# Patient Record
Sex: Female | Born: 1940 | Race: White | Hispanic: Refuse to answer | Smoking: Former smoker
Health system: Southern US, Community
[De-identification: ages and names within clinical notes are randomized; demographics above are authoritative.]

## PROBLEM LIST (undated history)

## (undated) DIAGNOSIS — K436 Other and unspecified ventral hernia with obstruction, without gangrene: Secondary | ICD-10-CM

## (undated) DIAGNOSIS — C801 Malignant (primary) neoplasm, unspecified: Secondary | ICD-10-CM

## (undated) DIAGNOSIS — Z87891 Personal history of nicotine dependence: Secondary | ICD-10-CM

## (undated) DIAGNOSIS — E785 Hyperlipidemia, unspecified: Secondary | ICD-10-CM

## (undated) DIAGNOSIS — K9419 Other complications of enterostomy: Secondary | ICD-10-CM

## (undated) DIAGNOSIS — Z8551 Personal history of malignant neoplasm of bladder: Secondary | ICD-10-CM

## (undated) DIAGNOSIS — K259 Gastric ulcer, unspecified as acute or chronic, without hemorrhage or perforation: Secondary | ICD-10-CM

## (undated) DIAGNOSIS — I10 Essential (primary) hypertension: Secondary | ICD-10-CM

## (undated) HISTORY — DX: Essential (primary) hypertension: I10

## (undated) HISTORY — PX: REVISION UROSTOMY CUTANEOUS: SUR1282

## (undated) HISTORY — DX: Hyperlipidemia, unspecified: E78.5

## (undated) HISTORY — DX: Other and unspecified ventral hernia with obstruction, without gangrene: K43.6

## (undated) HISTORY — DX: Other complications of enterostomy: K94.19

---

## 1993-10-18 HISTORY — PX: BLADDER REMOVAL: SHX567

## 2000-09-19 ENCOUNTER — Encounter: Admission: RE | Admit: 2000-09-19 | Discharge: 2000-09-19 | Payer: Self-pay | Admitting: Urology

## 2000-09-19 ENCOUNTER — Encounter: Payer: Self-pay | Admitting: Urology

## 2003-01-08 ENCOUNTER — Encounter: Payer: Self-pay | Admitting: Urology

## 2003-01-08 ENCOUNTER — Encounter: Admission: RE | Admit: 2003-01-08 | Discharge: 2003-01-08 | Payer: Self-pay | Admitting: Urology

## 2005-08-02 ENCOUNTER — Encounter: Admission: RE | Admit: 2005-08-02 | Discharge: 2005-08-02 | Payer: Self-pay | Admitting: Internal Medicine

## 2005-08-02 IMAGING — US US SOFT TISSUE HEAD/NECK
1 series · 14 of 25 positions shown · non-contrast
Comparison: none

CLINICAL DATA: Left thyroid nodule on exam.
 ULTRASOUND SOFT TISSUE HEAD AND NECK:
 Scans over the thyroid gland were performed.  The right lobe of thyroid measures 5.4 x 1.2 x 1.3 cm with the left lobe measuring 5.4 x 2.3 x 2.2 cm.  The isthmus measures 4 mm in thickness.  The thyroid gland is inhomogeneous diffusely and there are multiple, primarily solid lesions present.  The dominant lesion is in the mid left lobe measuring 2.5 x 2.2 x 2.0 cm. This lesion is echogenic, and biopsy is recommended.  Several smaller, more complex nodules are present bilaterally, one of the largest in the lateral left lobe measuring 1.4 x 0.9 x 0.6 cm.

[Series 1: unknown · 0.09mm/px · 14 of 62 slices shown]
[im 1/62]
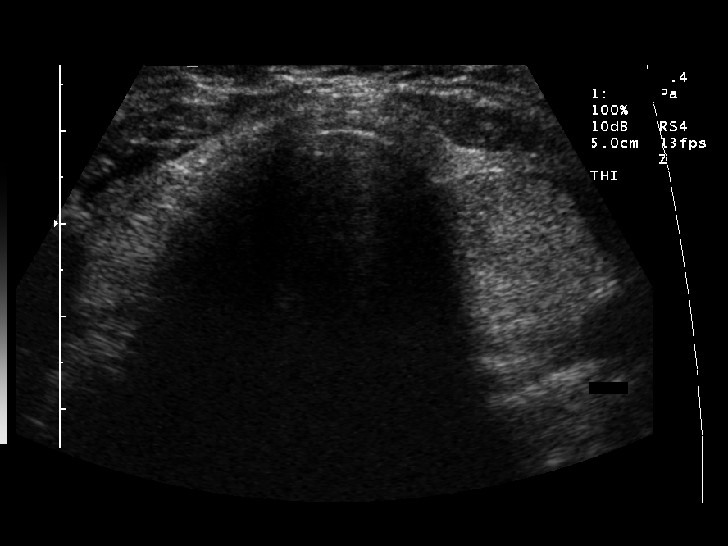
[im 6/62]
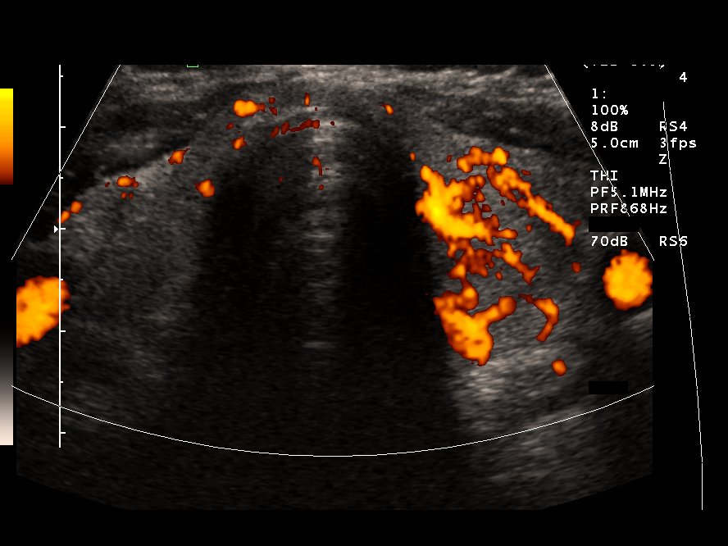
[im 11/62]
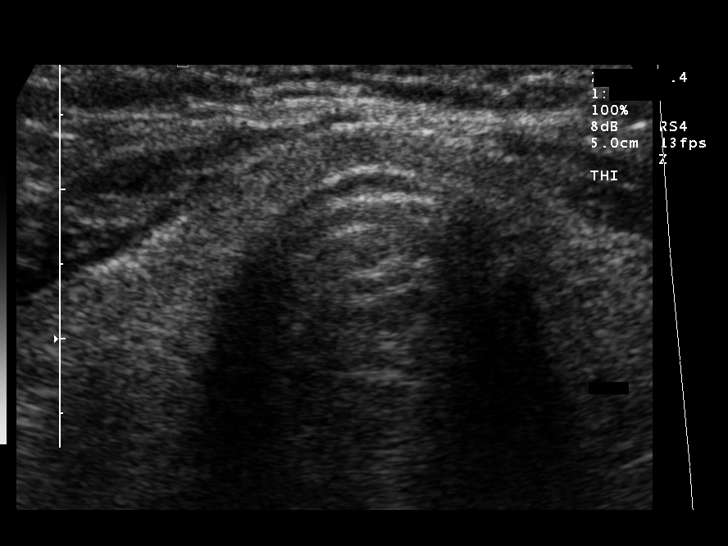
[im 16/62]
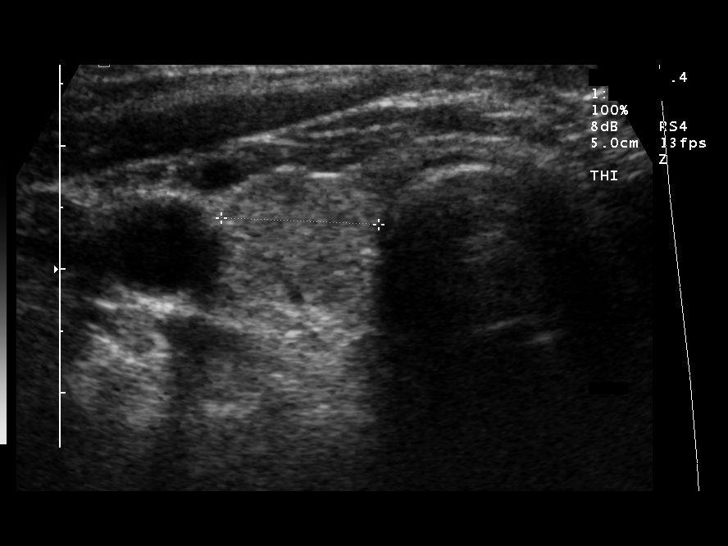
[im 21/62]
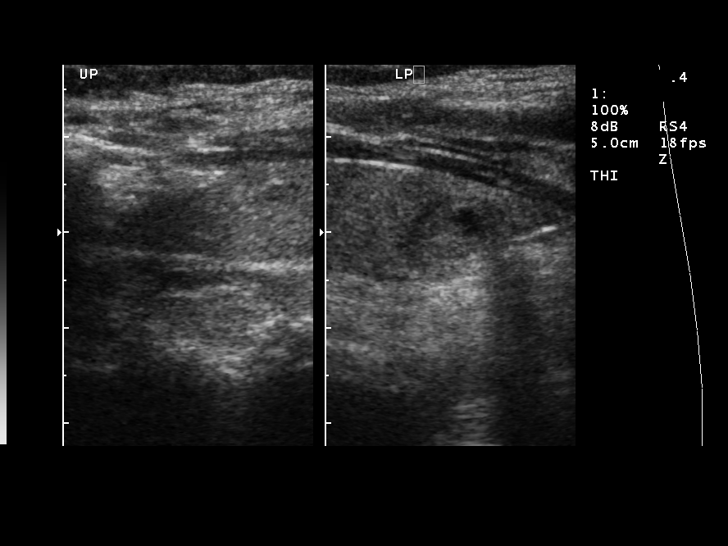
[im 23/62]
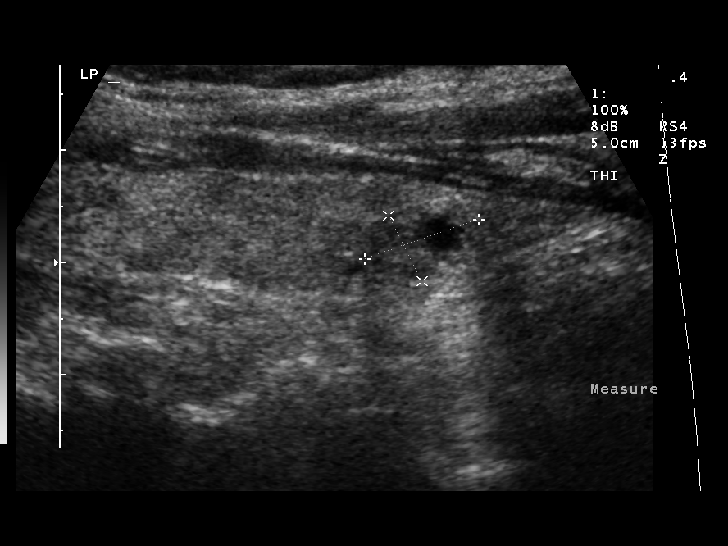
[im 28/62]
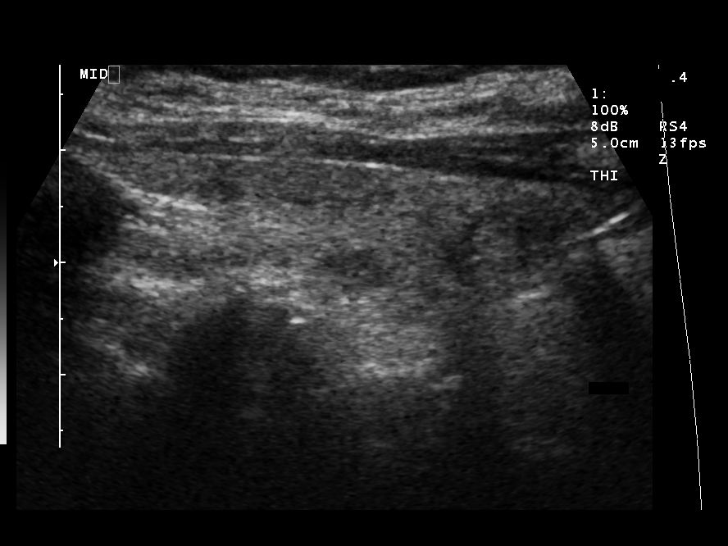
[im 34/62]
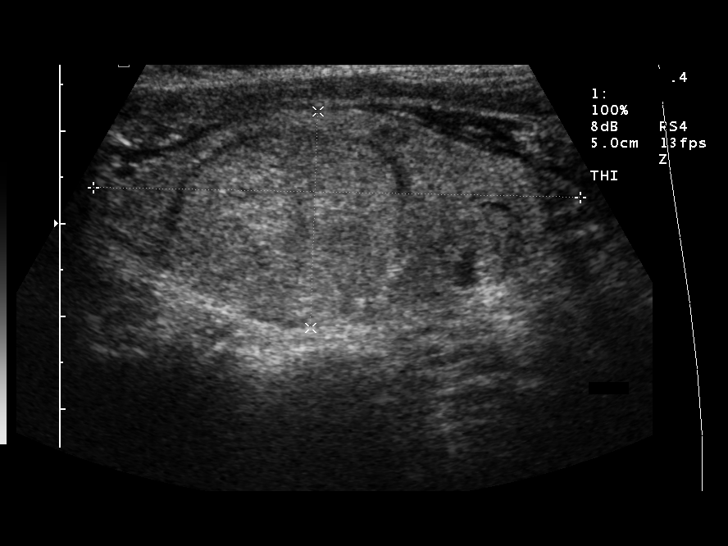
[im 39/62]
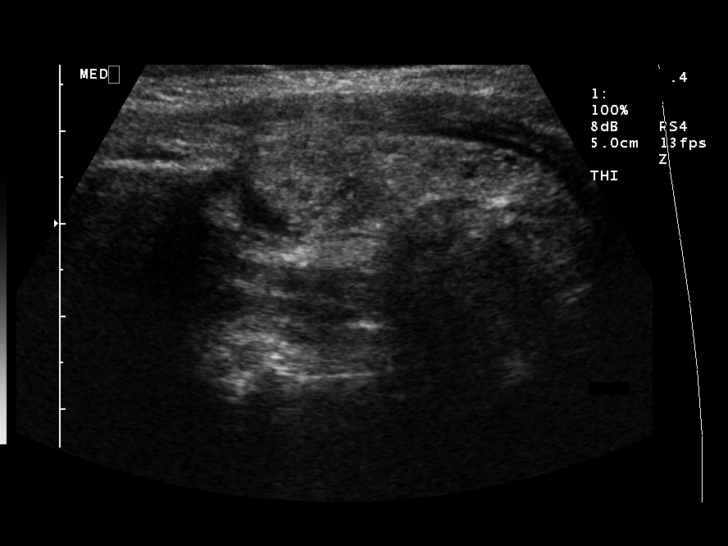
[im 41/62]
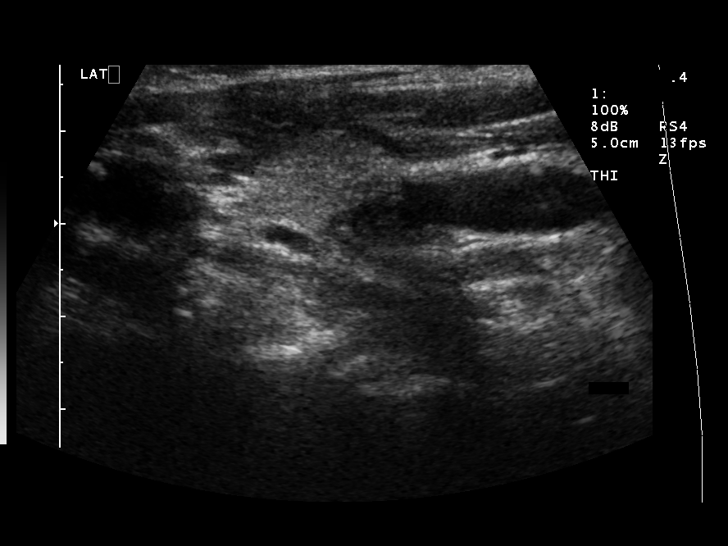
[im 46/62]
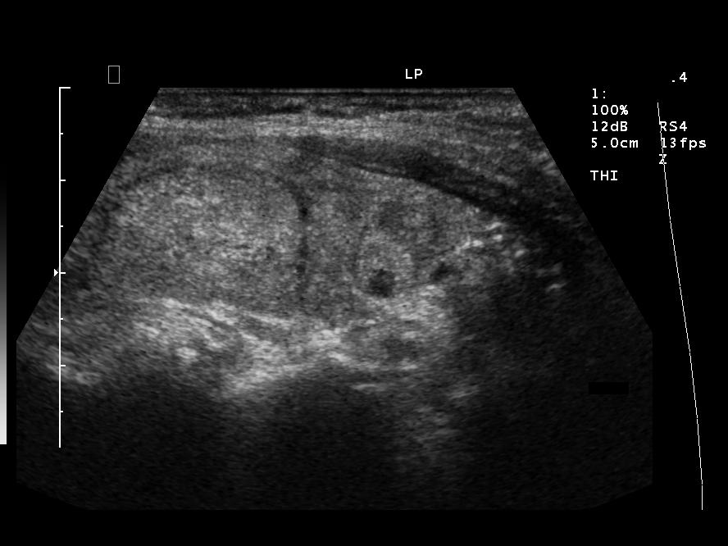
[im 51/62]
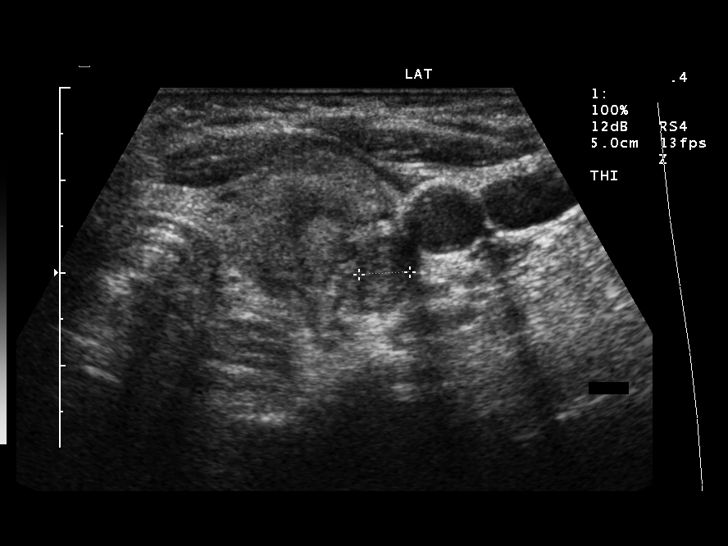
[im 56/62]
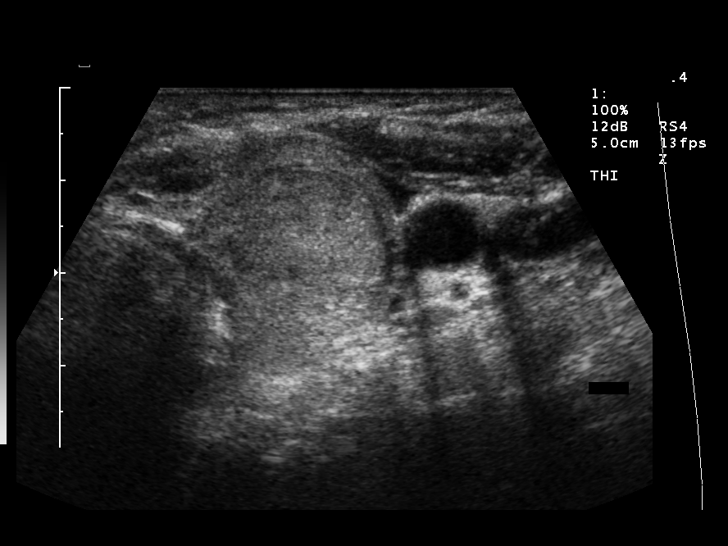
[im 62/62]
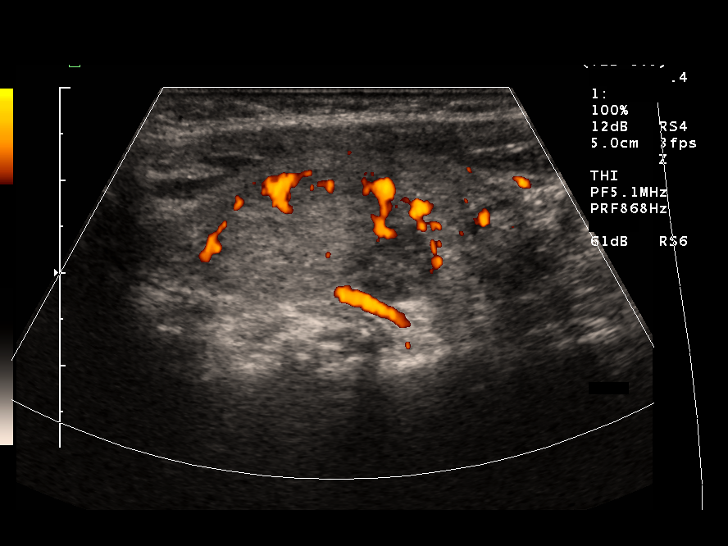

[14 of 25 positions shown; findings below may reference images not displayed]

IMPRESSION: 1.  Multiple thyroid nodules, the most dominant is solid in the left lobe measuring 2.5 x 2.2 x 2.0 cm.  Suggest biopsy of this dominant lesion.

## 2005-09-04 ENCOUNTER — Other Ambulatory Visit: Admission: RE | Admit: 2005-09-04 | Discharge: 2005-09-04 | Payer: Self-pay | Admitting: Interventional Radiology

## 2005-09-04 ENCOUNTER — Encounter: Admission: RE | Admit: 2005-09-04 | Discharge: 2005-09-04 | Payer: Self-pay | Admitting: Internal Medicine

## 2005-09-04 IMAGING — US US BIOPSY
1 series · 8 of 8 positions shown · non-contrast
Comparison: none

ULTRASOUND GUIDED FINE NEEDLE ASPIRATION OF DOMINANT LEFT THYROID LOBE NODULE:

[Series 1: unknown · 0.06mm/px · 8 of 8 slices shown]
[im 1/8]
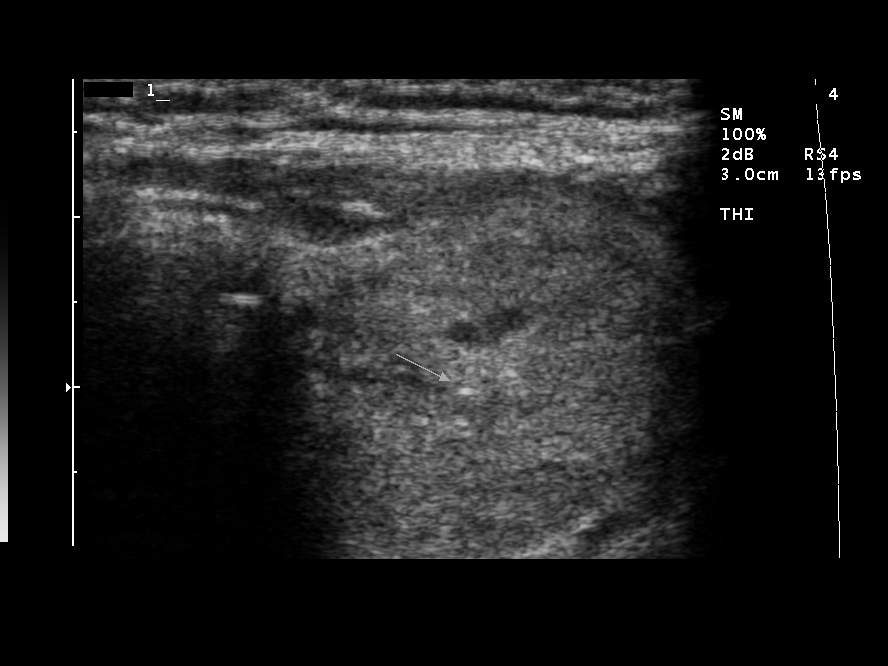
[im 2/8]
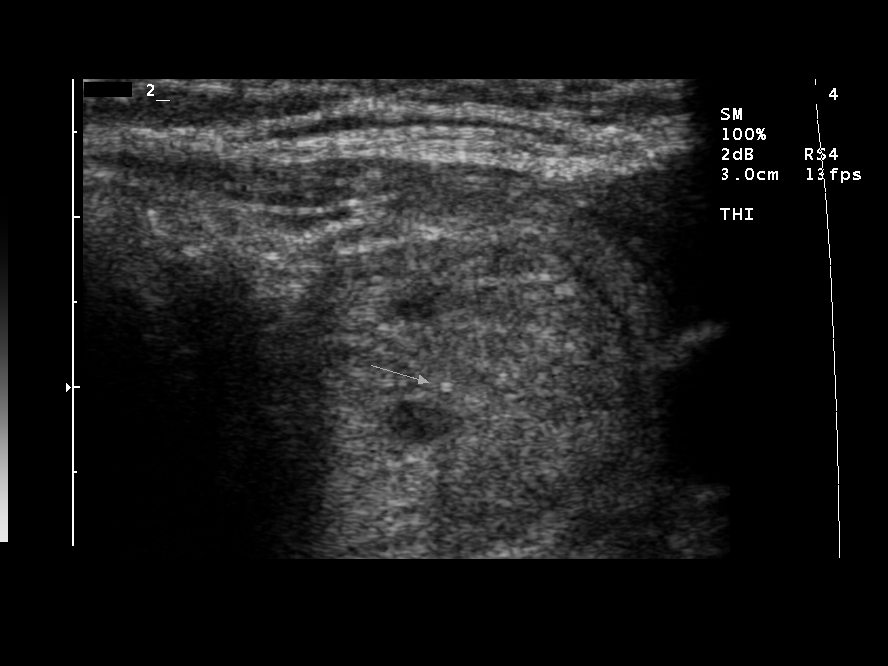
[im 3/8]
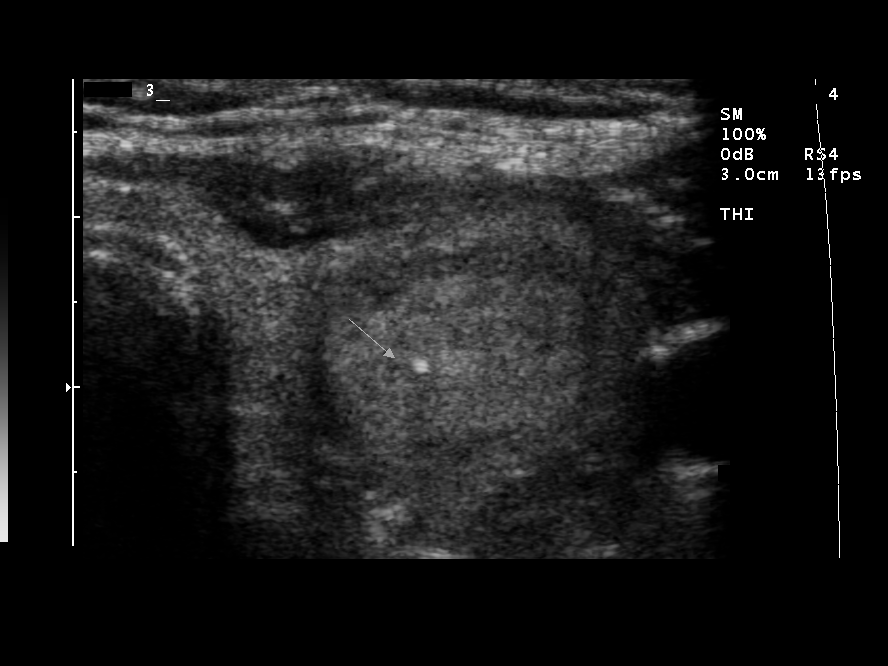
[im 4/8]
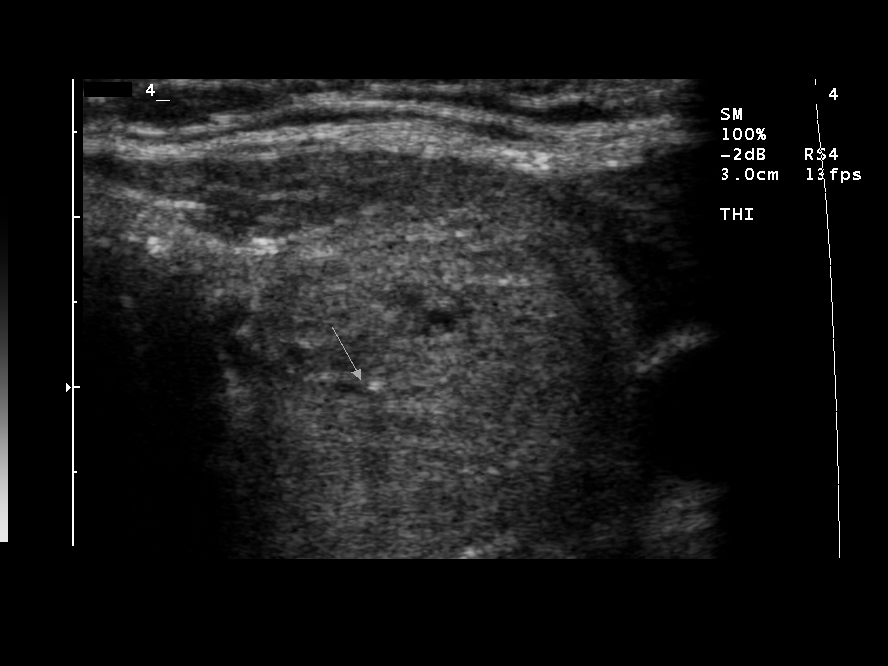
[im 5/8]
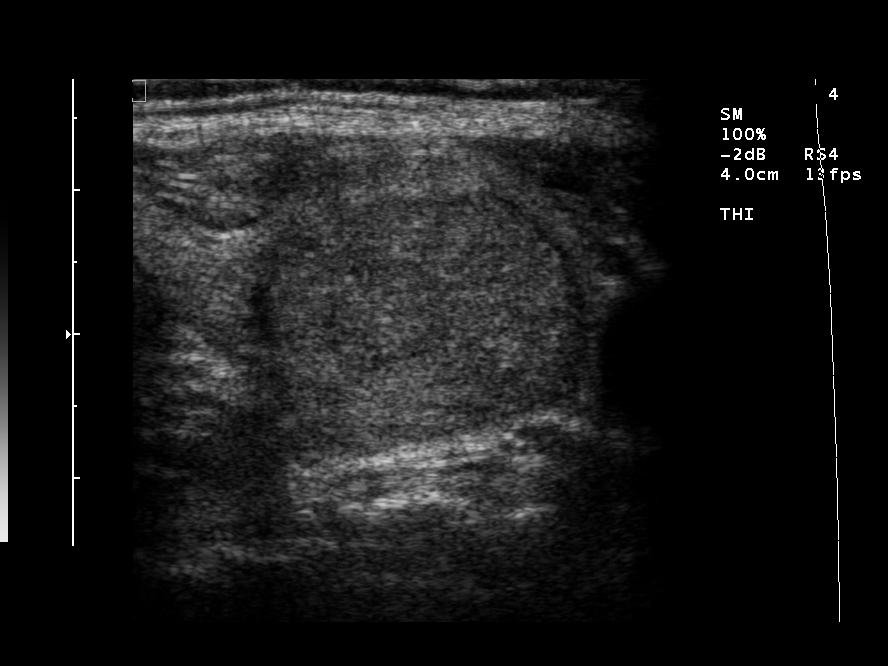
[im 6/8]
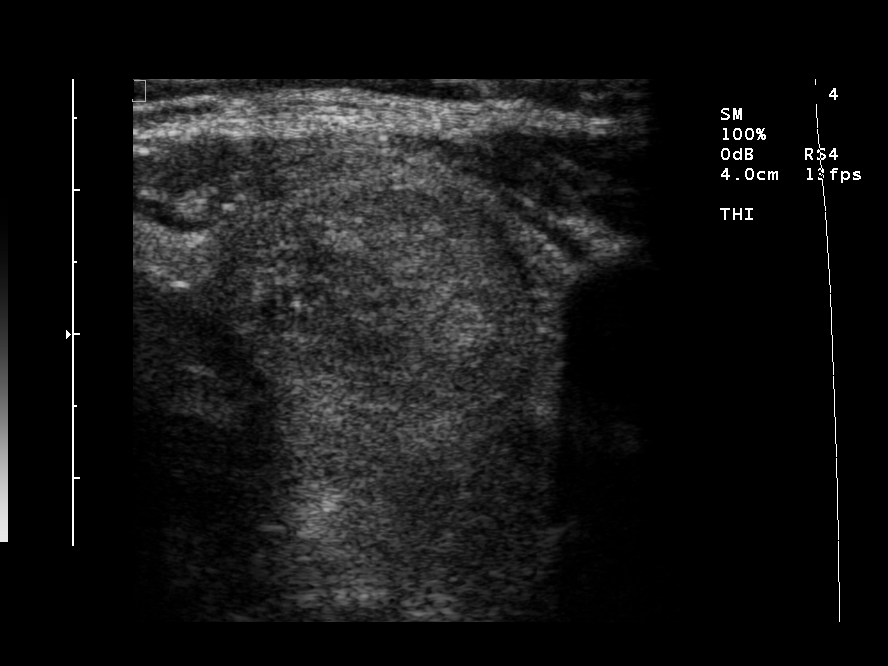
[im 7/8]
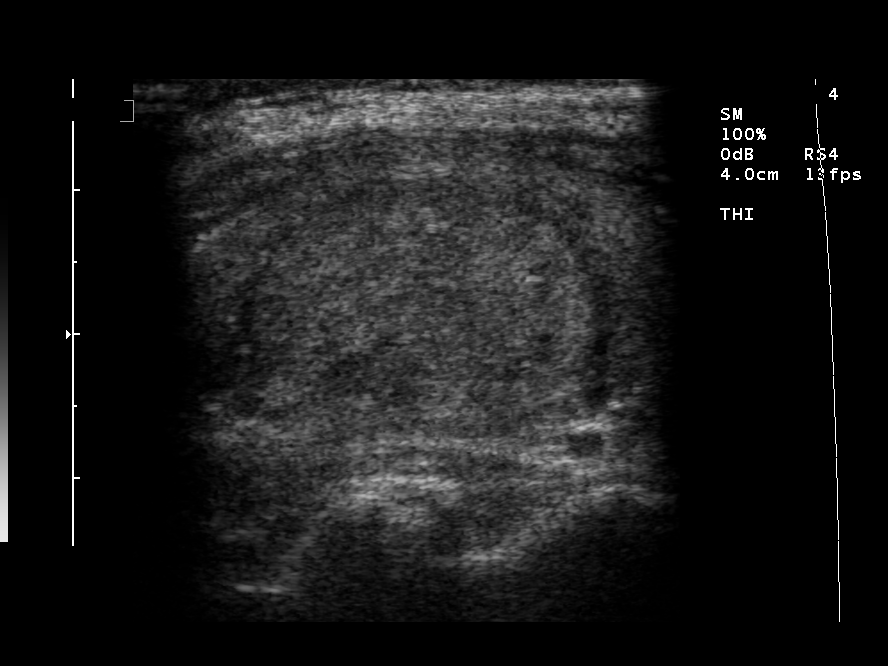
[im 8/8]
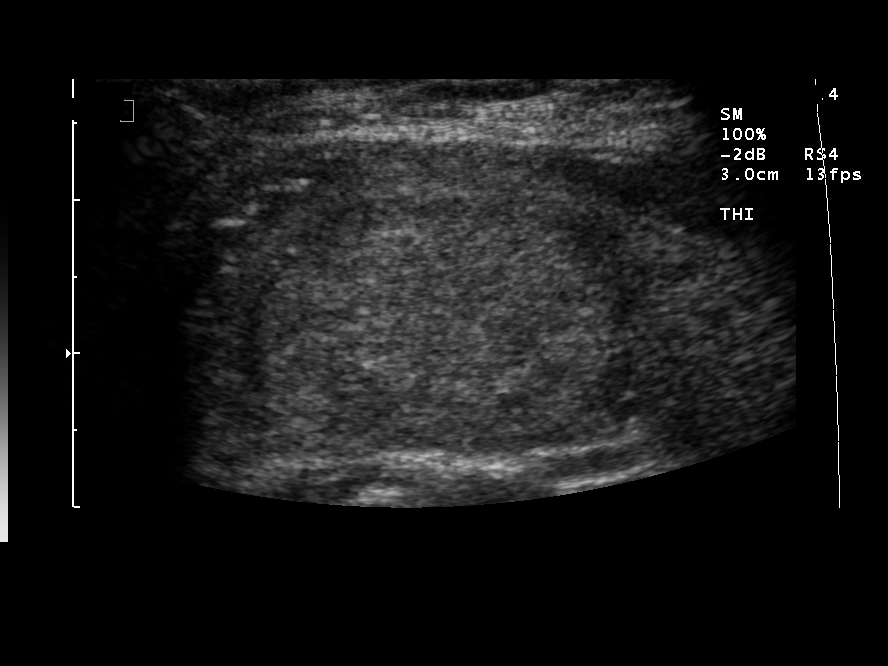

[8 of 8 positions shown; findings below may reference images not displayed]

FINDINGS: An ultrasound guided thyroid biopsy was thoroughly discussed with the patient and questions were answered.  The risks and benefits of the procedure were also delineated.  Risks specifically discussed included bleeding, bruising, infection, and risk of injury to adjacent blood vessels and nerves.  The patient understands and wishes to proceed.  Verbal and written consent was obtained.  
After the patient was prepped and draped in a normal sterile fashion ,1 percent Lidocaine was used for local anesthesia.  Under direct ultrasound guidance ,four passes were then made using 25 gauge hypodermic needles into the dominant left midpole thyroid lobe nodule.  Ultrasound imaging confirmed appropriate needle placement in the nodule.  The specimens were sent to cytology for further analysis.  The patient tolerated the procedure well.  There were no immediate complications.  No hematoma was identified postprocedure.
IMPRESSION: Successful ultrasound guided fine needle aspiration of a dominant left midpole thyroid lobe nodule as discussed above.

## 2011-12-04 ENCOUNTER — Encounter (HOSPITAL_COMMUNITY): Payer: Self-pay | Admitting: *Deleted

## 2011-12-04 ENCOUNTER — Inpatient Hospital Stay (HOSPITAL_COMMUNITY)
Admission: EM | Admit: 2011-12-04 | Discharge: 2011-12-11 | DRG: 348 | Disposition: A | Discharge status: Home / Self Care | Payer: Medicare Other | Attending: Surgery | Admitting: Surgery

## 2011-12-04 ENCOUNTER — Emergency Department (HOSPITAL_COMMUNITY): Payer: Medicare Other

## 2011-12-04 ENCOUNTER — Ambulatory Visit (INDEPENDENT_AMBULATORY_CARE_PROVIDER_SITE_OTHER): Payer: Medicare Other

## 2011-12-04 ENCOUNTER — Encounter: Payer: Self-pay | Admitting: Emergency Medicine

## 2011-12-04 ENCOUNTER — Encounter (INDEPENDENT_AMBULATORY_CARE_PROVIDER_SITE_OTHER): Payer: Self-pay | Admitting: Surgery

## 2011-12-04 DIAGNOSIS — K56609 Unspecified intestinal obstruction, unspecified as to partial versus complete obstruction: Secondary | ICD-10-CM | POA: Diagnosis present

## 2011-12-04 DIAGNOSIS — R112 Nausea with vomiting, unspecified: Secondary | ICD-10-CM | POA: Diagnosis present

## 2011-12-04 DIAGNOSIS — N289 Disorder of kidney and ureter, unspecified: Secondary | ICD-10-CM

## 2011-12-04 DIAGNOSIS — K660 Peritoneal adhesions (postprocedural) (postinfection): Secondary | ICD-10-CM

## 2011-12-04 DIAGNOSIS — I10 Essential (primary) hypertension: Secondary | ICD-10-CM | POA: Diagnosis present

## 2011-12-04 DIAGNOSIS — K560 Paralytic ileus: Secondary | ICD-10-CM | POA: Diagnosis not present

## 2011-12-04 DIAGNOSIS — Z906 Acquired absence of other parts of urinary tract: Secondary | ICD-10-CM

## 2011-12-04 DIAGNOSIS — K9419 Other complications of enterostomy: Secondary | ICD-10-CM | POA: Diagnosis present

## 2011-12-04 DIAGNOSIS — Z8551 Personal history of malignant neoplasm of bladder: Secondary | ICD-10-CM | POA: Insufficient documentation

## 2011-12-04 DIAGNOSIS — Z8711 Personal history of peptic ulcer disease: Secondary | ICD-10-CM

## 2011-12-04 DIAGNOSIS — E669 Obesity, unspecified: Secondary | ICD-10-CM | POA: Diagnosis present

## 2011-12-04 DIAGNOSIS — IMO0002 Reserved for concepts with insufficient information to code with codable children: Principal | ICD-10-CM | POA: Diagnosis present

## 2011-12-04 DIAGNOSIS — K430 Incisional hernia with obstruction, without gangrene: Secondary | ICD-10-CM | POA: Diagnosis present

## 2011-12-04 DIAGNOSIS — E860 Dehydration: Secondary | ICD-10-CM

## 2011-12-04 DIAGNOSIS — K435 Parastomal hernia without obstruction or  gangrene: Secondary | ICD-10-CM | POA: Diagnosis present

## 2011-12-04 DIAGNOSIS — K259 Gastric ulcer, unspecified as acute or chronic, without hemorrhage or perforation: Secondary | ICD-10-CM

## 2011-12-04 DIAGNOSIS — K436 Other and unspecified ventral hernia with obstruction, without gangrene: Secondary | ICD-10-CM | POA: Diagnosis present

## 2011-12-04 HISTORY — DX: Parastomal hernia without obstruction or gangrene: K43.5

## 2011-12-04 HISTORY — DX: Malignant (primary) neoplasm, unspecified: C80.1

## 2011-12-04 HISTORY — DX: Personal history of malignant neoplasm of bladder: Z85.51

## 2011-12-04 HISTORY — DX: Gastric ulcer, unspecified as acute or chronic, without hemorrhage or perforation: K25.9

## 2011-12-04 HISTORY — DX: Personal history of nicotine dependence: Z87.891

## 2011-12-04 LAB — URINALYSIS, ROUTINE W REFLEX MICROSCOPIC
Ketones, ur: 15 mg/dL — AB
Nitrite: NEGATIVE
Protein, ur: 30 mg/dL — AB
Urobilinogen, UA: 1 mg/dL (ref 0.0–1.0)
pH: 7.5 (ref 5.0–8.0)

## 2011-12-04 LAB — DIFFERENTIAL
Basophils Relative: 0 % (ref 0–1)
Eosinophils Absolute: 0.1 10*3/uL (ref 0.0–0.7)
Monocytes Absolute: 0.6 10*3/uL (ref 0.1–1.0)
Neutrophils Relative %: 84 % — ABNORMAL HIGH (ref 43–77)

## 2011-12-04 LAB — POCT I-STAT, CHEM 8
Calcium, Ion: 1.19 mmol/L (ref 1.12–1.32)
HCT: 46 % (ref 36.0–46.0)
TCO2: 32 mmol/L (ref 0–100)

## 2011-12-04 LAB — CBC
Hemoglobin: 13.8 g/dL (ref 12.0–15.0)
MCH: 29.2 pg (ref 26.0–34.0)
MCHC: 32.7 g/dL (ref 30.0–36.0)

## 2011-12-04 LAB — URINE MICROSCOPIC-ADD ON

## 2011-12-04 IMAGING — CT CT ABD-PELV W/O CM
2 of 4 series · 17 of 46 positions shown, 19 images · non-contrast
Comparison: Plain films [DATE]

CLINICAL DATA: Nausea, vomiting.  History of bladder cancer and
cystectomy.

CT ABDOMEN AND PELVIS WITHOUT CONTRAST
TECHNIQUE: Multidetector CT imaging of the abdomen and pelvis was
performed following the standard protocol without intravenous
contrast.

[Series 2: abd/pel w/o · axial · non-contrast · 0.74mm/px · z∈[-479,-84]mm · 14 of 87 slices shown, 16 images]
[im 4/87  soft-tissue]
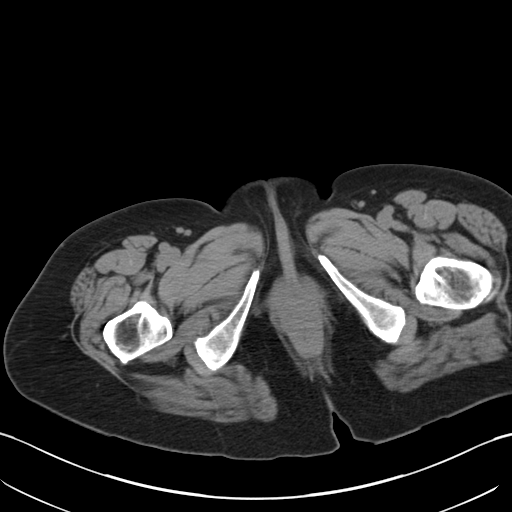
[im 4/87  bone]
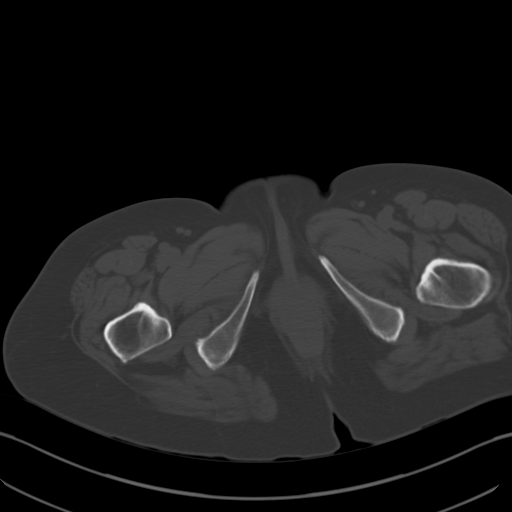
[im 11/87  soft-tissue]
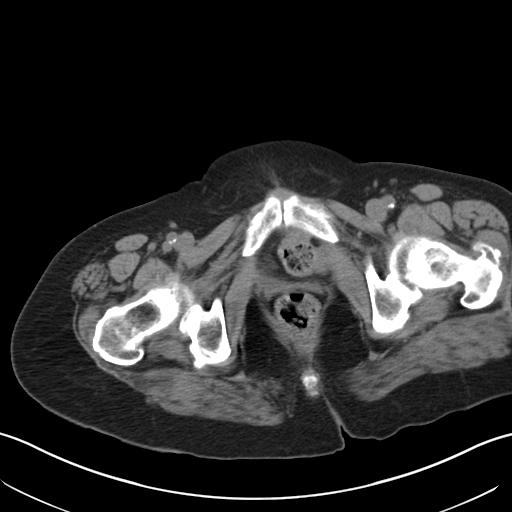
[im 18/87  soft-tissue]
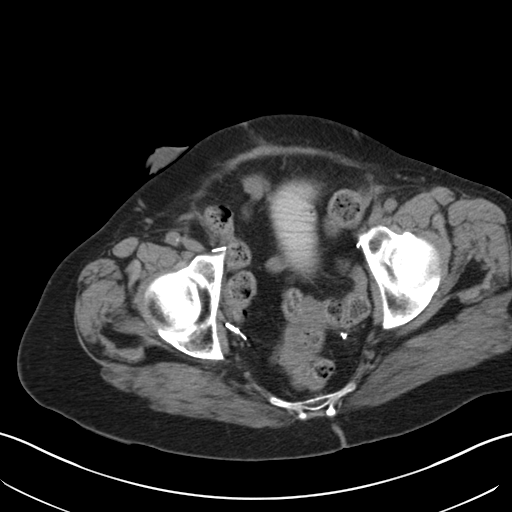
[im 25/87  soft-tissue]
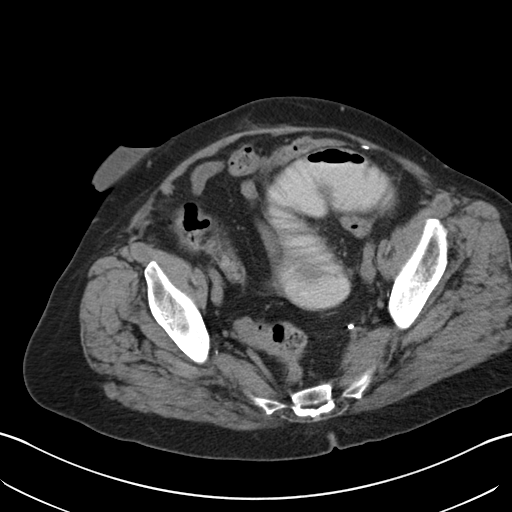
[im 28/87  soft-tissue]
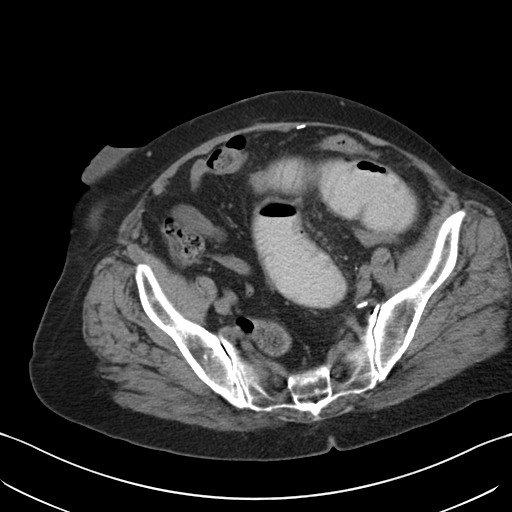
[im 35/87  soft-tissue]
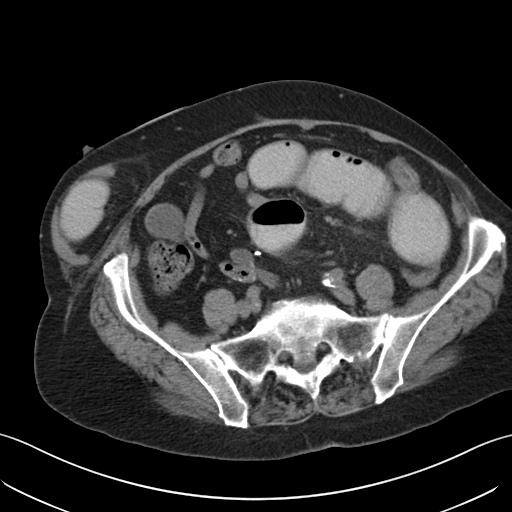
[im 42/87  soft-tissue]
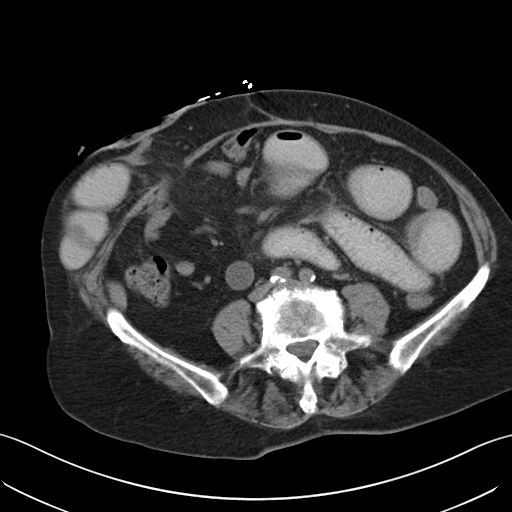
[im 45/87  soft-tissue]
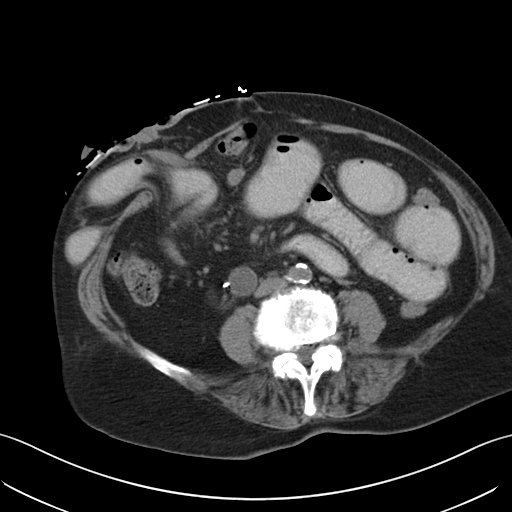
[im 52/87  soft-tissue]
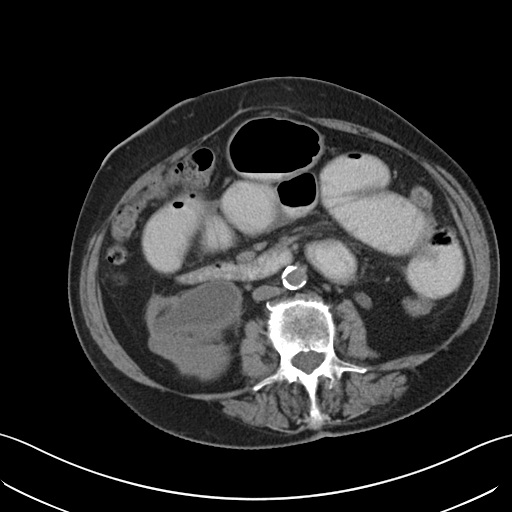
[im 52/87  bone]
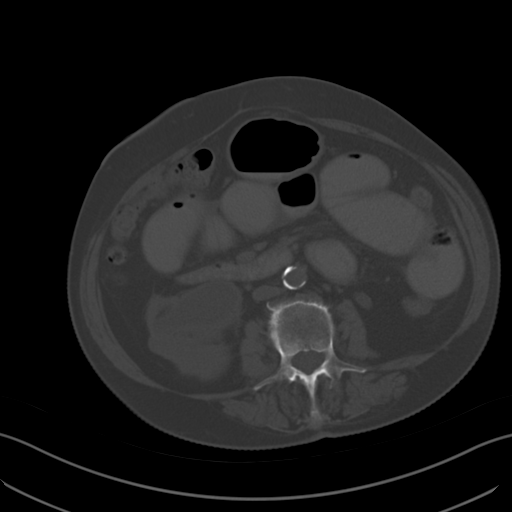
[im 59/87  soft-tissue]
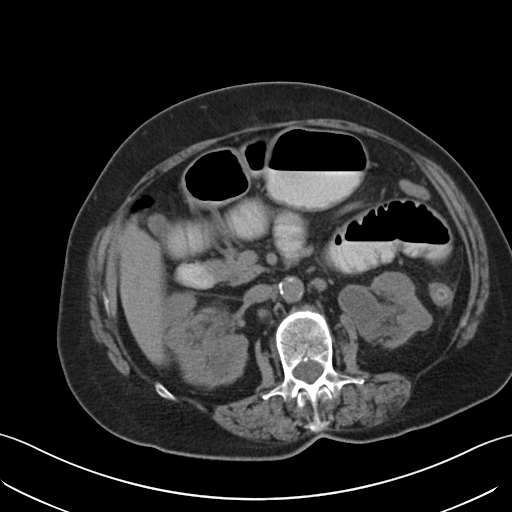
[im 66/87  soft-tissue]
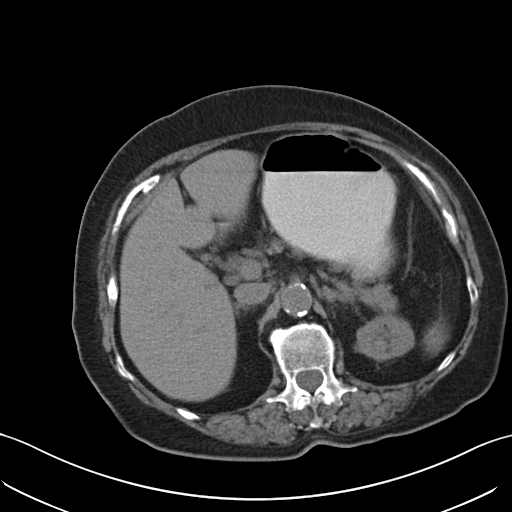
[im 69/87  soft-tissue]
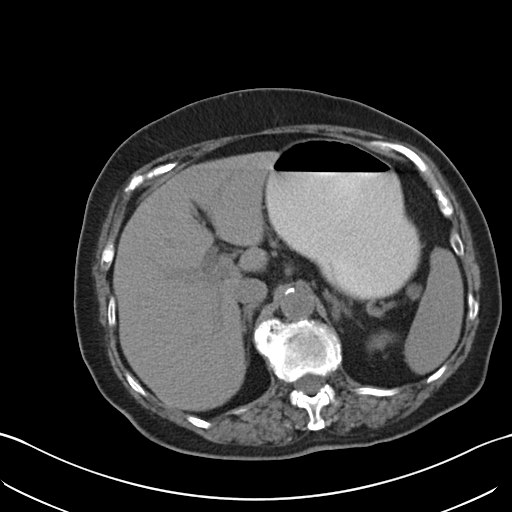
[im 76/87  soft-tissue]
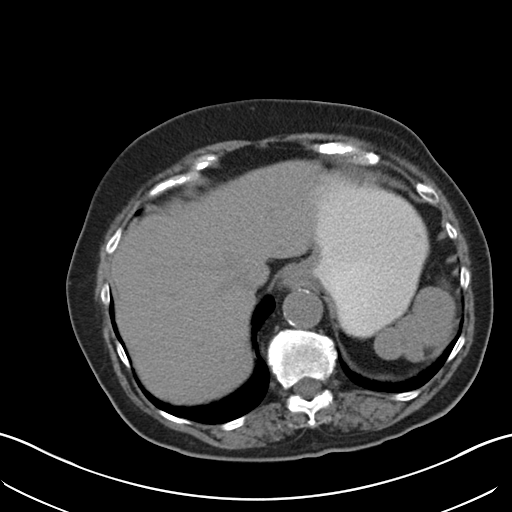
[im 83/87  soft-tissue]
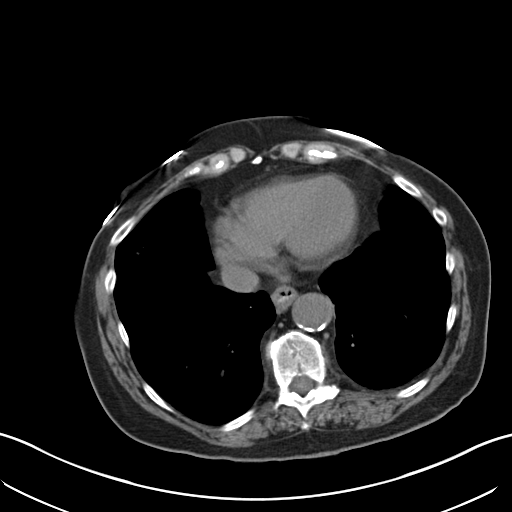

[Series 5: coronal · coronal · 0.74mm/px · 3 of 51 slices shown]
[im 17/51  soft-tissue]
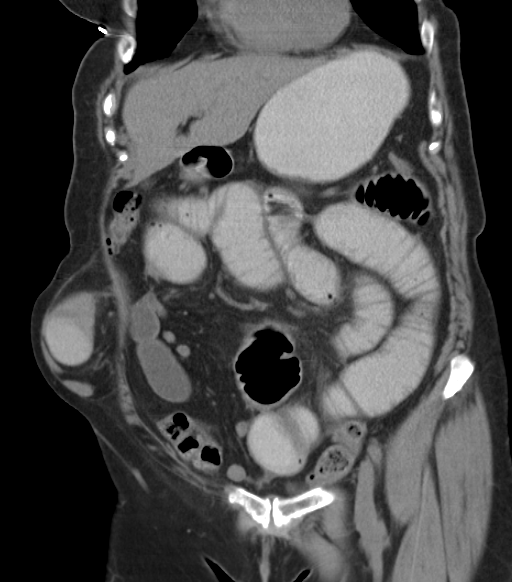
[im 23/51  soft-tissue]
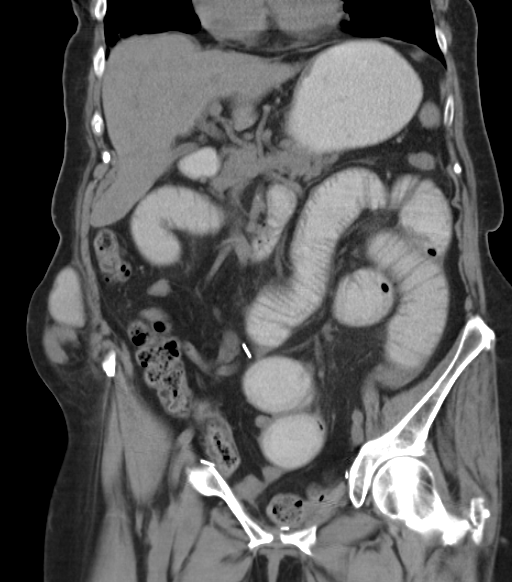
[im 28/51  soft-tissue]
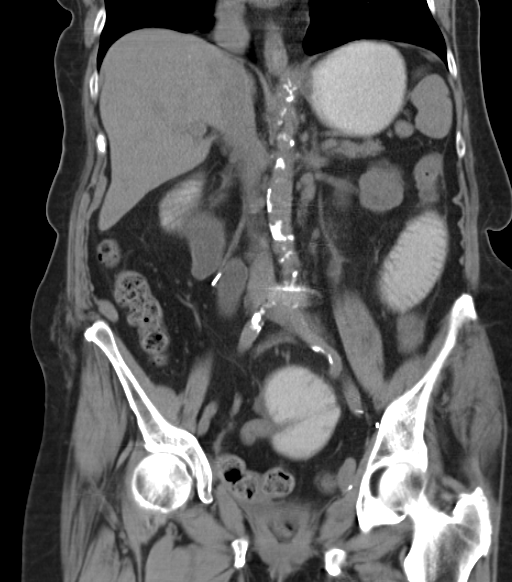

[17 of 46 positions shown; findings below may reference images not displayed]

FINDINGS: Ground-glass opacities noted in the right lung base in
both the right middle lobe and right lower lobe.  I suspect this
represents scarring or atelectasis.  No effusions.  Heart is normal
size.

The patient is status post prior cystectomy.  There is a right
lower quadrant ileal conduit.  Parastomal herniation of small bowel
loops through the anterior abdominal wall defect for the ileal
conduit. Small bowel loops in the abdomen and pelvis are dilated
with decompressed distal small bowel.  The caliber change appears
to be within the parastomal hernia.  Findings compatible with small
bowel obstruction.

Liver, spleen, pancreas, adrenals are unremarkable.  Gallbladder is
contracted.  The kidneys are deformed, likely related to chronic
reflux following cystectomy and ileal conduit formation.  Stomach
and large bowel are unremarkable.
IMPRESSION: Small bowel obstruction.  There is a parastomal hernia in the right
lower quadrant through the anterior abdominal wall defect for the
ileal conduit.  Caliber change within the small bowel loops noted
within this hernia sac.

Prior cystectomy.

## 2011-12-04 MED ORDER — PANTOPRAZOLE SODIUM 40 MG IV SOLR
40.0000 mg | Freq: Two times a day (BID) | INTRAVENOUS | Status: DC
  Administered 2011-12-05 – 2011-12-09 (×10): 40 mg via INTRAVENOUS
  Filled 2011-12-04 (×12): qty 40

## 2011-12-04 MED ORDER — SODIUM CHLORIDE 0.9 % IV SOLN
1.0000 g | INTRAVENOUS | Status: DC
  Administered 2011-12-05 – 2011-12-08 (×4): 1 g via INTRAVENOUS
  Filled 2011-12-04 (×5): qty 1

## 2011-12-04 MED ORDER — GUAIFENESIN-DM 100-10 MG/5ML PO SYRP: 15.0000 mL | ORAL_SOLUTION | ORAL | Status: DC | PRN

## 2011-12-04 MED ORDER — MENTHOL 3 MG MT LOZG
1.0000 | LOZENGE | OROMUCOSAL | Status: DC | PRN
  Filled 2011-12-04: qty 9

## 2011-12-04 MED ORDER — LACTATED RINGERS IV BOLUS (SEPSIS): 1000.0000 mL | Freq: Four times a day (QID) | INTRAVENOUS | Status: AC | PRN

## 2011-12-04 MED ORDER — MAGIC MOUTHWASH
15.0000 mL | Freq: Four times a day (QID) | ORAL | Status: DC | PRN
  Filled 2011-12-04: qty 15

## 2011-12-04 MED ORDER — ONDANSETRON HCL 4 MG/2ML IJ SOLN
4.0000 mg | Freq: Once | INTRAMUSCULAR | Status: AC
  Administered 2011-12-04: 4 mg via INTRAVENOUS
  Filled 2011-12-04: qty 2

## 2011-12-04 MED ORDER — WITCH HAZEL-GLYCERIN EX PADS
1.0000 "application " | MEDICATED_PAD | CUTANEOUS | Status: DC | PRN
  Filled 2011-12-04: qty 100

## 2011-12-04 MED ORDER — METOPROLOL TARTRATE 1 MG/ML IV SOLN
5.0000 mg | Freq: Four times a day (QID) | INTRAVENOUS | Status: DC | PRN
  Filled 2011-12-04: qty 5

## 2011-12-04 MED ORDER — HYDROMORPHONE HCL PF 1 MG/ML IJ SOLN
0.5000 mg | INTRAMUSCULAR | Status: DC | PRN
  Administered 2011-12-05: 2 mg via INTRAVENOUS
  Administered 2011-12-05 (×5): 1 mg via INTRAVENOUS
  Administered 2011-12-06: 0.5 mg via INTRAVENOUS
  Administered 2011-12-06: 1 mg via INTRAVENOUS
  Administered 2011-12-06: 0.5 mg via INTRAVENOUS
  Administered 2011-12-06: 1 mg via INTRAVENOUS
  Administered 2011-12-06 – 2011-12-07 (×2): 0.5 mg via INTRAVENOUS
  Administered 2011-12-07 (×3): 1 mg via INTRAVENOUS
  Filled 2011-12-04 (×5): qty 1
  Filled 2011-12-04: qty 2
  Filled 2011-12-04 (×8): qty 1

## 2011-12-04 MED ORDER — SODIUM CHLORIDE 0.9 % IV SOLN: INTRAVENOUS | Status: DC

## 2011-12-04 MED ORDER — DEXTROSE IN LACTATED RINGERS 5 % IV SOLN
INTRAVENOUS | Status: DC
  Administered 2011-12-05 – 2011-12-06 (×3): via INTRAVENOUS

## 2011-12-04 MED ORDER — ACETAMINOPHEN 650 MG RE SUPP: 650.0000 mg | Freq: Four times a day (QID) | RECTAL | Status: DC | PRN

## 2011-12-04 MED ORDER — SODIUM CHLORIDE 0.9 % IV BOLUS (SEPSIS)
500.0000 mL | Freq: Once | INTRAVENOUS | Status: AC
  Administered 2011-12-04: 500 mL via INTRAVENOUS

## 2011-12-04 MED ORDER — DIPHENHYDRAMINE HCL 50 MG/ML IJ SOLN: 12.5000 mg | Freq: Four times a day (QID) | INTRAMUSCULAR | Status: DC | PRN

## 2011-12-04 MED ORDER — ALBUTEROL SULFATE (5 MG/ML) 0.5% IN NEBU: 2.5000 mg | INHALATION_SOLUTION | Freq: Four times a day (QID) | RESPIRATORY_TRACT | Status: DC | PRN

## 2011-12-04 MED ORDER — PROMETHAZINE HCL 25 MG/ML IJ SOLN
12.5000 mg | Freq: Four times a day (QID) | INTRAMUSCULAR | Status: DC | PRN
  Filled 2011-12-04: qty 1

## 2011-12-04 MED ORDER — BISACODYL 10 MG RE SUPP: 10.0000 mg | Freq: Two times a day (BID) | RECTAL | Status: DC | PRN

## 2011-12-04 MED ORDER — LIP MEDEX EX OINT
1.0000 "application " | TOPICAL_OINTMENT | Freq: Two times a day (BID) | CUTANEOUS | Status: DC
  Administered 2011-12-05 – 2011-12-10 (×10): 1 via TOPICAL
  Filled 2011-12-04: qty 7

## 2011-12-04 MED ORDER — HYDROMORPHONE BOLUS VIA INFUSION: 0.5000 mg | INTRAVENOUS | Status: DC | PRN

## 2011-12-04 MED ORDER — PHENOL 1.4 % MT LIQD
2.0000 | OROMUCOSAL | Status: DC | PRN
  Filled 2011-12-04: qty 177

## 2011-12-04 MED ORDER — ONDANSETRON HCL 4 MG/2ML IJ SOLN: 4.0000 mg | Freq: Four times a day (QID) | INTRAMUSCULAR | Status: DC | PRN

## 2011-12-04 NOTE — ED Notes (Signed)
Pt states was sent for possible intestinal blockage, n/v and cramping.

## 2011-12-04 NOTE — Anesthesia Preprocedure Evaluation (Addendum)
Anesthesia Evaluation  Patient identified by MRN, date of birth, ID band Patient awake    Reviewed: Allergy & Precautions, H&P , NPO status , Patient's Chart, lab work & pertinent test results, reviewed documented beta blocker date and time   Airway Mallampati: II TM Distance: >3 FB Neck ROM: Full    Dental  (+) Dental Advisory Given   Pulmonary neg pulmonary ROS,  clear to auscultation        Cardiovascular hypertension, Pt. on medications Regular Normal Denies cardiac symptoms   Neuro/Psych Negative Neurological ROS  Negative Psych ROS   GI/Hepatic Neg liver ROS, SBO   Endo/Other  Negative Endocrine ROS  Renal/GU Renal diseaseCr 1.70   S/p cystectomy    Musculoskeletal negative musculoskeletal ROS (+)   Abdominal   Peds negative pediatric ROS (+)  Hematology negative hematology ROS (+)   Anesthesia Other Findings Front caps  Reproductive/Obstetrics negative OB ROS                          Anesthesia Physical Anesthesia Plan  ASA: III and Emergent  Anesthesia Plan: General   Post-op Pain Management:    Induction: Intravenous, Rapid sequence and Cricoid pressure planned  Airway Management Planned: Oral ETT  Additional Equipment:   Intra-op Plan:   Post-operative Plan: Extubation in OR  Informed Consent: I have reviewed the patients History and Physical, chart, labs and discussed the procedure including the risks, benefits and alternatives for the proposed anesthesia with the patient or authorized representative who has indicated his/her understanding and acceptance.     Plan Discussed with: CRNA and Surgeon  Anesthesia Plan Comments:         Anesthesia Quick Evaluation

## 2011-12-04 NOTE — ED Notes (Signed)
800 green bilius specimen noted in canister at this time

## 2011-12-04 NOTE — Consult Note (Addendum)
Sara Aguirre  1941-08-30 [id number]  Patient Care Team: Leandrew Koyanagi, MD as PCP - General (Family Medicine) Bernestine Amass, MD as Consulting Physician (Urology)  This patient is a 70 y.o.female who presents today for surgical evaluation at the request of Dr. Alvino Chapel, [facility] ED.   Her violation: Small bowel obstruction. Mass at stoma/ileal conduit. Question of incarcerated parastomal hernia  Patient is a pleasant cancer survivor, dsease free for some time. She had a cystectomy for bladder cancer done by Drs Reeves Dam 18 years ago. She's not had any other surgeries. She normally has a bowel movement every day.   Over the past week, she's had worsening abdominal pain and nausea and vomiting. It became more intense. She came in emergency room tonight. No fevers chills nor sweats. No sick contacts or travel history. Making urine but less. . Based on concerns she came emergency room. Evaluation by CAT scan shows a high-grade partial small bowel obstruction with a transition zone within the ileal conduit. Dr. Alvino Chapel cannot reduce it. He requested surgical evaluation.  Patient Active Problem List  Diagnoses  . History of bladder cancer, s/p cystectomy with ileal conduit    Past Medical History  Diagnosis Date  . Ulcer, gastric, acute or chronic   . Cancer     bladder, s/p cystectomy with ileal conduit  . History of bladder cancer     Past Surgical History  Procedure Date  . Bladder removal     History   Social History  . Marital Status: Widowed    Spouse Name: N/A    Number of Children: N/A  . Years of Education: N/A   Occupational History  . Not on file.   Social History Main Topics  . Smoking status: Former Research scientist (life sciences)  . Smokeless tobacco: Not on file  . Alcohol Use: No  . Drug Use: No  . Sexually Active:    Other Topics Concern  . Not on file   Social History Narrative  . No narrative on file    History reviewed. No pertinent family history.  Current  facility-administered medications:0.9 %  sodium chloride infusion, , Intravenous, Continuous, Nathan R. Pickering, MD, Last Rate: 125 mL/hr at 12/04/11 1831;  ondansetron [facility]) injection 4 mg, 4 mg, Intravenous, Once, Ovid Curd R. Pickering, MD, 4 mg at 12/04/11 1829;  sodium chloride 0.9 % bolus 500 mL, 500 mL, Intravenous, Once, [employer]. Pickering, MD, 500 mL at 12/04/11 1830 Current outpatient prescriptions:Coenzyme Q10 (COQ10 PO), Take 1 tablet by mouth daily.  , Disp: , Rfl: ;  fish oil-omega-3 fatty acids 1000 MG capsule, Take 1 g by mouth daily.  , Disp: , Rfl: ;  losartan-hydrochlorothiazide (HYZAAR) 50-12.5 MG per tablet, Take 1 tablet by mouth daily.  , Disp: , Rfl:   Allergies  Allergen Reactions  . Imodium A-D Rash    Vital signs:  Temp:  [98.1 F (36.7 C)-98.2 F (36.8 C)] 98.1 F (36.7 C) (12/17 1953) Pulse Rate:  [85-113] 85  (12/17 1953) Resp:  [16-18] 18  (12/17 1953) BP: (153-165)/(86-97) 165/86 mmHg (12/17 1953) SpO2:  [96 %-98 %] 96 % (12/17 1953)    Flatus: no BM: no  General:  No fevers, chills, sweats.  Weight stable Eyes:  No vision changes, No discharge HENT:  No sore throats, nasal drainage Lymph: No neck swelling, No bruising easily Pulmonary:  No cough, productive sputum CV: No orthopnea, PND  Patient walks 60 minutes for about 2 miles without difficulty.  No exertional chest/neck/shoulder/arm  pain.  GI: No personal nor family history of GI/colon cancer, inflammatory bowel disease, irritable bowel syndrome, allergy such as Celiac Sprue, dietary/dairy problems, colitis, ulcers nor gastritis.    No recent sick contacts/gastroenteritis.  No travel outside the country.  No changes in diet.  Renal: No UTIs, No hematuria Genital:  No drainage, bleeding, masses Musculoskeletal: No joint pain, myalgias     Physical Exam:  General: Pt awake/alert/oriented x4 in no acute distress but sore. Eyes: PERRL, normal EOM.  Sclera clear.  No icterus Neuro: CN  II-XII intact w/o focal sensory/motor deficits. Lymph: No head/neck/groin lymphadenopathy Psych:  No delerium/psychosis/paranoia.  Smiling HENT: Normocephalic, Mucus membranes moist.  No thrush Neck: Supple, No tracheal deviation Chest: Clear No chest wall pain w good excursion CV:  Pulses intact.  Regular rhythm Abdomen: Soft, Nontender/Nondistended.  Incarcerated hernia = moderate lateral mass @ RLQ ileal conduit.  Urine in urostomy conduit Ext:  SCDs BLE.  No mjr edema.  No cyanosis Skin: No petechiae / purpurae  Results:   Labs: Results for orders placed during the hospital encounter of 12/04/11 (from the past 48 hour(s))  CBC     Status: Normal   Collection Time   12/04/11  5:56 PM      Component Value Range Comment   WBC 9.2  4.0 - 10.5 (K/uL)    RBC 4.73  3.87 - 5.11 (MIL/uL)    Hemoglobin 13.8  12.0 - 15.0 (g/dL)    HCT 42.2  36.0 - 46.0 (%)    MCV 89.2  78.0 - 100.0 (fL)    MCH 29.2  26.0 - 34.0 (pg)    MCHC 32.7  30.0 - 36.0 (g/dL)    RDW 12.7  11.5 - 15.5 (%)    Platelets    150 - 400 (K/uL)    Value: PLATELET CLUMPS NOTED ON SMEAR, COUNT APPEARS DECREASED  DIFFERENTIAL     Status: Abnormal   Collection Time   12/04/11  5:56 PM      Component Value Range Comment   Neutrophils Relative 84 (*) 43 - 77 (%)    Lymphocytes Relative 9 (*) 12 - 46 (%)    Monocytes Relative 6  3 - 12 (%)    Eosinophils Relative 1  0 - 5 (%)    Basophils Relative 0  0 - 1 (%)    Neutro Abs 7.7  1.7 - 7.7 (K/uL)    Lymphs Abs 0.8  0.7 - 4.0 (K/uL)    Monocytes Absolute 0.6  0.1 - 1.0 (K/uL)    Eosinophils Absolute 0.1  0.0 - 0.7 (K/uL)    Basophils Absolute 0.0  0.0 - 0.1 (K/uL)    WBC Morphology INCREASED BANDS (>20% BANDS)      Smear Review LARGE PLATELETS PRESENT   PLATELET CLUMPS NOTED ON SMEAR  POCT I-STAT, CHEM 8     Status: Abnormal   Collection Time   12/04/11  6:17 PM      Component Value Range Comment   Sodium 142  135 - 145 (mEq/L)    Potassium 3.6  3.5 - 5.1 (mEq/L)     Chloride 101  96 - 112 (mEq/L)    BUN 32 (*) 6 - 23 (mg/dL)    Creatinine, Ser 1.70 (*) 0.50 - 1.10 (mg/dL)    Glucose, Bld 127 (*) 70 - 99 (mg/dL)    Calcium, Ion 1.19  1.12 - 1.32 (mmol/L)    TCO2 32  0 - 100 (mmol/L)  Hemoglobin 15.6 (*) 12.0 - 15.0 (g/dL)    HCT 46.0  36.0 - 46.0 (%)   URINALYSIS, ROUTINE W REFLEX MICROSCOPIC     Status: Abnormal   Collection Time   12/04/11  6:42 PM      Component Value Range Comment   Color, Urine YELLOW  YELLOW     APPearance TURBID (*) CLEAR     Specific Gravity, Urine 1.016  1.005 - 1.030     pH 7.5  5.0 - 8.0     Glucose, UA NEGATIVE  NEGATIVE (mg/dL)    Hgb urine dipstick MODERATE (*) NEGATIVE     Bilirubin Urine NEGATIVE  NEGATIVE     Ketones, ur 15 (*) NEGATIVE (mg/dL)    Protein, ur 30 (*) NEGATIVE (mg/dL)    Urobilinogen, UA 1.0  0.0 - 1.0 (mg/dL)    Nitrite NEGATIVE  NEGATIVE     Leukocytes, UA LARGE (*) NEGATIVE    URINE MICROSCOPIC-ADD ON     Status: Abnormal   Collection Time   12/04/11  6:42 PM      Component Value Range Comment   WBC, UA TOO NUMEROUS TO COUNT  <3 (WBC/hpf)    RBC / HPF 11-20  <3 (RBC/hpf)    Bacteria, UA FEW (*) RARE      Imaging / Studies: Ct Abdomen Pelvis Wo Contrast  12/04/2011  *RADIOLOGY REPORT*  Clinical Data: Nausea, vomiting.  History of bladder cancer and cystectomy.  CT ABDOMEN AND PELVIS WITHOUT CONTRAST  Technique:  Multidetector CT imaging of the abdomen and pelvis was performed following the standard protocol without intravenous contrast.  Comparison: Plain films 01/16/2007  Findings: Ground-glass opacities noted in the right lung base in both the right middle lobe and right lower lobe.  I suspect this represents scarring or atelectasis.  No effusions.  Heart is normal size.  The patient is status post prior cystectomy.  There is a right lower quadrant ileal conduit.  Parastomal herniation of small bowel loops through the anterior abdominal wall defect for the ileal conduit. Small bowel loops  in the abdomen and pelvis are dilated with decompressed distal small bowel.  The caliber change appears to be within the parastomal hernia.  Findings compatible with small bowel obstruction.  Liver, spleen, pancreas, adrenals are unremarkable.  Gallbladder is contracted.  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  Stomach and large bowel are unremarkable.  IMPRESSION: Small bowel obstruction.  There is a parastomal hernia in the right lower quadrant through the anterior abdominal wall defect for the ileal conduit.  Caliber change within the small bowel loops noted within this hernia sac.  Prior cystectomy.  Original Report Authenticated By: Raelyn Number, M.D.    Antibiotics: Anti-infectives    None      Medications / Allergies: per chart  Assessment / Plan: [name]  70 y.o. female        Problem List:  Principal Problem:  *SBO (small bowel obstruction) due to incarcerated parastomal hernia of ileal conduit  History of bladder cancer, s/p cystectomy with ileal conduit  -admit NPO NGT IVF Emergent Dx lap w exploration, possible open:  The anatomy & physiology of the digestive tract was discussed.  The pathophysiology of SBO was discussed.   Natural history risks without surgery such as death was discussed.  I recommended abdominal exploration to diagnose & treat the source of the problem.  Laparoscopic & open techniques were discussed.  Risks such as bleeding, infection, abscess, leak, reoperation, bowel resection, possible ostomy, hernia, heart attack, death, and other risks were discussed.   The risks of no intervention will lead to serious problems include death.   I expressed a good likelihood that surgery will address the problem.  Need for mesh to help control the herniation  Goals of post-operative recovery were discussed as well.  We will work to minimize complications as such risks in an emergent setting  are high.   Questions were answered.  The patient expressed understanding & wishes to proceed with surgery.      -VTE prophylaxis- SCDs, etc -mobilize as tolerated to help recovery  Morton Peters, M.D., F.A.C.S. Gastrointestinal and Minimally Invasive Surgery [facility] [address] [place], [place] [address] [phone] Main / Paging [phone] Voice Mail   12/04/2011

## 2011-12-04 NOTE — ED Notes (Signed)
Consult to bedside.

## 2011-12-04 NOTE — ED Provider Notes (Signed)
History     CSN: [id number] Arrival date & time: 12/04/2011  3:58 PM   First MD Initiated Contact with Patient 12/04/11 1752      Chief Complaint  Patient presents with  . GI Problem    (Consider location/radiation/quality/duration/timing/severity/associated sxs/prior treatment) Patient is a 70 y.o. female presenting with GI illlness. The history is provided by the patient.  GI Problem  This is a new problem. The current episode started more than 2 days ago. The problem has not changed since onset.There has been no fever. Associated symptoms include abdominal pain and vomiting. Pertinent negatives include no chills, no headaches and no myalgias.   patient has had some nausea and vomiting for the last several days. Some mild abdominal pain. Some cramping. He states initially a little diarrhea but is improved. Of note is still a little loose. She is previous removal of her bladder due to cancer and has a urostomy. No fevers. She was seen in urgent care and sent here to rule out bowel obstruction. He reportedly talked to surgery already.   Past Medical History  Diagnosis Date  . Ulcer, gastric, acute or chronic   . Cancer     bladder, s/p cystectomy with ileal conduit  . History of bladder cancer     Past Surgical History  Procedure Date  . Bladder removal     History reviewed. No pertinent family history.  History  Substance Use Topics  . Smoking status: Former Research scientist (life sciences)  . Smokeless tobacco: Not on file  . Alcohol Use: No    OB History    Grav Para Term Preterm Abortions TAB SAB Ect Mult Living                  Review of Systems  Constitutional: Positive for appetite change and fatigue. Negative for chills and activity change.  HENT: Negative for neck stiffness.   Eyes: Negative for pain.  Respiratory: Negative for chest tightness and shortness of breath.   Cardiovascular: Negative for chest pain and leg swelling.  Gastrointestinal: Positive for nausea, vomiting and  abdominal pain. Negative for diarrhea.  Genitourinary: Negative for flank pain.  Musculoskeletal: Negative for myalgias and back pain.  Skin: Negative for rash.  Neurological: Negative for weakness, numbness and headaches.  Psychiatric/Behavioral: Negative for behavioral problems.    Allergies  Imodium a-d  Home Medications   Current Outpatient Rx  Name Route Sig Dispense Refill  . COQ10 PO Oral Take 1 tablet by mouth daily.      . OMEGA-3 FATTY ACIDS 1000 MG PO CAPS Oral Take 1 g by mouth daily.      Marland Kitchen LOSARTAN POTASSIUM-HCTZ 50-12.5 MG PO TABS Oral Take 1 tablet by mouth daily.        BP 165/86  Pulse 85  Temp(Src) 98.1 F (36.7 C) (Oral)  Resp 18  SpO2 96%  Physical Exam  Nursing note and vitals reviewed. Constitutional: She is oriented to person, place, and time. She appears well-developed and well-nourished.  HENT:  Head: Normocephalic and atraumatic.  Eyes: EOM are normal. Pupils are equal, round, and reactive to light.  Neck: Normal range of motion. Neck supple.  Cardiovascular: Regular rhythm and normal heart sounds.   No murmur heard.      Mild tachycardia.  Pulmonary/Chest: Effort normal and breath sounds normal. No respiratory distress. She has no wheezes. She has no rales.  Abdominal: Soft. Bowel sounds are normal. She exhibits no distension. There is tenderness. There is no rebound and  no guarding.       Mild left lower abdomen tenderness without rebound or guarding. Urostomy right midabdomen. Patient states she has a right inguinal hernia, but no hernia palpated on examination.  Musculoskeletal: Normal range of motion.  Neurological: She is alert and oriented to person, place, and time. No cranial nerve deficit.  Skin: Skin is warm and dry.  Psychiatric: She has a normal mood and affect. Her speech is normal.   on reexam patient has para stomal hernia in the right lower quadrant. Nontender. But not reducible.  ED Course  Procedures (including critical care  time)  Labs Reviewed  DIFFERENTIAL - Abnormal; Notable for the following:    Neutrophils Relative 84 (*)    Lymphocytes Relative 9 (*)    All other components within normal limits  URINALYSIS, ROUTINE W REFLEX MICROSCOPIC - Abnormal; Notable for the following:    APPearance TURBID (*)    Hgb urine dipstick MODERATE (*)    Ketones, ur 15 (*)    Protein, ur 30 (*)    Leukocytes, UA LARGE (*)    All other components within normal limits  POCT I-STAT, CHEM 8 - Abnormal; Notable for the following:    BUN 32 (*)    Creatinine, Ser 1.70 (*)    Glucose, Bld 127 (*)    Hemoglobin 15.6 (*)    All other components within normal limits  URINE MICROSCOPIC-ADD ON - Abnormal; Notable for the following:    Bacteria, UA FEW (*)    All other components within normal limits  CBC  URINE CULTURE  I-STAT, CHEM 8  CBC  BASIC METABOLIC PANEL   Ct Abdomen Pelvis Wo Contrast  12/04/2011  *RADIOLOGY REPORT*  Clinical Data: Nausea, vomiting.  History of bladder cancer and cystectomy.  CT ABDOMEN AND PELVIS WITHOUT CONTRAST  Technique:  Multidetector CT imaging of the abdomen and pelvis was performed following the standard protocol without intravenous contrast.  Comparison: Plain films 01/16/2007  Findings: Ground-glass opacities noted in the right lung base in both the right middle lobe and right lower lobe.  I suspect this represents scarring or atelectasis.  No effusions.  Heart is normal size.  The patient is status post prior cystectomy.  There is a right lower quadrant ileal conduit.  Parastomal herniation of small bowel loops through the anterior abdominal wall defect for the ileal conduit. Small bowel loops in the abdomen and pelvis are dilated with decompressed distal small bowel.  The caliber change appears to be within the parastomal hernia.  Findings compatible with small bowel obstruction.  Liver, spleen, pancreas, adrenals are unremarkable.  Gallbladder is contracted.  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  Stomach and large bowel are unremarkable.  IMPRESSION: Small bowel obstruction.  There is a parastomal hernia in the right lower quadrant through the anterior abdominal wall defect for the ileal conduit.  Caliber change within the small bowel loops noted within this hernia sac.  Prior cystectomy.  Original Report Authenticated By: Raelyn Number, M.D.     1. Parastomal hernia of ileal conduit       MDM  Nausea vomiting significant urgent care. X-ray there showed possible bowel obstruction. CT here showed a para stomal hernia. The hernia was not reducible. Patient was seen in the ER by Dr. gross and admitted.        Jasper Riling. Alvino Chapel, MD 12/04/11 2348

## 2011-12-04 NOTE — ED Notes (Signed)
Pickering MD at bedside. 

## 2011-12-04 NOTE — ED Notes (Signed)
Pt tolerated procedure well.  Sherita RN at bedside.  Two RN verified placement.  450cc of bilious green fluid output.

## 2011-12-04 NOTE — ED Notes (Signed)
Pt c/o nausea and rates pain 6/10. Will make MD aware

## 2011-12-05 ENCOUNTER — Encounter (HOSPITAL_COMMUNITY): Payer: Self-pay | Admitting: *Deleted

## 2011-12-05 ENCOUNTER — Emergency Department (HOSPITAL_COMMUNITY): Payer: Medicare Other | Admitting: *Deleted

## 2011-12-05 ENCOUNTER — Encounter (HOSPITAL_COMMUNITY): Payer: Self-pay | Admitting: General Surgery

## 2011-12-05 ENCOUNTER — Encounter (HOSPITAL_COMMUNITY)
Admission: EM | Disposition: A | Discharge status: Home / Self Care | Payer: Self-pay | Source: Home / Self Care | Attending: Surgery

## 2011-12-05 ENCOUNTER — Other Ambulatory Visit: Payer: Self-pay

## 2011-12-05 DIAGNOSIS — K436 Other and unspecified ventral hernia with obstruction, without gangrene: Secondary | ICD-10-CM

## 2011-12-05 HISTORY — PX: PARASTOMAL HERNIA REPAIR: SHX2162

## 2011-12-05 HISTORY — PX: HERNIA REPAIR: SHX51

## 2011-12-05 HISTORY — PX: VENTRAL HERNIA REPAIR: SHX424

## 2011-12-05 HISTORY — DX: Other and unspecified ventral hernia with obstruction, without gangrene: K43.6

## 2011-12-05 SURGERY — REPAIR, HERNIA, PARASTOMAL
Anesthesia: General | Site: Abdomen | Wound class: Contaminated

## 2011-12-05 MED ORDER — ACETAMINOPHEN 10 MG/ML IV SOLN
INTRAVENOUS | Status: AC
  Filled 2011-12-05: qty 100

## 2011-12-05 MED ORDER — LACTATED RINGERS IV SOLN
INTRAVENOUS | Status: DC
  Administered 2011-12-05: 06:00:00 via INTRAVENOUS

## 2011-12-05 MED ORDER — BUPIVACAINE-EPINEPHRINE 0.25% -1:200000 IJ SOLN
INTRAMUSCULAR | Status: AC
  Filled 2011-12-05: qty 1

## 2011-12-05 MED ORDER — LACTATED RINGERS IV BOLUS (SEPSIS): 1000.0000 mL | Freq: Once | INTRAVENOUS | Status: DC

## 2011-12-05 MED ORDER — FENTANYL CITRATE 0.05 MG/ML IJ SOLN
INTRAMUSCULAR | Status: DC | PRN
  Administered 2011-12-05: 50 ug via INTRAVENOUS
  Administered 2011-12-05: 75 ug via INTRAVENOUS
  Administered 2011-12-05 (×2): 50 ug via INTRAVENOUS
  Administered 2011-12-05: 25 ug via INTRAVENOUS
  Administered 2011-12-05 (×3): 50 ug via INTRAVENOUS

## 2011-12-05 MED ORDER — PROMETHAZINE HCL 25 MG/ML IJ SOLN: 6.2500 mg | INTRAMUSCULAR | Status: DC | PRN

## 2011-12-05 MED ORDER — NEOSTIGMINE METHYLSULFATE 1 MG/ML IJ SOLN
INTRAMUSCULAR | Status: DC | PRN
  Administered 2011-12-05: 2 mg via INTRAVENOUS

## 2011-12-05 MED ORDER — PROPOFOL 10 MG/ML IV EMUL
INTRAVENOUS | Status: DC | PRN
  Administered 2011-12-05: 125 mg via INTRAVENOUS

## 2011-12-05 MED ORDER — LIDOCAINE HCL (CARDIAC) 20 MG/ML IV SOLN
INTRAVENOUS | Status: DC | PRN
  Administered 2011-12-05: 60 mg via INTRAVENOUS

## 2011-12-05 MED ORDER — BUPIVACAINE-EPINEPHRINE 0.25% -1:200000 IJ SOLN
INTRAMUSCULAR | Status: DC | PRN
  Administered 2011-12-05: 50 mg

## 2011-12-05 MED ORDER — LACTATED RINGERS IV SOLN
INTRAVENOUS | Status: DC | PRN
  Administered 2011-12-05 (×3): via INTRAVENOUS

## 2011-12-05 MED ORDER — DEXAMETHASONE SODIUM PHOSPHATE 10 MG/ML IJ SOLN
INTRAMUSCULAR | Status: DC | PRN
  Administered 2011-12-05: 10 mg via INTRAVENOUS

## 2011-12-05 MED ORDER — CISATRACURIUM BESYLATE 2 MG/ML IV SOLN
INTRAVENOUS | Status: DC | PRN
  Administered 2011-12-05 (×3): 2 mg via INTRAVENOUS
  Administered 2011-12-05 (×2): 4 mg via INTRAVENOUS
  Administered 2011-12-05 (×2): 2 mg via INTRAVENOUS
  Administered 2011-12-05: 4 mg via INTRAVENOUS

## 2011-12-05 MED ORDER — HYDROMORPHONE HCL PF 1 MG/ML IJ SOLN
0.2500 mg | INTRAMUSCULAR | Status: DC | PRN
  Administered 2011-12-05: 0.25 mg via INTRAVENOUS

## 2011-12-05 MED ORDER — ONDANSETRON HCL 4 MG/2ML IJ SOLN
INTRAMUSCULAR | Status: DC | PRN
  Administered 2011-12-05 (×2): 1 mg via INTRAVENOUS
  Administered 2011-12-05: 2 mg via INTRAVENOUS

## 2011-12-05 MED ORDER — EPHEDRINE SULFATE 50 MG/ML IJ SOLN
INTRAMUSCULAR | Status: DC | PRN
  Administered 2011-12-05: 5 mg via INTRAVENOUS
  Administered 2011-12-05: 10 mg via INTRAVENOUS

## 2011-12-05 MED ORDER — GLYCOPYRROLATE 0.2 MG/ML IJ SOLN
INTRAMUSCULAR | Status: DC | PRN
  Administered 2011-12-05: .6 mg via INTRAVENOUS

## 2011-12-05 MED ORDER — MIDAZOLAM HCL 5 MG/5ML IJ SOLN
INTRAMUSCULAR | Status: DC | PRN
  Administered 2011-12-05: .5 mg via INTRAVENOUS

## 2011-12-05 MED ORDER — SUCCINYLCHOLINE CHLORIDE 20 MG/ML IJ SOLN
INTRAMUSCULAR | Status: DC | PRN
  Administered 2011-12-05: 100 mg via INTRAVENOUS

## 2011-12-05 MED ORDER — SODIUM CHLORIDE 0.9 % IV SOLN
INTRAVENOUS | Status: AC
  Filled 2011-12-05: qty 50

## 2011-12-05 MED ORDER — PHENYLEPHRINE HCL 10 MG/ML IJ SOLN
INTRAMUSCULAR | Status: DC | PRN
  Administered 2011-12-05: 40 ug via INTRAVENOUS
  Administered 2011-12-05: 20 ug via INTRAVENOUS

## 2011-12-05 MED ORDER — ACETAMINOPHEN 10 MG/ML IV SOLN
INTRAVENOUS | Status: DC | PRN
  Administered 2011-12-05: 1000 mg via INTRAVENOUS

## 2011-12-05 MED ORDER — HYDROMORPHONE HCL PF 1 MG/ML IJ SOLN
INTRAMUSCULAR | Status: AC
  Administered 2011-12-05: 1 mg via INTRAVENOUS
  Filled 2011-12-05: qty 1

## 2011-12-05 MED ORDER — LACTATED RINGERS IR SOLN
Status: DC | PRN
  Administered 2011-12-05: 3000 mL

## 2011-12-05 MED ORDER — SODIUM CHLORIDE 0.9 % IV SOLN
1.0000 g | INTRAVENOUS | Status: DC | PRN
  Administered 2011-12-05: 1 g via INTRAVENOUS

## 2011-12-05 SURGICAL SUPPLY — 80 items
ALLOGRAFT ACELLUL DERMIS 16X20 (Tissue) ×3 IMPLANT
APPLIER CLIP 5 13 M/L LIGAMAX5 (MISCELLANEOUS) ×3
APPLIER CLIP ROT 10 11.4 M/L (STAPLE)
BAG URINE DRAINAGE (UROLOGICAL SUPPLIES) IMPLANT
BAG URO CATCHER STRL LF (DRAPE) IMPLANT
BLADE EXTENDED COATED 6.5IN (ELECTRODE) IMPLANT
BLADE HEX COATED 2.75 (ELECTRODE) IMPLANT
BLADE SURG SZ10 CARB STEEL (BLADE) IMPLANT
CABLE HIGH FREQUENCY MONO STRZ (ELECTRODE) ×3 IMPLANT
CANISTER SUCTION 2500CC (MISCELLANEOUS) ×3 IMPLANT
CATH FOLEY 2WAY SLVR  5CC 16FR (CATHETERS) ×1
CATH FOLEY 2WAY SLVR 5CC 16FR (CATHETERS) ×2 IMPLANT
CATH FOLEY SILVER 30CC 28FR (CATHETERS) IMPLANT
CELLS DAT CNTRL 66122 CELL SVR (MISCELLANEOUS) IMPLANT
CLIP APPLIE 5 13 M/L LIGAMAX5 (MISCELLANEOUS) ×2 IMPLANT
CLIP APPLIE ROT 10 11.4 M/L (STAPLE) IMPLANT
CLOTH BEACON ORANGE TIMEOUT ST (SAFETY) ×3 IMPLANT
COVER MAYO STAND STRL (DRAPES) IMPLANT
DECANTER SPIKE VIAL GLASS SM (MISCELLANEOUS) ×6 IMPLANT
DEVICE TROCAR PUNCTURE CLOSURE (ENDOMECHANICALS) ×3 IMPLANT
DRAIN CHANNEL 19F RND (DRAIN) ×3 IMPLANT
DRAPE LAPAROSCOPIC ABDOMINAL (DRAPES) ×3 IMPLANT
DRAPE LG THREE QUARTER DISP (DRAPES) IMPLANT
DRAPE UTILITY XL STRL (DRAPES) ×3 IMPLANT
DRAPE WARM FLUID 44X44 (DRAPE) ×3 IMPLANT
DRSG TEGADERM 4X4.75 (GAUZE/BANDAGES/DRESSINGS) ×3 IMPLANT
ELECT REM PT RETURN 9FT ADLT (ELECTROSURGICAL) ×3
ELECTRODE REM PT RTRN 9FT ADLT (ELECTROSURGICAL) ×2 IMPLANT
FILTER SMOKE EVAC LAPAROSHD (FILTER) IMPLANT
GLOVE BIOGEL PI IND STRL 7.0 (GLOVE) ×2 IMPLANT
GLOVE BIOGEL PI INDICATOR 7.0 (GLOVE) ×1
GLOVE ECLIPSE 8.0 STRL XLNG CF (GLOVE) ×6 IMPLANT
GLOVE INDICATOR 8.0 STRL GRN (GLOVE) ×3 IMPLANT
GOWN STRL NON-REIN LRG LVL3 (GOWN DISPOSABLE) ×3 IMPLANT
GOWN STRL REIN XL XLG (GOWN DISPOSABLE) ×3 IMPLANT
GRASPER LAPSCPC 5X35 EPIX (ENDOMECHANICALS) IMPLANT
HAND ACTIVATED (MISCELLANEOUS) IMPLANT
KIT BASIN OR (CUSTOM PROCEDURE TRAY) ×3 IMPLANT
LEGGING LITHOTOMY PAIR STRL (DRAPES) IMPLANT
LIGASURE IMPACT 36 18CM CVD LR (INSTRUMENTS) IMPLANT
NS IRRIG 1000ML POUR BTL (IV SOLUTION) ×3 IMPLANT
PENCIL BUTTON HOLSTER BLD 10FT (ELECTRODE) IMPLANT
PUMP PAIN ON-Q (MISCELLANEOUS) IMPLANT
RTRCTR WOUND ALEXIS 18CM MED (MISCELLANEOUS)
SCALPEL HARMONIC ACE (MISCELLANEOUS) ×3 IMPLANT
SCISSORS LAP 5X35 DISP (ENDOMECHANICALS) ×3 IMPLANT
SET IRRIG TUBING LAPAROSCOPIC (IRRIGATION / IRRIGATOR) ×3 IMPLANT
SLEEVE Z-THREAD 5X100MM (TROCAR) ×9 IMPLANT
SOLUTION ANTI FOG 6CC (MISCELLANEOUS) ×3 IMPLANT
SPONGE GAUZE 4X4 12PLY (GAUZE/BANDAGES/DRESSINGS) ×3 IMPLANT
SPONGE LAP 18X18 X RAY DECT (DISPOSABLE) IMPLANT
STAPLER VISISTAT 35W (STAPLE) IMPLANT
SUCTION POOLE TIP (SUCTIONS) IMPLANT
SUT NOVA NAB DX-16 0-1 5-0 T12 (SUTURE) ×12 IMPLANT
SUT PDS AB 1 CTX 36 (SUTURE) IMPLANT
SUT PDS AB 1 TP1 96 (SUTURE) IMPLANT
SUT PROLENE 2 0 KS (SUTURE) IMPLANT
SUT SILK 2 0 (SUTURE)
SUT SILK 2 0 SH CR/8 (SUTURE) IMPLANT
SUT SILK 2-0 18XBRD TIE 12 (SUTURE) IMPLANT
SUT SILK 3 0 (SUTURE)
SUT SILK 3 0 SH CR/8 (SUTURE) IMPLANT
SUT SILK 3-0 18XBRD TIE 12 (SUTURE) IMPLANT
SUT VIC AB 2-0 SH 18 (SUTURE) ×3 IMPLANT
SUT VICRYL 2 0 18  UND BR (SUTURE) ×1
SUT VICRYL 2 0 18 UND BR (SUTURE) ×2 IMPLANT
SYR 30ML LL (SYRINGE) IMPLANT
SYRINGE IRR TOOMEY STRL 70CC (SYRINGE) IMPLANT
SYS LAPSCP GELPORT 120MM (MISCELLANEOUS)
SYSTEM LAPSCP GELPORT 120MM (MISCELLANEOUS) IMPLANT
TAPE CLOTH .5X10 WHT NS (GAUZE/BANDAGES/DRESSINGS) ×3 IMPLANT
TOWEL OR 17X26 10 PK STRL BLUE (TOWEL DISPOSABLE) ×6 IMPLANT
TRAY FOLEY CATH 14FRSI W/METER (CATHETERS) IMPLANT
TRAY LAP CHOLE (CUSTOM PROCEDURE TRAY) ×3 IMPLANT
TROCAR Z-THREAD FIOS 11X100 BL (TROCAR) ×3 IMPLANT
TROCAR Z-THREAD FIOS 5X100MM (TROCAR) ×6 IMPLANT
TROCAR Z-THREAD SLEEVE 11X100 (TROCAR) IMPLANT
TUBING FILTER THERMOFLATOR (ELECTROSURGICAL) IMPLANT
YANKAUER SUCT BULB TIP 10FT TU (MISCELLANEOUS) IMPLANT
YANKAUER SUCT BULB TIP NO VENT (SUCTIONS) IMPLANT

## 2011-12-05 NOTE — Progress Notes (Signed)
Pt has BLE raised, nodular, non-tender, non-moveable growth that is approximately the size of a quarter. Pt states that the nodules have "always been there for as long as she can remember". Pt states that they do not cause any pain or " don't cause any other problems or complications". LLE growth is larger than on RLE. Memory Argue, RN

## 2011-12-05 NOTE — Op Note (Signed)
Sara Aguirre, Sara Aguirre                ACCOUNT NO.:  [id number]  MEDICAL RECORD NO.:  [id number]  LOCATION:  [facility]                         FACILITY:  [facility]  PHYSICIAN:  Adin Hector, MD     DATE OF BIRTH:  08/14/1941  DATE OF PROCEDURE:  12/05/2011 DATE OF DISCHARGE:                              OPERATIVE REPORT   PRIMARY CARE PHYSICIAN:  Robert P. Laney Pastor, MD, [facility]  UROLOGIST:  Bernestine Amass, MD  SURGEON:  Adin Hector, MD  ASSISTANT:  RN  PREOPERATIVE DIAGNOSES: 1. 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 2. Small bowel obstruction secondary to incarcerated parastomal hernia     at ileal conduit site.  POSTOPERATIVE DIAGNOSES: 1. 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 2. Small bowel obstruction secondary to incarcerated parastomal hernia     at ileal conduit site. 3. Incarcerated ventral wall incisional Swiss-cheese hernias,     primarily supraumbilical from prior midline incision.  ANESTHESIA: 1. General anesthesia. 2. Local anesthetic in a field block.  SPECIMENS:  None.  DRAINS:  None.  ESTIMATED BLOOD LOSS:  100 mL.  COMPLICATIONS:  None major.  INDICATIONS:  [name] is a 70 year old obese female who had a cystectomy with ileal conduit reconstruction for bladder Cancer.  This was 18 years ago.  She has never had any abdominal surgery since.  She had for the past week worsening abdominal pain that has progressed into more intensity with nausea and vomiting.  She had no flatus.  Her urine output decreased little bit.  She had a workup including CAT scan and physical exam that was strongly suggestive of incarcerated parastomal hernia causing a small bowel obstruction.  Neither Dr. Alvino Chapel with emergency department or myself could completely reduce it down.  The anatomy and physiology of the abdominal wall was discussed. Pathophysiology of herniation with incarceration and at risk for strangulation and death, and  other natural history risks were discussed. Options discussed.  Recommendations were made for laparoscopic possible open lysis of adhesions with reduction and repair of hernias.  Given the emergent situation and parastomal location permanent mesh was likely impossible, a biologic mesh was discussed.  Risks, benefits, and alternatives were discussed.  Questions answered, and she agreed to proceed.  OPERATIVE FINDINGS:  She had dense adhesions of omentum and interloop adhesions.  She had a loop of small intestine incarcerated up in a parastomal hernia just lateral to the ileal conduit.  There were interloop adhesions within this, implying a chronic hernia.  This was the transition point.  There was no evidence of strangulation.  DESCRIPTION OF PROCEDURE:  Informed consent was confirmed.  The patient underwent general anesthesia without any difficulty.  She was positioned supine with arms tucked.  She had a Foley balloon placed in her ileal conduit after removing her urostomy bag.  Her abdomen and Foley were prepped and draped in sterile fashion.  Surgical time-out confirmed our plan.  She was already on IV Invanz.  I entered the abdomen using an optical entry technique and placed a 5 mm port in the left upper quadrant of the abdomen.  Entry was clean. Camera inspection revealed significant adhesions involving the entire  abdominal wall except for some sparing near the anterior-axillary line. I placed a 5 mm port in the left subxiphoid region.  I used sharp and blunt dissection to create enough working space to free a port in the left lower quadrant.  I turned attention towards lysis of adhesions.  Later in the case, I placed a 5 mm port in the left paramedian region and other one on the left flank.  I freed the greater omentum off the anterior abdominal wall, especially on the left side and right upper quadrant.  There were some dense adhesions in the falciform ligament.  I mainly used  cold scissors as well as some focused blunt dissection was then wispy. Occasionally there were more dense bands, I had required focused ultrasonic Harmonic dissection.  Eventually as they were free the greater omentum off until I came down to the pubic rim.  I decided to leave the pelvic adhesions initially.  I did free the greater omentum off the ileal conduit which exits the right midabdomen.  I placed a Foley balloon there so I could better identify the limb.  He had a loop of intestine going up into the parastomal hernia.  I freed the adhesions around that and eventually reduced a moderate amount of greater omentum out.  With that I could see the loop of small bowel incarcerated up.  I eventually sharply freed that off and actually trimmed the hernia sac up in into the hernia sac and freed the edges off to help reduce the small bowel, and completely reduced it down. Further free adhesions to the ileal conduit down towards retroperitoneum but avoided over-dissection as I went below the visceral line.  The loop of small bowel that was incarcerated was very a dense ward with thick chewing gum type adhesions.  I eventually freed off  interloop adhesions and some dense hernia sac and bands using cold scissors.  Over time, I was gradually able to unfold the small bowel and straightened it out.  I could follow it more distally and eventually could find it towards the ileocecal junction.  I could find the conduit where it had been split off just at that ileocecal junction.  The distal small bowel had been very decompressed but by the end of dissection it had dilated up well.  The small bowel proximal to the parastomal hernia was rather dilated, but it was again decompressed down with careful inspection, and saw no evidence of serosal injury or other abnormalities.  No leak of bile or succus.  I did copious irrigation to ensure hemostasis.  I decided to repair the parastomal hernia.  It  was about 5 x 6 cm in size.  I used #1 Novafil stitches and laparoscopically closed and transversely with interrupted stitches x4 to help close the defect lateral to the ileal conduit stoma.  I tied the stitches with the gas down, reinflated, and that helped snug it down much better.    I was skeptical due to her obesity and numerous Swiss cheese hernias along her periumbilical midline incision that would be sufficient; therefore, I decided to use mesh.  Because it was near bowel and this was an emergent case, I decided to use biologic mesh.  I figured that if hernia was mostly closed, this would be good reinforcement.  I used FlexHD 16 x 20 cm.  I placed it in the abdomen and secured it to the anterior abdominal wall using 16 interrupted #1 Novafil stitches primarily around the periphery.  I used it underlay at the ileal conduit and had the medial edge, actually overlapping across midline towards the left paramedian region to have overlap to help cover up the ventral hernia defects as well, and those had omentum incarcerated with.  I used laparoscopic suture passer to help pull him up.  With that, I felt like primary closure with an underlay of biologic mesh in a Sugarbaker technique gave her a good chance to minimize recovery.  I did copious irrigation and reinspected bowel, and saw no injury or leak of succus or other abnormalities.  Hemostasis was good.  I evacuated carbon dioxide and removed the ports.  The left flank port site, I had to open up to put the mesh, and so therefore I closed using 0 Vicryl interrupted stitches.  Skin was closed with Monocryl stitch. Sterile dressings applied.  I discussed postoperative care with the patient in detail in the holding area.  There is no family here or available for discussion.  We will try and reach them later as it is now 5 in the morning.     Adin Hector, MD     SCG/MEDQ  D:  12/05/2011  T:  12/05/2011  Job:  [id number]

## 2011-12-05 NOTE — Progress Notes (Signed)
Pt has been educated regarding correct use of Incentive Spirometer (IS). Pt verbalizes understanding of correct use. Pt states that there is no further questions regarding correct use of the IS. Memory Argue, RN

## 2011-12-05 NOTE — Transfer of Care (Signed)
Immediate Anesthesia Transfer of Care Note  Patient: Sara Aguirre  Procedure(s) Performed:  LAPAROSCOPIC PARTIAL COLECTOMY  Patient Location: PACU  Anesthesia Type: General  Level of Consciousness: awake, alert , oriented and patient cooperative  Airway & Oxygen Therapy: Patient Spontanous Breathing and Patient connected to face mask oxygen  Post-op Assessment: Report given to PACU RN, Post -op Vital signs reviewed and stable and Patient moving all extremities  Post vital signs: Reviewed and stable  Complications: No apparent anesthesia complications

## 2011-12-05 NOTE — OR Nursing (Signed)
Foley was placed in the ileostomy site by Dr Johney Maine.

## 2011-12-05 NOTE — Brief Op Note (Addendum)
12/04/2011 - 12/05/2011  5:01 AM  PATIENT:  Sara Aguirre  70 y.o. female  PRE-OPERATIVE DIAGNOSIS:  smalll bowel obstruction at the urostomy/ileal conduit  POST-OPERATIVE DIAGNOSIS:  Incarcerated parastomal hernia  PROCEDURE:  Procedure(s): Laparoscopic lysis of adhesions x 3 hr Laparoscopic reduction & repair of incarcerated parastomal hernia primary w biologic mesh reinforcement   SURGEON:  Surgeon(s): Adin Hector, MD  ASSISTANTS: none   ANESTHESIA:   local and general  EBL:  Total I/O In: 2050 [I.V.:2000; IV Piggyback:50] Out: [id number] [Urine:200; Other:1100; Blood:50]  BLOOD ADMINISTERED:none  DRAINS: none   LOCAL MEDICATIONS USED:  BUPIVICAINE 50CC  SPECIMEN:  No Specimen  DISPOSITION OF SPECIMEN:  N/A  COUNTS:  YES  TOURNIQUET:  * No tourniquets in log *  DICTATION: .Other Dictation: Dictation Number [id number]  PLAN OF CARE: Admit to inpatient   PATIENT DISPOSITION:  PACU - hemodynamically stable.   Delay start of Pharmacological VTE agent (>24hrs) due to surgical blood loss or risk of bleeding:  {YES/NO/NOT APPLICABLE:20182

## 2011-12-05 NOTE — Progress Notes (Signed)
Day of Surgery  Subjective: Talking on phone after pain med.  Sore, but reasonably comfortable.  Objective: Vital signs in last 24 hours: Temp:  [97.2 F (36.2 C)-98.7 F (37.1 C)] 97.8 F (36.6 C) (12/18 1100) Pulse Rate:  [79-113] 85  (12/18 1100) Resp:  [16-19] 18  (12/18 1100) BP: (131-165)/(64-97) 142/75 mmHg (12/18 1100) SpO2:  [95 %-100 %] 97 % (12/18 1100)    Intake/Output from previous day: 12/17 0701 - 12/18 0700 In: 3280 [I.V.:2500; NG/GT:30; IV Piggyback:750] Out: 1350 [Urine:200; Blood:50] Intake/Output this shift: Total I/O In: -  Out: 40 [Urine:10; Emesis/NG output:30]  General appearance: alert, cooperative and no distress GI: soft, multipe sites, all are clean and dry.  Abd tender, but no distension. Not to much in NG cannister.  Lab Results:   Basename 12/04/11 1817 12/04/11 1756  WBC -- 9.2  HGB 15.6* 13.8  HCT 46.0 42.2  PLT -- PLATELET CLUMPS NOTED ON SMEAR, COUNT APPEARS DECREASED    BMET  Basename 12/04/11 1817  NA 142  K 3.6  CL 101  CO2 --  GLUCOSE 127*  BUN 32*  CREATININE 1.70*  CALCIUM --   PT/INR No results found for this basename: LABPROT:2,INR:2 in the last 72 hours   Studies/Results: Ct Abdomen Pelvis Wo Contrast  12/04/2011  *RADIOLOGY REPORT*  Clinical Data: Nausea, vomiting.  History of bladder cancer and cystectomy.  CT ABDOMEN AND PELVIS WITHOUT CONTRAST  Technique:  Multidetector CT imaging of the abdomen and pelvis was performed following the standard protocol without intravenous contrast.  Comparison: Plain films 01/16/2007  Findings: Ground-glass opacities noted in the right lung base in both the right middle lobe and right lower lobe.  I suspect this represents scarring or atelectasis.  No effusions.  Heart is normal size.  The patient is status post prior cystectomy.  There is a right lower quadrant ileal conduit.  Parastomal herniation of small bowel loops through the anterior abdominal wall defect for the ileal  conduit. Small bowel loops in the abdomen and pelvis are dilated with decompressed distal small bowel.  The caliber change appears to be within the parastomal hernia.  Findings compatible with small bowel obstruction.  Liver, spleen, pancreas, adrenals are unremarkable.  Gallbladder is contracted.  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  Stomach and large bowel are unremarkable.  IMPRESSION: Small bowel obstruction.  There is a parastomal hernia in the right lower quadrant through the anterior abdominal wall defect for the ileal conduit.  Caliber change within the small bowel loops noted within this hernia sac.  Prior cystectomy.  Original Report Authenticated By: Raelyn Number, M.D.    Anti-infectives: Anti-infectives     Start     Dose/Rate Route Frequency Ordered Stop   12/04/11 2145   ertapenem [facility]) 1 g in sodium chloride 0.9 % 50 mL IVPB        1 g 100 mL/hr over 30 Minutes Intravenous Every 24 hours 12/04/11 2145           Current Facility-Administered Medications  Medication Dose Route Frequency Provider Last Rate Last Dose  . acetaminophen (TYLENOL) suppository 650 mg  650 mg Rectal Q6H PRN Adin Hector, MD      . albuterol (PROVENTIL) (5 MG/ML) 0.5% nebulizer solution 2.5 mg  2.5 mg Nebulization Q6H PRN Adin Hector, MD      . bisacodyl (DULCOLAX) suppository 10 mg  10 mg Rectal Q12H PRN Revonda Standard.  Gross, MD      . dextrose 5 % in lactated ringers infusion   Intravenous Continuous Adin Hector, MD 100 mL/hr at 12/05/11 [phone]    . diphenhydrAMINE (BENADRYL) injection 12.5-25 mg  12.5-25 mg Intravenous Q6H PRN Adin Hector, MD      . ertapenem [facility]) 1 g in sodium chloride 0.9 % 50 mL IVPB  1 g Intravenous Q24H Adin Hector, MD      . guaiFENesin-dextromethorphan [facility] DM) 100-10 MG/5ML syrup 15 mL  15 mL Oral Q4H PRN Adin Hector, MD      . HYDROmorphone (DILAUDID) injection 0.5-2 mg   0.5-2 mg Intravenous Q30 min PRN Shea Stakes Crowford Tyler Deis., PHARMD   1 mg at 12/05/11 1011  . lactated ringers bolus 1,000 mL  1,000 mL Intravenous Q6H PRN Adin Hector, MD      . lip balm (CARMEX) ointment 1 application  1 application Topical BID Adin Hector, MD   1 application at [id number]  . magic mouthwash  15 mL Oral QID PRN Adin Hector, MD      . menthol-cetylpyridinium (CEPACOL) lozenge 3 mg  1 lozenge Oral PRN Adin Hector, MD      . metoprolol (LOPRESSOR) injection 5 mg  5 mg Intravenous Q6H PRN Adin Hector, MD      . ondansetron [facility]) injection 4 mg  4 mg Intravenous Once Ovid Curd R. Pickering, MD   4 mg at 12/04/11 1829  . ondansetron (ZOFRAN) injection 4-8 mg  4-8 mg Intravenous Q6H PRN Adin Hector, MD      . pantoprazole (PROTONIX) injection 40 mg  40 mg Intravenous Q12H Adin Hector, MD   40 mg at 12/05/11 1059  . phenol (CHLORASEPTIC) mouth spray 2 spray  2 spray Mouth/Throat PRN Adin Hector, MD      . promethazine (PHENERGAN) injection 12.5-25 mg  12.5-25 mg Intravenous Q6H PRN Adin Hector, MD      . sodium chloride 0.9 % bolus 500 mL  500 mL Intravenous Once Ovid Curd R. Pickering, MD   500 mL at 12/04/11 1830  . witch hazel-glycerin (TUCKS) pad 1 application  1 application Topical PRN Adin Hector, MD      . DISCONTD: 0.9 %  sodium chloride infusion   Intravenous Continuous Jasper Riling. Alvino Chapel, MD 125 mL/hr at 12/04/11 1831    . DISCONTD: bupivacaine-EPINEPHrine (MARCAINE W/ EPI) 0.25 % (with pres) injection    PRN Adin Hector, MD   50 mg at 12/05/11 0115  . DISCONTD: HYDROmorphone (DILAUDID) bolus via infusion 0.5-2 mg  0.5-2 mg Intravenous Q30 min PRN Adin Hector, MD      . DISCONTD: HYDROmorphone (DILAUDID) injection 0.25-0.5 mg  0.25-0.5 mg Intravenous Q5 min PRN Karsten Ro, MD   0.25 mg at 12/05/11 [id number]  . DISCONTD: lactated ringers bolus 1,000 mL  1,000 mL Intravenous Once Adin Hector, MD      . DISCONTD: lactated  ringers infusion   Intravenous Continuous Erich Montane 20 mL/hr at 12/05/11 [phone]    . DISCONTD: lactated ringers irrigation solution    PRN Adin Hector, MD   3,000 mL at 12/05/11 0123  . DISCONTD: promethazine (PHENERGAN) injection 6.25-12.5 mg  6.25-12.5 mg Intravenous Q15 min PRN Karsten Ro, MD       Facility-Administered Medications Ordered in Other Encounters  Medication Dose Route Frequency Provider Last Rate Last Dose  . DISCONTD: acetaminophen (OFIRMEV)  IV    PRN Erich Montane   1,000 mg at 12/05/11 0115  . DISCONTD: cisatracurium (NIMBEX) 2 MG/ML injection    PRN Erich Montane   2 mg at 12/05/11 0330  . DISCONTD: dexamethasone (DECADRON) injection    PRN Erich Montane   10 mg at 12/05/11 0100  . DISCONTD: ePHEDrine injection    PRN Erich Montane   5 mg at 12/05/11 0045  . DISCONTD: ertapenem (INVANZ) 1 g in sodium chloride 0.9 % 50 mL IVPB  1 g  Continuous PRN Erich Montane   1 g at 12/05/11 0000  . DISCONTD: fentaNYL (SUBLIMAZE) injection    PRN Erich Montane   50 mcg at 12/05/11 0500  . DISCONTD: glycopyrrolate (ROBINUL) injection    PRN Erich Montane   0.6 mg at 12/05/11 0445  . DISCONTD: lactated ringers infusion    Continuous PRN Erich Montane      . DISCONTD: lidocaine (cardiac) 100 mg/68ml (XYLOCAINE) 20 MG/ML injection 2%    PRN Clelia Schaumann Gray   60 mg at 12/05/11 0015  . DISCONTD: midazolam (VERSED) 5 MG/5ML injection    PRN Erich Montane   0.5 mg at 12/05/11 0015  . DISCONTD: neostigmine (PROSTIGMINE) injection   Intravenous PRN Erich Montane   2 mg at 12/05/11 0445  . DISCONTD: ondansetron (ZOFRAN) injection    PRN Erich Montane   2 mg at 12/05/11 0030  . DISCONTD: phenylephrine (NEO-SYNEPHRINE) injection    PRN Erich Montane   20 mcg at 12/05/11 0045  . DISCONTD: propofol (DIPRIVAN) 10 MG/ML infusion    PRN Erich Montane   125 mg at 12/05/11 0030  . DISCONTD: succinylcholine (ANECTINE)  injection    PRN Erich Montane   100 mg at 12/05/11 0030    Assessment/Plan POD 1 Incarcerated parastomal hernia, with laparoscopic partial colectomy 12/05/11 DR. Gross  Hx of prior cystectomy with cystectomy/ileal conduit. Hx of Gastric ulcer Patient Active Problem List  Diagnoses  . History of bladder cancer, s/p cystectomy with ileal conduit  . SBO (small bowel obstruction)   . Parastomal hernia of ileal conduit  . Incarcerated ventral hernia   Mobilize, Is, continue bowel rest, IV fluids, recheck labs AM  LOS: 1 day    JENNINGS,WILLARD 12/05/2011  Attending: Stable post op.  As above. Earnstine Regal, MD, [facility]

## 2011-12-05 NOTE — Preoperative (Signed)
Beta Blockers   Reason not to administer Beta Blockers:Not Applicable 

## 2011-12-06 LAB — CBC
MCH: 30 pg (ref 26.0–34.0)
MCHC: 32.3 g/dL (ref 30.0–36.0)
MCV: 92.8 fL (ref 78.0–100.0)
Platelets: 229 10*3/uL (ref 150–400)
RBC: 3.47 MIL/uL — ABNORMAL LOW (ref 3.87–5.11)
RDW: 13.4 % (ref 11.5–15.5)

## 2011-12-06 LAB — COMPREHENSIVE METABOLIC PANEL
ALT: 11 U/L (ref 0–35)
AST: 16 U/L (ref 0–37)
Albumin: 2.6 g/dL — ABNORMAL LOW (ref 3.5–5.2)
Calcium: 8.8 mg/dL (ref 8.4–10.5)
Creatinine, Ser: 1.76 mg/dL — ABNORMAL HIGH (ref 0.50–1.10)
GFR calc non Af Amer: 28 mL/min — ABNORMAL LOW (ref >90)
Sodium: 140 mEq/L (ref 135–145)
Total Protein: 5.8 g/dL — ABNORMAL LOW (ref 6.0–8.3)

## 2011-12-06 LAB — URINE CULTURE: Colony Count: >=100000

## 2011-12-06 MED ORDER — POTASSIUM CHLORIDE 2 MEQ/ML IV SOLN
INTRAVENOUS | Status: DC
  Administered 2011-12-06 – 2011-12-10 (×10): via INTRAVENOUS
  Filled 2011-12-06 (×22): qty 1000

## 2011-12-06 NOTE — Progress Notes (Signed)
1 Day Post-Op  Subjective: Talking on phone again.Sore, but reasonably comfortable. 412ml thru NG yesterday.  88ml since midnight. NO flatus so far.  Objective: Vital signs in last 24 hours: Temp:  [97.6 F (36.4 C)-98.7 F (37.1 C)] 98.4 F (36.9 C) (12/19 0555) Pulse Rate:  [80-120] 93  (12/19 0555) Resp:  [9-18] 13  (12/19 0555) BP: (104-142)/(68-83) 105/68 mmHg (12/19 0555) SpO2:  [92 %-99 %] 96 % (12/19 0555) Last BM Date: 12/04/11  Intake/Output from previous day: 12/18 0701 - 12/19 0700 In: 1805 [I.V.:1784; NG/GT:20] Out: 1490 [Urine:1010; Emesis/NG output:480] Intake/Output this shift: Total I/O In: -  Out: 50 [Emesis/NG output:50]  Alert, on Phone.  Chest: few rales in bases.  Abd: +BS, Tender, incisions look good.She is not distended. Lab Results:   Basename 12/06/11 0350 12/04/11 1817 12/04/11 1756  WBC 11.7* -- 9.2  HGB 10.4* 15.6* --  HCT 32.2* 46.0 --  PLT 229 -- PLATELET CLUMPS NOTED ON SMEAR, COUNT APPEARS DECREASED    BMET  Basename 12/06/11 0350 12/04/11 1817  NA 140 142  K 4.1 3.6  CL 102 101  CO2 33* --  GLUCOSE 162* 127*  BUN 25* 32*  CREATININE 1.76* 1.70*  CALCIUM 8.8 --   PT/INR No results found for this basename: LABPROT:2,INR:2 in the last 72 hours   Studies/Results: Ct Abdomen Pelvis Wo Contrast  12/04/2011  *RADIOLOGY REPORT*  Clinical Data: Nausea, vomiting.  History of bladder cancer and cystectomy.  CT ABDOMEN AND PELVIS WITHOUT CONTRAST  Technique:  Multidetector CT imaging of the abdomen and pelvis was performed following the standard protocol without intravenous contrast.  Comparison: Plain films 01/16/2007  Findings: Ground-glass opacities noted in the right lung base in both the right middle lobe and right lower lobe.  I suspect this represents scarring or atelectasis.  No effusions.  Heart is normal size.  The patient is status post prior cystectomy.  There is a right lower quadrant ileal conduit.  Parastomal herniation of  small bowel loops through the anterior abdominal wall defect for the ileal conduit. Small bowel loops in the abdomen and pelvis are dilated with decompressed distal small bowel.  The caliber change appears to be within the parastomal hernia.  Findings compatible with small bowel obstruction.  Liver, spleen, pancreas, adrenals are unremarkable.  Gallbladder is contracted.  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  Stomach and large bowel are unremarkable.  IMPRESSION: Small bowel obstruction.  There is a parastomal hernia in the right lower quadrant through the anterior abdominal wall defect for the ileal conduit.  Caliber change within the small bowel loops noted within this hernia sac.  Prior cystectomy.  Original Report Authenticated By: Raelyn Number, M.D.    Anti-infectives: Anti-infectives     Start     Dose/Rate Route Frequency Ordered Stop   12/04/11 2145   ertapenem [facility]) 1 g in sodium chloride 0.9 % 50 mL IVPB        1 g 100 mL/hr over 30 Minutes Intravenous Every 24 hours 12/04/11 2145           Current Facility-Administered Medications  Medication Dose Route Frequency Provider Last Rate Last Dose  . acetaminophen (TYLENOL) suppository 650 mg  650 mg Rectal Q6H PRN Adin Hector, MD      . albuterol (PROVENTIL) (5 MG/ML) 0.5% nebulizer solution 2.5 mg  2.5 mg Nebulization Q6H PRN Adin Hector, MD      .  bisacodyl (DULCOLAX) suppository 10 mg  10 mg Rectal Q12H PRN Adin Hector, MD      . dextrose 5 % in lactated ringers infusion   Intravenous Continuous Adin Hector, MD 100 mL/hr at 12/06/11 0429    . diphenhydrAMINE (BENADRYL) injection 12.5-25 mg  12.5-25 mg Intravenous Q6H PRN Adin Hector, MD      . ertapenem [facility]) 1 g in sodium chloride 0.9 % 50 mL IVPB  1 g Intravenous Q24H Adin Hector, MD   1 g at 12/05/11 2151  . guaiFENesin-dextromethorphan (ROBITUSSIN DM) 100-10 MG/5ML syrup 15 mL   15 mL Oral Q4H PRN Adin Hector, MD      . HYDROmorphone (DILAUDID) injection 0.5-2 mg  0.5-2 mg Intravenous Q30 min PRN Shea Stakes Crowford Tyler Deis., PHARMD   0.5 mg at 12/06/11 [id number]  . lactated ringers bolus 1,000 mL  1,000 mL Intravenous Q6H PRN Adin Hector, MD      . lip balm (CARMEX) ointment 1 application  1 application Topical BID Adin Hector, MD   1 application at [id number]  . magic mouthwash  15 mL Oral QID PRN Adin Hector, MD      . menthol-cetylpyridinium (CEPACOL) lozenge 3 mg  1 lozenge Oral PRN Adin Hector, MD      . metoprolol (LOPRESSOR) injection 5 mg  5 mg Intravenous Q6H PRN Adin Hector, MD      . ondansetron [facility]) injection 4-8 mg  4-8 mg Intravenous Q6H PRN Adin Hector, MD      . pantoprazole (PROTONIX) injection 40 mg  40 mg Intravenous Q12H Adin Hector, MD   40 mg at 12/05/11 2152  . phenol (CHLORASEPTIC) mouth spray 2 spray  2 spray Mouth/Throat PRN Adin Hector, MD      . promethazine (PHENERGAN) injection 12.5-25 mg  12.5-25 mg Intravenous Q6H PRN Adin Hector, MD      . witch hazel-glycerin (TUCKS) pad 1 application  1 application Topical PRN Adin Hector, MD        Assessment/Plan POD 1 Incarcerated parastomal hernia, with laparoscopic partial colectomy 12/05/11 DR. Gross  Hx of prior cystectomy with cystectomy/ileal conduit. Hx of Gastric ulcer Mild renal Insuffiencey, I'm going to increase IV fluid rate some.   Patient Active Problem List  Diagnoses  . History of bladder cancer, s/p cystectomy with ileal conduit  . SBO (small bowel obstruction)   . Parastomal hernia of ileal conduit  . Incarcerated ventral hernia   Mobilize, Is, continue bowel rest, IV fluids,WBC up some.  Wean O2  Increase fluid rate., recheck labs in a.m.  If she continues to have a rise in creatinine, ask renal to see.                     .  LOS: 2 days    JENNINGS,WILLARD 12/06/2011  Attending: Pt seen and examined.  Bowel sounds  present.  Drsg dry and intact.  Encourage ambulation.  Taking po ice chips. Earnstine Regal, MD, [facility]

## 2011-12-06 NOTE — Progress Notes (Signed)
Around 0200 am pt's oxygen level dropped to the low 70's.  Increased pt's O2 to 4 liters.  Sats immediately came up to 90%.  Settled at 93%.   Decreased pt's Dilaudid dose to 0.5 mg instead of the 1 mg she had been receiving every few hours.  Will continue to monitor.

## 2011-12-06 NOTE — Plan of Care (Signed)
Problem: Phase I Progression Outcomes Goal: Voiding-avoid urinary catheter unless indicated Outcome: Adequate for Discharge Patient has urostomy from previous bladder cancer 19 years ago

## 2011-12-06 NOTE — Consult Note (Signed)
WOC ostomy consult  Stoma type/location: right LQ ileal conduit Stomal assessment/size: 1 inch round (when reclining); pale pink flush stoma with os at center Peristomal assessment: clear Treatment options for stomal/peristomal skin: none required Output medium yellow, clear urine; no mucus noted Ostomy pouching: 1pc.flat pouch with convex barrier ring, attached to bedside drain bag using adapter  Education provided: Patient and I discussed management and decided to try flat pouch with barrier ring today.  If we experience pouch failure, we will try the 1-piece with built in convexity.  Patient has had stoma for 18 years and was taught by a [place] Nurse still very active in our community and who is known to me.  We are trying the flat pouch to see if we cn impact her economic outlay with convex pouches.  I will see tomorrow and follow. Thanks, Maudie Flakes, MSN, RN, [place], [place] [phone])

## 2011-12-07 LAB — CBC
Hemoglobin: 10.2 g/dL — ABNORMAL LOW (ref 12.0–15.0)
MCH: 29.6 pg (ref 26.0–34.0)
MCV: 92.5 fL (ref 78.0–100.0)
Platelets: 198 10*3/uL (ref 150–400)
RBC: 3.45 MIL/uL — ABNORMAL LOW (ref 3.87–5.11)
WBC: 12.8 10*3/uL — ABNORMAL HIGH (ref 4.0–10.5)

## 2011-12-07 LAB — BASIC METABOLIC PANEL
CO2: 28 mEq/L (ref 19–32)
Chloride: 106 mEq/L (ref 96–112)
Creatinine, Ser: 1.53 mg/dL — ABNORMAL HIGH (ref 0.50–1.10)
Glucose, Bld: 137 mg/dL — ABNORMAL HIGH (ref 70–99)

## 2011-12-07 LAB — MAGNESIUM: Magnesium: 2 mg/dL (ref 1.5–2.5)

## 2011-12-07 NOTE — Progress Notes (Signed)
Surgery:  Pt seen and examined.  Moderate distension.  BS present.  NG output down today.  Will remove NG tube and encourage ambulation, mobility.  Earnstine Regal, MD, [facility]

## 2011-12-07 NOTE — Progress Notes (Signed)
2 Days Post-Op  Subjective: 831ml recorded yesterday from NG, I can't tell how much of that is ice chips. WBC still up, BP up some this AM.  She says she's comfortable if she doesn't move.  She has walked a couple times in hall.    Objective: Vital signs in last 24 hours: Temp:  [98 F (36.7 C)-98.6 F (37 C)] 98.1 F (36.7 C) (12/20 0510) Pulse Rate:  [88-97] 88  (12/20 0510) Resp:  [12-20] 18  (12/20 0510) BP: (117-166)/(72-81) 166/77 mmHg (12/20 0510) SpO2:  [95 %-97 %] 97 % (12/20 0510) Last BM Date: 12/04/11  Intake/Output from previous day: 12/19 0701 - 12/20 0700 In: 2357.3 [P.O.:1; I.V.:2306.3; IV Piggyback:50] Out: 1730 [Urine:930; Emesis/NG output:800] Intake/Output this shift:    UP in chair, comfortable. Chest: few rales in bases.  Abd: +BS, Tender, incisions look good.She is not distended. Lab Results:   [facility] 12/07/11 0345 12/06/11 0350  WBC 12.8* 11.7*  HGB 10.2* 10.4*  HCT 31.9* 32.2*  PLT 198 229    BMET  Basename 12/07/11 0345 12/06/11 0350  NA 141 140  K 3.9 4.1  CL 106 102  CO2 28 33*  GLUCOSE 137* 162*  BUN 20 25*  CREATININE 1.53* 1.76*  CALCIUM 8.6 8.8   PT/INR No results found for this basename: LABPROT:2,INR:2 in the last 72 hours   Studies/Results: No results found.  Anti-infectives: Anti-infectives     Start     Dose/Rate Route Frequency Ordered Stop   12/04/11 2145   ertapenem [facility]) 1 g in sodium chloride 0.9 % 50 mL IVPB        1 g 100 mL/hr over 30 Minutes Intravenous Every 24 hours 12/04/11 2145           Current Facility-Administered Medications  Medication Dose Route Frequency Provider Last Rate Last Dose  . acetaminophen (TYLENOL) suppository 650 mg  650 mg Rectal Q6H PRN Adin Hector, MD      . albuterol (PROVENTIL) (5 MG/ML) 0.5% nebulizer solution 2.5 mg  2.5 mg Nebulization Q6H PRN Adin Hector, MD      . bisacodyl (DULCOLAX) suppository 10 mg  10 mg Rectal Q12H PRN Adin Hector, MD      .  dextrose 5 % and 0.9% NaCl 1,000 mL with potassium chloride 10 mEq infusion   Intravenous Continuous Earnstine Regal, PA 125 mL/hr at 12/07/11 0256    . diphenhydrAMINE (BENADRYL) injection 12.5-25 mg  12.5-25 mg Intravenous Q6H PRN Adin Hector, MD      . ertapenem [facility]) 1 g in sodium chloride 0.9 % 50 mL IVPB  1 g Intravenous Q24H Adin Hector, MD   1 g at 12/06/11 2300  . guaiFENesin-dextromethorphan (ROBITUSSIN DM) 100-10 MG/5ML syrup 15 mL  15 mL Oral Q4H PRN Adin Hector, MD      . HYDROmorphone (DILAUDID) injection 0.5-2 mg  0.5-2 mg Intravenous Q30 min PRN Shea Stakes Crowford Tyler Deis., PHARMD   0.5 mg at 12/07/11 0535  . lactated ringers bolus 1,000 mL  1,000 mL Intravenous Q6H PRN Adin Hector, MD      . lip balm (CARMEX) ointment 1 application  1 application Topical BID Adin Hector, MD   1 application at 0000000 2200  . magic mouthwash  15 mL Oral QID PRN Adin Hector, MD      . menthol-cetylpyridinium (CEPACOL) lozenge 3 mg  1 lozenge Oral PRN Adin Hector, MD      .  metoprolol (LOPRESSOR) injection 5 mg  5 mg Intravenous Q6H PRN Adin Hector, MD      . ondansetron [facility]) injection 4-8 mg  4-8 mg Intravenous Q6H PRN Adin Hector, MD      . pantoprazole (PROTONIX) injection 40 mg  40 mg Intravenous Q12H Adin Hector, MD   40 mg at 12/06/11 2259  . phenol (CHLORASEPTIC) mouth spray 2 spray  2 spray Mouth/Throat PRN Adin Hector, MD      . promethazine (PHENERGAN) injection 12.5-25 mg  12.5-25 mg Intravenous Q6H PRN Adin Hector, MD      . witch hazel-glycerin (TUCKS) pad 1 application  1 application Topical PRN Adin Hector, MD      . DISCONTD: dextrose 5 % in lactated ringers infusion   Intravenous Continuous Adin Hector, MD 100 mL/hr at 12/06/11 0429      Assessment/Plan POD 1 Incarcerated parastomal hernia, with laparoscopic partial colectomy 12/05/11 DR. Gross  Hx of prior cystectomy with cystectomy/ileal conduit. Hx of Gastric  ulcer Mild renal Insuffiencey, Creatinine is improving slightly. Basilar rales, not using Insentive much. Post op ileus  Patient Active Problem List  Diagnoses  . History of bladder cancer, s/p cystectomy with ileal conduit  . SBO (small bowel obstruction)   . Parastomal hernia of ileal conduit  . Incarcerated ventral hernia   Mobilize, Is, continue bowel rest, IV fluids,WBC up some.  Wean O2  Increase fluid rate., recheck labs in a.m.  If she continues to have a rise in creatinine, ask renal to see.                     .  LOS: 3 days    Navayah Sok 12/07/2011

## 2011-12-07 NOTE — Anesthesia Postprocedure Evaluation (Signed)
  Anesthesia Post-op Note  Patient: Sara Aguirre  Procedure(s) Performed:  HERNIA REPAIR PARASTOMAL - Laprascopic lysis of adhestons with reduction and repair with biological mesh for incarcerated parastomal and ventral long incisional hernia.; HERNIA REPAIR VENTRAL ADULT  Patient Location: PACU  Anesthesia Type: General  Level of Consciousness: oriented and sedated  Airway and Oxygen Therapy: Patient Spontanous Breathing and Patient connected to nasal cannula oxygen  Post-op Pain: mild  Post-op Assessment: Post-op Vital signs reviewed, Patient's Cardiovascular Status Stable, Respiratory Function Stable and Patent Airway  Post-op Vital Signs: stable  Complications: No apparent anesthesia complications

## 2011-12-07 NOTE — Consult Note (Signed)
WOC ostomy consult  Patient visited today for assessment of pouching system applied yesterday.  Still intact. Additional supplies in cupboard above sink in room.  Out team will follow. Maudie Flakes, MSN, RN, [place], [place] [phone])

## 2011-12-08 ENCOUNTER — Encounter (HOSPITAL_COMMUNITY): Payer: Self-pay | Admitting: Surgery

## 2011-12-08 NOTE — Progress Notes (Signed)
[facility] Attending:  Patient making progress.  Taking po clear liquids without nausea.  Bowel sounds present on exam.  Ambulating.  Will follow and advance diet as tolerated.  Likely home 2-3 days.  Earnstine Regal, MD, [facility]

## 2011-12-08 NOTE — Progress Notes (Signed)
3 Days Post-Op  Subjective: NG out, no nausea, + flatus, Still pretty sore   .    Objective: Vital signs in last 24 hours: Temp:  [98 F (36.7 C)-98.1 F (36.7 C)] 98.1 F (36.7 C) (12/21 0600) Pulse Rate:  [78-89] 78  (12/21 0600) Resp:  [18-20] 19  (12/21 0600) BP: (152-159)/(83-87) 159/84 mmHg (12/21 0600) SpO2:  [96 %-97 %] 97 % (12/21 0600) Last BM Date: 12/04/11  Intake/Output from previous day: 12/20 0701 - 12/21 0700 In: 1682 [P.O.:2; I.V.:1500; NG/GT:120; IV Piggyback:60] Out: 1875 [Urine:1575; Emesis/NG output:300] Intake/Output this shift:    UP in chair, comfortable. Chest: few rales in bases.  Abd: +BS, Tender, incisions look good.She is not distended. Lab Results:   [facility] 12/07/11 0345 12/06/11 0350  WBC 12.8* 11.7*  HGB 10.2* 10.4*  HCT 31.9* 32.2*  PLT 198 229    BMET  Basename 12/07/11 0345 12/06/11 0350  NA 141 140  K 3.9 4.1  CL 106 102  CO2 28 33*  GLUCOSE 137* 162*  BUN 20 25*  CREATININE 1.53* 1.76*  CALCIUM 8.6 8.8   PT/INR No results found for this basename: LABPROT:2,INR:2 in the last 72 hours   Studies/Results: No results found.  Anti-infectives: Anti-infectives     Start     Dose/Rate Route Frequency Ordered Stop   12/04/11 2145   ertapenem [facility]) 1 g in sodium chloride 0.9 % 50 mL IVPB        1 g 100 mL/hr over 30 Minutes Intravenous Every 24 hours 12/04/11 2145           Current Facility-Administered Medications  Medication Dose Route Frequency Provider Last Rate Last Dose  . acetaminophen (TYLENOL) suppository 650 mg  650 mg Rectal Q6H PRN Adin Hector, MD      . albuterol (PROVENTIL) (5 MG/ML) 0.5% nebulizer solution 2.5 mg  2.5 mg Nebulization Q6H PRN Adin Hector, MD      . bisacodyl (DULCOLAX) suppository 10 mg  10 mg Rectal Q12H PRN Adin Hector, MD      . dextrose 5 % and 0.9% NaCl 1,000 mL with potassium chloride 10 mEq infusion   Intravenous Continuous Earnstine Regal, PA 125 mL/hr at 12/08/11  0604    . diphenhydrAMINE (BENADRYL) injection 12.5-25 mg  12.5-25 mg Intravenous Q6H PRN Adin Hector, MD      . ertapenem [facility]) 1 g in sodium chloride 0.9 % 50 mL IVPB  1 g Intravenous Q24H Adin Hector, MD   1 g at 12/07/11 2309  . guaiFENesin-dextromethorphan (ROBITUSSIN DM) 100-10 MG/5ML syrup 15 mL  15 mL Oral Q4H PRN Adin Hector, MD      . HYDROmorphone (DILAUDID) injection 0.5-2 mg  0.5-2 mg Intravenous Q30 min PRN Shea Stakes Crowford Tyler Deis., PHARMD   1 mg at 12/07/11 2310  . lip balm (CARMEX) ointment 1 application  1 application Topical BID Adin Hector, MD   1 application at [id number]  . magic mouthwash  15 mL Oral QID PRN Adin Hector, MD      . menthol-cetylpyridinium (CEPACOL) lozenge 3 mg  1 lozenge Oral PRN Adin Hector, MD      . metoprolol (LOPRESSOR) injection 5 mg  5 mg Intravenous Q6H PRN Adin Hector, MD      . ondansetron [facility]) injection 4-8 mg  4-8 mg Intravenous Q6H PRN Adin Hector, MD      . pantoprazole (PROTONIX) injection 40 mg  40 mg Intravenous Q12H Adin Hector, MD   40 mg at 12/08/11 1031  . phenol (CHLORASEPTIC) mouth spray 2 spray  2 spray Mouth/Throat PRN Adin Hector, MD      . promethazine (PHENERGAN) injection 12.5-25 mg  12.5-25 mg Intravenous Q6H PRN Adin Hector, MD      . witch hazel-glycerin (TUCKS) pad 1 application  1 application Topical PRN Adin Hector, MD        Assessment/Plan POD 1 Incarcerated parastomal hernia, with laparoscopic partial colectomy 12/05/11 DR. Gross  Hx of prior cystectomy with cystectomy/ileal conduit. Hx of Gastric ulcer Mild renal Insuffiencey, Creatinine is improving slightly. Basilar rales, not using Insentive much. Post op ileus  Patient Active Problem List  Diagnoses  . History of bladder cancer, s/p cystectomy with ileal conduit  . SBO (small bowel obstruction)   . Parastomal hernia of ileal conduit  . Incarcerated ventral hernia   Mobilize, Is, continue bowel  rest, IV fluids,WBC up some.  Start clear liquids.  Recheck labs AM  Alishea Beaudin 12/08/2011

## 2011-12-08 NOTE — Consult Note (Signed)
Ostomy follow-up:  Urostomy pouch intact with good seal.  Pt states she is independent with pouching activities and ordering supplies.  Using bedside drainage system and denies further questions.  Supplies at bedside for use.   Julien Girt, RN, MSN, [employer]  [phone]

## 2011-12-09 LAB — BASIC METABOLIC PANEL
Calcium: 8.1 mg/dL — ABNORMAL LOW (ref 8.4–10.5)
Creatinine, Ser: 1.38 mg/dL — ABNORMAL HIGH (ref 0.50–1.10)
GFR calc non Af Amer: 37 mL/min — ABNORMAL LOW (ref >90)
Glucose, Bld: 117 mg/dL — ABNORMAL HIGH (ref 70–99)
Sodium: 142 mEq/L (ref 135–145)

## 2011-12-09 LAB — CBC
MCH: 29.1 pg (ref 26.0–34.0)
MCHC: 31.7 g/dL (ref 30.0–36.0)
Platelets: 220 10*3/uL (ref 150–400)

## 2011-12-09 LAB — MAGNESIUM: Magnesium: 1.8 mg/dL (ref 1.5–2.5)

## 2011-12-09 MED ORDER — POTASSIUM CHLORIDE 10 MEQ/100ML IV SOLN
10.0000 meq | INTRAVENOUS | Status: AC
  Administered 2011-12-09 (×4): 10 meq via INTRAVENOUS
  Filled 2011-12-09 (×4): qty 100

## 2011-12-09 NOTE — Progress Notes (Signed)
Pt has a small amount of diarrhea  stool this am.

## 2011-12-09 NOTE — Progress Notes (Signed)
4 Days Post-Op  Subjective: BM x2, tolerating liquid diet but she still feels some bloating.  Objective: Vital signs in last 24 hours: Temp:  [97.5 F (36.4 C)-98.1 F (36.7 C)] 97.5 F (36.4 C) (12/22 0500) Pulse Rate:  [72-91] 74  (12/22 0500) Resp:  [16-18] 18  (12/22 0500) BP: (144-175)/(80-93) 155/93 mmHg (12/22 0500) SpO2:  [97 %-100 %] 99 % (12/22 0830) Last BM Date: 12/09/11 (small amount of diarrhea stool)  Intake/Output from previous day: 12/21 0701 - 12/22 0700 In: 1860 [P.O.:360; I.V.:1500] Out: 650 [Urine:650] Intake/Output this shift: Total I/O In: 360 [P.O.:360] Out: -   General appearance: alert, cooperative and no distress GI: soft, mild/minimal distension, appropriate tenderness, stoma with good UOP, no evidence of infection or recurrence  Lab Results:   [facility] 12/09/11 0458 12/07/11 0345  WBC 7.6 12.8*  HGB 9.6* 10.2*  HCT 30.3* 31.9*  PLT 220 198   BMET  Basename 12/09/11 0458 12/07/11 0345  NA 142 141  K 3.1* 3.9  CL 109 106  CO2 28 28  GLUCOSE 117* 137*  BUN 10 20  CREATININE 1.38* 1.53*  CALCIUM 8.1* 8.6   PT/INR No results found for this basename: LABPROT:2,INR:2 in the last 72 hours ABG No results found for this basename: PHART:2,PCO2:2,PO2:2,HCO3:2 in the last 72 hours  Studies/Results: No results found.  Anti-infectives: Anti-infectives     Start     Dose/Rate Route Frequency Ordered Stop   12/04/11 2145   ertapenem [facility]) 1 g in sodium chloride 0.9 % 50 mL IVPB  Status:  Discontinued        1 g 100 mL/hr over 30 Minutes Intravenous Every 24 hours 12/04/11 2145 12/09/11 1107          Assessment/Plan: s/p Procedure(s): HERNIA REPAIR PARASTOMAL HERNIA REPAIR VENTRAL ADULT she is tolerating clears but still has some bloating so I will hold off on advancing her diet further, continue clears, stop abx, mobilize, and wean oxygen  LOS: 5 days    Madilyn Hook DAVID 12/09/2011

## 2011-12-10 MED ORDER — OXYCODONE HCL 5 MG PO TABS: 5.0000 mg | ORAL_TABLET | ORAL | Status: DC | PRN

## 2011-12-10 MED ORDER — ADULT MULTIVITAMIN W/MINERALS CH
1.0000 | ORAL_TABLET | Freq: Every day | ORAL | Status: DC
  Administered 2011-12-10: 1 via ORAL
  Filled 2011-12-10 (×2): qty 1

## 2011-12-10 MED ORDER — PANTOPRAZOLE SODIUM 40 MG PO TBEC
40.0000 mg | DELAYED_RELEASE_TABLET | Freq: Two times a day (BID) | ORAL | Status: DC
  Administered 2011-12-10 – 2011-12-11 (×2): 40 mg via ORAL
  Filled 2011-12-10 (×3): qty 1

## 2011-12-10 NOTE — Progress Notes (Signed)
5 Days Post-Op  Subjective: Doing well. No complaints. +BM  Objective: Vital signs in last 24 hours: Temp:  [97.9 F (36.6 C)-98.2 F (36.8 C)] 98 F (36.7 C) (12/23 0500) Pulse Rate:  [75-90] 90  (12/23 0500) Resp:  [18] 18  (12/23 0500) BP: (148-176)/(80-94) 176/94 mmHg (12/23 0500) SpO2:  [92 %-95 %] 92 % (12/23 0500) Last BM Date: 12/10/11  Intake/Output from previous day: 12/22 0701 - 12/23 0700 In: 3176.1 [P.O.:1200; I.V.:1976.1] Out: 2850 [Urine:2850] Intake/Output this shift: Total I/O In: 360 [P.O.:360] Out: -   General appearance: alert, cooperative and no distress GI: no significant tenderness, wounds ok  Lab Results:   [facility] 12/09/11 0458  WBC 7.6  HGB 9.6*  HCT 30.3*  PLT 220   BMET  Basename 12/09/11 0458  NA 142  K 3.1*  CL 109  CO2 28  GLUCOSE 117*  BUN 10  CREATININE 1.38*  CALCIUM 8.1*   PT/INR No results found for this basename: LABPROT:2,INR:2 in the last 72 hours ABG No results found for this basename: PHART:2,PCO2:2,PO2:2,HCO3:2 in the last 72 hours  Studies/Results: No results found.  Anti-infectives: Anti-infectives     Start     Dose/Rate Route Frequency Ordered Stop   12/04/11 2145   ertapenem [facility]) 1 g in sodium chloride 0.9 % 50 mL IVPB  Status:  Discontinued        1 g 100 mL/hr over 30 Minutes Intravenous Every 24 hours 12/04/11 2145 12/09/11 1107          Assessment/Plan: s/p Procedure(s): HERNIA REPAIR PARASTOMAL HERNIA REPAIR VENTRAL ADULT Advance diet Plan for discharge tomorrow  LOS: 6 days    Madilyn Hook DAVID 12/10/2011

## 2011-12-11 NOTE — Progress Notes (Signed)
Patient provided with discharge teaching. Verbalized understanding. Patient discharged to home.

## 2011-12-11 NOTE — Progress Notes (Signed)
6 Days Post-Op  Subjective: She feels well and is anxious to go home. No nausea - ate 100% of breakfast. No pain. Feels good  Objective: Vital signs in last 24 hours: Temp:  [97.5 F (36.4 C)-98.1 F (36.7 C)] 97.9 F (36.6 C) (12/24 0500) Pulse Rate:  [79-94] 79  (12/24 0500) Resp:  [18] 18  (12/24 0500) BP: (162-178)/(76-99) 162/99 mmHg (12/24 0500) SpO2:  [94 %-95 %] 95 % (12/24 0500) Last BM Date: 12/10/11  Intake/Output from previous day: 12/23 0701 - 12/24 0700 In: 720 [P.O.:720] Out: 1275 [Urine:1275] Intake/Output this shift:    General appearance: alert and no distress Resp: clear to auscultation bilaterally Cardio: regular rate and rhythm, S1, S2 normal, no murmur, click, rub or gallop GI: Soft not tender. ostomy working Incision/Wound: Furniture conservator/restorer Results:   Basename 12/09/11 0458  WBC 7.6  HGB 9.6*  HCT 30.3*  PLT 220   BMET  Basename 12/09/11 0458  NA 142  K 3.1*  CL 109  CO2 28  GLUCOSE 117*  BUN 10  CREATININE 1.38*  CALCIUM 8.1*   PT/INR No results found for this basename: LABPROT:2,INR:2 in the last 72 hours ABG No results found for this basename: PHART:2,PCO2:2,PO2:2,HCO3:2 in the last 72 hours  Studies/Results: No results found.  Anti-infectives: Anti-infectives     Start     Dose/Rate Route Frequency Ordered Stop   12/04/11 2145   ertapenem [facility]) 1 g in sodium chloride 0.9 % 50 mL IVPB  Status:  Discontinued        1 g 100 mL/hr over 30 Minutes Intravenous Every 24 hours 12/04/11 2145 12/09/11 1107          Assessment/Plan: s/p Procedure(s): HERNIA REPAIR PARASTOMAL HERNIA REPAIR VENTRAL ADULT Discharge She will follow up in our office in 7-10 days  LOS: 7 days    Maryellen Dowdle J 12/11/2011

## 2011-12-11 NOTE — Consult Note (Signed)
WOC ostomy follow-up: Stoma type/location: Urostomy pouch intact with good seal.  Pt independent with pouching routines.  Denies need for assistance or further questions. Will not plan to follow further unless re-consulted.  [address] [place], MSN, [place]

## 2011-12-15 ENCOUNTER — Telehealth (INDEPENDENT_AMBULATORY_CARE_PROVIDER_SITE_OTHER): Payer: Self-pay | Admitting: Surgery

## 2011-12-29 DIAGNOSIS — N289 Disorder of kidney and ureter, unspecified: Secondary | ICD-10-CM

## 2011-12-29 DIAGNOSIS — K259 Gastric ulcer, unspecified as acute or chronic, without hemorrhage or perforation: Secondary | ICD-10-CM

## 2011-12-29 HISTORY — DX: Gastric ulcer, unspecified as acute or chronic, without hemorrhage or perforation: K25.9

## 2011-12-29 NOTE — Discharge Summary (Signed)
Physician Discharge Summary  Patient ID: CYNIA KARWOWSKI MRN: [id number] DOB/AGE: Apr 27, 1941 71 y.o.  Admit date: 12/04/2011 Discharge date: 12/29/2011  Admission Diagnoses:  SBO due to incarcerated parastomal hernia of the ileal conduit.  Discharge Diagnoses: Incarcerated Parastomal hernia at the ileal conduit site. Incarcerated ventral wall incision swiss-cheese hernia primarily supraumbilical from prior midline. 12/05/2011 Dr. Michael Boston and Dr. Rana Snare.    Principal Problem:  *SBO (small bowel obstruction)  Active Problems:  History of bladder cancer, s/p cystectomy with ileal conduit  Parastomal hernia of ileal conduit  Incarcerated ventral hernia   PROCEDURES: Exploratory laparotomy repair of incarcerated parastomal hernia at the ileal conduit site 12/05/2011 Dr. Remo Lipps gross and Dr. Rana Snare.  Hospital Course: Patient is a 71 year old female with a history of cystectomy and ileoconduit reconstruction for bladder cancer. 18 years ago. She had abdominal pain which became progressively worse with nausea and vomiting. She presented to the emergency room with a small bowel obstruction which could not be reduced. She was admitted and taken to the OR person oratory laparotomy. She was found to have dense adhesions of the omentum and the interloop adhesions Olympus small intestine incarcerated in the parastomal hernia just lateral to the ileal conduit. There were loops of adhesions within this implant chronic hernia. The hernia was repaired after the obstruction was resolved. She was transferred to the floor. Postoperatively she had some mild renal insufficiency an ileus. Her diet was advanced slowly. A renal insufficiency improved and she was discharged home on Christmas Eve 12/11/2011 by Dr. Neldon Mc.   Disposition: Home or Self Care  Discharge Orders    Future Appointments: Provider: Department: Dept Phone: Center:   01/03/2012 11:45 AM Adin Hector, MD [facility]  [facility] [phone] None     Future Orders Please Complete By Expires   Diet - low sodium heart healthy      Increase activity slowly      Discharge instructions      Comments:   [facility], [place] [phone]  OPEN ABDOMINAL SURGERY: POST OP INSTRUCTIONS  Always review your discharge instruction sheet given to you by the facility where your surgery was performed.  IF YOU HAVE DISABILITY OR FAMILY LEAVE FORMS, YOU MUST BRING THEM TO THE OFFICE FOR PROCESSING.  PLEASE DO NOT GIVE THEM TO YOUR DOCTOR.  A prescription for pain medication may be given to you upon discharge.  Take your pain medication as prescribed, if needed.  If narcotic pain medicine is not needed, then you may take acetaminophen (Tylenol) or ibuprofen (Advil) as needed. Take your usually prescribed medications unless otherwise directed. If you need a refill on your pain medication, please contact your pharmacy. They will contact our office to request authorization.  Prescriptions will not be filled after 5pm or on week-ends. You should follow a light diet the first few days after arrival home, such as soup and crackers, pudding, etc.unless your doctor has advised otherwise. A high-fiber, low fat diet can be resumed as tolerated.   Be sure to include lots of fluids daily. Most patients will experience some swelling and bruising on the chest and neck area.  Ice packs will help.  Swelling and bruising can take several days to resolve Most patients will experience some swelling and bruising in the area of the incision. Ice pack will help. Swelling and bruising can take several days to resolve..  It is common to experience some constipation if taking pain medication after  surgery.  Increasing fluid intake and taking a stool softener will usually help or prevent this problem from occurring.  A mild laxative (Milk of Magnesia or Miralax) should be taken according to package directions if there are no bowel movements  after 48 hours.  You may have steri-strips (small skin tapes) in place directly over the incision.  These strips should be left on the skin for 7-10 days.  If your surgeon used skin glue on the incision, you may shower in 24 hours.  The glue will flake off over the next 2-3 weeks.  Any sutures or staples will be removed at the office during your follow-up visit. You may find that a light gauze bandage over your incision may keep your staples from being rubbed or pulled. You may shower and replace the bandage daily. ACTIVITIES:  You may resume regular (light) daily activities beginning the next day-such as daily self-care, walking, climbing stairs-gradually increasing activities as tolerated.  You may have sexual intercourse when it is comfortable.  Refrain from any heavy lifting or straining until approved by your doctor. You may drive when you no longer are taking prescription pain medication, you can comfortably wear a seatbelt, and you can safely maneuver your car and apply brakes Return to Work: ___________________________________ Dennis Bast should see your doctor in the office for a follow-up appointment approximately two weeks after your surgery.  Make sure that you call for this appointment within a day or two after you arrive home to insure a convenient appointment time. OTHER INSTRUCTIONS:  _____________________________________________________________ _____________________________________________________________  WHEN TO CALL YOUR DOCTOR: Fever over 101.0 Inability to urinate Nausea and/or vomiting Extreme swelling or bruising Continued bleeding from incision. Increased pain, redness, or drainage from the incision. Difficulty swallowing or breathing Muscle cramping or spasms. Numbness or tingling in hands or feet or around lips.  The clinic staff is available to answer your questions during regular business hours.  Please don't hesitate to call and ask to speak to one of the nurses if you have  concerns.  For further questions, please visit [email or web address]    Driving Restrictions      Comments:   No driving for three weeks   Lifting restrictions      Comments:   No lifting more than 15 pounds for 6 weeks   No dressing needed        Discharge Medication List as of 12/11/2011  9:19 AM    CONTINUE these medications which have NOT CHANGED   Details  Coenzyme Q10 (COQ10 PO) Take 1 tablet by mouth daily.  , Until Discontinued, Historical Med    fish oil-omega-3 fatty acids 1000 MG capsule Take 1 g by mouth daily.  , Until Discontinued, Historical Med    losartan-hydrochlorothiazide (HYZAAR) 50-12.5 MG per tablet Take 1 tablet by mouth daily.  , Until Discontinued, Historical Med       Follow-up Information    Follow up with GROSS,STEVEN C., MD. Make an appointment in 10 days.   Contact information:   [facility] [address] [place] [phone]          Signed: Earnstine Regal 12/29/2011, 5:43 PM

## 2012-01-03 ENCOUNTER — Ambulatory Visit (INDEPENDENT_AMBULATORY_CARE_PROVIDER_SITE_OTHER): Payer: Medicare Other | Admitting: Surgery

## 2012-01-03 ENCOUNTER — Encounter (INDEPENDENT_AMBULATORY_CARE_PROVIDER_SITE_OTHER): Payer: Self-pay | Admitting: Surgery

## 2012-01-03 DIAGNOSIS — K9419 Other complications of enterostomy: Secondary | ICD-10-CM

## 2012-01-03 DIAGNOSIS — K436 Other and unspecified ventral hernia with obstruction, without gangrene: Secondary | ICD-10-CM

## 2012-01-03 DIAGNOSIS — IMO0002 Reserved for concepts with insufficient information to code with codable children: Secondary | ICD-10-CM

## 2012-01-03 NOTE — Progress Notes (Signed)
Subjective:     Patient ID: Sara Aguirre, female   DOB: 01-21-1941, 71 y.o.   MRN: [id number]  HPI  KEYLANI ILYAS  02-Dec-1941 [id number]  Patient Care Team: Leandrew Koyanagi, MD as PCP - General (Family Medicine) Bernestine Amass, MD as Consulting Physician (Urology)  This patient is a 71 y.o.female who presents today for surgical evaluation.   Diagnosis: Incarcerated parastomal hernia around the ileal conduit and and her wall incisional hernias.  Procedure: Reduction and repair with mesh laparoscopically of above. 12/04/2011  The patient comes in today feeling rather well. Soreness is gone. She's having regular bowel movements. No problem with her ileal conduit. Eating well. Daily bowel movements. No fevers chills or sweats. Energy level good.  She is hoping to exercise more sure she can lose another 10 pounds  Patient Active Problem List  Diagnoses  . History of bladder cancer, s/p cystectomy with ileal conduit  . Gastric ulcer  . Renal insufficiency    Past Medical History  Diagnosis Date  . Ulcer, gastric, acute or chronic   . Cancer     bladder, s/p cystectomy with ileal conduit  . History of bladder cancer   . History of tobacco use   . Hyperlipidemia   . Parastomal hernia of ileal conduit, s/p lap repair [id number] 12/04/2011  . Incarcerated ventral hernia, s/p lap repair 12/05/2011    Past Surgical History  Procedure Date  . Bladder removal 10/1993    Dr. Risa Grill  . Parastomal hernia repair 12/05/2011    Procedure: HERNIA REPAIR PARASTOMAL;  Surgeon: Adin Hector, MD;  Location: [facility];  Service: General;;  Laprascopic lysis of adhestons with reduction and repair with biological mesh for incarcerated parastomal and ventral long incisional hernia.  . Ventral hernia repair 12/05/2011    Procedure: HERNIA REPAIR VENTRAL ADULT;  Surgeon: Adin Hector, MD;  Location: [facility];  Service: General;;  . Hernia repair 12/05/11    parastomal    History   Social  History  . Marital Status: Widowed    Spouse Name: N/A    Number of Children: N/A  . Years of Education: N/A   Occupational History  . Not on file.   Social History Main Topics  . Smoking status: Former Smoker    Quit date: 01/02/1993  . Smokeless tobacco: Never Used  . Alcohol Use: No  . Drug Use: No  . Sexually Active: Not on file   Other Topics Concern  . Not on file   Social History Narrative  . No narrative on file    Family History  Problem Relation Age of Onset  . Heart disease Mother     Current outpatient prescriptions:Coenzyme Q10 (COQ10 PO), Take 1 tablet by mouth daily.  , Disp: , Rfl: ;  fish oil-omega-3 fatty acids 1000 MG capsule, Take 1 g by mouth daily.  , Disp: , Rfl: ;  losartan-hydrochlorothiazide (HYZAAR) 50-12.5 MG per tablet, Take 1 tablet by mouth daily.  , Disp: , Rfl:   Allergies  Allergen Reactions  . Imodium A-D Rash    BP 152/90  Pulse 76  Temp(Src) 97.4 F (36.3 C) (Temporal)  Resp 18  Ht 5\' 10"  (1.778 m)  Wt 174 lb 6.4 oz (79.107 kg)  BMI 25.02 kg/m2    Review of Systems  Constitutional: Negative for fever, chills and diaphoresis.  HENT: Negative for ear pain, sore throat and trouble swallowing.   Eyes: Negative for photophobia and visual disturbance.  Respiratory: Negative for cough and choking.   Cardiovascular: Negative for chest pain and palpitations.  Gastrointestinal: Negative for nausea, vomiting, abdominal pain, diarrhea, constipation, anal bleeding and rectal pain.  Genitourinary: Negative for frequency, decreased urine volume and pelvic pain.  Musculoskeletal: Negative for myalgias and gait problem.  Skin: Negative for color change, pallor and rash.  Neurological: Negative for dizziness, speech difficulty, weakness and numbness.  Hematological: Negative for adenopathy.  Psychiatric/Behavioral: Negative for confusion and agitation. The patient is not nervous/anxious.        Objective:   Physical Exam    Constitutional: She is oriented to person, place, and time. She appears well-developed and well-nourished. No distress.  HENT:  Head: Normocephalic.  Mouth/Throat: Oropharynx is clear and moist. No oropharyngeal exudate.  Eyes: Conjunctivae and EOM are normal. Pupils are equal, round, and reactive to light. No scleral icterus.  Neck: Normal range of motion. No tracheal deviation present.  Cardiovascular: Normal rate and intact distal pulses.   Pulmonary/Chest: Effort normal. No respiratory distress. She exhibits no tenderness.  Abdominal: Soft. She exhibits no distension. There is no tenderness. Hernia confirmed negative in the right inguinal area and confirmed negative in the left inguinal area.         Incisions clean with normal healing ridges.  No hernias  Genitourinary: No vaginal discharge found.  Musculoskeletal: Normal range of motion. She exhibits no tenderness.  Lymphadenopathy:       Right: No inguinal adenopathy present.       Left: No inguinal adenopathy present.  Neurological: She is alert and oriented to person, place, and time. No cranial nerve deficit. She exhibits normal muscle tone. Coordination normal.  Skin: Skin is warm and dry. No rash noted. She is not diaphoretic.  Psychiatric: She has a normal mood and affect. Her behavior is normal.       Assessment:     Recovering well s/p emergent repair of incarcerated parastomal [place] hernias, one month out    Plan:     Increase activity as tolerated.  Low impact exercise to start, then unrestricted by the end of the month.  Do not push through pain.  Advanced on diet as tolerated. Bowel regimen to avoid problems.  Return to clinic p.r.n. I am glad that she has recovered so well,  The patient expressed understanding and appreciation

## 2012-01-10 ENCOUNTER — Encounter (INDEPENDENT_AMBULATORY_CARE_PROVIDER_SITE_OTHER): Payer: Medicare Other | Admitting: Surgery

## 2012-08-05 ENCOUNTER — Encounter: Payer: Self-pay | Admitting: Family Medicine

## 2012-08-05 ENCOUNTER — Ambulatory Visit (INDEPENDENT_AMBULATORY_CARE_PROVIDER_SITE_OTHER): Payer: Medicare Other | Admitting: Family Medicine

## 2012-08-05 VITALS — BP 138/72 | HR 81 | Temp 98.0°F | Resp 16 | Ht 68.0 in | Wt 186.2 lb

## 2012-08-05 DIAGNOSIS — N289 Disorder of kidney and ureter, unspecified: Secondary | ICD-10-CM

## 2012-08-05 DIAGNOSIS — E785 Hyperlipidemia, unspecified: Secondary | ICD-10-CM

## 2012-08-05 DIAGNOSIS — I10 Essential (primary) hypertension: Secondary | ICD-10-CM

## 2012-08-05 LAB — COMPREHENSIVE METABOLIC PANEL
Alkaline Phosphatase: 70 U/L (ref 39–117)
Creat: 1.41 mg/dL — ABNORMAL HIGH (ref 0.50–1.10)
Glucose, Bld: 102 mg/dL — ABNORMAL HIGH (ref 70–99)
Sodium: 139 mEq/L (ref 135–145)
Total Bilirubin: 0.3 mg/dL (ref 0.3–1.2)
Total Protein: 7.4 g/dL (ref 6.0–8.3)

## 2012-08-05 LAB — LIPID PANEL
LDL Cholesterol: 162 mg/dL — ABNORMAL HIGH (ref 0–99)
Total CHOL/HDL Ratio: 3.9 Ratio
Triglycerides: 161 mg/dL — ABNORMAL HIGH (ref <150)
VLDL: 32 mg/dL (ref 0–40)

## 2012-08-05 MED ORDER — LOSARTAN POTASSIUM-HCTZ 50-12.5 MG PO TABS: 1.0000 | ORAL_TABLET | Freq: Every day | ORAL | Status: DC

## 2012-08-05 NOTE — Progress Notes (Signed)
  Subjective:    Patient ID: Sara Aguirre, female    DOB: 11-Sep-1941, 71 y.o.   MRN: [id number]  HPI Sara Aguirre is a 71 y.o. female Here for med refill for HTN . Home BP's: 130-140/75-82.  Usually sees Dr. Laney Pastor.  Last lipids - 7/12.  TC 274, HDL 65, LDL 180 - notes to recheck in 3-6 months.  Had tried off pravachol and on new diet. Weight 174 to 186 today. No new side effects of meds.    Renal insufficiency - creatinine 1.99 in 12/12 here, but down to 1.38 12/09/11 on outside labs.    Drank cappucino this am, otherwise no food.     Review of Systems  Constitutional: Negative for fatigue and unexpected weight change.  Respiratory: Negative for chest tightness and shortness of breath.   Cardiovascular: Negative for chest pain, palpitations and leg swelling.  Gastrointestinal: Negative for abdominal pain and blood in stool.  Neurological: Negative for dizziness, syncope, light-headedness and headaches.       Objective:   Physical Exam  Constitutional: She is oriented to person, place, and time. She appears well-developed and well-nourished.  HENT:  Head: Normocephalic and atraumatic.  Eyes: Conjunctivae and EOM are normal. Pupils are equal, round, and reactive to light.  Neck: Carotid bruit is not present.  Cardiovascular: Normal rate, regular rhythm, normal heart sounds and intact distal pulses.   Pulmonary/Chest: Effort normal and breath sounds normal.  Abdominal: Soft. She exhibits no pulsatile midline mass. There is no tenderness.  Neurological: She is alert and oriented to person, place, and time.  Skin: Skin is warm and dry.  Psychiatric: She has a normal mood and affect. Her behavior is normal.          Assessment & Plan:  Sara Aguirre is a 71 y.o. female 1. HTN (hypertension)  losartan-hydrochlorothiazide (HYZAAR) 50-12.5 MG per tablet, Comprehensive metabolic panel, DISCONTINUED: losartan-hydrochlorothiazide (HYZAAR) 50-12.5 MG per tablet  2.  Hyperlipidemia  Comprehensive metabolic panel, Lipid panel  3. Renal insufficiency  Comprehensive metabolic panel   [id number] locally, #90, 1 rf to [facility].  Check labs above - discussed may need to restart chol med.    Patient Instructions  Your should receive a call or letter about your lab results within the next week to 10 days.  If your cholesterol is elevated, we may need to restart a cholesterol medicine.  Schedule appointment with Dr. Laney Pastor in the next 6 months.  Return to the clinic or go to the nearest emergency room if any of your symptoms worsen or new symptoms occur.

## 2012-08-05 NOTE — Patient Instructions (Signed)
Your should receive a call or letter about your lab results within the next week to 10 days.  If your cholesterol is elevated, we may need to restart a cholesterol medicine.  Schedule appointment with Dr. Laney Pastor in the next 6 months.  Return to the clinic or go to the nearest emergency room if any of your symptoms worsen or new symptoms occur.

## 2012-08-19 ENCOUNTER — Telehealth: Payer: Self-pay | Admitting: Radiology

## 2012-08-19 NOTE — Telephone Encounter (Signed)
I spoke to patient about her labs, you reccommended she go back on Pravachol, she does not want to take this medication. She is going to follow up with you in 2 months, but she did not want to take this medication, just FYI.

## 2012-08-19 NOTE — Telephone Encounter (Signed)
I have noted this and will hold on to this for Dr. Vonna Kotyk information.

## 2012-12-13 ENCOUNTER — Encounter (HOSPITAL_BASED_OUTPATIENT_CLINIC_OR_DEPARTMENT_OTHER): Payer: Self-pay

## 2012-12-13 ENCOUNTER — Emergency Department (HOSPITAL_BASED_OUTPATIENT_CLINIC_OR_DEPARTMENT_OTHER)
Admission: EM | Admit: 2012-12-13 | Discharge: 2012-12-14 | Disposition: A | Discharge status: Home / Self Care | Payer: Medicare Other | Attending: Emergency Medicine | Admitting: Emergency Medicine

## 2012-12-13 ENCOUNTER — Emergency Department (HOSPITAL_BASED_OUTPATIENT_CLINIC_OR_DEPARTMENT_OTHER): Payer: Medicare Other

## 2012-12-13 DIAGNOSIS — Z906 Acquired absence of other parts of urinary tract: Secondary | ICD-10-CM | POA: Insufficient documentation

## 2012-12-13 DIAGNOSIS — Z87891 Personal history of nicotine dependence: Secondary | ICD-10-CM | POA: Insufficient documentation

## 2012-12-13 DIAGNOSIS — Z8551 Personal history of malignant neoplasm of bladder: Secondary | ICD-10-CM | POA: Insufficient documentation

## 2012-12-13 DIAGNOSIS — Z8719 Personal history of other diseases of the digestive system: Secondary | ICD-10-CM | POA: Insufficient documentation

## 2012-12-13 DIAGNOSIS — IMO0001 Reserved for inherently not codable concepts without codable children: Secondary | ICD-10-CM | POA: Insufficient documentation

## 2012-12-13 DIAGNOSIS — R509 Fever, unspecified: Secondary | ICD-10-CM | POA: Insufficient documentation

## 2012-12-13 DIAGNOSIS — E785 Hyperlipidemia, unspecified: Secondary | ICD-10-CM | POA: Insufficient documentation

## 2012-12-13 DIAGNOSIS — N390 Urinary tract infection, site not specified: Secondary | ICD-10-CM | POA: Insufficient documentation

## 2012-12-13 LAB — COMPREHENSIVE METABOLIC PANEL
ALT: 30 U/L (ref 0–35)
Alkaline Phosphatase: 84 U/L (ref 39–117)
BUN: 24 mg/dL — ABNORMAL HIGH (ref 6–23)
CO2: 27 mEq/L (ref 19–32)
Calcium: 9.3 mg/dL (ref 8.4–10.5)
GFR calc Af Amer: 36 mL/min — ABNORMAL LOW (ref >90)
GFR calc non Af Amer: 31 mL/min — ABNORMAL LOW (ref >90)
Glucose, Bld: 180 mg/dL — ABNORMAL HIGH (ref 70–99)
Potassium: 3.7 mEq/L (ref 3.5–5.1)
Total Protein: 8.2 g/dL (ref 6.0–8.3)

## 2012-12-13 LAB — URINALYSIS, ROUTINE W REFLEX MICROSCOPIC
Bilirubin Urine: NEGATIVE
Ketones, ur: NEGATIVE mg/dL
Nitrite: POSITIVE — AB
Urobilinogen, UA: 0.2 mg/dL (ref 0.0–1.0)
pH: 6.5 (ref 5.0–8.0)

## 2012-12-13 LAB — URINE MICROSCOPIC-ADD ON

## 2012-12-13 LAB — CBC WITH DIFFERENTIAL/PLATELET
Eosinophils Absolute: 0 10*3/uL (ref 0.0–0.7)
Eosinophils Relative: 0 % (ref 0–5)
HCT: 42.1 % (ref 36.0–46.0)
Hemoglobin: 14.1 g/dL (ref 12.0–15.0)
Lymphocytes Relative: 4 % — ABNORMAL LOW (ref 12–46)
Lymphs Abs: 0.6 10*3/uL — ABNORMAL LOW (ref 0.7–4.0)
MCH: 29.6 pg (ref 26.0–34.0)
MCV: 88.3 fL (ref 78.0–100.0)
Monocytes Relative: 8 % (ref 3–12)
RBC: 4.77 MIL/uL (ref 3.87–5.11)
WBC: 15.3 10*3/uL — ABNORMAL HIGH (ref 4.0–10.5)

## 2012-12-13 IMAGING — CR DG CHEST 2V
2 series · 2 of 2 positions shown · non-contrast
Comparison: None

CLINICAL DATA: Fever.

CHEST - 2 VIEW

[w chest pa]
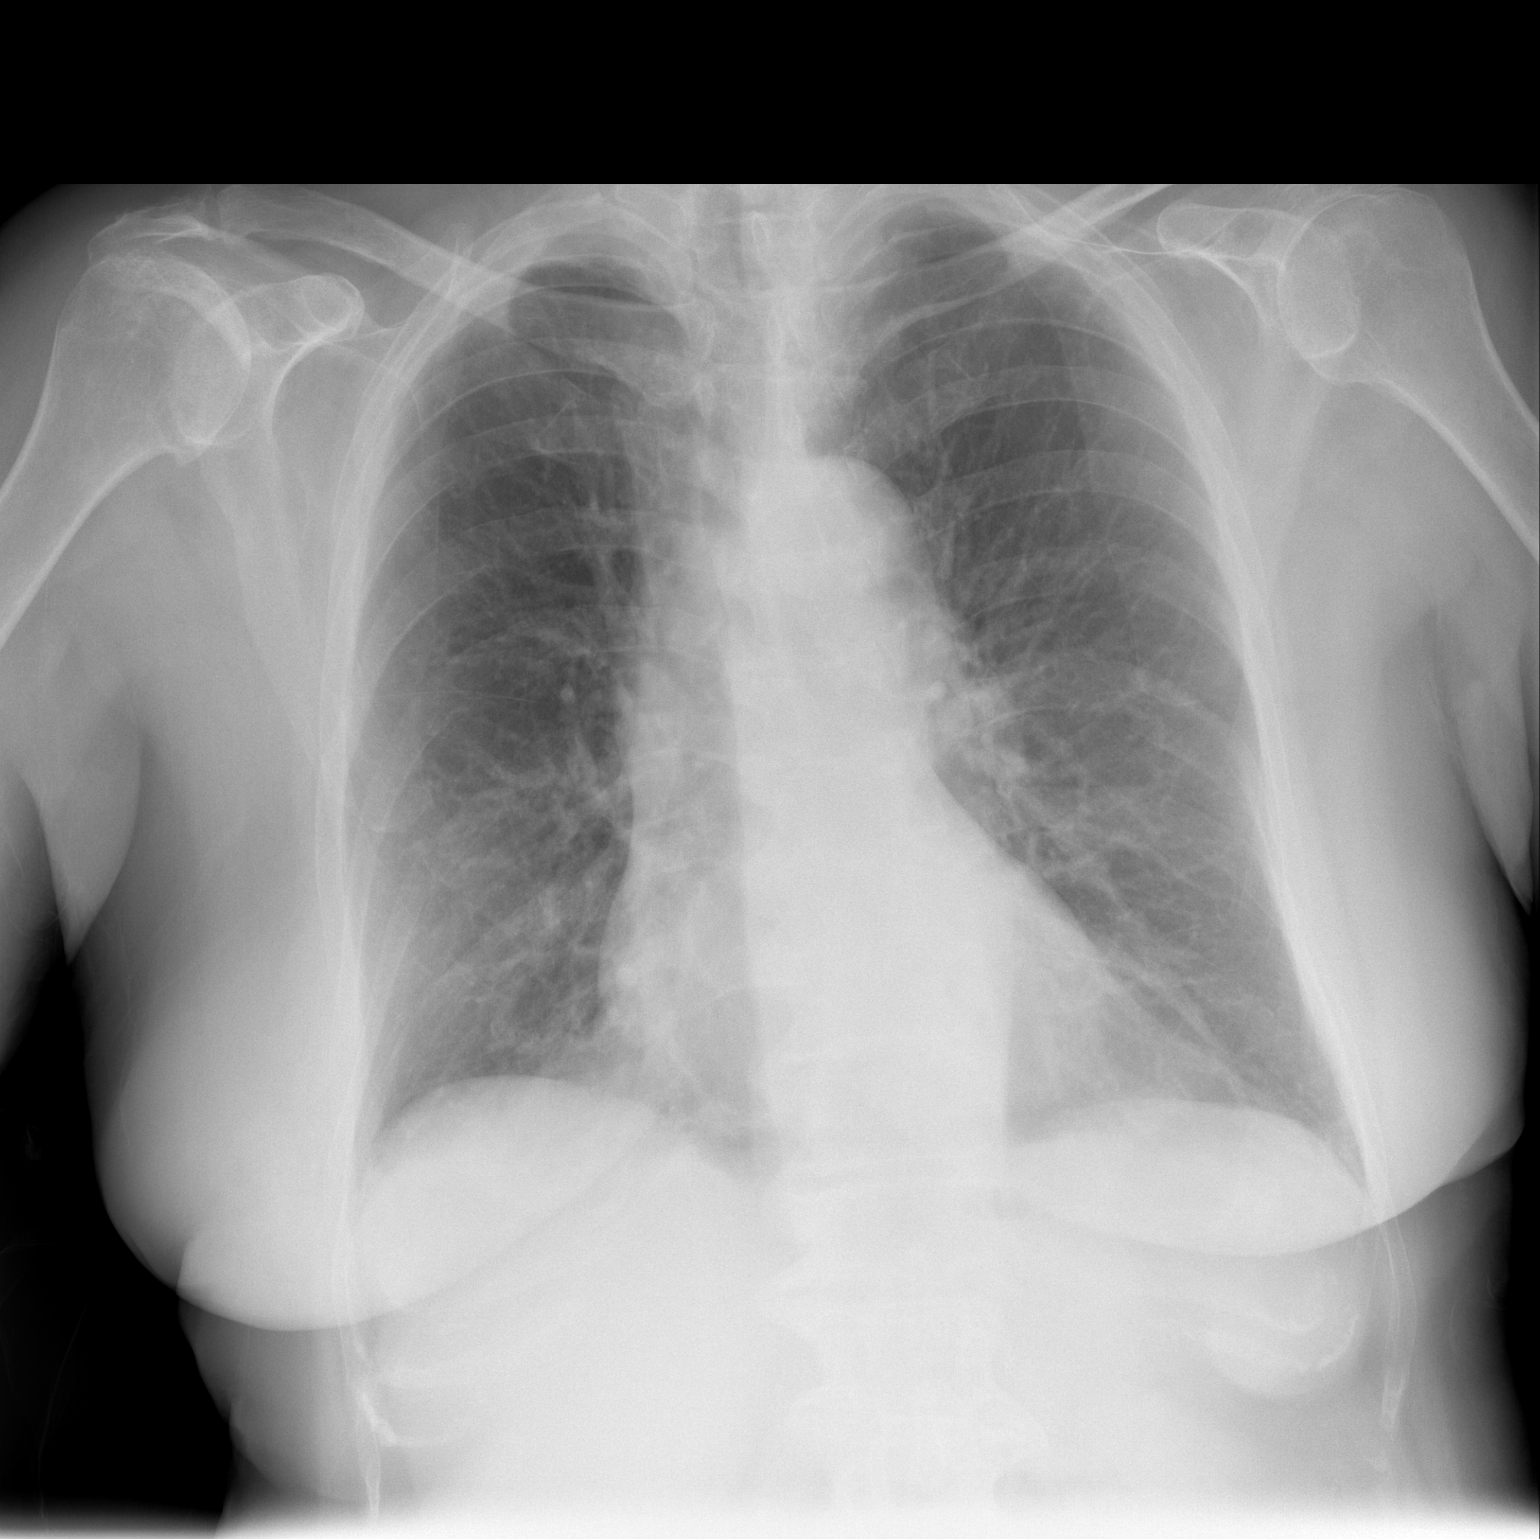

[w chest lat]
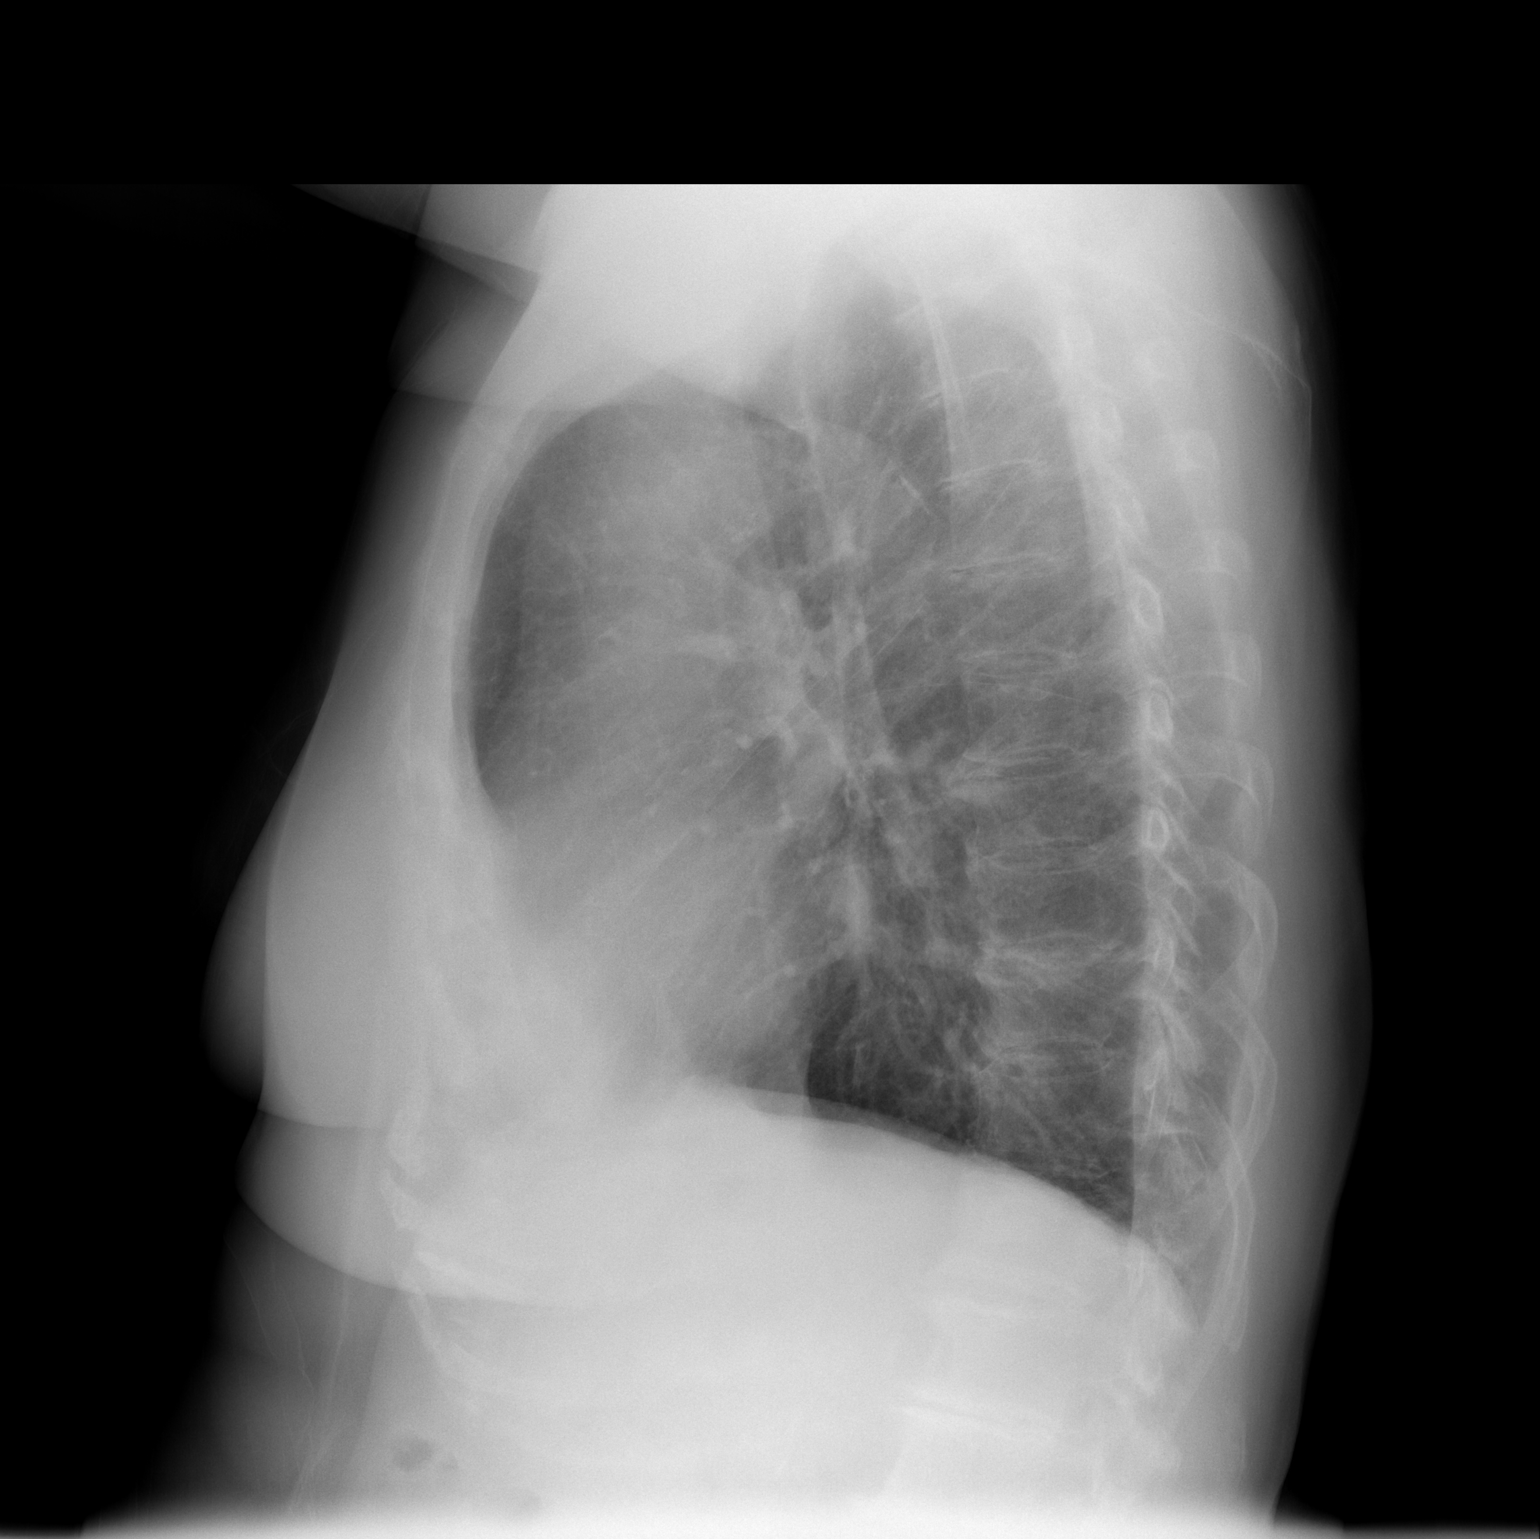

[2 of 2 positions shown; findings below may reference images not displayed]

FINDINGS: Normal heart size.  No pleural effusion or edema.
Increased lung volumes and mildly coarsened interstitial markings
noted.  No airspace consolidation.  Mild degenerative disc disease
noted within the thoracic spine.
IMPRESSION: 1.  No acute cardiopulmonary abnormalities.

## 2012-12-13 MED ORDER — CIPROFLOXACIN HCL 500 MG PO TABS: 500.0000 mg | ORAL_TABLET | Freq: Two times a day (BID) | ORAL | Status: DC

## 2012-12-13 MED ORDER — SODIUM CHLORIDE 0.9 % IV BOLUS (SEPSIS)
1000.0000 mL | Freq: Once | INTRAVENOUS | Status: AC
  Administered 2012-12-13: 1000 mL via INTRAVENOUS

## 2012-12-13 MED ORDER — ACETAMINOPHEN 325 MG PO TABS
650.0000 mg | ORAL_TABLET | Freq: Once | ORAL | Status: AC
  Administered 2012-12-13: 650 mg via ORAL

## 2012-12-13 MED ORDER — CIPROFLOXACIN HCL 500 MG PO TABS
500.0000 mg | ORAL_TABLET | Freq: Once | ORAL | Status: AC
  Administered 2012-12-13: 500 mg via ORAL
  Filled 2012-12-13: qty 1

## 2012-12-13 MED ORDER — ACETAMINOPHEN 325 MG PO TABS
ORAL_TABLET | ORAL | Status: AC
  Administered 2012-12-13: 650 mg via ORAL
  Filled 2012-12-13: qty 2

## 2012-12-13 NOTE — ED Provider Notes (Addendum)
History     CSN: [id number]  Arrival date & time 12/13/12  2032   First MD Initiated Contact with Patient 12/13/12 2133      Chief Complaint  Patient presents with  . Generalized Body Aches    (Consider location/radiation/quality/duration/timing/severity/associated sxs/prior treatment) Patient is a 71 y.o. female presenting with fever. The history is provided by the patient.  Fever Primary symptoms of the febrile illness include fever and myalgias. Primary symptoms do not include cough, nausea, vomiting or diarrhea. The current episode started yesterday. This is a new problem. The problem has been gradually worsening.  The fever began yesterday. The fever has been gradually worsening since its onset. The maximum temperature recorded prior to her arrival was unknown.  Myalgias began yesterday. The myalgias have been unchanged since their onset. The myalgias are generalized. The discomfort from the myalgias is moderate. The myalgias are not associated with weakness, tenderness or swelling.  Risk factors: states that urostomy bag has been leaking and feels like she has UTI.   Past Medical History  Diagnosis Date  . Ulcer, gastric, acute or chronic   . Cancer     bladder, s/p cystectomy with ileal conduit  . History of bladder cancer   . History of tobacco use   . Hyperlipidemia   . Parastomal hernia of ileal conduit, s/p lap repair [id number] 12/04/2011  . Incarcerated ventral hernia, s/p lap repair 12/05/2011    Past Surgical History  Procedure Date  . Bladder removal 10/1993    Dr. Risa Grill  . Parastomal hernia repair 12/05/2011    Procedure: HERNIA REPAIR PARASTOMAL;  Surgeon: Adin Hector, MD;  Location: [facility];  Service: General;;  Laprascopic lysis of adhestons with reduction and repair with biological mesh for incarcerated parastomal and ventral long incisional hernia.  . Ventral hernia repair 12/05/2011    Procedure: HERNIA REPAIR VENTRAL ADULT;  Surgeon: Adin Hector, MD;  Location: [facility];  Service: General;;  . Hernia repair 12/05/11    parastomal  . Revision urostomy cutaneous     Family History  Problem Relation Age of Onset  . Heart disease Mother     History  Substance Use Topics  . Smoking status: Former Smoker    Quit date: 01/02/1993  . Smokeless tobacco: Never Used  . Alcohol Use: No    OB History    Grav Para Term Preterm Abortions TAB SAB Ect Mult Living                  Review of Systems  Constitutional: Positive for fever.  Respiratory: Negative for cough.   Gastrointestinal: Negative for nausea, vomiting and diarrhea.  Musculoskeletal: Positive for myalgias.  Neurological: Negative for weakness.  All other systems reviewed and are negative.    Allergies  Loperamide hcl  Home Medications   Current Outpatient Rx  Name  Route  Sig  Dispense  Refill  . CALCIUM + D PO   Oral   Take by mouth.         . ONE-DAILY MULTI VITAMINS PO TABS   Oral   Take 1 tablet by mouth daily.         . COQ10 PO   Oral   Take 1 tablet by mouth daily.           . OMEGA-3 FATTY ACIDS 1000 MG PO CAPS   Oral   Take 1 g by mouth daily.           Marland Kitchen  LOSARTAN POTASSIUM-HCTZ 50-12.5 MG PO TABS   Oral   Take 1 tablet by mouth daily.   90 tablet   1     BP 143/94  Pulse 110  Temp 102.5 F (39.2 C) (Oral)  Resp 20  Ht 5\' 9"  (1.753 m)  Wt 200 lb (90.719 kg)  BMI 29.53 kg/m2  SpO2 94%  Physical Exam  Nursing note and vitals reviewed. Constitutional: She is oriented to person, place, and time. She appears well-developed and well-nourished. No distress.  HENT:  Head: Normocephalic and atraumatic.  Mouth/Throat: Oropharynx is clear and moist.  Eyes: Conjunctivae normal and EOM are normal. Pupils are equal, round, and reactive to light.  Neck: Normal range of motion. Neck supple.  Cardiovascular: Regular rhythm and intact distal pulses.  Tachycardia present.   No murmur heard. Pulmonary/Chest: Effort normal  and breath sounds normal. No respiratory distress. She has no wheezes. She has no rales.  Abdominal: Soft. She exhibits no distension. There is no tenderness. There is no rebound and no guarding.    Musculoskeletal: Normal range of motion. She exhibits no edema and no tenderness.  Neurological: She is alert and oriented to person, place, and time.  Skin: Skin is warm and dry. No rash noted. No erythema.  Psychiatric: She has a normal mood and affect. Her behavior is normal.    ED Course  Procedures (including critical care time)  Labs Reviewed  CBC WITH DIFFERENTIAL - Abnormal; Notable for the following:    WBC 15.3 (*)     Neutrophils Relative 88 (*)     Neutro Abs 13.5 (*)     Lymphocytes Relative 4 (*)     Lymphs Abs 0.6 (*)     Monocytes Absolute 1.2 (*)     All other components within normal limits  COMPREHENSIVE METABOLIC PANEL - Abnormal; Notable for the following:    Chloride 94 (*)     Glucose, Bld 180 (*)     BUN 24 (*)     Creatinine, Ser 1.60 (*)     Albumin 3.4 (*)     GFR calc non Af Amer 31 (*)     GFR calc Af Amer 36 (*)     All other components within normal limits  URINALYSIS, ROUTINE W REFLEX MICROSCOPIC - Abnormal; Notable for the following:    APPearance TURBID (*)     Hgb urine dipstick LARGE (*)     Protein, ur 100 (*)     Nitrite POSITIVE (*)     Leukocytes, UA LARGE (*)     All other components within normal limits  URINE MICROSCOPIC-ADD ON - Abnormal; Notable for the following:    Bacteria, UA MANY (*)     All other components within normal limits  URINE CULTURE   Dg Chest 2 View  12/13/2012  *RADIOLOGY REPORT*  Clinical Data: Fever.  CHEST - 2 VIEW  Comparison: None  Findings: Normal heart size.  No pleural effusion or edema. Increased lung volumes and mildly coarsened interstitial markings noted.  No airspace consolidation.  Mild degenerative disc disease noted within the thoracic spine.  IMPRESSION:  1.  No acute cardiopulmonary abnormalities.    Original Report Authenticated By: Kerby Moors, M.D.      1. UTI (lower urinary tract infection)       MDM   Patient presenting due to fever and malaise starting yesterday. Patient has a history of a urostomy bag but states recently it has been leaking and the urine  smells foul. She denies cough, fever, nausea or vomiting. On exam she has a temperature of 102 with mild tachycardia. She has no abdominal pain and is otherwise well-appearing. Her urine shows sign of new infection with large leukocytes positive nitrites and too numerous to count white blood cells. Leukocytosis of 15,000 and creatinine that is within her normal range of 1.7-1.4.  Her past urine culture showed pansensitive susceptibilities except to amoxicillin.  Patient has no antibiotic allergies and will start her on Cipro. Urine culture sent. Because patient is well-appearing with no vomiting, no back pain or abdominal pain. Feel that she is safe to go home and take oral antibiotics.        Blanchie Dessert, MD 12/13/12 [id number]  Blanchie Dessert, MD 12/13/12 2332

## 2012-12-13 NOTE — ED Notes (Signed)
Pt states she is aching all over-son states today at 5pm pt would not respond to her-pt states "he wouldn't leave me alone and let me sleep"-pt denies pain is A/O-states "i have been running a fever"

## 2012-12-15 LAB — URINE CULTURE

## 2012-12-16 NOTE — ED Notes (Signed)
+   urine Patient treated with cipro-sensitive to same-chart appended per protocol MD.

## 2013-03-24 ENCOUNTER — Telehealth: Payer: Self-pay

## 2013-03-24 DIAGNOSIS — I10 Essential (primary) hypertension: Secondary | ICD-10-CM

## 2013-03-24 MED ORDER — LOSARTAN POTASSIUM-HCTZ 50-12.5 MG PO TABS: 1.0000 | ORAL_TABLET | Freq: Every day | ORAL | Status: DC

## 2013-03-24 NOTE — Telephone Encounter (Signed)
Patient wants renewal of Losartan to mail order. Sent in for her, but she needs follow up. April has advised her she needs an appt.

## 2013-08-07 ENCOUNTER — Telehealth: Payer: Self-pay

## 2013-08-07 DIAGNOSIS — I10 Essential (primary) hypertension: Secondary | ICD-10-CM

## 2013-08-07 MED ORDER — LOSARTAN POTASSIUM-HCTZ 50-12.5 MG PO TABS: 1.0000 | ORAL_TABLET | Freq: Every day | ORAL | Status: DC

## 2013-08-07 NOTE — Telephone Encounter (Signed)
rx sent.  Meds ordered this encounter  Medications  . losartan-hydrochlorothiazide (HYZAAR) 50-12.5 MG per tablet    Sig: Take 1 tablet by mouth daily.    Dispense:  90 tablet    Refill:  0    Needs follow up appt. For further refills.    Order Specific Question:  Supervising Provider    Answer:  Tami Lin P [id number]

## 2013-08-07 NOTE — Telephone Encounter (Signed)
Called pt to let her know. Had to change pharmacy to [facility].

## 2013-08-07 NOTE — Telephone Encounter (Signed)
Patient would a refill on her losartan she has an appt with dr Laney Pastor in September [phone]

## 2013-08-20 ENCOUNTER — Encounter: Payer: Self-pay | Admitting: Internal Medicine

## 2013-08-20 ENCOUNTER — Ambulatory Visit (INDEPENDENT_AMBULATORY_CARE_PROVIDER_SITE_OTHER): Payer: Medicare Other | Admitting: Internal Medicine

## 2013-08-20 VITALS — BP 186/104 | HR 72 | Temp 98.8°F | Resp 16 | Ht 68.0 in | Wt 174.8 lb

## 2013-08-20 DIAGNOSIS — Z6824 Body mass index (BMI) 24.0-24.9, adult: Secondary | ICD-10-CM | POA: Insufficient documentation

## 2013-08-20 DIAGNOSIS — Z8551 Personal history of malignant neoplasm of bladder: Secondary | ICD-10-CM

## 2013-08-20 DIAGNOSIS — E041 Nontoxic single thyroid nodule: Secondary | ICD-10-CM | POA: Insufficient documentation

## 2013-08-20 DIAGNOSIS — E785 Hyperlipidemia, unspecified: Secondary | ICD-10-CM | POA: Insufficient documentation

## 2013-08-20 DIAGNOSIS — N289 Disorder of kidney and ureter, unspecified: Secondary | ICD-10-CM

## 2013-08-20 DIAGNOSIS — I10 Essential (primary) hypertension: Secondary | ICD-10-CM | POA: Insufficient documentation

## 2013-08-20 DIAGNOSIS — Z6827 Body mass index (BMI) 27.0-27.9, adult: Secondary | ICD-10-CM

## 2013-08-20 HISTORY — DX: Nontoxic single thyroid nodule: E04.1

## 2013-08-20 LAB — CBC WITH DIFFERENTIAL/PLATELET
Basophils Absolute: 0 10*3/uL (ref 0.0–0.1)
Eosinophils Absolute: 0.1 10*3/uL (ref 0.0–0.7)
Eosinophils Relative: 1 % (ref 0–5)
Lymphocytes Relative: 14 % (ref 12–46)
MCV: 86.9 fL (ref 78.0–100.0)
Neutrophils Relative %: 79 % — ABNORMAL HIGH (ref 43–77)
Platelets: 261 10*3/uL (ref 150–400)
RBC: 4.95 MIL/uL (ref 3.87–5.11)
RDW: 14.6 % (ref 11.5–15.5)
WBC: 7.3 10*3/uL (ref 4.0–10.5)

## 2013-08-20 LAB — COMPREHENSIVE METABOLIC PANEL
ALT: 60 U/L — ABNORMAL HIGH (ref 0–35)
Albumin: 3.9 g/dL (ref 3.5–5.2)
CO2: 29 mEq/L (ref 19–32)
Calcium: 9.3 mg/dL (ref 8.4–10.5)
Chloride: 102 mEq/L (ref 96–112)
Glucose, Bld: 93 mg/dL (ref 70–99)
Potassium: 4.1 mEq/L (ref 3.5–5.3)
Sodium: 138 mEq/L (ref 135–145)
Total Bilirubin: 0.6 mg/dL (ref 0.3–1.2)
Total Protein: 6.7 g/dL (ref 6.0–8.3)

## 2013-08-20 LAB — TSH: TSH: 1.058 u[IU]/mL (ref 0.350–4.500)

## 2013-08-20 LAB — LIPID PANEL: LDL Cholesterol: 129 mg/dL — ABNORMAL HIGH (ref 0–99)

## 2013-08-20 MED ORDER — LOSARTAN POTASSIUM-HCTZ 50-12.5 MG PO TABS: 1.0000 | ORAL_TABLET | Freq: Every day | ORAL | Status: DC

## 2013-08-20 NOTE — Progress Notes (Signed)
  Subjective:    Patient ID: Sara Aguirre, female    DOB: 07/06/1941, 72 y.o.   MRN: [id number]  HPI Patient Active Problem List   Diagnosis Date Noted  . Gastric ulcer 12/29/2011  . Renal insufficiency 12/29/2011  . History of bladder cancer, s/p cystectomy with ileal conduit     -  Hx hyperlip  Current outpatient prescriptions:losartan-hydrochlorothiazide (HYZAAR) 50-12.5 MG per tablet, Take 1 tablet by mouth daily Multiple Vitamin (MULTIVITAMIN) tablet, Take 1 tablet by mouth daily Calcium Carbonate-Vitamin D (CALCIUM + D PO), :  ciprofloxacin (CIPRO) 500 MG tablet, Take 1 tablet (500 mg total) by mouth 2 (two) times daily Coenzyme Q10 (COQ10 PO) fish oil-omega-3 fatty acids 1000 MG capsule, Take 1 g by mouth daily.  , Disp: , Rfl:    Walks/bikes/weights Plant based diet-no meat,dairy ,seafood,sugar 16lbs lost-goal 150-155(was above 200) 10 day smoothie cleanse started Wants to avoid statins Home bp 120s - 140s--will nottake meds til pressure above 140  immun-she refuses all No colonos either  Review of Systems  Constitutional: Negative for fever, activity change, appetite change, fatigue and unexpected weight change.  HENT: Negative for hearing loss, trouble swallowing, neck pain and voice change.   Eyes: Negative for photophobia and visual disturbance.  Respiratory: Negative for shortness of breath and wheezing.   Cardiovascular: Negative for chest pain, palpitations and leg swelling.  Gastrointestinal: Negative for abdominal pain, diarrhea and constipation.  Endocrine: Negative.   Genitourinary: Negative.   Musculoskeletal: Negative for myalgias, arthralgias and gait problem.  Skin: Negative for rash.  Neurological: Negative for dizziness, speech difficulty and headaches.  Hematological: Negative for adenopathy. Does not bruise/bleed easily.  Psychiatric/Behavioral: Negative for behavioral problems, sleep disturbance and dysphoric mood.       Objective:   Physical Exam BP 186/104  Pulse 72  Temp(Src) 98.8 F (37.1 C) (Oral)  Resp 16  Ht 5\' 8"  (1.727 m)  Wt 174 lb 12.8 oz (79.289 kg)  BMI 26.58 kg/m2  SpO2 95% HEENT clear No thyrmeg//pos L lower soft nod as before Ht reg no M Lungs cl extr no edema/fullpulses        Assessment & Plan:  HTN (hypertension) - Plan: losartan-hydrochlorothiazide (HYZAAR) 50-12.5 MG per tablet, Comprehensive metabolic panel  Renal insufficiency - Plan: Comprehensive metabolic panel  Thyroid nodule - Plan: TSH  Other and unspecified hyperlipidemia - Plan: Lipid panel  History of bladder cancer, s/p cystectomy with ileal conduit - Plan: CBC with Differential  BMI 27.0-27.9,adult  Meds ordered this encounter  Medications  . Multiple Vitamins-Minerals (ALIVE WOMENS ENERGY) TABS    Sig: Take by mouth.  . losartan-hydrochlorothiazide (HYZAAR) 50-12.5 MG per tablet    Sig: Take 1 tablet by mouth daily.    Dispense:  90 tablet    Refill:  3    Order Specific Question:  Supervising Provider    Answer:  Hadyn Azer P [id number]

## 2013-08-24 ENCOUNTER — Encounter: Payer: Self-pay | Admitting: Internal Medicine

## 2013-10-23 ENCOUNTER — Other Ambulatory Visit: Payer: Self-pay

## 2014-08-22 ENCOUNTER — Other Ambulatory Visit: Payer: Self-pay | Admitting: Internal Medicine

## 2014-09-04 ENCOUNTER — Ambulatory Visit (INDEPENDENT_AMBULATORY_CARE_PROVIDER_SITE_OTHER): Payer: Medicare Other | Admitting: Internal Medicine

## 2014-09-04 VITALS — BP 210/100 | HR 74 | Temp 98.5°F | Resp 18 | Ht 67.5 in | Wt 166.0 lb

## 2014-09-04 DIAGNOSIS — I10 Essential (primary) hypertension: Secondary | ICD-10-CM

## 2014-09-04 DIAGNOSIS — Z8551 Personal history of malignant neoplasm of bladder: Secondary | ICD-10-CM

## 2014-09-04 DIAGNOSIS — E785 Hyperlipidemia, unspecified: Secondary | ICD-10-CM

## 2014-09-04 DIAGNOSIS — R7989 Other specified abnormal findings of blood chemistry: Secondary | ICD-10-CM

## 2014-09-04 DIAGNOSIS — R945 Abnormal results of liver function studies: Secondary | ICD-10-CM

## 2014-09-04 DIAGNOSIS — N289 Disorder of kidney and ureter, unspecified: Secondary | ICD-10-CM

## 2014-09-04 LAB — POCT UA - MICROSCOPIC ONLY
AMORPHOUS: POSITIVE
CRYSTALS, UR, HPF, POC: NEGATIVE
Casts, Ur, LPF, POC: NEGATIVE
MUCUS UA: NEGATIVE
YEAST UA: NEGATIVE

## 2014-09-04 LAB — CBC WITH DIFFERENTIAL/PLATELET
BASOS ABS: 0 10*3/uL (ref 0.0–0.1)
Basophils Relative: 0 % (ref 0–1)
EOS PCT: 2 % (ref 0–5)
Eosinophils Absolute: 0.2 10*3/uL (ref 0.0–0.7)
HEMATOCRIT: 42.2 % (ref 36.0–46.0)
HEMOGLOBIN: 14.4 g/dL (ref 12.0–15.0)
LYMPHS ABS: 0.9 10*3/uL (ref 0.7–4.0)
LYMPHS PCT: 11 % — AB (ref 12–46)
MCH: 29.8 pg (ref 26.0–34.0)
MCHC: 34.1 g/dL (ref 30.0–36.0)
MCV: 87.4 fL (ref 78.0–100.0)
MONO ABS: 0.7 10*3/uL (ref 0.1–1.0)
MONOS PCT: 9 % (ref 3–12)
Neutro Abs: 6.2 10*3/uL (ref 1.7–7.7)
Neutrophils Relative %: 78 % — ABNORMAL HIGH (ref 43–77)
Platelets: 321 10*3/uL (ref 150–400)
RBC: 4.83 MIL/uL (ref 3.87–5.11)
RDW: 13.7 % (ref 11.5–15.5)
WBC: 7.9 10*3/uL (ref 4.0–10.5)

## 2014-09-04 LAB — COMPREHENSIVE METABOLIC PANEL
ALBUMIN: 4.2 g/dL (ref 3.5–5.2)
ALT: 12 U/L (ref 0–35)
AST: 18 U/L (ref 0–37)
Alkaline Phosphatase: 88 U/L (ref 39–117)
BUN: 17 mg/dL (ref 6–23)
CALCIUM: 9.8 mg/dL (ref 8.4–10.5)
CHLORIDE: 101 meq/L (ref 96–112)
CO2: 29 meq/L (ref 19–32)
CREATININE: 1.21 mg/dL — AB (ref 0.50–1.10)
GLUCOSE: 89 mg/dL (ref 70–99)
POTASSIUM: 4.8 meq/L (ref 3.5–5.3)
Sodium: 140 mEq/L (ref 135–145)
Total Bilirubin: 0.6 mg/dL (ref 0.2–1.2)
Total Protein: 7.3 g/dL (ref 6.0–8.3)

## 2014-09-04 LAB — LIPID PANEL
CHOL/HDL RATIO: 4 ratio
CHOLESTEROL: 278 mg/dL — AB (ref 0–200)
HDL: 70 mg/dL (ref >39)
LDL Cholesterol: 170 mg/dL — ABNORMAL HIGH (ref 0–99)
TRIGLYCERIDES: 189 mg/dL — AB (ref <150)
VLDL: 38 mg/dL (ref 0–40)

## 2014-09-04 MED ORDER — LOSARTAN POTASSIUM-HCTZ 100-12.5 MG PO TABS: 1.0000 | ORAL_TABLET | Freq: Every day | ORAL | Status: DC

## 2014-09-04 NOTE — Progress Notes (Addendum)
Subjective:    Patient ID: Sara Aguirre, female    DOB: January 26, 1941, 73 y.o.   MRN: [id number] This chart was scribed for Zuehl. Laney Pastor, MD by Steva Colder, ED Scribe. The patient was seen in room 2 at 10:20 AM.   Chief Complaint  Patient presents with   Medication Refill    BP med   cholesterol check    HPI Sara Aguirre is a 73 y.o. female with a medical hx of HTN who presents today complaining of Blood Pressure Medication refill and cholesterol check. She denies being out of her BP medications. she states that she checks her blood pressure at home and the highest has been 165. She states that she did not take her BP medicine this am because she was getting her cholesterol checked. She states that she has lost 25 lbs. She states that she has changed her diet to better her cholesterol. She states that she is still active. Gardens,farms etc.She states that her son told her that she is leaning to the right when walks. Still bowling 3x a week. She denies visual disturbance, numbness, and any other associated symptoms.   Random home blood pressures have ranged from 110/68-165 over x ? Time for cuff calibration  She refuses all vaccinations.     Patient Active Problem List   Diagnosis Date Noted   Thyroid nodule 08/20/2013   Other and unspecified hyperlipidemia 08/20/2013   HTN (hypertension) 08/20/2013   BMI 27.0-27.9,adult 08/20/2013   Gastric ulcer 12/29/2011   Renal insufficiency 12/29/2011   History of bladder cancer, s/p cystectomy with ileal conduit    Past Medical History  Diagnosis Date   Ulcer, gastric, acute or chronic    Cancer     bladder, s/p cystectomy with ileal conduit   History of bladder cancer    History of tobacco use    Hyperlipidemia    Parastomal hernia of ileal conduit, s/p lap repair [id number] 12/04/2011   Incarcerated ventral hernia, s/p lap repair 12/05/2011   Hypertension    Past Surgical History  Procedure Laterality  Date   Bladder removal  10/1993    Dr. Risa Grill   Parastomal hernia repair  12/05/2011    Procedure: HERNIA REPAIR PARASTOMAL;  Surgeon: Adin Hector, MD;  Location: [facility];  Service: General;;  Laprascopic lysis of adhestons with reduction and repair with biological mesh for incarcerated parastomal and ventral long incisional hernia.   Ventral hernia repair  12/05/2011    Procedure: HERNIA REPAIR VENTRAL ADULT;  Surgeon: Adin Hector, MD;  Location: [facility];  Service: General;;   Hernia repair  12/05/11    parastomal   Revision urostomy cutaneous     Allergies  Allergen Reactions   Loperamide Hcl Rash   Prior to Admission medications   Medication Sig Start Date End Date Taking? Authorizing Provider  Calcium Carbonate-Vitamin D (CALCIUM + D PO) Take by mouth.   Yes Historical Provider, MD  losartan-hydrochlorothiazide (HYZAAR) 50-12.5 MG per tablet Take 1 tablet by mouth daily. PATIENT NEEDS OFFICE VISIT FOR ADDITIONAL REFILLS 08/24/14  Yes Leandrew Koyanagi, MD  Multiple Vitamin (MULTIVITAMIN) tablet Take 1 tablet by mouth daily.   Yes Historical Provider, MD      Review of Systems  Constitutional: Negative for activity change, appetite change and fatigue.  HENT: Negative for trouble swallowing.   Eyes: Negative for visual disturbance.  Respiratory: Negative for chest tightness and shortness of breath.   Cardiovascular: Negative for chest pain,  palpitations and leg swelling.  Gastrointestinal: Negative for abdominal pain, diarrhea and constipation.  Genitourinary: Negative for difficulty urinating.       Still ileal loop post bladder ca  Musculoskeletal: Negative for arthralgias and myalgias.  Neurological: Negative for speech difficulty and headaches.  Psychiatric/Behavioral: Negative for behavioral problems, sleep disturbance and decreased concentration.      Noncontributory.   Objective:   Physical Exam  Nursing note and vitals reviewed. Constitutional: She is  oriented to person, place, and time. She appears well-developed and well-nourished. No distress.  HENT:  Head: Normocephalic and atraumatic.  Eyes: EOM are normal. Pupils are equal, round, and reactive to light.  Neck: Neck supple.  See hx thy nodule w/ nl labs  Cardiovascular: Normal rate, regular rhythm and normal heart sounds.  Exam reveals no gallop.   No murmur heard. No carotid bruits. 2+ patella reflexes.   Pulmonary/Chest: Effort normal. No respiratory distress.  Genitourinary:  Ileal pouch is intact.   Musculoskeletal: Normal range of motion. She exhibits no edema.  Neurological: She is alert and oriented to person, place, and time. No cranial nerve deficit.  Skin: Skin is warm and dry.  Psychiatric: She has a normal mood and affect. Her behavior is normal. Judgment and thought content normal.   Wt Readings from Last 3 Encounters:  09/04/14 166 lb (75.297 kg)  08/20/13 174 lb 12.8 oz (79.289 kg)  12/13/12 200 lb (90.719 kg)      BP 240/122   Pulse 74   Temp(Src) 98.5 F (36.9 C) (Oral)   Resp 18   Ht 5' 7.5" (1.715 m)   Wt 166 lb (75.297 kg)   BMI 25.60 kg/m2   SpO2 96%  210/100 at recheck  Assessment & Plan:  I personally performed the services described in this documentation, which was scribed in my presence. The recorded information has been reviewed and is accurate.  Essential hypertension - Plan: CBC with Differential, Comprehensive metabolic panel  Concerning because she is so out of control but there is no physical finding or symptoms connected to this today  Will increase home monitoring after increasing losartan to 100/12.5  History of bladder cancer, s/p cystectomy with ileal conduit - Plan: POCT UA - Microscopic Only, POCT urinalysis dipstick  Other and unspecified hyperlipidemia - Plan: Lipid panel/off medication  Renal insufficiency-recheck  Abnormal LFTs-recheck  Meds ordered this encounter  Medications   losartan-hydrochlorothiazide (HYZAAR)  100-12.5 MG per tablet    Sig: Take 1 tablet by mouth daily.    Dispense:  90 tablet    Refill:  3   Notify labs

## 2014-09-07 ENCOUNTER — Encounter: Payer: Self-pay | Admitting: Internal Medicine

## 2014-09-21 ENCOUNTER — Telehealth: Payer: Self-pay

## 2014-09-21 NOTE — Telephone Encounter (Signed)
Pt was seen a few weeks ago by Dr. Laney Pastor. He changed her blood pressure medicine at that appt. Mrs. Stours would like to speak with him about this change because she does not think the new medication is working, She just did a blood pressure reading after waking up from a nap and it read: 200 over 94

## 2014-09-21 NOTE — Telephone Encounter (Signed)
See call--would you call and tell her we need to add another BP med--amlodipine 5 mg(#30) 1qd 2 refills--call results of BPs after 4 days

## 2014-09-23 MED ORDER — AMLODIPINE BESYLATE 5 MG PO TABS: 5.0000 mg | ORAL_TABLET | Freq: Every day | ORAL | Status: DC

## 2014-09-23 NOTE — Telephone Encounter (Signed)
Sent in Rx and called pt. LMOM w/details about new med and inst'd for calling after 4 days w/readings. Asked pt to call and let me know she got my message and understands.

## 2014-09-23 NOTE — Telephone Encounter (Signed)
Pt called back and LM on VM that she had gotten my message about her new Rx.

## 2014-09-28 ENCOUNTER — Other Ambulatory Visit: Payer: Self-pay | Admitting: Internal Medicine

## 2014-09-28 DIAGNOSIS — I10 Essential (primary) hypertension: Secondary | ICD-10-CM

## 2014-09-28 MED ORDER — AMLODIPINE BESYLATE 10 MG PO TABS: 10.0000 mg | ORAL_TABLET | Freq: Every day | ORAL | Status: DC

## 2014-09-28 NOTE — Progress Notes (Signed)
Home BPs after 5 amlodip added = 150/85 to 176/89 Will increase to 10mg   She has changed to vegetarian diet to try and avoid statins  Meds ordered this encounter  Medications  . amLODipine (NORVASC) 10 MG tablet    Sig: Take 1 tablet (10 mg total) by mouth daily.    Dispense:  30 tablet    Refill:  2

## 2014-09-29 ENCOUNTER — Telehealth: Payer: Self-pay

## 2014-09-29 NOTE — Telephone Encounter (Signed)
Patient is returning a call to [facility]

## 2014-10-04 ENCOUNTER — Encounter: Payer: Self-pay | Admitting: Internal Medicine

## 2014-10-05 NOTE — Telephone Encounter (Signed)
You were to add amlodipine 5mg  and call or send results of daily BPs after 4-8 days to see if we needed to go up more

## 2014-10-26 ENCOUNTER — Telehealth: Payer: Self-pay | Admitting: Internal Medicine

## 2014-10-26 MED ORDER — METOPROLOL SUCCINATE ER 25 MG PO TB24: 25.0000 mg | ORAL_TABLET | Freq: Every day | ORAL | Status: DC

## 2014-10-26 NOTE — Telephone Encounter (Signed)
BPs on 10amlodip and losart100/12.5 are 140-150s /80s Pulses mid range Will add metoprolol next

## 2014-10-29 NOTE — Telephone Encounter (Signed)
LM to advise pt.

## 2014-11-01 ENCOUNTER — Encounter: Payer: Self-pay | Admitting: Internal Medicine

## 2014-11-26 ENCOUNTER — Telehealth: Payer: Self-pay

## 2014-11-26 NOTE — Telephone Encounter (Signed)
[facility] sent order for ostomy supplies. I have put it in Dr Doolittle's box.

## 2014-12-03 ENCOUNTER — Telehealth: Payer: Self-pay

## 2014-12-03 NOTE — Telephone Encounter (Signed)
Spoke to patient regarding flu shot.  Patient states that she does not take the flu shot.

## 2015-03-07 ENCOUNTER — Encounter: Payer: Self-pay | Admitting: Internal Medicine

## 2015-03-11 MED ORDER — AMLODIPINE BESYLATE 10 MG PO TABS: 10.0000 mg | ORAL_TABLET | Freq: Every day | ORAL | Status: DC

## 2015-03-11 NOTE — Telephone Encounter (Signed)
Meds ordered this encounter  Medications  . amLODipine (NORVASC) 10 MG tablet    Sig: Take 1 tablet (10 mg total) by mouth daily.    Dispense:  90 tablet    Refill:  3

## 2015-03-11 NOTE — Addendum Note (Signed)
Addended by: Leandrew Koyanagi on: 03/11/2015 05:50 PM   Modules accepted: Orders

## 2015-09-29 ENCOUNTER — Ambulatory Visit (INDEPENDENT_AMBULATORY_CARE_PROVIDER_SITE_OTHER): Payer: Medicare Other | Admitting: Family Medicine

## 2015-09-29 VITALS — BP 178/100 | HR 55 | Temp 97.8°F | Resp 16 | Ht 68.0 in | Wt 161.0 lb

## 2015-09-29 DIAGNOSIS — I10 Essential (primary) hypertension: Secondary | ICD-10-CM | POA: Diagnosis not present

## 2015-09-29 DIAGNOSIS — E785 Hyperlipidemia, unspecified: Secondary | ICD-10-CM | POA: Diagnosis not present

## 2015-09-29 LAB — LIPID PANEL
CHOL/HDL RATIO: 4.2 ratio (ref <=5.0)
Cholesterol: 241 mg/dL — ABNORMAL HIGH (ref 125–200)
HDL: 57 mg/dL (ref >=46)
LDL CALC: 141 mg/dL — AB (ref <130)
TRIGLYCERIDES: 217 mg/dL — AB (ref <150)
VLDL: 43 mg/dL — AB (ref <30)

## 2015-09-29 LAB — COMPLETE METABOLIC PANEL WITH GFR
ALT: 12 U/L (ref 6–29)
AST: 18 U/L (ref 10–35)
Albumin: 4 g/dL (ref 3.6–5.1)
Alkaline Phosphatase: 65 U/L (ref 33–130)
BUN: 19 mg/dL (ref 7–25)
CALCIUM: 9.1 mg/dL (ref 8.6–10.4)
CHLORIDE: 102 mmol/L (ref 98–110)
CO2: 27 mmol/L (ref 20–31)
CREATININE: 1.46 mg/dL — AB (ref 0.60–0.93)
GFR, EST AFRICAN AMERICAN: 40 mL/min — AB (ref >=60)
GFR, EST NON AFRICAN AMERICAN: 35 mL/min — AB (ref >=60)
Glucose, Bld: 89 mg/dL (ref 65–99)
POTASSIUM: 4.1 mmol/L (ref 3.5–5.3)
Sodium: 140 mmol/L (ref 135–146)
Total Bilirubin: 0.6 mg/dL (ref 0.2–1.2)
Total Protein: 7.2 g/dL (ref 6.1–8.1)

## 2015-09-29 MED ORDER — LOSARTAN POTASSIUM-HCTZ 100-25 MG PO TABS: 1.0000 | ORAL_TABLET | Freq: Every day | ORAL | Status: DC

## 2015-09-29 MED ORDER — METOPROLOL SUCCINATE ER 25 MG PO TB24: 25.0000 mg | ORAL_TABLET | Freq: Every day | ORAL | Status: DC

## 2015-09-29 NOTE — Patient Instructions (Signed)
Change to losartan HCT 100/25  Take the metoprolol 25 mg daily rather than the half pill that you have been taking  Continue to get a lot of regular exercise  Reconsider the flu shot  Plan to return in about 3-4 months for a recheck with Sara Aguirre or one of the other doctors.

## 2015-09-29 NOTE — Addendum Note (Signed)
Addended by: Constance Goltz on: 09/29/2015 09:04 AM   Modules accepted: Miquel Dunn

## 2015-09-29 NOTE — Progress Notes (Signed)
Patient ID: LORNA STROTHER, female    DOB: July 13, 1941  Age: 74 y.o. MRN: [id number]  Chief Complaint  Patient presents with  . Follow-up  . Hypertension  . Medication Refill    losartan, metoprol    Subjective:   74 year old lady here for blood pressure check and medicine refill. She is taking her losartan HCT in the evening and has cut her metoprolol to one half of a pill and has quit taking her amlodipine. She monitors her blood pressures carefully. She has been very careful what she eats and she gets a lot of exercise. She brought in blood pressure readings from home which run from 130s over upper 70s to as high as 150s over 90. She says this is just white coat hypertension that she has. Most of the systolic values are above 140. She has lost some weight. She says is going to get where she doesn't have to have any medicines.  She adamantly refuses a flu shot, says she doesn't get the flu.  Current allergies, medications, problem list, past/family and social histories reviewed.  Objective:  BP 178/100 mmHg  Pulse 55  Temp(Src) 97.8 F (36.6 C) (Oral)  Resp 16  Ht 5' 8"  (1.727 m)  Wt 161 lb (73.029 kg)  BMI 24.49 kg/m2  SpO2 99%  No acute distress. Healthy appearing. Chest clear. Heart regular without murmurs. Her previous cholesterol was quite high.  Assessment & Plan:   Assessment: 1. Essential hypertension   2. Hyperlipidemia       Plan: Talk long and hard about the need for getting a flu shot, which she still declines  Check lipids and C met  Increase the losartan HCT to 100/25 and told her to take a whole pill of her metoprolol tartrate 25 daily. Return to see Dr. Laney Pastor in about 4 months. Suggested she bring in her blood pressure cuff so we can compare.  Orders Placed This Encounter  Procedures  . Lipid panel  . COMPLETE METABOLIC PANEL WITH GFR    Meds ordered this encounter  Medications  . metoprolol succinate (TOPROL-XL) 25 MG 24 hr tablet    Sig:  Take 1 tablet (25 mg total) by mouth daily.    Dispense:  90 tablet    Refill:  3  . losartan-hydrochlorothiazide (HYZAAR) 100-25 MG tablet    Sig: Take 1 tablet by mouth daily.    Dispense:  90 tablet    Refill:  3         Patient Instructions  Change to losartan HCT 100/25  Take the metoprolol 25 mg daily rather than the half pill that you have been taking  Continue to get a lot of regular exercise  Reconsider the flu shot  Plan to return in about 3-4 months for a recheck with Dr. Laney Pastor or one of the other doctors.    Return in about 4 months (around 01/30/2016), or if symptoms worsen or fail to improve.   HOPPER,DAVID, MD 09/29/2015

## 2015-11-15 ENCOUNTER — Encounter: Payer: Self-pay | Admitting: Internal Medicine

## 2016-01-18 ENCOUNTER — Other Ambulatory Visit: Payer: Self-pay

## 2016-01-18 DIAGNOSIS — I10 Essential (primary) hypertension: Secondary | ICD-10-CM

## 2016-01-18 MED ORDER — METOPROLOL SUCCINATE ER 25 MG PO TB24: 25.0000 mg | ORAL_TABLET | Freq: Every day | ORAL | Status: DC

## 2016-01-18 MED ORDER — LOSARTAN POTASSIUM-HCTZ 100-25 MG PO TABS: 1.0000 | ORAL_TABLET | Freq: Every day | ORAL | Status: DC

## 2016-02-23 ENCOUNTER — Telehealth: Payer: Self-pay

## 2016-02-23 NOTE — Telephone Encounter (Signed)
Pt is checking on status of a request from [facility] for supplies    Best number [phone]

## 2016-02-23 NOTE — Telephone Encounter (Signed)
I returned VM from [place] who had called checking status of [facility] ostomy supplies earlier. I checked Dr Laney Pastor and [employer], nurses' box, scan box. There is no indication that we ever received an order form. I had to [facility] for [facility] and I advised him that there is no record of any form having been received and suggested he have [place] fax [place] an order form for what is needed. Left him both fax #s at [phone].

## 2016-02-25 NOTE — Telephone Encounter (Signed)
Pola from [place] [employer] called checking the status of request// provided fax #'s for 2nd attempt [phone].Marland KitchenMarland Kitchen

## 2016-06-12 ENCOUNTER — Telehealth: Payer: Self-pay

## 2016-06-12 NOTE — Telephone Encounter (Signed)
Spoke with Anderson Malta @ [facility]  for PA urostomy pouch per faxed request from [employer].  Per Anderson Malta, request MUST come from [facility] provider, not Medical Provider.   Called [place] @ [phone] - they will resubmit w/their PA dept.

## 2016-06-26 ENCOUNTER — Telehealth: Payer: Self-pay

## 2016-06-26 NOTE — Telephone Encounter (Signed)
Patient is a former patient of Laney Pastor. Patient stated Dr. Tamala Julian was assigned to be her new PCP. Patient stated she is trying to get medical supplies. [employer] stated they are waiting for authorization from a doctor. [phone].

## 2016-06-27 NOTE — Telephone Encounter (Signed)
Awaiting fax.

## 2016-06-28 NOTE — Telephone Encounter (Signed)
I received form and called [facility] to clarify what is needed. Called [employer] to get referral/authorization and was advised that [employer] handles this pt's plan. Called [employer] at [phone] and was advised that provider's office can not do any authorization. What needs to be done is that a form has to be submitted by the med supply company. I asked [employer] to fax [facility] the correct form, and then called [facility] back to give status. They checked with their prior auth department while I held on phone. Nothing further needed from [place] unless we are notified there is still a problem.

## 2016-07-07 NOTE — Telephone Encounter (Signed)
Received another notice from [place] asking for [place] to call [employer] and do a referral. I have already been advised by [employer] that the provider can not do this, see notes below. Called [facility] back to see what can be done to ensure this pt gets her supplies. If Denzil Hughes can not get this done, I will recommend to pt that she find another supply company. The [facility] rep stated that [facility] requires prior auths on 75% of their orders and that is why they sent [place] the faxes asking for this to be done. It is not because [employer] denied coverage. Rep stated that she will go ahead and send this order out to pt based on what I was told by [employer] and if they deny it, they will have to hold the next order. I called pt and updated her on status and advised her to call me back if she doesn't receive her supplies within a few days.

## 2016-09-13 ENCOUNTER — Telehealth: Payer: Self-pay

## 2016-09-13 NOTE — Telephone Encounter (Signed)
Received DME prior authorization for [facility] Schimke for ostomy supplies. Placed letter in your box.

## 2016-09-15 NOTE — Telephone Encounter (Signed)
Opened in error, duplicate, will close

## 2016-09-15 NOTE — Telephone Encounter (Signed)
[facility] called to check on status of order. They stated they can not send out supplies until completed.

## 2016-09-16 IMAGING — US US THYROID
1 series · 14 of 25 positions shown · non-contrast
Comparison: [DATE]

CLINICAL DATA: Nodules.  FNA biopsy of left mid nodule [DATE].

EXAM:
THYROID ULTRASOUND
TECHNIQUE: Ultrasound examination of the thyroid gland and adjacent soft
tissues was performed.

[Series 1: us thyroid · 0.06mm/px · 14 of 51 slices shown]
[im 1/51]
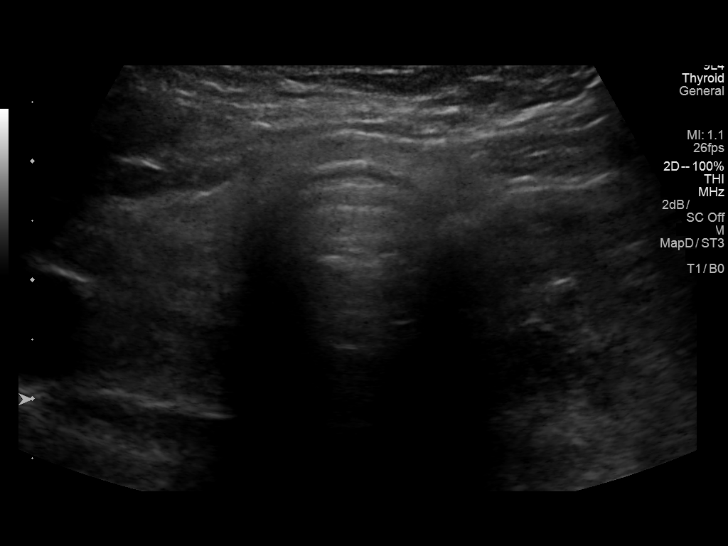
[im 5/51]
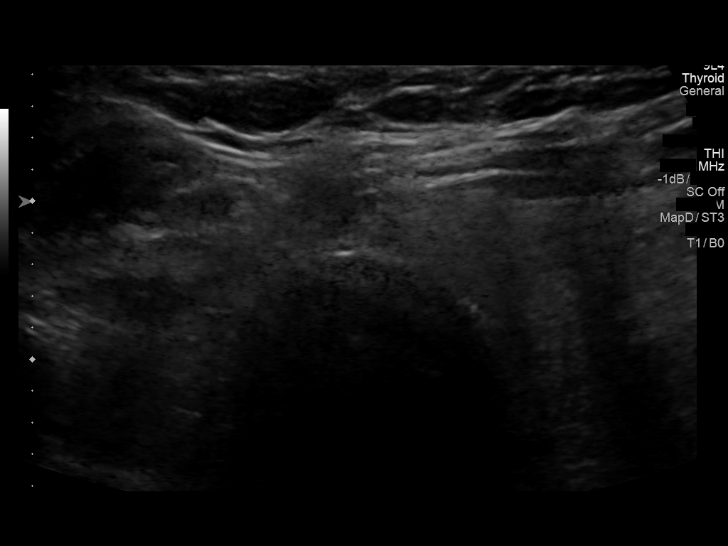
[im 9/51]
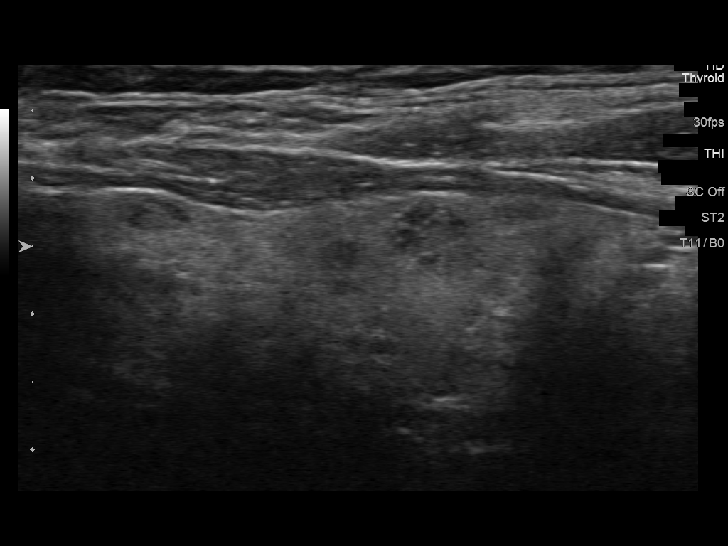
[im 13/51]
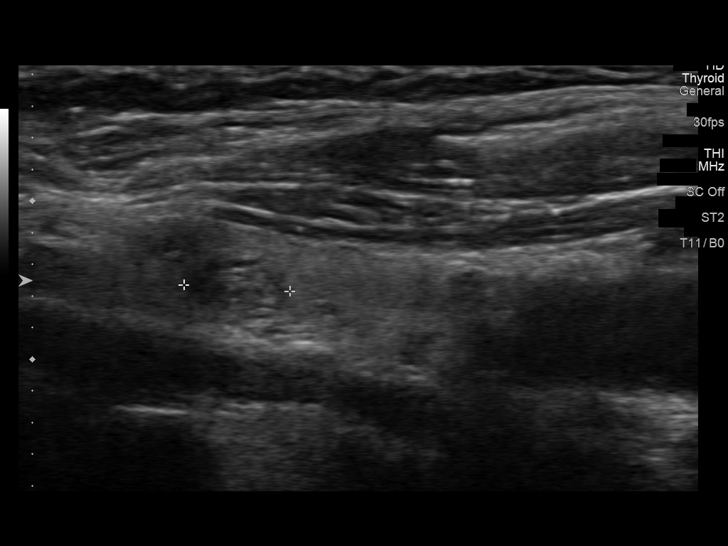
[im 17/51]
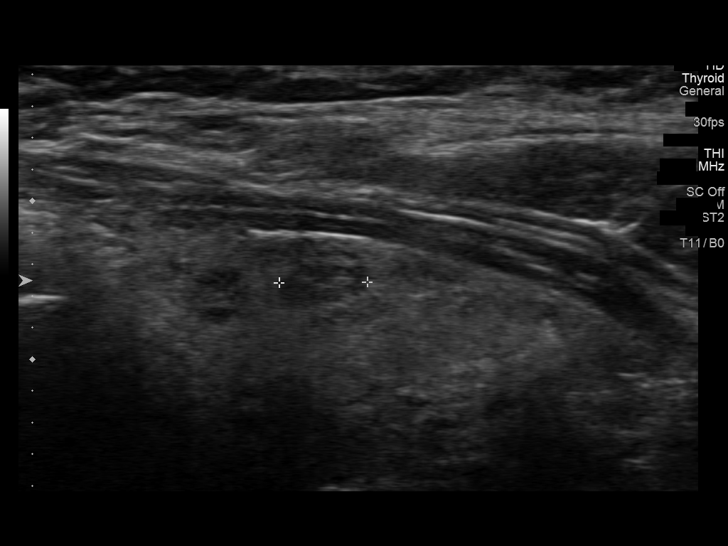
[im 19/51]
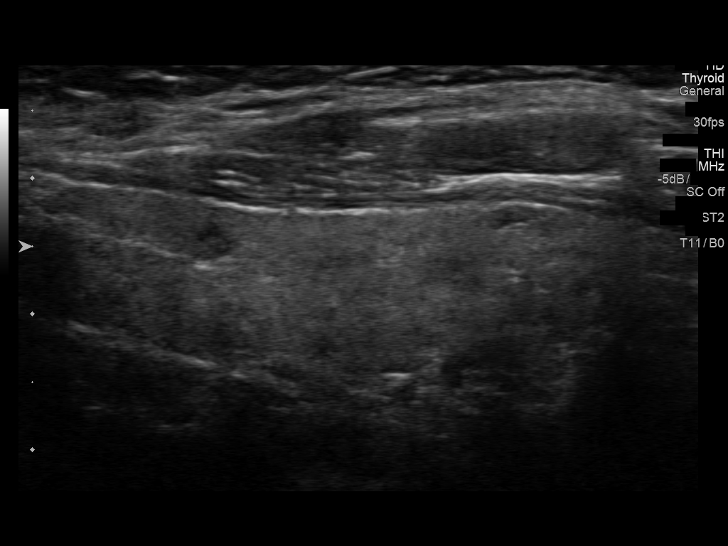
[im 23/51]
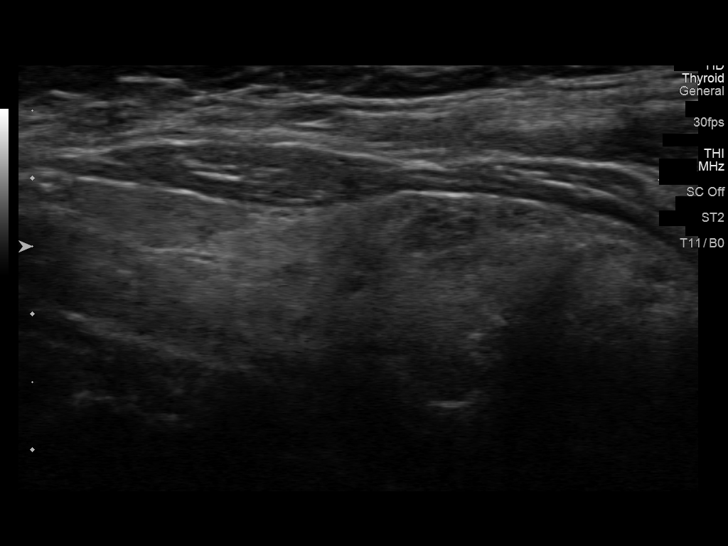
[im 28/51]
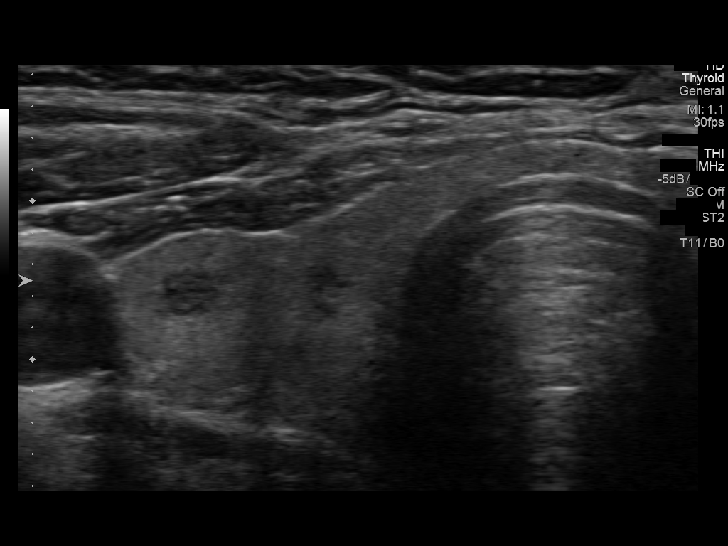
[im 32/51]
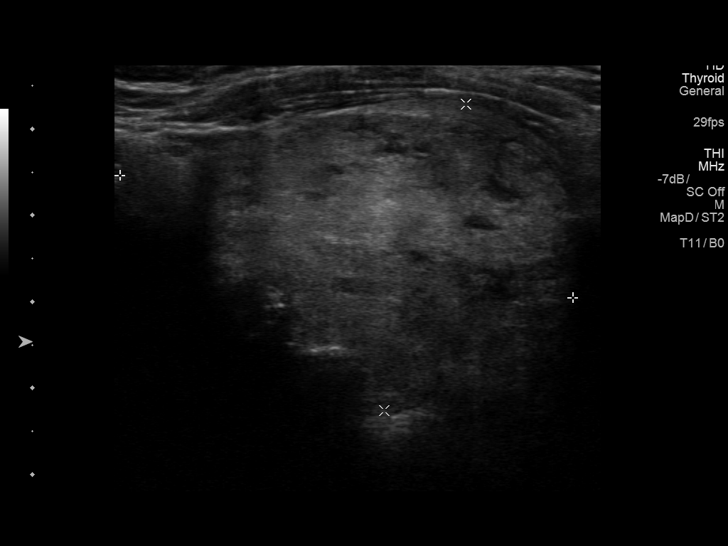
[im 34/51]
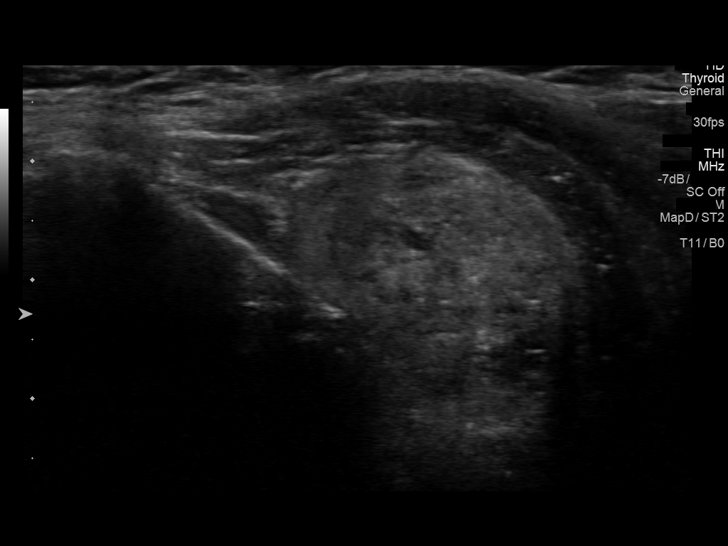
[im 38/51]
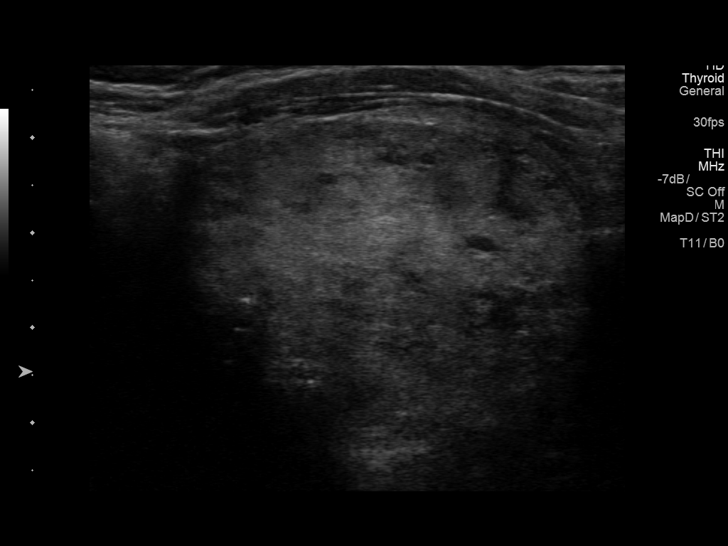
[im 42/51]
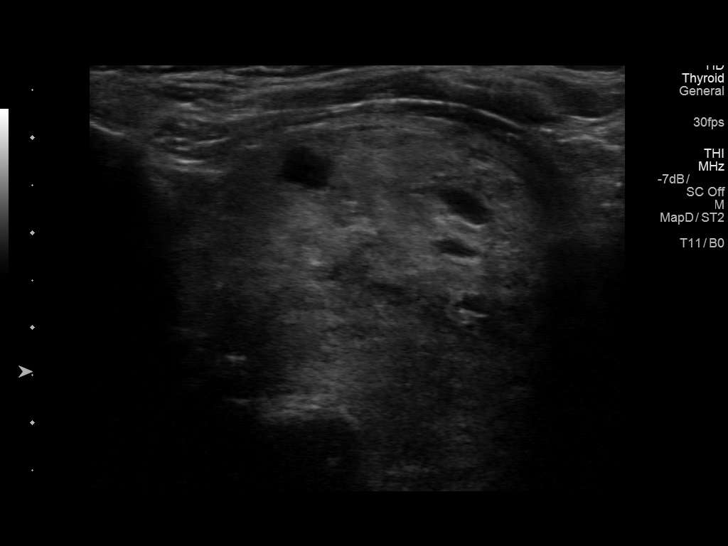
[im 46/51]
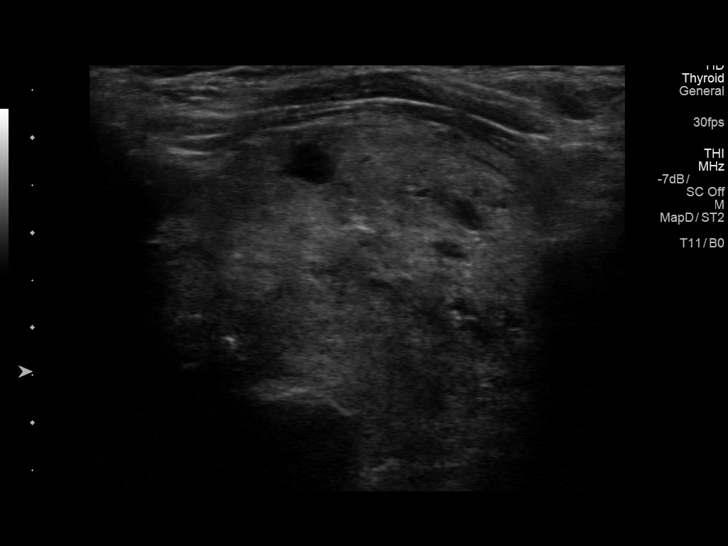
[im 51/51]
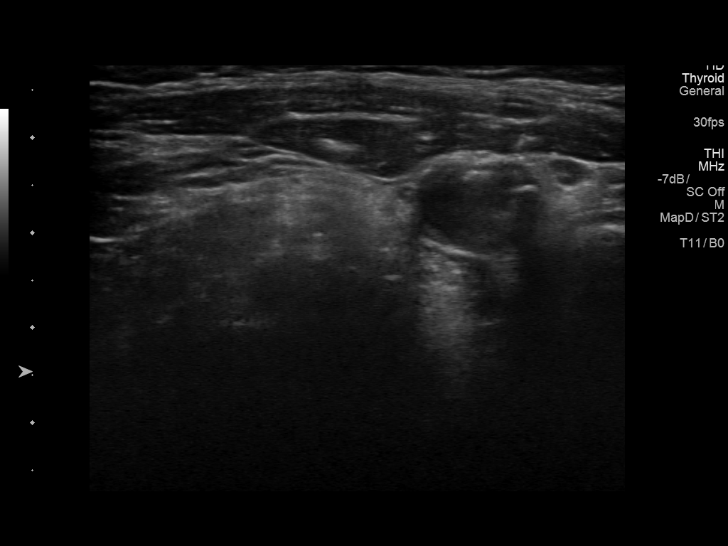

[14 of 25 positions shown; findings below may reference images not displayed]

FINDINGS: Parenchymal Echotexture: Moderately heterogenous

Isthmus: 0.3 cm thickness, previously

Right lobe: 5 x 1.3 x 1.9 cm (previously 5.4 x 1.2 x 1.3)

Left lobe: 5.4 x 3.6 x4 cm (previously 5.4 x 2.3 x 2.2)

_________________________________________________________

Estimated total number of nodules >/= 1 cm: 1

Number of spongiform nodules >/=  2 cm not described below (TR1): 0

Number of mixed cystic and solid nodules >/= 1.5 cm not described
below (TR2): 0

4.2 x 3 x 4 cm mid left nodule, previously 2.5 x 2.2 x 2 cm. This
was previously biopsied.

Multiple bilateral small hypoechoic nodules without calcifications ,
largest 8 mm in the inferior right lobe.
IMPRESSION: 1. Asymmetric thyromegaly with bilateral nodules
2. Some interval enlargement of mid left nodule, previously
biopsied.
3. Additional nodules do not meet criteria for biopsy or dedicated
follow-up.

The above is in keeping with the ACR TI-RADS recommendations - [HOSPITAL] [ON];[DATE].

## 2016-09-21 NOTE — Telephone Encounter (Signed)
Pt also called to check to see if we can do anything to get her supplies to her. We had a problem last month, but finally got it resolved and pt got them last month. The form I put in Dr Thompson Caul box will hopefully be all that is needed.

## 2016-10-10 NOTE — Telephone Encounter (Signed)
[place] calling again, states that a form was faxed and they need that signed/confirmed before they can send out the supplies. Please call to update on the status of that. [phone] [id number] Reference #[id number]  Will also need to fill out [employer] auth but I cannot do that without the form as it has info on it that I need.   Thanks!

## 2016-10-10 NOTE — Telephone Encounter (Signed)
Talked with pt and advised her that we had gotten two faxes about her supplies which we have faxed back. Discussed the problems we had getting her order last time with [facility]. She gave me the name of the last comp she ordered from in case we can not get the order from [place], but don't know if they take [employer]: [employer], [place], [place], [place] [address], [phone].  I was able to find the faxed order that was faxed back to [place] on 10/20 w/confirmation, and a form that we faxed back to [employer] entitled "DME Pre-Authorization Request Form" also confirmed that it went through to [facility] on 10/20. Called [facility] to check status and find out what else needs to be done. Pam from [employer] found a fax from [employer] stating that no authorization is needed. Pam stated that she will get this info to shipping to send out tomorrow, and it is normally a 1 day shipping to pt.  Tried to call pt to give status but could not get answer or VM.

## 2016-10-11 NOTE — Telephone Encounter (Signed)
Called and advised pt of status. Suggested that she f/up w/[facility] today just to make sure that they did get her supplies shipped out. Pt stated she had planned to do this anyway, and thanked me for going above and beyond.

## 2017-01-13 IMAGING — US US THYROID BIOPSY
1 series · 12 of 12 positions shown · non-contrast
Comparison: Thyroid ultrasound performed [DATE]

MEDICATIONS:
None

COMPLICATIONS:
None immediate.

INDICATION: Indeterminate left thyroid nodule.  Previously biopsied [DATE]

EXAM:
ULTRASOUND GUIDED FINE NEEDLE ASPIRATION OF INDETERMINATE LEFT
THYROID NODULE
TECHNIQUE: Informed written consent was obtained from the patient after a
discussion of the risks, benefits and alternatives to treatment.
Questions regarding the procedure were encouraged and answered. A
timeout was performed prior to the initiation of the procedure.

[Series 1: us thyroid biopsy · 0.07mm/px · 12 acquisitions, 12 frames shown]
[im 1/12]
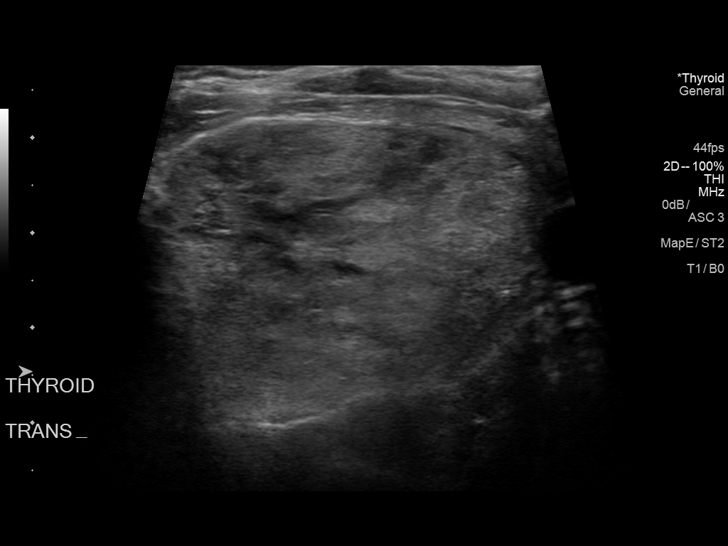
[im 2/12]
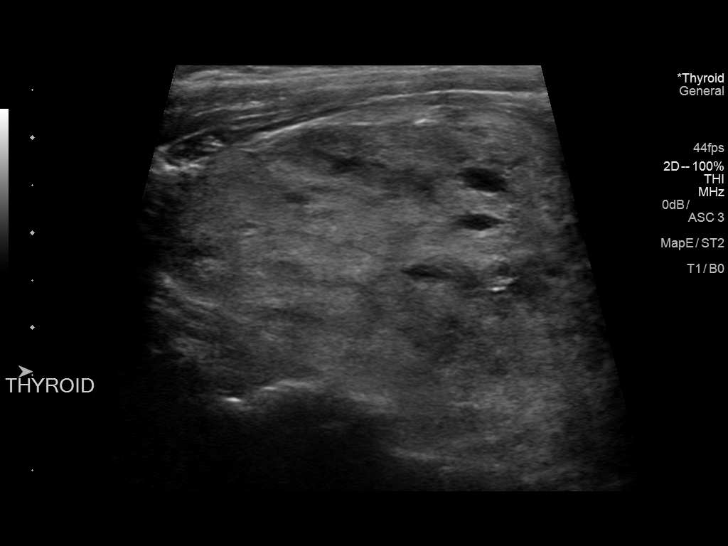
[im 3/12]
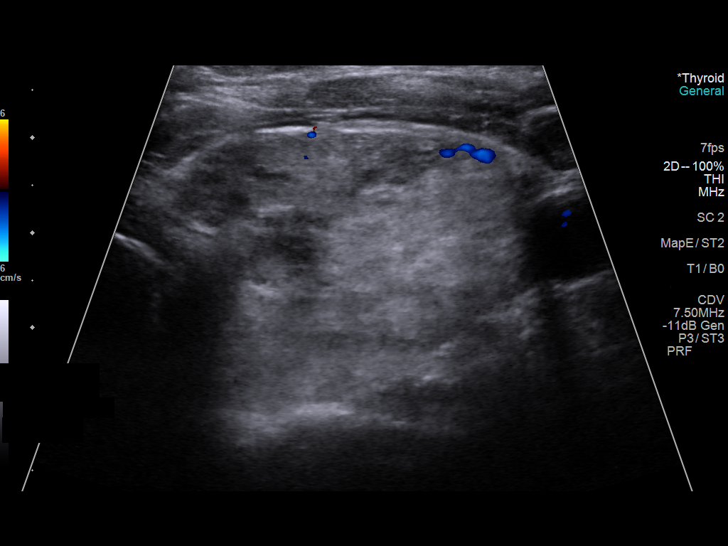
[im 4/12]
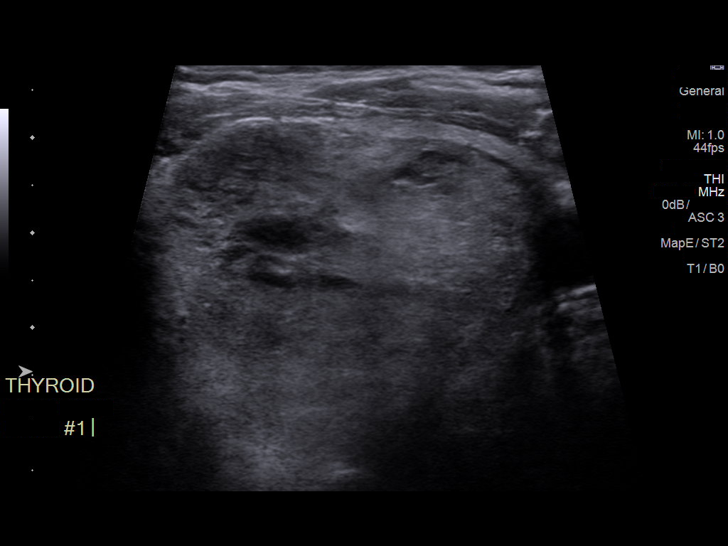
[im 5/12]
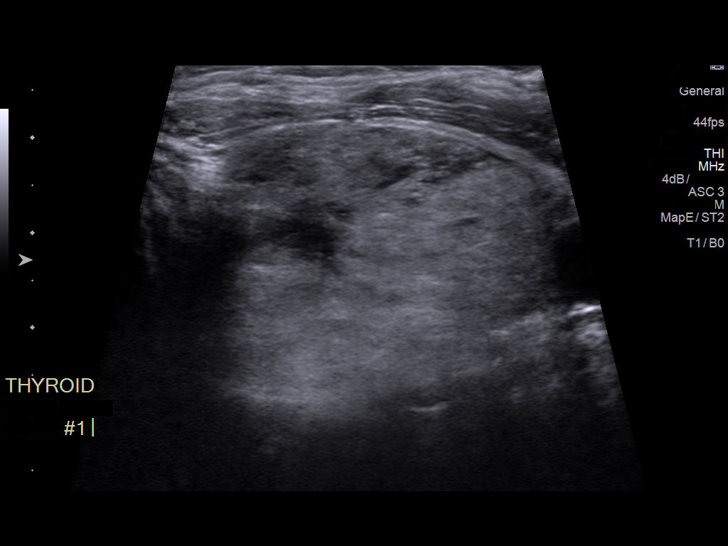
[im 6/12]
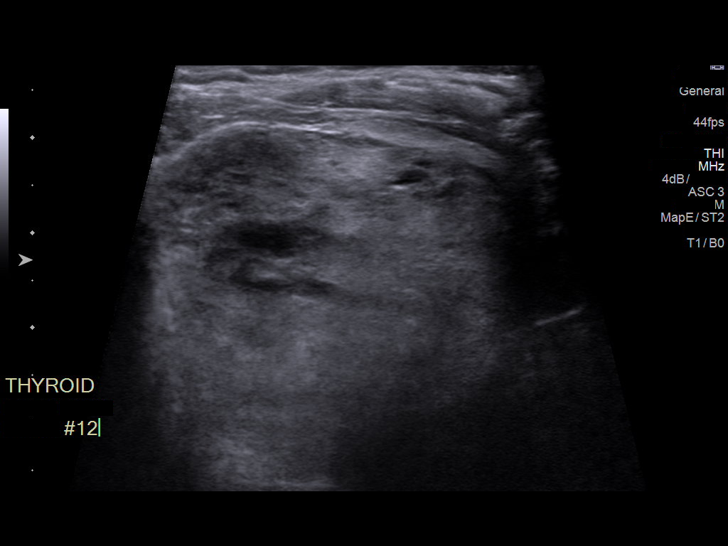
[im 7/12]
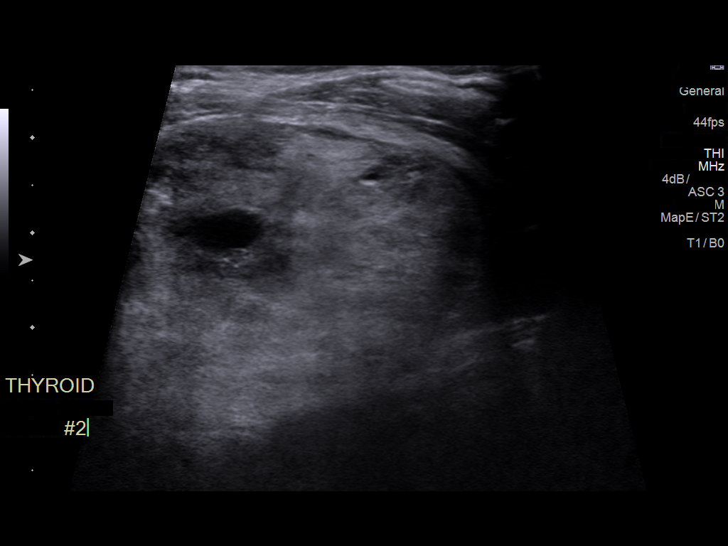
[im 8/12]
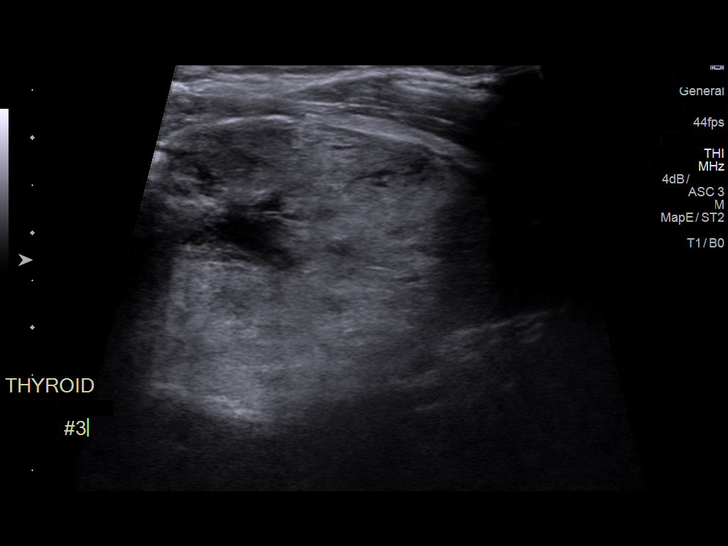
[im 9/12]
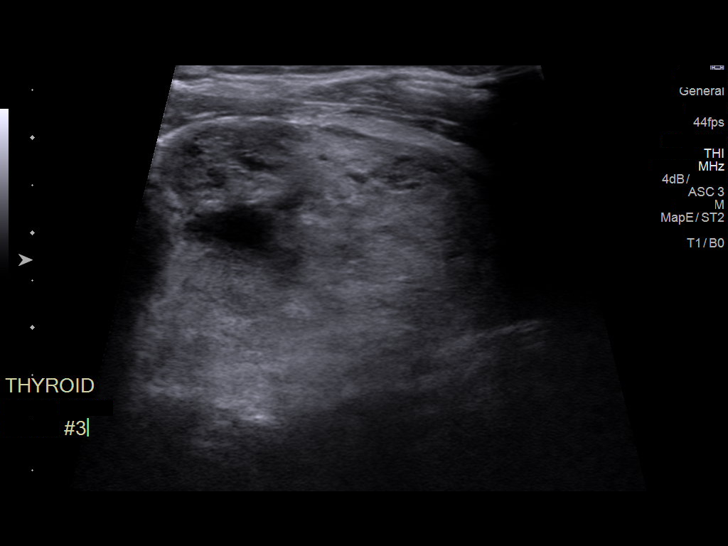
[im 10/12]
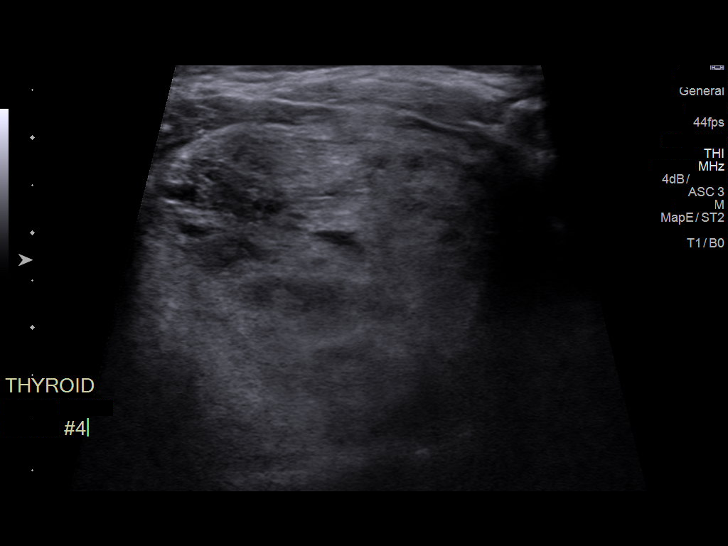
[im 11/12]
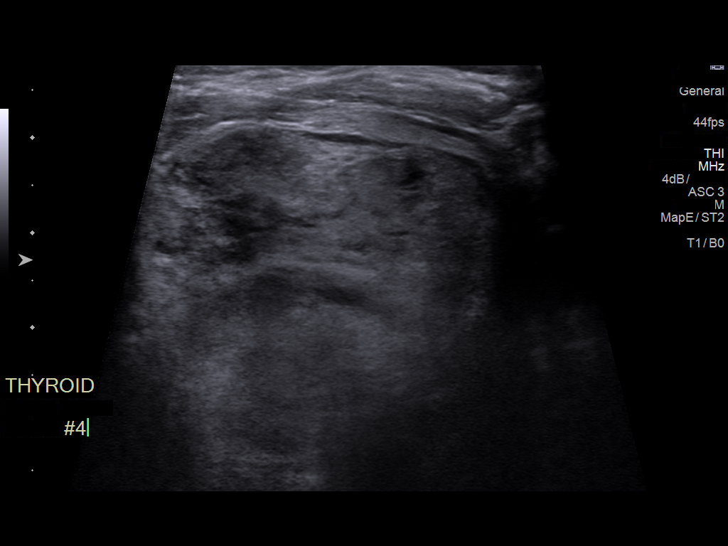
[im 12/12]
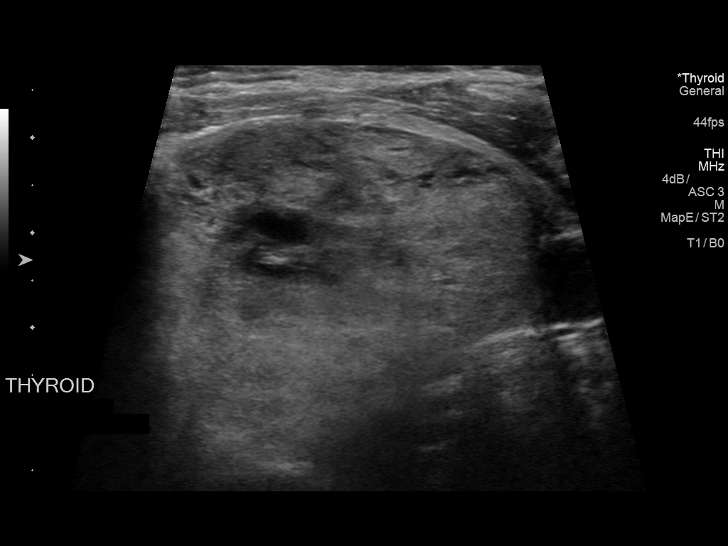

[12 of 12 positions shown; findings below may reference images not displayed]

Pre-procedural ultrasound scanning demonstrated a dominant left mid
thyroid nodule measuring approximately 4.2 x 3 x 4 cm. This is noted
to be enlarged from previous measurements.

The procedure was planned. The neck was prepped in the usual sterile
fashion, and a sterile drape was applied covering the operative
field. A timeout was performed prior to the initiation of the
procedure. Local anesthesia was provided with 1% lidocaine.

Under direct ultrasound guidance, 4 FNA biopsies were performed of
the left mid thyroid nodule with a 25 gauge needle. The samples were
prepared and submitted to pathology.

Limited post procedural scanning was negative for hematoma or
additional complication. Dressings were placed. The patient
tolerated the above procedures procedure well without immediate
postprocedural complication.
IMPRESSION: Technically successful ultrasound guided fine needle aspiration of
left mid thyroid nodule as described above.

## 2017-01-17 ENCOUNTER — Ambulatory Visit (INDEPENDENT_AMBULATORY_CARE_PROVIDER_SITE_OTHER): Payer: PPO | Admitting: Family Medicine

## 2017-01-17 ENCOUNTER — Encounter: Payer: Self-pay | Admitting: Family Medicine

## 2017-01-17 VITALS — BP 198/98 | HR 95 | Temp 97.7°F | Resp 16 | Ht 68.0 in | Wt 169.0 lb

## 2017-01-17 DIAGNOSIS — Z23 Encounter for immunization: Secondary | ICD-10-CM

## 2017-01-17 DIAGNOSIS — Z8551 Personal history of malignant neoplasm of bladder: Secondary | ICD-10-CM

## 2017-01-17 DIAGNOSIS — E7800 Pure hypercholesterolemia, unspecified: Secondary | ICD-10-CM | POA: Insufficient documentation

## 2017-01-17 DIAGNOSIS — Z0000 Encounter for general adult medical examination without abnormal findings: Secondary | ICD-10-CM | POA: Diagnosis not present

## 2017-01-17 DIAGNOSIS — N289 Disorder of kidney and ureter, unspecified: Secondary | ICD-10-CM

## 2017-01-17 DIAGNOSIS — I10 Essential (primary) hypertension: Secondary | ICD-10-CM | POA: Diagnosis not present

## 2017-01-17 DIAGNOSIS — E2839 Other primary ovarian failure: Secondary | ICD-10-CM

## 2017-01-17 DIAGNOSIS — E041 Nontoxic single thyroid nodule: Secondary | ICD-10-CM

## 2017-01-17 MED ORDER — LOSARTAN POTASSIUM-HCTZ 100-25 MG PO TABS: 1.0000 | ORAL_TABLET | Freq: Every day | ORAL | 1 refills | Status: DC

## 2017-01-17 MED ORDER — METOPROLOL SUCCINATE ER 50 MG PO TB24: 50.0000 mg | ORAL_TABLET | Freq: Every day | ORAL | 1 refills | Status: DC

## 2017-01-17 NOTE — Progress Notes (Signed)
Subjective:    Patient ID: Sara Aguirre, female    DOB: [birth year], 76 y.o.   MRN: [id number]  01/17/2017  Medication Refill (losartan potssium and metoprolol) and Hypertension (pt did not take medication this morning)   HPI This 77 y.o. female presents for follow-up for hypertension and for Annual Wellness Examination.   Last physical: over one year ago. Pap smear:  N/a; hysterectomy with nephrostomy. Mammogram:  Refuses; 1993 Colonoscopy:  Two in the past; 3 years ago; [place] Bone density:   With last mammogram; 1993 Eye exam:  Overdue; has cataract; has been waiting 12 years to get rid of it. Dental exam:  none   HTN: Patient reports good compliance with medication, good tolerance to medication, and good symptom control.  Home BP 140/88.  No medication this morning.  Took BP medication yesterday.  Bladder cancer: 1993; s/p removal of bladder; need ostomy supplies.  3 boxes every 2 months.  Thyroid nodule: present in the past; s/p thyroid ultrasound 12 years ago.   Immunization History  Administered Date(s) Administered  . Influenza,inj,Quad PF,36+ Mos 01/17/2017  . Influenza-Unspecified 10/07/2015  . Pneumococcal Conjugate-13 01/17/2017   BP Readings from Last 3 Encounters:  01/17/17 (!) 198/98  09/29/15 (!) 178/100  09/04/14 (!) 210/100   Wt Readings from Last 3 Encounters:  01/17/17 169 lb (76.7 kg)  09/29/15 161 lb (73 kg)  09/04/14 166 lb (75.3 kg)    Review of Systems  Constitutional: Negative for activity change, appetite change, chills, diaphoresis, fatigue, fever and unexpected weight change.  HENT: Negative for congestion, dental problem, drooling, ear discharge, ear pain, facial swelling, hearing loss, mouth sores, nosebleeds, postnasal drip, rhinorrhea, sinus pressure, sneezing, sore throat, tinnitus, trouble swallowing and voice change.   Eyes: Negative for photophobia, pain, discharge, redness, itching and visual disturbance.  Respiratory: Negative  for apnea, cough, choking, chest tightness, shortness of breath, wheezing and stridor.   Cardiovascular: Negative for chest pain, palpitations and leg swelling.  Gastrointestinal: Negative for abdominal distention, abdominal pain, anal bleeding, blood in stool, constipation, diarrhea, nausea, rectal pain and vomiting.  Endocrine: Negative for cold intolerance, heat intolerance, polydipsia, polyphagia and polyuria.  Genitourinary: Negative for decreased urine volume, difficulty urinating, dyspareunia, dysuria, enuresis, flank pain, frequency, genital sores, hematuria, menstrual problem, pelvic pain, urgency, vaginal bleeding, vaginal discharge and vaginal pain.  Musculoskeletal: Negative for arthralgias, back pain, gait problem, joint swelling, myalgias, neck pain and neck stiffness.  Skin: Negative for color change, pallor, rash and wound.  Allergic/Immunologic: Negative for environmental allergies, food allergies and immunocompromised state.  Neurological: Negative for dizziness, tremors, seizures, syncope, facial asymmetry, speech difficulty, weakness, light-headedness, numbness and headaches.  Hematological: Negative for adenopathy. Does not bruise/bleed easily.  Psychiatric/Behavioral: Negative for agitation, behavioral problems, confusion, decreased concentration, dysphoric mood, hallucinations, self-injury, sleep disturbance and suicidal ideas. The patient is not nervous/anxious and is not hyperactive.     Past Medical History:  Diagnosis Date  . Cancer [facility])    bladder, s/p cystectomy with ileal conduit  . History of bladder cancer   . History of tobacco use   . Hyperlipidemia   . Hypertension   . Incarcerated ventral hernia, s/p lap repair 12/05/2011  . Parastomal hernia of ileal conduit, s/p lap repair [id number] 12/04/2011  . Ulcer, gastric, acute or chronic    Past Surgical History:  Procedure Laterality Date  . BLADDER REMOVAL  10/1993   Dr. Risa Grill  . HERNIA REPAIR  12/05/11    parastomal  . PARASTOMAL HERNIA  REPAIR  12/05/2011   Procedure: HERNIA REPAIR PARASTOMAL;  Surgeon: Adin Hector, MD;  Location: [facility];  Service: General;;  Laprascopic lysis of adhestons with reduction and repair with biological mesh for incarcerated parastomal and ventral long incisional hernia.  Marland Kitchen REVISION UROSTOMY CUTANEOUS    . VENTRAL HERNIA REPAIR  12/05/2011   Procedure: HERNIA REPAIR VENTRAL ADULT;  Surgeon: Adin Hector, MD;  Location: [facility];  Service: General;;   Allergies  Allergen Reactions  . Loperamide Hcl Rash   Current Outpatient Prescriptions  Medication Sig Dispense Refill  . losartan-hydrochlorothiazide (HYZAAR) 100-25 MG tablet Take 1 tablet by mouth daily. 90 tablet 1  . metoprolol succinate (TOPROL-XL) 50 MG 24 hr tablet Take 1 tablet (50 mg total) by mouth daily. 90 tablet 1  . amLODipine (NORVASC) 10 MG tablet Take 1 tablet (10 mg total) by mouth daily. (Patient not taking: Reported on 09/29/2015) 90 tablet 3  . Calcium Carbonate-Vitamin D (CALCIUM + D PO) Take by mouth.    . Multiple Vitamin (MULTIVITAMIN) tablet Take 1 tablet by mouth daily.     No current facility-administered medications for this visit.    Social History   Social History  . Marital status: Widowed    Spouse name: N/A  . Number of children: N/A  . Years of education: N/A   Occupational History  . retired    Social History Main Topics  . Smoking status: Former Smoker    Quit date: 01/02/1993  . Smokeless tobacco: Never Used  . Alcohol use No  . Drug use: No  . Sexual activity: Not on file   Other Topics Concern  . 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  Gardening      ADLs: drives; independent with ADLs; has garden      Advanced Directives: YES: DNR/DNI; HCPOA: Orvis Brill or other son Cory Munch      Family History  Problem Relation Age of Onset  . Heart disease Mother 73    CHF  . Alcohol abuse Father   . Stroke Father        Objective:    BP (!) 198/98   Pulse 95   Temp 97.7 F (36.5 C) (Oral)   Resp 16   Ht 5\' 8"  (1.727 m)   Wt 169 lb (76.7 kg)   SpO2 97%   BMI 25.70 kg/m  Physical Exam  Constitutional: She is oriented to person, place, and time. She appears well-developed and well-nourished. No distress.  HENT:  Head: Normocephalic and atraumatic.  Right Ear: External ear normal.  Left Ear: External ear normal.  Nose: Nose normal.  Mouth/Throat: Oropharynx is clear and moist.  Eyes: Conjunctivae and EOM are normal. Pupils are equal, round, and reactive to light.  Neck: Normal range of motion and full passive range of motion without pain. Neck supple. No JVD present. Carotid bruit is not present. Thyromegaly present.  L>R thyroid enlargement.  Cardiovascular: Normal rate, regular rhythm, normal heart sounds and intact distal  pulses.  Exam reveals no gallop and no friction rub.   No murmur heard. Pulmonary/Chest: Effort normal and breath sounds normal. She has no wheezes. She has no rales.  Abdominal: Soft. Bowel sounds are normal. She exhibits no distension and no mass. There is no tenderness. There is no rebound and no guarding.  Musculoskeletal:       Right shoulder: Normal.       Left shoulder: Normal.       Cervical back: Normal.  Lymphadenopathy:    She has no cervical adenopathy.  Neurological: She is alert and oriented to person, place, and time. She has normal reflexes. No cranial nerve deficit. She exhibits normal muscle tone. Coordination normal.  Skin: Skin is warm and dry. No rash noted. She is not diaphoretic. No erythema. No pallor.  Psychiatric: She  has a normal mood and affect. Her behavior is normal. Judgment and thought content normal.  Nursing note and vitals reviewed.  Depression screen [facility] 2/9 01/17/2017 09/29/2015  Decreased Interest 0 0  Down, Depressed, Hopeless 0 0  PHQ - 2 Score 0 0   Fall Risk  01/17/2017  Falls in the past year? No   Functional Status Survey: Is the patient deaf or have difficulty hearing?: No Does the patient have difficulty seeing, even when wearing glasses/contacts?: No Does the patient have difficulty concentrating, remembering, or making decisions?: No Does the patient have difficulty walking or climbing stairs?: No Does the patient have difficulty dressing or bathing?: No Does the patient have difficulty doing errands alone such as visiting a doctor's office or shopping?: No     Assessment & Plan:   1. Encounter for Medicare annual wellness exam   2. Essential hypertension   3. Thyroid nodule   4. Renal insufficiency   5. History of bladder cancer, s/p cystectomy with ileal conduit   6. Pure hypercholesterolemia   7. Need for prophylactic vaccination and inoculation against influenza   8. Need for prophylactic vaccination against Streptococcus pneumoniae (pneumococcus)   9. Estrogen deficiency    -anticipatory guidance --- exercise, weight loss, 3 servings of dairy daily or 1200mg  calcium daily. -refer for bone density scan. -obtain labs for chronic disease management; increase Metoprolol ER to 50mg  daily; continue current dose of Losartan/HCTZ. -rx for ostomy supplies provided. -s/p Prevnar 13 and influenza vaccines -discussed advanced directives. -no evidence of hearing loss, depression; independent with ADLs; low fall risk.   Orders Placed This Encounter  Procedures  . DME Ostomy supplies    [employer] 8478 Premier Urostomy Pouch Flextend 1  1/2 inch (71mm) unit 5 Disp: 3 boxes for 2 month supply Refills: PRN one year Dx: history of bladder cancer s/p bladder resection Fax:  [phone]  . DG Bone Density    Standing Status:   Future    Standing Expiration Date:   03/17/2018    Order Specific Question:   Reason for Exam (SYMPTOM  OR DIAGNOSIS REQUIRED)    Answer:   estrogen deficiency    Order Specific Question:   Preferred imaging location?    Answer:   [facility]  . US THYROID    Standing Status:   Future    Standing Expiration Date:   03/17/2018    Order Specific Question:   Reason for Exam (SYMPTOM  OR DIAGNOSIS REQUIRED)    Answer:   thyromegaly L>R; history of multiple thyroid nodules    Order Specific Question:   Preferred imaging location?    Answer:   [facility]  . Flu Vaccine QUAD 36+ mos IM  . Pneumococcal conjugate vaccine 13-valent IM  . CBC with Differential/Platelet  . Comprehensive metabolic panel    Order Specific Question:   Has the patient fasted?    Answer:   Yes  . Lipid panel    Order Specific Question:   Has the patient fasted?    Answer:   Yes  . TSH  . T4, free  . EKG 12-Lead   Meds ordered this encounter  Medications  . losartan-hydrochlorothiazide (HYZAAR) 100-25 MG tablet    Sig: Take 1 tablet by mouth daily.    Dispense:  90 tablet    Refill:  1  . metoprolol succinate (TOPROL-XL) 50 MG 24 hr tablet    Sig: Take 1 tablet (50 mg total) by mouth daily.    Dispense:  90 tablet    Refill:  1    Return in about 3 months (around 04/16/2017) for recheck blood pressure.   Sara Aguirre, M.D. Primary Care at [facility] previously [facility] [address] [place], [place]  [address] [phone] phone [phone] fax

## 2017-01-17 NOTE — Patient Instructions (Addendum)
Please bring in copy of Living Will to Dr. Reginia Forts Please bring in blood pressure machine at your next visit.    IF you received an x-ray today, you will receive an invoice from [facility]. Please contact [facility] at [phone] with questions or concerns regarding your invoice.   IF you received labwork today, you will receive an invoice from [facility]. Please contact [facility] at [phone] with questions or concerns regarding your invoice.   Our billing staff will not be able to assist you with questions regarding bills from these companies.  You will be contacted with the lab results as soon as they are available. The fastest way to get your results is to activate your My Chart account. Instructions are located on the last page of this paperwork. If you have not heard from [place] regarding the results in 2 weeks, please contact this office.

## 2017-01-18 LAB — CBC WITH DIFFERENTIAL/PLATELET
BASOS ABS: 0 10*3/uL (ref 0.0–0.2)
Basos: 0 %
EOS (ABSOLUTE): 0.3 10*3/uL (ref 0.0–0.4)
Eos: 3 %
Hematocrit: 41 % (ref 34.0–46.6)
Hemoglobin: 13.6 g/dL (ref 11.1–15.9)
IMMATURE GRANS (ABS): 0 10*3/uL (ref 0.0–0.1)
Immature Granulocytes: 0 %
LYMPHS: 10 %
Lymphocytes Absolute: 0.9 10*3/uL (ref 0.7–3.1)
MCH: 28.9 pg (ref 26.6–33.0)
MCHC: 33.2 g/dL (ref 31.5–35.7)
MCV: 87 fL (ref 79–97)
MONOCYTES: 9 %
Monocytes Absolute: 0.8 10*3/uL (ref 0.1–0.9)
NEUTROS PCT: 78 %
Neutrophils Absolute: 7 10*3/uL (ref 1.4–7.0)
Platelets: 297 10*3/uL (ref 150–379)
RBC: 4.7 x10E6/uL (ref 3.77–5.28)
RDW: 13 % (ref 12.3–15.4)
WBC: 8.9 10*3/uL (ref 3.4–10.8)

## 2017-01-18 LAB — LIPID PANEL
Chol/HDL Ratio: 4.5 ratio units — ABNORMAL HIGH (ref 0.0–4.4)
Cholesterol, Total: 255 mg/dL — ABNORMAL HIGH (ref 100–199)
HDL: 57 mg/dL (ref >39)
LDL CALC: 164 mg/dL — AB (ref 0–99)
TRIGLYCERIDES: 172 mg/dL — AB (ref 0–149)
VLDL CHOLESTEROL CAL: 34 mg/dL (ref 5–40)

## 2017-01-18 LAB — COMPREHENSIVE METABOLIC PANEL
ALBUMIN: 4.2 g/dL (ref 3.5–4.8)
ALK PHOS: 93 IU/L (ref 39–117)
ALT: 11 IU/L (ref 0–32)
AST: 18 IU/L (ref 0–40)
Albumin/Globulin Ratio: 1.4 (ref 1.2–2.2)
BUN / CREAT RATIO: 11 — AB (ref 12–28)
BUN: 15 mg/dL (ref 8–27)
Bilirubin Total: 0.5 mg/dL (ref 0.0–1.2)
CO2: 28 mmol/L (ref 18–29)
CREATININE: 1.41 mg/dL — AB (ref 0.57–1.00)
Calcium: 9.5 mg/dL (ref 8.7–10.3)
Chloride: 97 mmol/L (ref 96–106)
GFR calc Af Amer: 41 mL/min/{1.73_m2} — ABNORMAL LOW (ref >59)
GFR calc non Af Amer: 36 mL/min/{1.73_m2} — ABNORMAL LOW (ref >59)
GLUCOSE: 98 mg/dL (ref 65–99)
Globulin, Total: 3.1 g/dL (ref 1.5–4.5)
Potassium: 4.7 mmol/L (ref 3.5–5.2)
Sodium: 143 mmol/L (ref 134–144)
Total Protein: 7.3 g/dL (ref 6.0–8.5)

## 2017-01-18 LAB — T4, FREE: FREE T4: 1.34 ng/dL (ref 0.82–1.77)

## 2017-01-18 LAB — TSH: TSH: 0.749 u[IU]/mL (ref 0.450–4.500)

## 2017-01-25 ENCOUNTER — Ambulatory Visit
Admission: RE | Admit: 2017-01-25 | Discharge: 2017-01-25 | Disposition: A | Discharge status: Home / Self Care | Payer: Medicare Other | Source: Ambulatory Visit | Attending: Family Medicine | Admitting: Family Medicine

## 2017-01-25 DIAGNOSIS — E041 Nontoxic single thyroid nodule: Secondary | ICD-10-CM

## 2017-02-01 ENCOUNTER — Other Ambulatory Visit: Payer: Self-pay

## 2017-02-01 ENCOUNTER — Encounter: Payer: Self-pay | Admitting: Family Medicine

## 2017-02-01 NOTE — Telephone Encounter (Signed)
PATIENT STATES SHE SENT DR. Tamala Julian AN E-MAIL STATING THAT SHE NEEDS HER UROSTOMY POUCHES BECAUSE NOW SHE IS COMPLETELY OUT. SHE SAID WHEN SHE SAW DR. Tamala Julian A FEW WEEKS AGO SHE SAID SHE WOULD SEND THEM IN. SHE WOULD LIKE IT DONE AS SOON AS POSSIBLE BECAUSE THE ONES SHE IS USING NOW DOES NOT HOLD UP WELL. THEY ARE [employer] UROSTOMY POUCHES  ITEM #[id number] (SHE GETS THEM FROM [employer]) THEIR PHONE # IS [phone]  THEIR FAX # IS [phone] PATIENT'S PHONE IS [phone] (CELL) [place]

## 2017-02-01 NOTE — Telephone Encounter (Signed)
PATIENT STATES SHE SENT DR. Tamala Julian AN E-MAIL STATING THAT SHE NEEDS HER UROSTOMY POUCHES BECAUSE NOW SHE IS COMPLETELY OUT. SHE SAID WHEN SHE SAW DR. Tamala Julian A FEW WEEKS AGO SHE SAID SHE WOULD SEND THEM IN. SHE WOULD LIKE IT DONE AS SOON AS POSSIBLE BECAUSE THE ONES SHE IS USING NOW DOES NOT HOLD UP WELL. THEY ARE [employer] UROSTOMY POUCHES  ITEM #[id number] (SHE GETS THEM FROM [employer]) THEIR PHONE # IS [phone]  THEIR FAX # IS [phone] PATIENT'S PHONE IS [phone] (CELL) [facility]   TAKEN FROM EMAIL

## 2017-02-05 ENCOUNTER — Telehealth: Payer: Self-pay | Admitting: Emergency Medicine

## 2017-02-05 DIAGNOSIS — Z936 Other artificial openings of urinary tract status: Secondary | ICD-10-CM | POA: Diagnosis not present

## 2017-02-05 MED ORDER — UNABLE TO FIND: 3 refills | Status: DC

## 2017-02-05 NOTE — Telephone Encounter (Signed)
Called in rx for [employer] urostomy pouches Item 8478 to [employer]. Mychart message to patient.  Please also fax rx to [employer].

## 2017-02-05 NOTE — Telephone Encounter (Signed)
Urostomy pouches supplies script faxed to [phone]

## 2017-04-03 DIAGNOSIS — H35033 Hypertensive retinopathy, bilateral: Secondary | ICD-10-CM | POA: Diagnosis not present

## 2017-04-03 DIAGNOSIS — H52223 Regular astigmatism, bilateral: Secondary | ICD-10-CM | POA: Diagnosis not present

## 2017-04-17 ENCOUNTER — Encounter: Payer: Self-pay | Admitting: Family Medicine

## 2017-04-17 ENCOUNTER — Ambulatory Visit (INDEPENDENT_AMBULATORY_CARE_PROVIDER_SITE_OTHER): Payer: PPO | Admitting: Family Medicine

## 2017-04-17 VITALS — BP 150/86 | HR 76 | Temp 98.3°F | Resp 16 | Ht 68.0 in | Wt 175.0 lb

## 2017-04-17 DIAGNOSIS — Z8551 Personal history of malignant neoplasm of bladder: Secondary | ICD-10-CM | POA: Diagnosis not present

## 2017-04-17 DIAGNOSIS — E7800 Pure hypercholesterolemia, unspecified: Secondary | ICD-10-CM

## 2017-04-17 DIAGNOSIS — E041 Nontoxic single thyroid nodule: Secondary | ICD-10-CM | POA: Diagnosis not present

## 2017-04-17 DIAGNOSIS — N289 Disorder of kidney and ureter, unspecified: Secondary | ICD-10-CM | POA: Diagnosis not present

## 2017-04-17 DIAGNOSIS — I10 Essential (primary) hypertension: Secondary | ICD-10-CM | POA: Diagnosis not present

## 2017-04-17 NOTE — Progress Notes (Signed)
Subjective:    Patient ID: Sara Aguirre, female    DOB: 11-11-1941, 76 y.o.   MRN: [id number]  04/17/2017  Follow-up (HTN)   HPI This 76 y.o. female presents for evaluation of three month follow-up of hypertension, hypercholesterolemia, chronic renal insufficiency.  Not checking blood pressure at home; does not trust machine.  Amlodipine too expensive.  Taking Losartan-HCTZ every morning; taking Metoprolol XL 50mg  daily.Not happy with cholesterol reading after last visit; avoiding sugar.  Cholesterol medication years ago; Lipitor for 12 years; kept reading about potential side effects; lately that women should not take Lipitor due to hyperglycemia.  No previous nephrology consultation.  s/p bladder cancer.  Increased Metoprolol ER from 25mg  to 50mg  at last visit.  Not taking calcium; only taking MVI.  Does not take ASA daily; history of mild PUD in past.  No headaches.  Thyroid nodules: chronic; s/p repeat imaging after last visit; thyroid US showed asymmetric thyromegaly with B nodules.  Enlargement in mid LEFT nodule which was previously bx.   Other nodules too small for bx.  LEFT nodule now 4.2 x 3 x 4cm and was previously 2.5 x 2.2. X 2 cm.     BP Readings from Last 3 Encounters:  04/17/17 (!) 150/86  01/17/17 (!) 198/98  09/29/15 (!) 178/100   Wt Readings from Last 3 Encounters:  04/17/17 175 lb (79.4 kg)  01/17/17 169 lb (76.7 kg)  09/29/15 161 lb (73 kg)   Immunization History  Administered Date(s) Administered  . Influenza,inj,Quad PF,36+ Mos 01/17/2017  . Influenza-Unspecified 10/07/2015  . Pneumococcal Conjugate-13 01/17/2017    Review of Systems  Constitutional: Negative for chills, diaphoresis, fatigue and fever.  Eyes: Negative for visual disturbance.  Respiratory: Negative for cough and shortness of breath.   Cardiovascular: Negative for chest pain, palpitations and leg swelling.  Gastrointestinal: Negative for abdominal pain, constipation, diarrhea, nausea and  vomiting.  Endocrine: Negative for cold intolerance, heat intolerance, polydipsia, polyphagia and polyuria.  Neurological: Negative for dizziness, tremors, seizures, syncope, facial asymmetry, speech difficulty, weakness, light-headedness, numbness and headaches.    Past Medical History:  Diagnosis Date  . Cancer [facility])    bladder, s/p cystectomy with ileal conduit  . History of bladder cancer   . History of tobacco use   . Hyperlipidemia   . Hypertension   . Incarcerated ventral hernia, s/p lap repair 12/05/2011  . Parastomal hernia of ileal conduit, s/p lap repair [id number] 12/04/2011  . Ulcer, gastric, acute or chronic    Past Surgical History:  Procedure Laterality Date  . BLADDER REMOVAL  10/1993   Dr. Risa Grill  . HERNIA REPAIR  12/05/11   parastomal  . PARASTOMAL HERNIA REPAIR  12/05/2011   Procedure: HERNIA REPAIR PARASTOMAL;  Surgeon: Adin Hector, MD;  Location: [facility];  Service: General;;  Laprascopic lysis of adhestons with reduction and repair with biological mesh for incarcerated parastomal and ventral long incisional hernia.  Marland Kitchen REVISION UROSTOMY CUTANEOUS    . VENTRAL HERNIA REPAIR  12/05/2011   Procedure: HERNIA REPAIR VENTRAL ADULT;  Surgeon: Adin Hector, MD;  Location: [facility];  Service: General;;   Allergies  Allergen Reactions  . Loperamide Hcl Rash    Social History   Social History  . Marital status: Widowed    Spouse name: N/A  . Number of children: N/A  . Years of education: N/A   Occupational History  . retired    Social History Main Topics  . Smoking status: Former Smoker  Quit date: 01/02/1993  . Smokeless tobacco: Never Used  . Alcohol use No  . Drug use: No  . Sexual activity: Not on file   Other Topics Concern  . 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  Gardening      ADLs: drives; independent with ADLs; has garden      Advanced Directives: YES: full code but no prolonged measures; HCPOA: Orvis Brill or other son Cory Munch; copy on chart 04/2017.      Family History  Problem Relation Age of Onset  . Heart disease Mother 38       CHF  . Alcohol abuse Father   . Stroke Father        Objective:    BP (!) 150/86   Pulse 76   Temp 98.3 F (36.8 C) (Oral)   Resp 16   Ht 5\' 8"  (1.727 m)   Wt 175 lb (79.4 kg)   SpO2 98%   BMI 26.61 kg/m  Physical Exam  Constitutional: She is oriented to person, place, and time. She appears well-developed and well-nourished. No distress.  HENT:  Head: Normocephalic and atraumatic.  Right Ear: External ear normal.  Left Ear: External ear normal.  Nose: Nose normal.  Mouth/Throat: Oropharynx is clear and moist.  Eyes: Conjunctivae and EOM are normal. Pupils are equal, round, and reactive to light.  Neck: Normal range of motion. Neck supple. Carotid bruit is not present. Thyromegaly present.  Cardiovascular: Normal rate, regular rhythm, normal heart sounds and intact distal pulses.  Exam reveals no gallop and no friction rub.   No murmur heard. Pulmonary/Chest: Effort normal and breath sounds normal. She has no wheezes. She has no rales.  Abdominal: Soft. Bowel sounds are normal. She exhibits no distension and no mass. There is no tenderness. There is no rebound and no guarding.  Lymphadenopathy:    She has no cervical adenopathy.  Neurological: She is alert and oriented to person, place, and time. No cranial nerve deficit.  Skin: Skin is warm and dry. No rash noted. She is not diaphoretic. No erythema. No pallor.  Psychiatric: She has a normal mood and affect. Her behavior is normal.   Depression screen  [facility] 2/9 01/17/2017 09/29/2015  Decreased Interest 0 0  Down, Depressed, Hopeless 0 0  PHQ - 2 Score 0 0   Fall Risk  01/17/2017  Falls in the past year? No        Assessment & Plan:   1. Essential hypertension   2. Thyroid nodule   3. Renal insufficiency   4. History of bladder cancer, s/p cystectomy with ileal conduit   5. Pure hypercholesterolemia    -enlarging nodule of LEFT thyroid; s/p bx in 2006; has doubled in size in 12 years.  -blood pressure uncontrolled yet improved; increase Metoprolol to 100mg  daily upon completion of current rx. -pt refuses statin therapy.  -plans to lose weight, exercise, and decrease sugar intake.    Orders Placed This Encounter  Procedures  . US  Thyroid Biopsy    Standing Status:   Future    Standing Expiration Date:   07/10/2018    Order Specific Question:   Reason for Exam (SYMPTOM  OR DIAGNOSIS REQUIRED)    Answer:   LEFT THYROID NODULE 4 CM    Order Specific Question:   Preferred imaging location?    Answer:   [facility]  . Comprehensive metabolic panel   Meds ordered this encounter  Medications  . losartan-hydrochlorothiazide (HYZAAR) 100-25 MG tablet    Sig: Take 1 tablet by mouth daily.    Dispense:  90 tablet    Refill:  1  . metoprolol succinate (TOPROL-XL) 50 MG 24 hr tablet    Sig: Take 1 tablet (50 mg total) by mouth daily.    Dispense:  90 tablet    Refill:  1    Return in about 6 months (around 10/18/2017) for recheck high blood pressure, high blood pressure.   Naydelin Ziegler Elayne Guerin, M.D. Primary Care at [facility] previously [facility] [address] [place], [place]  [address] [phone] phone [phone] fax

## 2017-04-17 NOTE — Patient Instructions (Addendum)
   IF you received an x-ray today, you will receive an invoice from [facility]. Please contact [facility] at [phone] with questions or concerns regarding your invoice.   IF you received labwork today, you will receive an invoice from [facility]. Please contact [facility] at [phone] with questions or concerns regarding your invoice.   Our billing staff will not be able to assist you with questions regarding bills from these companies.  You will be contacted with the lab results as soon as they are available. The fastest way to get your results is to activate your My Chart account. Instructions are located on the last page of this paperwork. If you have not heard from us regarding the results in 2 weeks, please contact this office.      Fat and Cholesterol Restricted Diet Getting too much fat and cholesterol in your diet may cause health problems. Following this diet helps keep your fat and cholesterol at normal levels. This can keep you from getting sick. What types of fat should I choose?  Choose monosaturated and polyunsaturated fats. These are found in foods such as olive oil, canola oil, flaxseeds, walnuts, almonds, and seeds.  Eat more omega-3 fats. Good choices include salmon, mackerel, sardines, tuna, flaxseed oil, and ground flaxseeds.  Limit saturated fats. These are in animal products such as meats, butter, and cream. They can also be in plant products such as palm oil, palm kernel oil, and coconut oil.  Avoid foods with partially hydrogenated oils in them. These contain trans fats. Examples of foods that have trans fats are stick margarine, some tub margarines, cookies, crackers, and other baked goods. What general guidelines do I need to follow?  Check food labels. Look for the words "trans fat" and "saturated fat."  When preparing a meal:  Fill half of your plate with vegetables and green salads.  Fill one fourth of your plate with whole  grains. Look for the word "whole" as the first word in the ingredient list.  Fill one fourth of your plate with lean protein foods.  Eat more foods that have fiber, like apples, carrots, beans, peas, and barley.  Eat more home-cooked foods. Eat less at restaurants and buffets.  Limit or avoid alcohol.  Limit foods high in starch and sugar.  Limit fried foods.  Cook foods without frying them. Baking, boiling, grilling, and broiling are all great options.  Lose weight if you are overweight. Losing even a small amount of weight can help your overall health. It can also help prevent diseases such as diabetes and heart disease. What foods can I eat? Grains  Whole grains, such as whole wheat or whole grain breads, crackers, cereals, and pasta. Unsweetened oatmeal, bulgur, barley, quinoa, or brown rice. Corn or whole wheat flour tortillas. Vegetables  Fresh or frozen vegetables (raw, steamed, roasted, or grilled). Green salads. Fruits  All fresh, canned (in natural juice), or frozen fruits. Meat and Other Protein Products  Ground beef (85% or leaner), grass-fed beef, or beef trimmed of fat. Skinless chicken or turkey. Ground chicken or turkey. Pork trimmed of fat. All fish and seafood. Eggs. Dried beans, peas, or lentils. Unsalted nuts or seeds. Unsalted canned or dry beans. Dairy  Low-fat dairy products, such as skim or 1% milk, 2% or reduced-fat cheeses, low-fat ricotta or cottage cheese, or plain low-fat yogurt. Fats and Oils  Tub margarines without trans fats. Light or reduced-fat mayonnaise and salad dressings. Avocado. Olive, canola, sesame, or safflower oils. Natural peanut   or almond butter (choose ones without added sugar and oil). The items listed above may not be a complete list of recommended foods or beverages. Contact your dietitian for more options.  What foods are not recommended? Grains  White bread. White pasta. White rice. Cornbread. Bagels, pastries, and croissants.  Crackers that contain trans fat. Vegetables  White potatoes. Corn. Creamed or fried vegetables. Vegetables in a cheese sauce. Fruits  Dried fruits. Canned fruit in light or heavy syrup. Fruit juice. Meat and Other Protein Products  Fatty cuts of meat. Ribs, chicken wings, bacon, sausage, bologna, salami, chitterlings, fatback, hot dogs, bratwurst, and packaged luncheon meats. Liver and organ meats. Dairy  Whole or 2% milk, cream, half-and-half, and cream cheese. Whole milk cheeses. Whole-fat or sweetened yogurt. Full-fat cheeses. Nondairy creamers and whipped toppings. Processed cheese, cheese spreads, or cheese curds. Sweets and Desserts  Corn syrup, sugars, honey, and molasses. Candy. Jam and jelly. Syrup. Sweetened cereals. Cookies, pies, cakes, donuts, muffins, and ice cream. Fats and Oils  Butter, stick margarine, lard, shortening, ghee, or bacon fat. Coconut, palm kernel, or palm oils. Beverages  Alcohol. Sweetened drinks (such as sodas, lemonade, and fruit drinks or punches). The items listed above may not be a complete list of foods and beverages to avoid. Contact your dietitian for more information.  This information is not intended to replace advice given to you by your health care provider. Make sure you discuss any questions you have with your health care provider. Document Released: 06/04/2012 Document Revised: 08/10/2016 Document Reviewed: 03/05/2014 Elsevier Interactive Patient Education  2017 Elsevier Inc.  

## 2017-04-18 LAB — COMPREHENSIVE METABOLIC PANEL
A/G RATIO: 1.2 (ref 1.2–2.2)
ALT: 14 IU/L (ref 0–32)
AST: 21 IU/L (ref 0–40)
Albumin: 4.1 g/dL (ref 3.5–4.8)
Alkaline Phosphatase: 76 IU/L (ref 39–117)
BUN/Creatinine Ratio: 23 (ref 12–28)
BUN: 37 mg/dL — ABNORMAL HIGH (ref 8–27)
Bilirubin Total: 0.2 mg/dL (ref 0.0–1.2)
CALCIUM: 9.5 mg/dL (ref 8.7–10.3)
CHLORIDE: 95 mmol/L — AB (ref 96–106)
CO2: 26 mmol/L (ref 18–29)
Creatinine, Ser: 1.61 mg/dL — ABNORMAL HIGH (ref 0.57–1.00)
GFR, EST AFRICAN AMERICAN: 35 mL/min/{1.73_m2} — AB (ref >59)
GFR, EST NON AFRICAN AMERICAN: 30 mL/min/{1.73_m2} — AB (ref >59)
Globulin, Total: 3.4 g/dL (ref 1.5–4.5)
Glucose: 99 mg/dL (ref 65–99)
Potassium: 4.4 mmol/L (ref 3.5–5.2)
Sodium: 139 mmol/L (ref 134–144)
TOTAL PROTEIN: 7.5 g/dL (ref 6.0–8.5)

## 2017-04-19 DIAGNOSIS — Z936 Other artificial openings of urinary tract status: Secondary | ICD-10-CM | POA: Diagnosis not present

## 2017-05-10 MED ORDER — LOSARTAN POTASSIUM-HCTZ 100-25 MG PO TABS: 1.0000 | ORAL_TABLET | Freq: Every day | ORAL | 1 refills | Status: DC

## 2017-05-10 MED ORDER — METOPROLOL SUCCINATE ER 50 MG PO TB24: 50.0000 mg | ORAL_TABLET | Freq: Every day | ORAL | 1 refills | Status: DC

## 2017-05-24 ENCOUNTER — Other Ambulatory Visit (HOSPITAL_COMMUNITY)
Admission: RE | Admit: 2017-05-24 | Discharge: 2017-05-24 | Disposition: A | Discharge status: Home / Self Care | Payer: PPO | Source: Ambulatory Visit | Attending: Radiology | Admitting: Radiology

## 2017-05-24 ENCOUNTER — Ambulatory Visit
Admission: RE | Admit: 2017-05-24 | Discharge: 2017-05-24 | Disposition: A | Discharge status: Home / Self Care | Payer: PPO | Source: Ambulatory Visit | Attending: Family Medicine | Admitting: Family Medicine

## 2017-05-24 DIAGNOSIS — E041 Nontoxic single thyroid nodule: Secondary | ICD-10-CM | POA: Insufficient documentation

## 2017-06-12 DIAGNOSIS — H25012 Cortical age-related cataract, left eye: Secondary | ICD-10-CM | POA: Diagnosis not present

## 2017-06-12 DIAGNOSIS — H2512 Age-related nuclear cataract, left eye: Secondary | ICD-10-CM | POA: Diagnosis not present

## 2017-06-12 DIAGNOSIS — Z961 Presence of intraocular lens: Secondary | ICD-10-CM | POA: Diagnosis not present

## 2017-06-12 DIAGNOSIS — H25042 Posterior subcapsular polar age-related cataract, left eye: Secondary | ICD-10-CM | POA: Diagnosis not present

## 2017-07-13 DIAGNOSIS — H2512 Age-related nuclear cataract, left eye: Secondary | ICD-10-CM | POA: Diagnosis not present

## 2017-07-13 DIAGNOSIS — H25812 Combined forms of age-related cataract, left eye: Secondary | ICD-10-CM | POA: Diagnosis not present

## 2017-10-23 ENCOUNTER — Ambulatory Visit: Payer: PPO | Admitting: Family Medicine

## 2018-01-02 ENCOUNTER — Other Ambulatory Visit: Payer: Self-pay | Admitting: Family Medicine

## 2018-01-02 DIAGNOSIS — I10 Essential (primary) hypertension: Secondary | ICD-10-CM

## 2018-01-23 ENCOUNTER — Ambulatory Visit: Payer: PPO | Admitting: Family Medicine

## 2018-01-23 ENCOUNTER — Ambulatory Visit: Payer: PPO

## 2018-01-23 ENCOUNTER — Telehealth: Payer: Self-pay | Admitting: Family Medicine

## 2018-01-23 NOTE — Telephone Encounter (Signed)
Tried to call and remind pt of their appts tomorrow. One AWV and then and appt with Dr. Nolon Rod  Phone rang but did not give me a chance to leave a message.   If they call back, please advise them of their apt time, building and 15 minutes early as well as the 10 minute late policy.

## 2018-01-23 NOTE — Progress Notes (Signed)
Chief Complaint  Patient presents with  . Medication Refill    metoprolol and losartan-hctz    HPI  Patient reports that she has been checking her bp 4 different times a day for 30 days She would like to get off the medications She stopped taking her bp meds for a month She checked it 4 times a day She noticed that if she took her bp or skipped it her bp was the same Her bp ranged between 299-242 systolic and 68-34H She denies chest pain, shortness of breath and lower extremity edema She denies any dizziness or palpitations  Past Medical History:  Diagnosis Date  . Cancer [facility])    bladder, s/p cystectomy with ileal conduit  . History of bladder cancer   . History of tobacco use   . Hyperlipidemia   . Hypertension   . Incarcerated ventral hernia, s/p lap repair 12/05/2011  . Parastomal hernia of ileal conduit, s/p lap repair [id number] 12/04/2011  . Ulcer, gastric, acute or chronic     Current Outpatient Medications  Medication Sig Dispense Refill  . hydrochlorothiazide (HYDRODIURIL) 25 MG tablet Take 1 tablet (25 mg total) by mouth daily. 90 tablet 1  . Multiple Vitamin (MULTIVITAMIN) tablet Take 1 tablet by mouth daily.    Marland Kitchen UNABLE TO FIND HOLISTER UROSTOMY POUCHES ITEM 9892 (Patient not taking: Reported on 01/24/2018) 100 Device 3   No current facility-administered medications for this visit.     Allergies:  Allergies  Allergen Reactions  . Loperamide Hcl Rash    Past Surgical History:  Procedure Laterality Date  . BLADDER REMOVAL  10/1993   Dr. Risa Grill  . HERNIA REPAIR  12/05/11   parastomal  . PARASTOMAL HERNIA REPAIR  12/05/2011   Procedure: HERNIA REPAIR PARASTOMAL;  Surgeon: Adin Hector, MD;  Location: [facility];  Service: General;;  Laprascopic lysis of adhestons with reduction and repair with biological mesh for incarcerated parastomal and ventral long incisional hernia.  Marland Kitchen REVISION UROSTOMY CUTANEOUS    . VENTRAL HERNIA REPAIR  12/05/2011   Procedure:  HERNIA REPAIR VENTRAL ADULT;  Surgeon: Adin Hector, MD;  Location: [facility];  Service: General;;    Social History   Socioeconomic History  . Marital status: Widowed    Spouse name: None  . Number of children: 2  . Years of education: None  . Highest education level: Some college, no degree  Social Needs  . Financial resource strain: Not hard at all  . Food insecurity - worry: Never true  . Food insecurity - inability: Never true  . Transportation needs - medical: No  . Transportation needs - non-medical: No  Occupational History  . Occupation: retired  Tobacco Use  . Smoking status: Former Smoker    Last attempt to quit: 01/02/1993    Years since quitting: 25.0  . Smokeless tobacco: Never Used  Substance and Sexual Activity  . Alcohol use: No  . Drug use: No  . Sexual activity: None  Other Topics Concern  . 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  Gardening      ADLs: drives; independent with ADLs; has garden      Advanced Directives: YES: full code but no prolonged measures; HCPOA: Orvis Brill or other son Cory Munch; copy on chart 04/2017.    Family History  Problem Relation Age of Onset  . Heart disease Mother 24       CHF  . Alcohol abuse Father   . Stroke Father      ROS Review of Systems See HPI Constitution: No fevers or chills No malaise No diaphoresis Skin: No rash or itching Eyes: no blurry vision, no double vision GU: no dysuria or hematuria Neuro: no dizziness or headaches  all others reviewed and negative   Objective: Vitals:   01/24/18 1007  BP: (!) 150/88  Pulse: 73  Resp: 16  Temp:  98.6 F (37 C)  TempSrc: Oral  SpO2: 95%  Weight: 173 lb (78.5 kg)  Height: 5\' 8"  (1.727 m)    Physical Exam  Constitutional: She is oriented to person, place, and time. She appears well-developed and well-nourished.  HENT:  Head: Normocephalic and atraumatic.  Eyes: Conjunctivae and EOM are normal.  Cardiovascular: Normal rate, regular rhythm and normal heart sounds.  No murmur heard. Pulmonary/Chest: Effort normal and breath sounds normal. No stridor. No respiratory distress. She has no wheezes.  Neurological: She is alert and oriented to person, place, and time.  Skin: Skin is warm. Capillary refill takes less than 2 seconds.  Psychiatric: She has a normal mood and affect. Her behavior is normal. Judgment and thought content normal.    Assessment and Plan Aviva was seen today for medication refill.  Diagnoses and all orders for this visit:  Uncontrolled hypertension-  Discussed JNC 8 Hypertension guidelines with patient Discussed with the patient her goals and her goals are to increase her exercise and watch her food and be off all meds After discussing the JNC 8 recommendations patient has agreed to HCTZ 25mg  only and will continue lifestyle modification Goal bp since pt is over age 68, nondiabetic and not African American establishing a goal of BP <150/90 Discussed exercise for her age  79 months follow up for hypertension  -     Comprehensive metabolic panel -     Lipid panel  -     hydrochlorothiazide (HYDRODIURIL) 25 MG tablet; Take 1 tablet (25 mg total) by mouth daily.  Encounter for medication monitoring -     Comprehensive metabolic panel -     Lipid panel  Dyslipidemia- pt willing to check lipids but does not plan on taking any meds even if the levels are high -     Comprehensive metabolic panel -     Lipid panel    A total of 30 minutes were spent face-to-face with the patient during this encounter and over half of that time was spent on counseling and  coordination of care.   [place]

## 2018-01-24 ENCOUNTER — Encounter: Payer: Self-pay | Admitting: Family Medicine

## 2018-01-24 ENCOUNTER — Ambulatory Visit (INDEPENDENT_AMBULATORY_CARE_PROVIDER_SITE_OTHER): Payer: PPO | Admitting: Family Medicine

## 2018-01-24 ENCOUNTER — Ambulatory Visit (INDEPENDENT_AMBULATORY_CARE_PROVIDER_SITE_OTHER): Payer: PPO

## 2018-01-24 VITALS — BP 150/88 | HR 73 | Temp 98.6°F | Resp 16 | Ht 68.0 in | Wt 173.0 lb

## 2018-01-24 VITALS — BP 150/88 | HR 73 | Ht 68.0 in | Wt 173.0 lb

## 2018-01-24 DIAGNOSIS — Z5181 Encounter for therapeutic drug level monitoring: Secondary | ICD-10-CM | POA: Diagnosis not present

## 2018-01-24 DIAGNOSIS — E785 Hyperlipidemia, unspecified: Secondary | ICD-10-CM | POA: Diagnosis not present

## 2018-01-24 DIAGNOSIS — Z0000 Encounter for general adult medical examination without abnormal findings: Secondary | ICD-10-CM

## 2018-01-24 DIAGNOSIS — I10 Essential (primary) hypertension: Secondary | ICD-10-CM

## 2018-01-24 MED ORDER — HYDROCHLOROTHIAZIDE 25 MG PO TABS: 25.0000 mg | ORAL_TABLET | Freq: Every day | ORAL | 1 refills | Status: DC

## 2018-01-24 NOTE — Patient Instructions (Addendum)
Return to clinic in 3 months if blood pressure is at goal of less than 150/90 If blood pressure is staying above the 150s please come back in one month    IF you received an x-ray today, you will receive an invoice from [facility]. Please contact [facility] at [phone] with questions or concerns regarding your invoice.   IF you received labwork today, you will receive an invoice from [facility]. Please contact [facility] at [phone] with questions or concerns regarding your invoice.   Our billing staff will not be able to assist you with questions regarding bills from these companies.  You will be contacted with the lab results as soon as they are available. The fastest way to get your results is to activate your My Chart account. Instructions are located on the last page of this paperwork. If you have not heard from [place] regarding the results in 2 weeks, please contact this office.

## 2018-01-24 NOTE — Patient Instructions (Addendum)
Sara Aguirre , Thank you for taking time to come for your Medicare Wellness Visit. I appreciate your ongoing commitment to your health goals. Please review the following plan we discussed and let me know if I can assist you in the future.   Screening recommendations/referrals: Colonoscopy: up to date Mammogram: stops at age 79 Bone Density: declined  Recommended yearly ophthalmology/optometry visit for glaucoma screening and checkup Recommended yearly dental visit for hygiene and checkup.  Vaccinations: Influenza vaccine: up to date Pneumococcal vaccine: Declined  Tdap vaccine: Declined due to insurance Shingles vaccine: Declined     Advanced directives: on file  Conditions/risks identified: Try to stop eating meat and maybe become a vegan.   Next appointment: today @ 10 am with Dr. Nolon Rod, next AWV in 1 year    Preventive Care 44 Years and Older, Female Preventive care refers to lifestyle choices and visits with your health care provider that can promote health and wellness. What does preventive care include?  A yearly physical exam. This is also called an annual well check.  Dental exams once or twice a year.  Routine eye exams. Ask your health care provider how often you should have your eyes checked.  Personal lifestyle choices, including:  Daily care of your teeth and gums.  Regular physical activity.  Eating a healthy diet.  Avoiding tobacco and drug use.  Limiting alcohol use.  Practicing safe sex.  Taking low-dose aspirin every day.  Taking vitamin and mineral supplements as recommended by your health care provider. What happens during an annual well check? The services and screenings done by your health care provider during your annual well check will depend on your age, overall health, lifestyle risk factors, and family history of disease. Counseling  Your health care provider may ask you questions about your:  Alcohol use.  Tobacco use.  Drug  use.  Emotional well-being.  Home and relationship well-being.  Sexual activity.  Eating habits.  History of falls.  Memory and ability to understand (cognition).  Work and work Statistician.  Reproductive health. Screening  You may have the following tests or measurements:  Height, weight, and BMI.  Blood pressure.  Lipid and cholesterol levels. These may be checked every 5 years, or more frequently if you are over 51 years old.  Skin check.  Lung cancer screening. You may have this screening every year starting at age 67 if you have a 30-pack-year history of smoking and currently smoke or have quit within the past 15 years.  Fecal occult blood test (FOBT) of the stool. You may have this test every year starting at age 81.  Flexible sigmoidoscopy or colonoscopy. You may have a sigmoidoscopy every 5 years or a colonoscopy every 10 years starting at age 40.  Hepatitis C blood test.  Hepatitis B blood test.  Sexually transmitted disease (STD) testing.  Diabetes screening. This is done by checking your blood sugar (glucose) after you have not eaten for a while (fasting). You may have this done every 1-3 years.  Bone density scan. This is done to screen for osteoporosis. You may have this done starting at age 41.  Mammogram. This may be done every 1-2 years. Talk to your health care provider about how often you should have regular mammograms. Talk with your health care provider about your test results, treatment options, and if necessary, the need for more tests. Vaccines  Your health care provider may recommend certain vaccines, such as:  Influenza vaccine. This is recommended every  year.  Tetanus, diphtheria, and acellular pertussis (Tdap, Td) vaccine. You may need a Td booster every 10 years.  Zoster vaccine. You may need this after age 78.  Pneumococcal 13-valent conjugate (PCV13) vaccine. One dose is recommended after age 28.  Pneumococcal polysaccharide  (PPSV23) vaccine. One dose is recommended after age 43. Talk to your health care provider about which screenings and vaccines you need and how often you need them. This information is not intended to replace advice given to you by your health care provider. Make sure you discuss any questions you have with your health care provider. Document Released: 12/31/2015 Document Revised: 08/23/2016 Document Reviewed: 10/05/2015 Elsevier Interactive Patient Education  2017 [employer] Prevention in the Home Falls can cause injuries. They can happen to people of all ages. There are many things you can do to make your home safe and to help prevent falls. What can I do on the outside of my home?  Regularly fix the edges of walkways and driveways and fix any cracks.  Remove anything that might make you trip as you walk through a door, such as a raised step or threshold.  Trim any bushes or trees on the path to your home.  Use bright outdoor lighting.  Clear any walking paths of anything that might make someone trip, such as rocks or tools.  Regularly check to see if handrails are loose or broken. Make sure that both sides of any steps have handrails.  Any raised decks and porches should have guardrails on the edges.  Have any leaves, snow, or ice cleared regularly.  Use sand or salt on walking paths during winter.  Clean up any spills in your garage right away. This includes oil or grease spills. What can I do in the bathroom?  Use night lights.  Install grab bars by the toilet and in the tub and shower. Do not use towel bars as grab bars.  Use non-skid mats or decals in the tub or shower.  If you need to sit down in the shower, use a plastic, non-slip stool.  Keep the floor dry. Clean up any water that spills on the floor as soon as it happens.  Remove soap buildup in the tub or shower regularly.  Attach bath mats securely with double-sided non-slip rug tape.  Do not have  throw rugs and other things on the floor that can make you trip. What can I do in the bedroom?  Use night lights.  Make sure that you have a light by your bed that is easy to reach.  Do not use any sheets or blankets that are too big for your bed. They should not hang down onto the floor.  Have a firm chair that has side arms. You can use this for support while you get dressed.  Do not have throw rugs and other things on the floor that can make you trip. What can I do in the kitchen?  Clean up any spills right away.  Avoid walking on wet floors.  Keep items that you use a lot in easy-to-reach places.  If you need to reach something above you, use a strong step stool that has a grab bar.  Keep electrical cords out of the way.  Do not use floor polish or wax that makes floors slippery. If you must use wax, use non-skid floor wax.  Do not have throw rugs and other things on the floor that can make you trip. What can  I do with my stairs?  Do not leave any items on the stairs.  Make sure that there are handrails on both sides of the stairs and use them. Fix handrails that are broken or loose. Make sure that handrails are as long as the stairways.  Check any carpeting to make sure that it is firmly attached to the stairs. Fix any carpet that is loose or worn.  Avoid having throw rugs at the top or bottom of the stairs. If you do have throw rugs, attach them to the floor with carpet tape.  Make sure that you have a light switch at the top of the stairs and the bottom of the stairs. If you do not have them, ask someone to add them for you. What else can I do to help prevent falls?  Wear shoes that:  Do not have high heels.  Have rubber bottoms.  Are comfortable and fit you well.  Are closed at the toe. Do not wear sandals.  If you use a stepladder:  Make sure that it is fully opened. Do not climb a closed stepladder.  Make sure that both sides of the stepladder are  locked into place.  Ask someone to hold it for you, if possible.  Clearly mark and make sure that you can see:  Any grab bars or handrails.  First and last steps.  Where the edge of each step is.  Use tools that help you move around (mobility aids) if they are needed. These include:  Canes.  Walkers.  Scooters.  Crutches.  Turn on the lights when you go into a dark area. Replace any light bulbs as soon as they burn out.  Set up your furniture so you have a clear path. Avoid moving your furniture around.  If any of your floors are uneven, fix them.  If there are any pets around you, be aware of where they are.  Review your medicines with your doctor. Some medicines can make you feel dizzy. This can increase your chance of falling. Ask your doctor what other things that you can do to help prevent falls. This information is not intended to replace advice given to you by your health care provider. Make sure you discuss any questions you have with your health care provider. Document Released: 09/30/2009 Document Revised: 05/11/2016 Document Reviewed: 01/08/2015 Elsevier Interactive Patient Education  2017 [employer].

## 2018-01-24 NOTE — Progress Notes (Signed)
Subjective:   Sara Aguirre is a 77 y.o. female who presents for Medicare Annual (Subsequent) preventive examination.  Review of Systems:  N/A Cardiac Risk Factors include: advanced age (>51men, >50 women);dyslipidemia;hypertension     Objective:     Vitals: BP (!) 150/88   Pulse 73   Ht 5\' 8"  (1.727 m)   Wt 173 lb (78.5 kg)   SpO2 95%   BMI 26.30 kg/m   Body mass index is 26.3 kg/m.  Advanced Directives 01/24/2018 12/05/2011 12/04/2011  Does Patient Have a Medical Advance Directive? Yes Patient does not have advance directive Patient does not have advance directive  Type of Advance Directive [place];Living will - -  Copy of [place] in Chart? Yes - -  Pre-existing out of facility DNR order (yellow form or pink MOST form) - No No    Tobacco Social History   Tobacco Use  Smoking Status Former Smoker  . Last attempt to quit: 01/02/1993  . Years since quitting: 25.0  Smokeless Tobacco Never Used     Counseling given: Not Answered   Clinical Intake:  Pre-visit preparation completed: Yes  Pain : No/denies pain Pain Score: 0-No pain     Nutritional Status: BMI 25 -29 Overweight Nutritional Risks: None Diabetes: No  How often do you need to have someone help you when you read instructions, pamphlets, or other written materials from your doctor or pharmacy?: 1 - Never What is the last grade level you completed in school?: some college   Interpreter Needed?: No  Information entered by :: Sara Grime, LPN  Past Medical History:  Diagnosis Date  . Cancer [facility])    bladder, s/p cystectomy with ileal conduit  . History of bladder cancer   . History of tobacco use   . Hyperlipidemia   . Hypertension   . Incarcerated ventral hernia, s/p lap repair 12/05/2011  . Parastomal hernia of ileal conduit, s/p lap repair [id number] 12/04/2011  . Ulcer, gastric, acute or chronic    Past Surgical History:  Procedure Laterality  Date  . BLADDER REMOVAL  10/1993   Dr. Risa Aguirre  . HERNIA REPAIR  12/05/11   parastomal  . PARASTOMAL HERNIA REPAIR  12/05/2011   Procedure: HERNIA REPAIR PARASTOMAL;  Surgeon: Sara Hector, MD;  Location: [facility];  Service: General;;  Laprascopic lysis of adhestons with reduction and repair with biological mesh for incarcerated parastomal and ventral long incisional hernia.  Marland Kitchen REVISION UROSTOMY CUTANEOUS    . VENTRAL HERNIA REPAIR  12/05/2011   Procedure: HERNIA REPAIR VENTRAL ADULT;  Surgeon: Sara Hector, MD;  Location: [facility];  Service: General;;   Family History  Problem Relation Age of Onset  . Heart disease Mother 66       CHF  . Alcohol abuse Father   . Stroke Father    Social History   Socioeconomic History  . Marital status: Widowed    Spouse name: None  . Number of children: 2  . Years of education: None  . Highest education level: Some college, no degree  Social Needs  . Financial resource strain: Not hard at all  . Food insecurity - worry: Never true  . Food insecurity - inability: Never true  . Transportation needs - medical: No  . Transportation needs - non-medical: No  Occupational History  . Occupation: retired  Tobacco Use  . Smoking status: Former Smoker    Last attempt to quit: 01/02/1993  Years since quitting: 25.0  . Smokeless tobacco: Never Used  Substance and Sexual Activity  . Alcohol use: No  . Drug use: No  . Sexual activity: None  Other Topics Concern  . 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  Gardening      ADLs: drives; independent with ADLs; has garden      Advanced Directives: YES: full code  but no prolonged measures; HCPOA: Sara Aguirre or other son Sara Aguirre; copy on chart 04/2017.    Outpatient Encounter Medications as of 01/24/2018  Medication Sig  . losartan-hydrochlorothiazide (HYZAAR) 100-25 MG tablet TAKE 1 TABLET BY MOUTH EVERY DAY  . metoprolol succinate (TOPROL-XL) 50 MG 24 hr tablet TAKE 1 TABLET BY MOUTH EVERY DAY  . Multiple Vitamin (MULTIVITAMIN) tablet Take 1 tablet by mouth daily.  Marland Kitchen UNABLE TO FIND HOLISTER UROSTOMY POUCHES ITEM 0109 (Patient not taking: Reported on 01/24/2018)   No facility-administered encounter medications on file as of 01/24/2018.     Activities of Daily Living In your present state of health, do you have any difficulty performing the following activities: 01/24/2018 04/17/2017  Hearing? N N  Vision? N Y  Difficulty concentrating or making decisions? N N  Walking or climbing stairs? N N  Dressing or bathing? N N  Doing errands, shopping? N N  Preparing Food and eating ? N -  Using the Toilet? N -  In the past six months, have you accidently leaked urine? N -  Do you have problems with loss of bowel control? Y -  Comment When patient has diarrhea, she may have a little leakage -  Managing your Medications? N -  Managing your Finances? N -  Housekeeping or managing your Housekeeping? N -  Some recent data might be hidden    Patient Care Team: Wardell Honour, MD as PCP - General (Family Medicine)    Assessment:   This is a routine wellness examination for Sara Aguirre.  Exercise Activities and Dietary recommendations Current Exercise Habits: The patient does not participate in regular exercise at present(stays active in the garden ), Exercise limited by: None identified  Goals    . DIET - REDUCE PORTION SIZE     Patient states that she wants to stop eating meat and maybe become a vegan.        Fall Risk Fall Risk  01/24/2018 01/24/2018 01/17/2017  Falls in the past year? Yes Yes No  Number falls in past yr: 1 1 -  Injury with  Fall? No Yes -  Comment scratched up knee, tripped and fell - -  Risk for fall due to : - Other (Comment) -  Risk for fall due to: Comment - tripped and fell  -  Follow up - Falls prevention discussed -   Is the patient's home free of loose throw rugs in walkways, pet beds, electrical cords, etc?   yes      Grab bars in the bathroom? no      Handrails on the stairs?   yes  Adequate lighting?   yes  Timed Get Up and Go performed: yes, completed within 30 seconds   Depression Screen PHQ 2/9 Scores 01/24/2018 01/24/2018 01/17/2017 09/29/2015  PHQ - 2 Score 0 0 0 0     Cognitive Function     6CIT Screen 01/24/2018  What Year? 0 points  What month? 0 points  What time? 0 points  Count back from 20 0 points  Months in reverse 0 points  Repeat phrase 2 points  Total Score 2    Immunization History  Administered Date(s) Administered  . Influenza, High Dose Seasonal PF 10/01/2017  . Influenza,inj,Quad PF,6+ Mos 01/17/2017  . Influenza-Unspecified 10/07/2015  . Pneumococcal Conjugate-13 01/17/2017    Qualifies for Shingles Vaccine? Patient declined Shingrix vaccine   Screening Tests Health Maintenance  Topic Date Due  . PNA vac Low Risk Adult (2 of 2 - PPSV23) 01/17/2018  . DEXA SCAN  01/24/2019 (Originally 01/09/2004)  . TETANUS/TDAP  01/24/2019 (Originally 01/08/1960)  . INFLUENZA VACCINE  Completed    Cancer Screenings: Lung: Low Dose CT Chest recommended if Age 76-80 years, 30 pack-year currently smoking OR have quit w/in 15years. Patient does not qualify. Breast:  Up to date on Mammogram? No, stops at age 36  Up to date of Bone Density/Dexa? No, Patient declined bone density  Colorectal: completed 12/02/2010  Additional Screenings:  Hepatitis B/HIV/Syphillis: not indicated  Hepatitis C Screening: not indicated     Patient declined Pneumovax 23, Shingrix and bone density test.  Plan:   I have personally reviewed and noted the following in the patient's chart:    . Medical and social history . Use of alcohol, tobacco or illicit drugs  . Current medications and supplements . Functional ability and status . Nutritional status . Physical activity . Advanced directives . List of other physicians . Hospitalizations, surgeries, and ER visits in previous 12 months . Vitals . Screenings to include cognitive, depression, and falls . Referrals and appointments  In addition, I have reviewed and discussed with patient certain preventive protocols, quality metrics, and best practice recommendations. A written personalized care plan for preventive services as well as general preventive health recommendations were provided to patient.   1. Encounter for Medicare annual wellness exam    Sara Grime, LPN  03/22/8098

## 2018-01-25 LAB — COMPREHENSIVE METABOLIC PANEL
A/G RATIO: 1.3 (ref 1.2–2.2)
ALT: 12 IU/L (ref 0–32)
AST: 22 IU/L (ref 0–40)
Albumin: 4.2 g/dL (ref 3.5–4.8)
Alkaline Phosphatase: 82 IU/L (ref 39–117)
BUN/Creatinine Ratio: 16 (ref 12–28)
BUN: 26 mg/dL (ref 8–27)
Bilirubin Total: 0.5 mg/dL (ref 0.0–1.2)
CALCIUM: 9.5 mg/dL (ref 8.7–10.3)
CO2: 28 mmol/L (ref 20–29)
CREATININE: 1.59 mg/dL — AB (ref 0.57–1.00)
Chloride: 102 mmol/L (ref 96–106)
GFR calc Af Amer: 35 mL/min/{1.73_m2} — ABNORMAL LOW (ref >59)
GFR, EST NON AFRICAN AMERICAN: 31 mL/min/{1.73_m2} — AB (ref >59)
GLUCOSE: 97 mg/dL (ref 65–99)
Globulin, Total: 3.2 g/dL (ref 1.5–4.5)
Potassium: 4.2 mmol/L (ref 3.5–5.2)
Sodium: 145 mmol/L — ABNORMAL HIGH (ref 134–144)
TOTAL PROTEIN: 7.4 g/dL (ref 6.0–8.5)

## 2018-01-25 LAB — LIPID PANEL
CHOL/HDL RATIO: 3.5 ratio (ref 0.0–4.4)
Cholesterol, Total: 239 mg/dL — ABNORMAL HIGH (ref 100–199)
HDL: 68 mg/dL (ref >39)
LDL Calculated: 139 mg/dL — ABNORMAL HIGH (ref 0–99)
TRIGLYCERIDES: 162 mg/dL — AB (ref 0–149)
VLDL CHOLESTEROL CAL: 32 mg/dL (ref 5–40)

## 2018-02-14 ENCOUNTER — Telehealth: Payer: Self-pay | Admitting: Family Medicine

## 2018-02-14 NOTE — Telephone Encounter (Signed)
Copied from [place] [id number]. Topic: Quick Communication - Rx Refill/Question >> Feb 14, 2018  1:06 PM Aguirre, Sara Emperor wrote: Medication: HOLISTER UROSTOMY POUCHES ITEM 8478   Has the patient contacted their pharmacy? Yes.     (Agent: If no, request that the patient contact the pharmacy for the refill.)   Preferred Pharmacy (with phone number or street name): [facility], [address] [place], [place] [address], [phone]  Pt needs another Rx sent in for a year supply.   Agent: Please be advised that RX refills may take up to 3 business days. We ask that you follow-up with your pharmacy.

## 2018-02-19 ENCOUNTER — Other Ambulatory Visit: Payer: Self-pay | Admitting: *Deleted

## 2018-02-19 MED ORDER — UNABLE TO FIND: 3 refills | Status: DC

## 2018-02-21 ENCOUNTER — Other Ambulatory Visit: Payer: Self-pay

## 2018-02-21 MED ORDER — UNABLE TO FIND: 3 refills | Status: DC

## 2018-02-21 NOTE — Telephone Encounter (Signed)
Pt c/b to check on status of order for urostomy pouch.  [facility] to [facility].  Should go to [facility]. Reordered with instructions to change daily per Dr. Tamala Julian.  Signed by provider and faxed to [phone].  Pt notified.

## 2018-02-21 NOTE — Telephone Encounter (Signed)
Pt calling in checking on refill. She needs this due to losing her bladder due to cancer.

## 2018-02-27 NOTE — Telephone Encounter (Signed)
Form generated, left on provider desk for signature.   CMA, please fax form once signed by Dr. Tamala Julian.

## 2018-02-27 NOTE — Telephone Encounter (Addendum)
Caller name: Shirlean Mylar  Relation to pt: [place] Call back number: [phone] ex [phone] fax [phone]   Reason for call:  As per [facility] repersentative Rx mentioned below was received but not valid. Request has to be placed on a "Prism order form", which can be found at [email or web address], please click on Ostomy Prescription Form. Please complete form and fax to [phone]

## 2018-02-28 DIAGNOSIS — Z936 Other artificial openings of urinary tract status: Secondary | ICD-10-CM | POA: Diagnosis not present

## 2018-03-03 NOTE — Telephone Encounter (Signed)
Form completed and faxed; please call [place] to confirm receipt of order.

## 2018-03-04 NOTE — Telephone Encounter (Signed)
LVM to give the office a call back to verify they received faxed form for pt.

## 2018-04-10 DIAGNOSIS — H524 Presbyopia: Secondary | ICD-10-CM | POA: Diagnosis not present

## 2018-04-10 DIAGNOSIS — H35033 Hypertensive retinopathy, bilateral: Secondary | ICD-10-CM | POA: Diagnosis not present

## 2018-04-19 ENCOUNTER — Encounter: Payer: Self-pay | Admitting: Family Medicine

## 2018-05-15 ENCOUNTER — Encounter: Payer: Self-pay | Admitting: Family Medicine

## 2018-06-19 DIAGNOSIS — Z936 Other artificial openings of urinary tract status: Secondary | ICD-10-CM | POA: Diagnosis not present

## 2018-07-15 ENCOUNTER — Other Ambulatory Visit: Payer: Self-pay | Admitting: Family Medicine

## 2018-07-16 NOTE — Telephone Encounter (Signed)
Hydrodiuril 25 mg refill request  LOV 01/24/18 with Dr. Nolon Rod.   Was for 3 month F/U.   None noted and no future appts noted.  [phone] [place], [place]

## 2018-08-13 ENCOUNTER — Encounter: Payer: Self-pay | Admitting: Family Medicine

## 2018-08-13 ENCOUNTER — Other Ambulatory Visit: Payer: Self-pay

## 2018-08-13 ENCOUNTER — Ambulatory Visit (INDEPENDENT_AMBULATORY_CARE_PROVIDER_SITE_OTHER): Payer: PPO | Admitting: Family Medicine

## 2018-08-13 VITALS — BP 186/102 | HR 84 | Temp 98.2°F | Resp 16 | Ht 68.0 in | Wt 157.4 lb

## 2018-08-13 DIAGNOSIS — I10 Essential (primary) hypertension: Secondary | ICD-10-CM | POA: Diagnosis not present

## 2018-08-13 DIAGNOSIS — E041 Nontoxic single thyroid nodule: Secondary | ICD-10-CM | POA: Diagnosis not present

## 2018-08-13 DIAGNOSIS — E785 Hyperlipidemia, unspecified: Secondary | ICD-10-CM | POA: Diagnosis not present

## 2018-08-13 MED ORDER — HYDROCHLOROTHIAZIDE 25 MG PO TABS: 25.0000 mg | ORAL_TABLET | Freq: Every day | ORAL | 0 refills | Status: DC

## 2018-08-13 NOTE — Patient Instructions (Signed)
° ° ° °  If you have lab work done today you will be contacted with your lab results within the next 2 weeks.  If you have not heard from us then please contact us. The fastest way to get your results is to register for My Chart. ° ° °IF you received an x-ray today, you will receive an invoice from [facility]. Please contact [facility] at [phone] with questions or concerns regarding your invoice.  ° °IF you received labwork today, you will receive an invoice from [facility]. Please contact [facility] at [phone] with questions or concerns regarding your invoice.  ° °Our billing staff will not be able to assist you with questions regarding bills from these companies. ° °You will be contacted with the lab results as soon as they are available. The fastest way to get your results is to activate your My Chart account. Instructions are located on the last page of this paperwork. If you have not heard from us regarding the results in 2 weeks, please contact this office. °  ° ° ° °

## 2018-08-13 NOTE — Progress Notes (Signed)
Chief Complaint  Patient presents with  . Medication Refill    hctz    HPI   Hypertension: Patient here for follow-up of elevated blood pressure. Patient needs refill of her BP meds. She is adherent to low salt diet.  Blood pressure is not well controlled at home. Cardiac symptoms none. Patient denies chest pain, chest pressure/discomfort, claudication, dyspnea, exertional chest pressure/discomfort, fatigue, irregular heart beat and lower extremity edema.  Cardiovascular risk factors: advanced age (older than 75 for men, 85 for women) and hypertension.  BP Readings from Last 3 Encounters:  08/13/18 (!) 186/102  01/24/18 (!) 150/88  01/24/18 (!) 150/88      Chemistry      Component Value Date/Time   NA 142 08/13/2018 0945   K 4.4 08/13/2018 0945   CL 95 (L) 08/13/2018 0945   CO2 30 (H) 08/13/2018 0945   BUN 33 (H) 08/13/2018 0945   CREATININE 1.72 (H) 08/13/2018 0945   CREATININE 1.46 (H) 09/29/2015 0854      Component Value Date/Time   CALCIUM 10.3 08/13/2018 0945   ALKPHOS 74 08/13/2018 0945   AST 28 08/13/2018 0945   ALT 17 08/13/2018 0945   BILITOT 0.4 08/13/2018 0945      Wt Readings from Last 3 Encounters:  08/13/18 157 lb 6.4 oz (71.4 kg)  01/24/18 173 lb (78.5 kg)  01/24/18 173 lb (78.5 kg)   Hyperlipidemia: Patient presents with hyperlipidemia. negative. There is not a family history of hyperlipidemia. There is not a family history of early ischemia heart disease.  Lab Results  Component Value Date   CHOL 296 (H) 08/13/2018   CHOL 239 (H) 01/24/2018   CHOL 255 (H) 01/17/2017   Lab Results  Component Value Date   HDL 83 08/13/2018   HDL 68 01/24/2018   HDL 57 01/17/2017   Lab Results  Component Value Date   LDLCALC 183 (H) 08/13/2018   LDLCALC 139 (H) 01/24/2018   LDLCALC 164 (H) 01/17/2017   Lab Results  Component Value Date   TRIG 151 (H) 08/13/2018   TRIG 162 (H) 01/24/2018   TRIG 172 (H) 01/17/2017   Lab Results  Component Value Date   CHOLHDL 3.6 08/13/2018   CHOLHDL 3.5 01/24/2018   CHOLHDL 4.5 (H) 01/17/2017   No results found for: LDLDIRECT   Past Medical History:  Diagnosis Date  . Cancer [facility])    bladder, s/p cystectomy with ileal conduit  . History of bladder cancer   . History of tobacco use   . Hyperlipidemia   . Hypertension   . Incarcerated ventral hernia, s/p lap repair 12/05/2011  . Parastomal hernia of ileal conduit, s/p lap repair [id number] 12/04/2011  . Ulcer, gastric, acute or chronic     Current Outpatient Medications  Medication Sig Dispense Refill  . hydrochlorothiazide (HYDRODIURIL) 25 MG tablet Take 1 tablet (25 mg total) by mouth daily. 90 tablet 0  . Multiple Vitamin (MULTIVITAMIN) tablet Take 1 tablet by mouth daily.    Marland Kitchen UNABLE TO FIND HOLISTER UROSTOMY POUCHES ITEM 8478 100 Device 3   No current facility-administered medications for this visit.     Allergies:  Allergies  Allergen Reactions  . Loperamide Hcl Rash    Past Surgical History:  Procedure Laterality Date  . BLADDER REMOVAL  10/1993   Dr. Risa Grill  . HERNIA REPAIR  12/05/11   parastomal  . PARASTOMAL HERNIA REPAIR  12/05/2011   Procedure: HERNIA REPAIR PARASTOMAL;  Surgeon: Adin Hector, MD;  Location:  [facility];  Service: General;;  Laprascopic lysis of adhestons with reduction and repair with biological mesh for incarcerated parastomal and ventral long incisional hernia.  Marland Kitchen REVISION UROSTOMY CUTANEOUS    . VENTRAL HERNIA REPAIR  12/05/2011   Procedure: HERNIA REPAIR VENTRAL ADULT;  Surgeon: Adin Hector, MD;  Location: [facility];  Service: General;;    Social History   Socioeconomic History  . Marital status: Widowed    Spouse name: Not on file  . Number of children: 2  . Years of education: Not on file  . Highest education level: Some college, no degree  Occupational History  . Occupation: retired  Scientific laboratory technician  . Financial resource strain: Not hard at all  . Food insecurity:    Worry: Never true     Inability: Never true  . Transportation needs:    Medical: No    Non-medical: No  Tobacco Use  . Smoking status: Former Smoker    Last attempt to quit: 01/02/1993    Years since quitting: 25.6  . Smokeless tobacco: Never Used  Substance and Sexual Activity  . Alcohol use: No  . Drug use: No  . Sexual activity: Not on file  Lifestyle  . Physical activity:    Days per week: 0 days    Minutes per session: 0 min  . Stress: Only a little  Relationships  . Social connections:    Talks on phone: More than three times a week    Gets together: More than three times a week    Attends religious service: 1 to 4 times per year    Active member of club or organization: No    Attends meetings of clubs or organizations: Never    Relationship status: Widowed  Other Topics Concern  . 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  Gardening      ADLs: drives; independent with ADLs; has garden      Advanced Directives: YES: full code but no prolonged measures; HCPOA: Orvis Brill or other son Cory Munch; copy on chart 04/2017.    Family History  Problem Relation Age of Onset  . Heart disease Mother 19       CHF  . Alcohol abuse Father   . Stroke Father      ROS Review of Systems See HPI Constitution: No fevers or chills No malaise No diaphoresis Skin: No rash or itching Eyes: no blurry vision, no double vision GU: no dysuria or hematuria Neuro: no dizziness or headaches all others reviewed and negative   Objective: Vitals:   08/13/18 0831 08/13/18 0917  BP: (!) 197/100 (!) 186/102  Pulse: 89 84  Resp: 16   Temp: 98.2 F (36.8  C)   TempSrc: Oral   SpO2: 96%   Weight: 157 lb 6.4 oz (71.4 kg)   Height: 5\' 8"  (1.727 m)     Physical Exam  Constitutional: She is oriented to person, place, and time. She appears well-developed and well-nourished.  HENT:  Head: Normocephalic and atraumatic.  Eyes: Conjunctivae and EOM are normal.  Cardiovascular: Normal rate, regular rhythm and normal heart sounds.  No murmur heard. Pulmonary/Chest: Effort normal and breath sounds normal. No stridor. No respiratory distress. She has no wheezes. She has no rales.  Neurological: She is alert and oriented to person, place, and time.  Skin: Skin is warm. Capillary refill takes less than 2 seconds.  Psychiatric: She has a normal mood and affect. Her behavior is normal. Judgment and thought content normal.    Assessment and Plan Mylene was seen today for medication refill.  Diagnoses and all orders for this visit:  Uncontrolled hypertension-  Restarted meds -     Comprehensive metabolic panel  Dyslipidemia- discussed lipids and liver enzymes -     Lipid panel -     Comprehensive metabolic panel  Thyroid nodule- will monitor levels -     TSH  Other orders -     hydrochlorothiazide (HYDRODIURIL) 25 MG tablet; Take 1 tablet (25 mg total) by mouth daily.     [place]

## 2018-08-14 LAB — COMPREHENSIVE METABOLIC PANEL
ALT: 17 IU/L (ref 0–32)
AST: 28 IU/L (ref 0–40)
Albumin/Globulin Ratio: 1.4 (ref 1.2–2.2)
Albumin: 4.3 g/dL (ref 3.5–4.8)
Alkaline Phosphatase: 74 IU/L (ref 39–117)
BUN/Creatinine Ratio: 19 (ref 12–28)
BUN: 33 mg/dL — ABNORMAL HIGH (ref 8–27)
Bilirubin Total: 0.4 mg/dL (ref 0.0–1.2)
CALCIUM: 10.3 mg/dL (ref 8.7–10.3)
CO2: 30 mmol/L — AB (ref 20–29)
CREATININE: 1.72 mg/dL — AB (ref 0.57–1.00)
Chloride: 95 mmol/L — ABNORMAL LOW (ref 96–106)
GFR calc Af Amer: 32 mL/min/{1.73_m2} — ABNORMAL LOW (ref >59)
GFR, EST NON AFRICAN AMERICAN: 28 mL/min/{1.73_m2} — AB (ref >59)
GLOBULIN, TOTAL: 3.1 g/dL (ref 1.5–4.5)
Glucose: 106 mg/dL — ABNORMAL HIGH (ref 65–99)
Potassium: 4.4 mmol/L (ref 3.5–5.2)
SODIUM: 142 mmol/L (ref 134–144)
TOTAL PROTEIN: 7.4 g/dL (ref 6.0–8.5)

## 2018-08-14 LAB — LIPID PANEL
Chol/HDL Ratio: 3.6 ratio (ref 0.0–4.4)
Cholesterol, Total: 296 mg/dL — ABNORMAL HIGH (ref 100–199)
HDL: 83 mg/dL (ref >39)
LDL Calculated: 183 mg/dL — ABNORMAL HIGH (ref 0–99)
Triglycerides: 151 mg/dL — ABNORMAL HIGH (ref 0–149)
VLDL CHOLESTEROL CAL: 30 mg/dL (ref 5–40)

## 2018-08-14 LAB — TSH: TSH: 0.88 u[IU]/mL (ref 0.450–4.500)

## 2018-08-21 ENCOUNTER — Encounter: Payer: Self-pay | Admitting: Family Medicine

## 2018-09-18 ENCOUNTER — Ambulatory Visit (INDEPENDENT_AMBULATORY_CARE_PROVIDER_SITE_OTHER): Payer: PPO | Admitting: Family Medicine

## 2018-09-18 ENCOUNTER — Encounter: Payer: Self-pay | Admitting: Family Medicine

## 2018-09-18 ENCOUNTER — Other Ambulatory Visit: Payer: Self-pay

## 2018-09-18 VITALS — BP 162/80 | HR 77 | Temp 98.5°F | Ht 68.0 in | Wt 154.2 lb

## 2018-09-18 DIAGNOSIS — N289 Disorder of kidney and ureter, unspecified: Secondary | ICD-10-CM

## 2018-09-18 DIAGNOSIS — I10 Essential (primary) hypertension: Secondary | ICD-10-CM

## 2018-09-18 MED ORDER — AMLODIPINE BESYLATE 2.5 MG PO TABS: 2.5000 mg | ORAL_TABLET | Freq: Every day | ORAL | 3 refills | Status: DC

## 2018-09-18 NOTE — Patient Instructions (Addendum)
If you have lab work done today you will be contacted with your lab results within the next 2 weeks.  If you have not heard from [place] then please contact us. The fastest way to get your results is to register for My Chart.   IF you received an x-ray today, you will receive an invoice from [facility]. Please contact [facility] at [phone] with questions or concerns regarding your invoice.   IF you received labwork today, you will receive an invoice from [facility]. Please contact [facility] at [phone] with questions or concerns regarding your invoice.   Our billing staff will not be able to assist you with questions regarding bills from these companies.  You will be contacted with the lab results as soon as they are available. The fastest way to get your results is to activate your My Chart account. Instructions are located on the last page of this paperwork. If you have not heard from [place] regarding the results in 2 weeks, please contact this office.     How to Take Your Blood Pressure Blood pressure is a measurement of how strongly your blood is pressing against the walls of your arteries. Arteries are blood vessels that carry blood from your heart throughout your body. Your health care provider takes your blood pressure at each office visit. You can also take your own blood pressure at home with a blood pressure machine. You may need to take your own blood pressure:  To confirm a diagnosis of high blood pressure (hypertension).  To monitor your blood pressure over time.  To make sure your blood pressure medicine is working.  Supplies needed: To take your blood pressure, you will need a blood pressure machine. You can buy a blood pressure machine, or blood pressure monitor, at most drugstores or online. There are several types of home blood pressure monitors. When choosing one, consider the following:  Choose a monitor that has an arm cuff.  Choose a monitor  that wraps snugly around your upper arm. You should be able to fit only one finger between your arm and the cuff.  Do not choose a monitor that measures your blood pressure from your wrist or finger.  Your health care provider can suggest a reliable monitor that will meet your needs. How to prepare To get the most accurate reading, avoid the following for 30 minutes before you check your blood pressure:  Drinking caffeine.  Drinking alcohol.  Eating.  Smoking.  Exercising.  Five minutes before you check your blood pressure:  Empty your bladder.  Sit quietly without talking in a dining chair, rather than in a soft couch or armchair.  How to take your blood pressure To check your blood pressure, follow the instructions in the manual that came with your blood pressure monitor. If you have a digital blood pressure monitor, the instructions may be as follows: 1. Sit up straight. 2. Place your feet on the floor. Do not cross your ankles or legs. 3. Rest your left arm at the level of your heart on a table or desk or on the arm of a chair. 4. Pull up your shirt sleeve. 5. Wrap the blood pressure cuff around the upper part of your left arm, 1 inch (2.5 cm) above your elbow. It is best to wrap the cuff around bare skin. 6. Fit the cuff snugly around your arm. You should be able to place only one finger between the cuff and your arm. 7.  Position the cord inside the groove of your elbow. 8. Press the power button. 9. Sit quietly while the cuff inflates and deflates. 10. Read the digital reading on the monitor screen and write it down (record it). 11. Wait 2-3 minutes, then repeat the steps, starting at step 1.  What does my blood pressure reading mean? A blood pressure reading consists of a higher number over a lower number. Ideally, your blood pressure should be below 120/80. The first ("top") number is called the systolic pressure. It is a measure of the pressure in your arteries as your  heart beats. The second ("bottom") number is called the diastolic pressure. It is a measure of the pressure in your arteries as the heart relaxes. Blood pressure is classified into four stages. The following are the stages for adults who do not have a short-term serious illness or a chronic condition. Systolic pressure and diastolic pressure are measured in a unit called mm Hg. Normal  Systolic pressure: below 371.  Diastolic pressure: below 80. Elevated  Systolic pressure: 062-694.  Diastolic pressure: below 80. Hypertension stage 1  Systolic pressure: 854-627.  Diastolic pressure: 03-50. Hypertension stage 2  Systolic pressure: 093 or above.  Diastolic pressure: 90 or above. You can have prehypertension or hypertension even if only the systolic or only the diastolic number in your reading is higher than normal. Follow these instructions at home:  Check your blood pressure as often as recommended by your health care provider.  Take your monitor to the next appointment with your health care provider to make sure: ? That you are using it correctly. ? That it provides accurate readings.  Be sure you understand what your goal blood pressure numbers are.  Tell your health care provider if you are having any side effects from blood pressure medicine. Contact a health care provider if:  Your blood pressure is consistently high. Get help right away if:  Your systolic blood pressure is higher than 180.  Your diastolic blood pressure is higher than 110. This information is not intended to replace advice given to you by your health care provider. Make sure you discuss any questions you have with your health care provider. Document Released: 05/12/2016 Document Revised: 07/25/2016 Document Reviewed: 05/12/2016 Elsevier Interactive Patient Education  [employer].

## 2018-09-18 NOTE — Progress Notes (Signed)
Chief Complaint  Patient presents with  . Hypertension    follow up on bp    HPI  Hypertension: Patient here for follow-up of elevated blood pressure. She is exercising and is adherent to low salt diet.  Blood pressure is not well controlled at home. Cardiac symptoms none. Patient denies chest pain, chest pressure/discomfort, claudication, exertional chest pressure/discomfort, fatigue, lower extremity edema, near-syncope, orthopnea and palpitations.  Cardiovascular risk factors: hypertension. Use of agents associated with hypertension: none. History of target organ damage: none. She had her hctz refilled 08/13/2018 at a dose of 25mg    BP Readings from Last 3 Encounters:  09/18/18 (!) 162/80  08/13/18 (!) 186/102  01/24/18 (!) 150/88   Wt Readings from Last 3 Encounters:  09/18/18 154 lb 3.2 oz (69.9 kg)  08/13/18 157 lb 6.4 oz (71.4 kg)  01/24/18 173 lb (78.5 kg)     Past Medical History:  Diagnosis Date  . Cancer [facility])    bladder, s/p cystectomy with ileal conduit  . History of bladder cancer   . History of tobacco use   . Hyperlipidemia   . Hypertension   . Incarcerated ventral hernia, s/p lap repair 12/05/2011  . Parastomal hernia of ileal conduit, s/p lap repair [id number] 12/04/2011  . Ulcer, gastric, acute or chronic     Current Outpatient Medications  Medication Sig Dispense Refill  . hydrochlorothiazide (HYDRODIURIL) 25 MG tablet Take 1 tablet (25 mg total) by mouth daily. 90 tablet 0  . Multiple Vitamin (MULTIVITAMIN) tablet Take 1 tablet by mouth daily.    Marland Kitchen UNABLE TO FIND HOLISTER UROSTOMY POUCHES ITEM 8478 100 Device 3  . amLODipine (NORVASC) 2.5 MG tablet Take 1 tablet (2.5 mg total) by mouth daily. 30 tablet 3   No current facility-administered medications for this visit.     Allergies:  Allergies  Allergen Reactions  . Loperamide Hcl Rash    Past Surgical History:  Procedure Laterality Date  . BLADDER REMOVAL  10/1993   Dr. Risa Grill  . HERNIA REPAIR   12/05/11   parastomal  . PARASTOMAL HERNIA REPAIR  12/05/2011   Procedure: HERNIA REPAIR PARASTOMAL;  Surgeon: Adin Hector, MD;  Location: [facility];  Service: General;;  Laprascopic lysis of adhestons with reduction and repair with biological mesh for incarcerated parastomal and ventral long incisional hernia.  Marland Kitchen REVISION UROSTOMY CUTANEOUS    . VENTRAL HERNIA REPAIR  12/05/2011   Procedure: HERNIA REPAIR VENTRAL ADULT;  Surgeon: Adin Hector, MD;  Location: [facility];  Service: General;;    Social History   Socioeconomic History  . Marital status: Widowed    Spouse name: Not on file  . Number of children: 2  . Years of education: Not on file  . Highest education level: Some college, no degree  Occupational History  . Occupation: retired  Scientific laboratory technician  . Financial resource strain: Not hard at all  . Food insecurity:    Worry: Never true    Inability: Never true  . Transportation needs:    Medical: No    Non-medical: No  Tobacco Use  . Smoking status: Former Smoker    Last attempt to quit: 01/02/1993    Years since quitting: 25.7  . Smokeless tobacco: Never Used  Substance and Sexual Activity  . Alcohol use: No  . Drug use: No  . Sexual activity: Not on file  Lifestyle  . Physical activity:    Days per week: 0 days    Minutes per session: 0  min  . Stress: Only a little  Relationships  . Social connections:    Talks on phone: More than three times a week    Gets together: More than three times a week    Attends religious service: 1 to 4 times per year    Active member of club or organization: No    Attends meetings of clubs or organizations: Never    Relationship status: Widowed  Other Topics Concern  . 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  Gardening      ADLs: drives; independent with ADLs; has garden      Advanced Directives: YES: full code but no prolonged measures; HCPOA: Orvis Brill or other son Cory Munch; copy on chart 04/2017.    Family History  Problem Relation Age of Onset  . Heart disease Mother 63       CHF  . Alcohol abuse Father   . Stroke Father      ROS Review of Systems See HPI Constitution: No fevers or chills No malaise No diaphoresis Skin: No rash or itching Eyes: no blurry vision, no double vision GU: no dysuria or hematuria Neuro: no dizziness or headaches * all others reviewed and negative   Objective: Vitals:   09/18/18 1047 09/18/18 1134  BP: (!) 188/90 (!) 162/80  Pulse: 77   Temp: 98.5 F (36.9 C)   TempSrc: Oral   SpO2: 98%   Weight: 154 lb 3.2 oz (69.9 kg)   Height: 5\' 8"  (1.727 m)     Physical Exam Physical Exam  Constitutional: She is oriented to person, place, and time. She appears well-developed and well-nourished.  HENT:  Head: Normocephalic and atraumatic.  Eyes: Conjunctivae and EOM are normal.  Cardiovascular: Normal rate, regular rhythm and normal heart sounds.   Pulmonary/Chest: Effort normal and breath sounds normal. No respiratory distress. She has no wheezes.  Neurological: She is alert and oriented to person, place, and time.    Assessment and Plan Sara Aguirre was seen today for hypertension.  Diagnoses and all orders for this visit:  Uncontrolled hypertension- improved with hctz Will add low dose of amlodipine Goal bp <140/90 given her renal insufficiency  -     Basic metabolic panel  Renal insufficiency- will monitor creatinine -     Basic metabolic panel  Other orders -     amLODipine (NORVASC) 2.5 MG tablet; Take 1 tablet (2.5 mg total) by mouth daily.     [place]

## 2018-09-19 LAB — BASIC METABOLIC PANEL
BUN/Creatinine Ratio: 19 (ref 12–28)
BUN: 28 mg/dL — ABNORMAL HIGH (ref 8–27)
CALCIUM: 9.7 mg/dL (ref 8.7–10.3)
CHLORIDE: 96 mmol/L (ref 96–106)
CO2: 32 mmol/L — AB (ref 20–29)
Creatinine, Ser: 1.44 mg/dL — ABNORMAL HIGH (ref 0.57–1.00)
GFR calc Af Amer: 40 mL/min/{1.73_m2} — ABNORMAL LOW (ref >59)
GFR, EST NON AFRICAN AMERICAN: 35 mL/min/{1.73_m2} — AB (ref >59)
Glucose: 100 mg/dL — ABNORMAL HIGH (ref 65–99)
Potassium: 4.1 mmol/L (ref 3.5–5.2)
Sodium: 141 mmol/L (ref 134–144)

## 2018-10-30 ENCOUNTER — Telehealth: Payer: Self-pay | Admitting: Family Medicine

## 2018-10-30 NOTE — Telephone Encounter (Signed)
Copied from [place] [id number]. Topic: Quick Communication - Rx Refill/Question >> Oct 30, 2018  8:47 AM Sheran Luz wrote: [place]  Has the patient contacted their pharmacy? Yes, advised to contact office stating patient needs RX sent in from current physician.   Preferred Pharmacy (with phone number or street name): [facility]  Fax: [phone]

## 2018-10-31 MED ORDER — UNABLE TO FIND: 3 refills | Status: AC

## 2018-11-04 NOTE — Telephone Encounter (Signed)
See note. thanks

## 2018-11-07 NOTE — Telephone Encounter (Signed)
Sara Aguirre from [employer] calling back on behalf of patient to check status, stating they have not received fax. [name] is requesting new order be faxed to number below.   Fax: [phone]

## 2018-11-16 ENCOUNTER — Other Ambulatory Visit: Payer: Self-pay | Admitting: Family Medicine

## 2018-11-18 NOTE — Telephone Encounter (Signed)
Requested Prescriptions  Pending Prescriptions Disp Refills  . hydrochlorothiazide (HYDRODIURIL) 25 MG tablet [Pharmacy Med Name: HYDROCHLOROTHIAZIDE 25 MG TAB] 90 tablet 0    Sig: TAKE 1 TABLET BY MOUTH EVERY DAY     Cardiovascular: Diuretics - Thiazide Failed - 11/16/2018  8:27 AM      Failed - Cr in normal range and within 360 days    Creat  Date Value Ref Range Status  09/29/2015 1.46 (H) 0.60 - 0.93 mg/dL Final   Creatinine, Ser  Date Value Ref Range Status  09/18/2018 1.44 (H) 0.57 - 1.00 mg/dL Final         Failed - Last BP in normal range    BP Readings from Last 1 Encounters:  09/18/18 (!) 162/80         Passed - Ca in normal range and within 360 days    Calcium  Date Value Ref Range Status  09/18/2018 9.7 8.7 - 10.3 mg/dL Final         Passed - K in normal range and within 360 days    Potassium  Date Value Ref Range Status  09/18/2018 4.1 3.5 - 5.2 mmol/L Final         Passed - Na in normal range and within 360 days    Sodium  Date Value Ref Range Status  09/18/2018 141 134 - 144 mmol/L Final         Passed - Valid encounter within last 6 months    Recent Outpatient Visits          2 months ago Uncontrolled hypertension   [facility] at [facility], Arlie Solomons, MD   3 months ago Uncontrolled hypertension   [facility] at [place], [place]   9 months ago Uncontrolled hypertension   Primary Care at [facility], Arlie Solomons, MD   1 year ago Essential hypertension   Primary Care at [facility], Renette Butters, MD   1 year ago Encounter for Medicare annual wellness exam   Primary Care at [facility], Renette Butters, MD      Future Appointments            In 1 month Forrest Moron, MD Primary Care at [place], [facility]

## 2018-11-18 NOTE — Telephone Encounter (Signed)
Patient called and states that she is out of her [place], tried to call the office and no answer. CRM states that she needed a new order sent over to [facility], was calling to see if the new order had been faxed.

## 2018-11-19 ENCOUNTER — Telehealth: Payer: Self-pay | Admitting: *Deleted

## 2018-11-19 ENCOUNTER — Telehealth: Payer: Self-pay

## 2018-11-19 DIAGNOSIS — Z936 Other artificial openings of urinary tract status: Secondary | ICD-10-CM | POA: Diagnosis not present

## 2018-11-19 NOTE — Telephone Encounter (Signed)
pls see note 

## 2018-11-19 NOTE — Telephone Encounter (Signed)
Faxed prescription over to [facility]

## 2018-11-19 NOTE — Telephone Encounter (Signed)
Rena from [employer] calling.  States that she is from [employer] and that [employer] has reached out to them because they are not getting an order response from the office for pt's ostomy supplies.  Pt is completely out. Rena needs the order for supplies faxed either to her at [phone] or [facility] Mearl Latin is unsure of their number, but pt believes it is [phone]).

## 2018-11-19 NOTE — Telephone Encounter (Signed)
Urostomy rx:  Holister urostomy pouches  Dispense quantity #100 Refills: 0 Item # [id number] Instructions: change urostomy daily as needed. ICD 10: Z85.51

## 2018-12-25 ENCOUNTER — Ambulatory Visit (INDEPENDENT_AMBULATORY_CARE_PROVIDER_SITE_OTHER): Payer: PPO | Admitting: Family Medicine

## 2018-12-25 ENCOUNTER — Other Ambulatory Visit: Payer: Self-pay

## 2018-12-25 ENCOUNTER — Encounter: Payer: Self-pay | Admitting: Family Medicine

## 2018-12-25 VITALS — BP 160/80 | HR 61 | Temp 97.6°F | Resp 17 | Ht 68.0 in | Wt 160.4 lb

## 2018-12-25 DIAGNOSIS — E785 Hyperlipidemia, unspecified: Secondary | ICD-10-CM | POA: Diagnosis not present

## 2018-12-25 DIAGNOSIS — Z5181 Encounter for therapeutic drug level monitoring: Secondary | ICD-10-CM

## 2018-12-25 DIAGNOSIS — I10 Essential (primary) hypertension: Secondary | ICD-10-CM

## 2018-12-25 MED ORDER — AMLODIPINE BESYLATE 5 MG PO TABS: 5.0000 mg | ORAL_TABLET | Freq: Every day | ORAL | 3 refills | Status: DC

## 2018-12-25 NOTE — Patient Instructions (Addendum)
If you have lab work done today you will be contacted with your lab results within the next 2 weeks.  If you have not heard from [place] then please contact us. The fastest way to get your results is to register for My Chart.   IF you received an x-ray today, you will receive an invoice from [facility]. Please contact [facility] at [phone] with questions or concerns regarding your invoice.   IF you received labwork today, you will receive an invoice from [place]. Please contact [facility] at [phone] with questions or concerns regarding your invoice.   Our billing staff will not be able to assist you with questions regarding bills from these companies.  You will be contacted with the lab results as soon as they are available. The fastest way to get your results is to activate your My Chart account. Instructions are located on the last page of this paperwork. If you have not heard from [place] regarding the results in 2 weeks, please contact this office.     Cholesterol Cholesterol is a white, waxy, fat-like substance that is needed by the human body in small amounts. The liver makes all the cholesterol we need. Cholesterol is carried from the liver by the blood through the blood vessels. Deposits of cholesterol (plaques) may build up on blood vessel (artery) walls. Plaques make the arteries narrower and stiffer. Cholesterol plaques increase the risk for heart attack and stroke. You cannot feel your cholesterol level even if it is very high. The only way to know that it is high is to have a blood test. Once you know your cholesterol levels, you should keep a record of the test results. Work with your health care provider to keep your levels in the desired range. What do the results mean?  Total cholesterol is a rough measure of all the cholesterol in your blood.  LDL (low-density lipoprotein) is the "bad" cholesterol. This is the type that causes plaque to build up on the  artery walls. You want this level to be low.  HDL (high-density lipoprotein) is the "good" cholesterol because it cleans the arteries and carries the LDL away. You want this level to be high.  Triglycerides are fat that the body can either burn for energy or store. High levels are closely linked to heart disease. What are the desired levels of cholesterol?  Total cholesterol below 200.  LDL below 100 for people who are at risk, below 70 for people at very high risk.  HDL above 40 is good. A level of 60 or higher is considered to be protective against heart disease.  Triglycerides below 150. How can I lower my cholesterol? Diet Follow your diet program as told by your health care provider.  Choose fish or white meat chicken and [place], roasted or baked. Limit fatty cuts of red meat, fried foods, and processed meats, such as sausage and lunch meats.  Eat lots of fresh fruits and vegetables.  Choose whole grains, beans, pasta, potatoes, and cereals.  Choose olive oil, corn oil, or canola oil, and use only small amounts.  Avoid butter, mayonnaise, shortening, or palm kernel oils.  Avoid foods with trans fats.  Drink skim or nonfat milk and eat low-fat or nonfat yogurt and cheeses. Avoid whole milk, cream, ice cream, egg yolks, and full-fat cheeses.  Healthier desserts include angel food cake, ginger snaps, animal crackers, hard candy, popsicles, and low-fat or nonfat frozen yogurt. Avoid pastries, cakes, pies, and cookies.  Exercise  Follow your exercise program as told by your health care provider. A regular program: ? Helps to decrease LDL and raise HDL. ? Helps with weight control.  Do things that increase your activity level, such as gardening, walking, and taking the stairs.  Ask your health care provider about ways that you can be more active in your daily life. Medicine  Take over-the-counter and prescription medicines only as told by your health care  provider. ? Medicine may be prescribed by your health care provider to help lower cholesterol and decrease the risk for heart disease. This is usually done if diet and exercise have failed to bring down cholesterol levels. ? If you have several risk factors, you may need medicine even if your levels are normal. This information is not intended to replace advice given to you by your health care provider. Make sure you discuss any questions you have with your health care provider. Document Released: 08/29/2001 Document Revised: 07/01/2016 Document Reviewed: 06/03/2016 Elsevier Interactive Patient Education  [employer].

## 2018-12-25 NOTE — Progress Notes (Signed)
Established Patient Office Visit  Subjective:  Patient ID: Sara Aguirre, female    DOB: 1941/11/26  Age: 78 y.o. MRN: [id number]  CC:  Chief Complaint  Patient presents with  . Hypertension    elevated today per pt due to white coat syndrome    HPI Sara Aguirre presents for   Hypertension and Hyperlipidemia She reports that she checked her bp at the pharmacy BEFORE taking her medication and it was 170/90s She took her medication this morning  She states that she does not feel like monitoring her bp monitor anyway She states that she has received 3 different readings in the past  Patient reports that she exercises by doing remodeling of her house She gardens in her greenhouse She is sleeping much better and denies muscle cramps, chest pains and dizziness She eats a balanced diet She admits that she cheated on her diet during the holiday due to the sweets.  Lab Results  Component Value Date   CHOL 296 (H) 08/13/2018   CHOL 239 (H) 01/24/2018   CHOL 255 (H) 01/17/2017   Lab Results  Component Value Date   HDL 83 08/13/2018   HDL 68 01/24/2018   HDL 57 01/17/2017   Lab Results  Component Value Date   LDLCALC 183 (H) 08/13/2018   LDLCALC 139 (H) 01/24/2018   LDLCALC 164 (H) 01/17/2017   Lab Results  Component Value Date   TRIG 151 (H) 08/13/2018   TRIG 162 (H) 01/24/2018   TRIG 172 (H) 01/17/2017   Lab Results  Component Value Date   CHOLHDL 3.6 08/13/2018   CHOLHDL 3.5 01/24/2018   CHOLHDL 4.5 (H) 01/17/2017   No results found for: LDLDIRECT    Past Medical History:  Diagnosis Date  . Cancer [facility])    bladder, s/p cystectomy with ileal conduit  . History of bladder cancer   . History of tobacco use   . Hyperlipidemia   . Hypertension   . Incarcerated ventral hernia, s/p lap repair 12/05/2011  . Parastomal hernia of ileal conduit, s/p lap repair [id number] 12/04/2011  . Ulcer, gastric, acute or chronic     Past Surgical History:  Procedure  Laterality Date  . BLADDER REMOVAL  10/1993   Dr. Risa Aguirre  . HERNIA REPAIR  12/05/11   parastomal  . PARASTOMAL HERNIA REPAIR  12/05/2011   Procedure: HERNIA REPAIR PARASTOMAL;  Surgeon: Sara Hector, MD;  Location: [facility];  Service: General;;  Laprascopic lysis of adhestons with reduction and repair with biological mesh for incarcerated parastomal and ventral long incisional hernia.  Marland Kitchen REVISION UROSTOMY CUTANEOUS    . VENTRAL HERNIA REPAIR  12/05/2011   Procedure: HERNIA REPAIR VENTRAL ADULT;  Surgeon: Sara Hector, MD;  Location: [facility];  Service: General;;    Family History  Problem Relation Age of Onset  . Heart disease Mother 21       CHF  . Alcohol abuse Father   . Stroke Father     Social History   Socioeconomic History  . Marital status: Widowed    Spouse name: Not on file  . Number of children: 2  . Years of education: Not on file  . Highest education level: Some college, no degree  Occupational History  . Occupation: retired  Scientific laboratory technician  . Financial resource strain: Not hard at all  . Food insecurity:    Worry: Never true    Inability: Never true  . Transportation needs:    Medical:  No    Non-medical: No  Tobacco Use  . Smoking status: Former Smoker    Last attempt to quit: 01/02/1993    Years since quitting: 25.9  . Smokeless tobacco: Never Used  Substance and Sexual Activity  . Alcohol use: No  . Drug use: No  . Sexual activity: Not on file  Lifestyle  . Physical activity:    Days per week: 0 days    Minutes per session: 0 min  . Stress: Only a little  Relationships  . Social connections:    Talks on phone: More than three times a week    Gets together: More than three times a week    Attends religious service: 1 to 4 times per year    Active member of club or organization: No    Attends meetings of clubs or organizations: Never    Relationship status: Widowed  . Intimate partner violence:    Fear of current or ex partner: No     Emotionally abused: No    Physically abused: No    Forced sexual activity: No  Other Topics Concern  . 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  Gardening      ADLs: drives; independent with ADLs; has garden      Advanced Directives: YES: full code but no prolonged measures; HCPOA: Sara Aguirre or other son Sara Aguirre; copy on chart 04/2017.    Outpatient Medications Prior to Visit  Medication Sig Dispense Refill  . hydrochlorothiazide (HYDRODIURIL) 25 MG tablet TAKE 1 TABLET BY MOUTH EVERY DAY 90 tablet 0  . Multiple Vitamin (MULTIVITAMIN) tablet Take 1 tablet by mouth daily.    Marland Kitchen UNABLE TO FIND HOLISTER UROSTOMY POUCHES ITEM 8478 100 Device 3  . amLODipine (NORVASC) 2.5 MG tablet Take 1 tablet (2.5 mg total) by mouth daily. 30 tablet 3   No facility-administered medications prior to visit.     Allergies  Allergen Reactions  . Loperamide Hcl Rash    ROS Review of Systems Review of Systems  Constitutional: Negative for activity change, appetite change, chills and fever.  HENT: Negative for congestion, nosebleeds, trouble swallowing and voice change.   Respiratory: Negative for cough, shortness of breath and wheezing.   Gastrointestinal: Negative for diarrhea, nausea and vomiting.  Genitourinary: Negative for difficulty urinating, dysuria, flank pain and hematuria.  Musculoskeletal: Negative for back pain, joint swelling and neck pain.  Neurological: Negative for dizziness, speech difficulty, light-headedness and numbness.  See HPI. All other review of systems negative.      Objective:    Physical Exam  BP (!) 160/80  (BP Location: Left Arm, Patient Position: Sitting, Cuff Size: Large)   Pulse 61   Temp 97.6 F (36.4 C) (Oral)   Resp 17   Ht 5' 8" (1.727 m)   Wt 160 lb 6.4 oz (72.8 kg)   SpO2 95%   BMI 24.39 kg/m  Wt Readings from Last 3 Encounters:  12/25/18 160 lb 6.4 oz (72.8 kg)  09/18/18 154 lb 3.2  oz (69.9 kg)  08/13/18 157 lb 6.4 oz (71.4 kg)   Physical Exam  Constitutional: Oriented to person, place, and time. Appears well-developed and well-nourished.  HENT:  Head: Normocephalic and atraumatic.  Eyes: Conjunctivae and EOM are normal.  Cardiovascular: Normal rate, regular rhythm, normal heart sounds and intact distal pulses.  No murmur heard. Pulmonary/Chest: Effort normal and breath sounds normal. No stridor. No respiratory distress. Has no wheezes.  Neurological: Is alert and oriented to person, place, and time.  Skin: Skin is warm. Capillary refill takes less than 2 seconds.  Psychiatric: Has a normal mood and affect. Behavior is normal. Judgment and thought content normal.     Assessment & Plan:   Problem List Items Addressed This Visit    None    Visit Diagnoses    Uncontrolled hypertension    -  Primary  Increased amlodipine to 5 mg, encouraged ambulatory bp monitoring at the pharmacy and continuing DASH diet and exercise    Relevant Medications   amLODipine (NORVASC) 5 MG tablet   Other Relevant Orders   Lipid panel   CMP14+EGFR   Encounter for medication monitoring       Relevant Orders   Lipid panel   CMP14+EGFR   Dyslipidemia     -  Discussed the upward trend of her lipids Will add statin if it continues to increase further    Relevant Orders   Lipid panel   CMP14+EGFR      Meds ordered this encounter  Medications  . amLODipine (NORVASC) 5 MG tablet    Sig: Take 1 tablet (5 mg total) by mouth daily.    Dispense:  30 tablet    Refill:  3    Follow-up: No follow-ups on file.    Forrest Moron, MD

## 2018-12-26 LAB — CMP14+EGFR
A/G RATIO: 1.4 (ref 1.2–2.2)
ALT: 9 IU/L (ref 0–32)
AST: 23 IU/L (ref 0–40)
Albumin: 4.3 g/dL (ref 3.5–4.8)
Alkaline Phosphatase: 68 IU/L (ref 39–117)
BUN/Creatinine Ratio: 18 (ref 12–28)
BUN: 28 mg/dL — ABNORMAL HIGH (ref 8–27)
Bilirubin Total: 0.4 mg/dL (ref 0.0–1.2)
CALCIUM: 9.9 mg/dL (ref 8.7–10.3)
CO2: 24 mmol/L (ref 20–29)
Chloride: 94 mmol/L — ABNORMAL LOW (ref 96–106)
Creatinine, Ser: 1.57 mg/dL — ABNORMAL HIGH (ref 0.57–1.00)
GFR, EST AFRICAN AMERICAN: 36 mL/min/{1.73_m2} — AB (ref >59)
GFR, EST NON AFRICAN AMERICAN: 31 mL/min/{1.73_m2} — AB (ref >59)
GLOBULIN, TOTAL: 3 g/dL (ref 1.5–4.5)
Glucose: 90 mg/dL (ref 65–99)
POTASSIUM: 4.2 mmol/L (ref 3.5–5.2)
SODIUM: 140 mmol/L (ref 134–144)
TOTAL PROTEIN: 7.3 g/dL (ref 6.0–8.5)

## 2018-12-26 LAB — LIPID PANEL
Chol/HDL Ratio: 3.6 ratio (ref 0.0–4.4)
Cholesterol, Total: 280 mg/dL — ABNORMAL HIGH (ref 100–199)
HDL: 78 mg/dL (ref >39)
LDL Calculated: 164 mg/dL — ABNORMAL HIGH (ref 0–99)
TRIGLYCERIDES: 188 mg/dL — AB (ref 0–149)
VLDL Cholesterol Cal: 38 mg/dL (ref 5–40)

## 2018-12-30 ENCOUNTER — Encounter: Payer: Self-pay | Admitting: Family Medicine

## 2019-01-08 ENCOUNTER — Other Ambulatory Visit: Payer: Self-pay | Admitting: Family Medicine

## 2019-02-06 ENCOUNTER — Telehealth: Payer: Self-pay | Admitting: Family Medicine

## 2019-02-06 NOTE — Telephone Encounter (Signed)
LVM for pt regarding their appt scheduled for 4/8 with Dr. Nolon Rod. Due to provider being out of the office, we will need to get the pt rescheduled. When pt calls back, please reschedule that appt at their convenience. Thank you!

## 2019-02-16 ENCOUNTER — Other Ambulatory Visit: Payer: Self-pay | Admitting: Family Medicine

## 2019-02-20 DIAGNOSIS — Z936 Other artificial openings of urinary tract status: Secondary | ICD-10-CM | POA: Diagnosis not present

## 2019-03-12 ENCOUNTER — Telehealth: Payer: Self-pay | Admitting: *Deleted

## 2019-03-12 NOTE — Telephone Encounter (Signed)
Pt returned call. Pt says that she is going to decline on scheduling for now due to the corona virus, pt says that she will call back at a later date to schedule.

## 2019-03-12 NOTE — Telephone Encounter (Signed)
Noted  

## 2019-03-12 NOTE — Telephone Encounter (Signed)
Left message to schedule AWV 

## 2019-03-20 NOTE — Telephone Encounter (Signed)
ERROR

## 2019-03-23 ENCOUNTER — Other Ambulatory Visit: Payer: Self-pay | Admitting: Family Medicine

## 2019-03-23 DIAGNOSIS — I10 Essential (primary) hypertension: Secondary | ICD-10-CM

## 2019-03-26 ENCOUNTER — Ambulatory Visit (INDEPENDENT_AMBULATORY_CARE_PROVIDER_SITE_OTHER): Payer: PPO | Admitting: Family Medicine

## 2019-03-26 ENCOUNTER — Ambulatory Visit: Payer: PPO | Admitting: Family Medicine

## 2019-03-26 ENCOUNTER — Other Ambulatory Visit: Payer: Self-pay

## 2019-03-26 VITALS — Ht 68.5 in | Wt 160.0 lb

## 2019-03-26 DIAGNOSIS — Z0000 Encounter for general adult medical examination without abnormal findings: Secondary | ICD-10-CM

## 2019-03-26 NOTE — Patient Instructions (Addendum)
Thank you for taking time to come for your Medicare Wellness Visit. I appreciate your ongoing commitment to your health goals. Please review the following plan we discussed and let me know if I can assist you in the future.  Toddy Boyd LPN  Healthy Eating Following a healthy eating pattern may help you to achieve and maintain a healthy body weight, reduce the risk of chronic disease, and live a long and productive life. It is important to follow a healthy eating pattern at an appropriate calorie level for your body. Your nutritional needs should be met primarily through food by choosing a variety of nutrient-rich foods. What are tips for following this plan? Reading food labels  Read labels and choose the following: ? Reduced or low sodium. ? Juices with 100% fruit juice. ? Foods with low saturated fats and high polyunsaturated and monounsaturated fats. ? Foods with whole grains, such as whole wheat, cracked wheat, brown rice, and wild rice. ? Whole grains that are fortified with folic acid. This is recommended for women who are pregnant or who want to become pregnant.  Read labels and avoid the following: ? Foods with a lot of added sugars. These include foods that contain brown sugar, corn sweetener, corn syrup, dextrose, fructose, glucose, high-fructose corn syrup, honey, invert sugar, lactose, malt syrup, maltose, molasses, raw sugar, sucrose, trehalose, or turbinado sugar.  Do not eat more than the following amounts of added sugar per day:  6 teaspoons (25 g) for women.  9 teaspoons (38 g) for men. ? Foods that contain processed or refined starches and grains. ? Refined grain products, such as white flour, degermed cornmeal, white bread, and white rice. Shopping  Choose nutrient-rich snacks, such as vegetables, whole fruits, and nuts. Avoid high-calorie and high-sugar snacks, such as potato chips, fruit snacks, and candy.  Use oil-based dressings and spreads on foods instead of  solid fats such as butter, stick margarine, or cream cheese.  Limit pre-made sauces, mixes, and "instant" products such as flavored rice, instant noodles, and ready-made pasta.  Try more plant-protein sources, such as tofu, tempeh, black beans, edamame, lentils, nuts, and seeds.  Explore eating plans such as the Mediterranean diet or vegetarian diet. Cooking  Use oil to saut or stir-fry foods instead of solid fats such as butter, stick margarine, or lard.  Try baking, boiling, grilling, or broiling instead of frying.  Remove the fatty part of meats before cooking.  Steam vegetables in water or broth. Meal planning   At meals, imagine dividing your plate into fourths: ? One-half of your plate is fruits and vegetables. ? One-fourth of your plate is whole grains. ? One-fourth of your plate is protein, especially lean meats, poultry, eggs, tofu, beans, or nuts.  Include low-fat dairy as part of your daily diet. Lifestyle  Choose healthy options in all settings, including home, work, school, restaurants, or stores.  Prepare your food safely: ? Wash your hands after handling raw meats. ? Keep food preparation surfaces clean by regularly washing with hot, soapy water. ? Keep raw meats separate from ready-to-eat foods, such as fruits and vegetables. ? Cook seafood, meat, poultry, and eggs to the recommended internal temperature. ? Store foods at safe temperatures. In general:  Keep cold foods at 40F (4.4C) or below.  Keep hot foods at 140F (60C) or above.  Keep your freezer at 0F (-17.8C) or below.  Foods are no longer safe to eat when they have been between the temperatures of 40-140F (4.4-60C) for   for more than 2 hours. What foods should I eat? Fruits Aim to eat 2 cup-equivalents of fresh, canned (in natural juice), or frozen fruits each day. Examples of 1 cup-equivalent of fruit include 1 small apple, 8 large strawberries, 1 cup canned fruit,  cup dried fruit, or 1 cup  100% juice. Vegetables Aim to eat 2-3 cup-equivalents of fresh and frozen vegetables each day, including different varieties and colors. Examples of 1 cup-equivalent of vegetables include 2 medium carrots, 2 cups raw, leafy greens, 1 cup chopped vegetable (raw or cooked), or 1 medium baked potato. Grains Aim to eat 6 ounce-equivalents of whole grains each day. Examples of 1 ounce-equivalent of grains include 1 slice of bread, 1 cup ready-to-eat cereal, 3 cups popcorn, or  cup cooked rice, pasta, or cereal. Meats and other proteins Aim to eat 5-6 ounce-equivalents of protein each day. Examples of 1 ounce-equivalent of protein include 1 egg, 1/2 cup nuts or seeds, or 1 tablespoon (16 g) peanut butter. A cut of meat or fish that is the size of a deck of cards is about 3-4 ounce-equivalents.  Of the protein you eat each week, try to have at least 8 ounces come from seafood. This includes salmon, trout, herring, and anchovies. Dairy Aim to eat 3 cup-equivalents of fat-free or low-fat dairy each day. Examples of 1 cup-equivalent of dairy include 1 cup (240 mL) milk, 8 ounces (250 g) yogurt, 1 ounces (44 g) natural cheese, or 1 cup (240 mL) fortified soy milk. Fats and oils  Aim for about 5 teaspoons (21 g) per day. Choose monounsaturated fats, such as canola and olive oils, avocados, peanut butter, and most nuts, or polyunsaturated fats, such as sunflower, corn, and soybean oils, walnuts, pine nuts, sesame seeds, sunflower seeds, and flaxseed. Beverages  Aim for six 8-oz glasses of water per day. Limit coffee to three to five 8-oz cups per day.  Limit caffeinated beverages that have added calories, such as soda and energy drinks.  Limit alcohol intake to no more than 1 drink a day for nonpregnant women and 2 drinks a day for men. One drink equals 12 oz of beer (355 mL), 5 oz of wine (148 mL), or 1 oz of hard liquor (44 mL). Seasoning and other foods  Avoid adding excess amounts of salt to your  foods. Try flavoring foods with herbs and spices instead of salt.  Avoid adding sugar to foods.  Try using oil-based dressings, sauces, and spreads instead of solid fats. This information is based on general [place] nutrition guidelines. For more information, visit choosemyplate.gov. Exact amounts may vary based on your nutrition needs. Summary  A healthy eating plan may help you to maintain a healthy weight, reduce the risk of chronic diseases, and stay active throughout your life.  Plan your meals. Make sure you eat the right portions of a variety of nutrient-rich foods.  Try baking, boiling, grilling, or broiling instead of frying.  Choose healthy options in all settings, including home, work, school, restaurants, or stores. This information is not intended to replace advice given to you by your health care provider. Make sure you discuss any questions you have with your health care provider. Document Released: 03/18/2018 Document Revised: 03/18/2018 Document Reviewed: 03/18/2018 Elsevier Interactive Patient Education  2019 [employer]  Fall Prevention in the Home, Adult Falls can cause injuries. They can happen to people of all ages. There are many things you can do to make your home safe and to help prevent falls.   help when making these changes, if needed. What actions can I take to prevent falls? General Instructions  Use good lighting in all rooms. Replace any light bulbs that burn out.  Turn on the lights when you go into a dark area. Use night-lights.  Keep items that you use often in easy-to-reach places. Lower the shelves around your home if necessary.  Set up your furniture so you have a clear path. Avoid moving your furniture around.  Do not have throw rugs and other things on the floor that can make you trip.  Avoid walking on wet floors.  If any of your floors are uneven, fix them.  Add color or contrast paint or tape to clearly mark and help you see: ? Any  grab bars or handrails. ? First and last steps of stairways. ? Where the edge of each step is.  If you use a stepladder: ? Make sure that it is fully opened. Do not climb a closed stepladder. ? Make sure that both sides of the stepladder are locked into place. ? Ask someone to hold the stepladder for you while you use it.  If there are any pets around you, be aware of where they are. What can I do in the bathroom?      Keep the floor dry. Clean up any water that spills onto the floor as soon as it happens.  Remove soap buildup in the tub or shower regularly.  Use non-skid mats or decals on the floor of the tub or shower.  Attach bath mats securely with double-sided, non-slip rug tape.  If you need to sit down in the shower, use a plastic, non-slip stool.  Install grab bars by the toilet and in the tub and shower. Do not use towel bars as grab bars. What can I do in the bedroom?  Make sure that you have a light by your bed that is easy to reach.  Do not use any sheets or blankets that are too big for your bed. They should not hang down onto the floor.  Have a firm chair that has side arms. You can use this for support while you get dressed. What can I do in the kitchen?  Clean up any spills right away.  If you need to reach something above you, use a strong step stool that has a grab bar.  Keep electrical cords out of the way.  Do not use floor polish or wax that makes floors slippery. If you must use wax, use non-skid floor wax. What can I do with my stairs?  Do not leave any items on the stairs.  Make sure that you have a light switch at the top of the stairs and the bottom of the stairs. If you do not have them, ask someone to add them for you.  Make sure that there are handrails on both sides of the stairs, and use them. Fix handrails that are broken or loose. Make sure that handrails are as long as the stairways.  Install non-slip stair treads on all stairs in  your home.  Avoid having throw rugs at the top or bottom of the stairs. If you do have throw rugs, attach them to the floor with carpet tape.  Choose a carpet that does not hide the edge of the steps on the stairway.  Check any carpeting to make sure that it is firmly attached to the stairs. Fix any carpet that is loose or worn. What can I  do on the outside of my home?  Use bright outdoor lighting.  Regularly fix the edges of walkways and driveways and fix any cracks.  Remove anything that might make you trip as you walk through a door, such as a raised step or threshold.  Trim any bushes or trees on the path to your home.  Regularly check to see if handrails are loose or broken. Make sure that both sides of any steps have handrails.  Install guardrails along the edges of any raised decks and porches.  Clear walking paths of anything that might make someone trip, such as tools or rocks.  Have any leaves, snow, or ice cleared regularly.  Use sand or salt on walking paths during winter.  Clean up any spills in your garage right away. This includes grease or oil spills. What other actions can I take?  Wear shoes that: ? Have a low heel. Do not wear high heels. ? Have rubber bottoms. ? Are comfortable and fit you well. ? Are closed at the toe. Do not wear open-toe sandals.  Use tools that help you move around (mobility aids) if they are needed. These include: ? Canes. ? Walkers. ? Scooters. ? Crutches.  Review your medicines with your doctor. Some medicines can make you feel dizzy. This can increase your chance of falling. Ask your doctor what other things you can do to help prevent falls. Where to find more information  Centers for Disease Control and Prevention, STEADI: https://garcia.biz/  [employer] on Aging: BrainJudge.co.uk Contact a doctor if:  You are afraid of falling at home.  You feel weak, drowsy, or dizzy at home.  You fall at home.  Summary  There are many simple things that you can do to make your home safe and to help prevent falls.  Ways to make your home safe include removing tripping hazards and installing grab bars in the bathroom.  Ask for help when making these changes in your home. This information is not intended to replace advice given to you by your health care provider. Make sure you discuss any questions you have with your health care provider. Document Released: 09/30/2009 Document Revised: 07/19/2017 Document Reviewed: 07/19/2017 Elsevier Interactive Patient Education  2019 [employer].

## 2019-03-26 NOTE — Progress Notes (Addendum)
Presents today for [employer] Visit    Interpreter used for this visit? no  Patient Care Team: Forrest Moron, MD as PCP - General (Internal Medicine)     ADVANCE DIRECTIVES: Discussed: yes On File: yes   Immunization status:  Immunization History  Administered Date(s) Administered  . Influenza, High Dose Seasonal PF 10/01/2017  . Influenza,inj,Quad PF,6+ Mos 01/17/2017  . Influenza-Unspecified 10/07/2015, 08/21/2018  . Pneumococcal Conjugate-13 01/17/2017  . Pneumococcal Polysaccharide-23 04/19/2018     There are no preventive care reminders to display for this patient.   Functional Status Survey:     6CIT Screen 03/26/2019 01/24/2018  What Year? 0 points 0 points  What month? 0 points 0 points  What time? 0 points 0 points  Count back from 20 0 points 0 points  Months in reverse 0 points 0 points  Repeat phrase 0 points 2 points  Total Score 0 2        Clinical Support from 03/26/2019 in Primary Care at [facility]  AUDIT-C Score  2         Patient Active Problem List   Diagnosis Date Noted  . Pure hypercholesterolemia 01/17/2017  . Thyroid nodule 08/20/2013  . HTN (hypertension) 08/20/2013  . Body mass index (BMI) of 24.0-24.9 in adult 08/20/2013  . Gastric ulcer 12/29/2011  . Renal insufficiency 12/29/2011  . History of bladder cancer, s/p cystectomy with ileal conduit      Past Medical History:  Diagnosis Date  . [facility])    bladder, s/p cystectomy with ileal conduit  . History of bladder cancer   . History of tobacco use   . Hyperlipidemia   . Hypertension   . Incarcerated ventral hernia, s/p lap repair 12/05/2011  . Parastomal hernia of ileal conduit, s/p lap repair [id number] 12/04/2011  . Ulcer, gastric, acute or chronic      Past Surgical History:  Procedure Laterality Date  . BLADDER REMOVAL  10/1993   Dr. Risa Grill  . HERNIA REPAIR  12/05/11   parastomal  . PARASTOMAL HERNIA REPAIR  12/05/2011   Procedure:  HERNIA REPAIR PARASTOMAL;  Surgeon: Adin Hector, MD;  Location: [facility];  Service: General;;  Laprascopic lysis of adhestons with reduction and repair with biological mesh for incarcerated parastomal and ventral long incisional hernia.  Marland Kitchen REVISION UROSTOMY CUTANEOUS    . VENTRAL HERNIA REPAIR  12/05/2011   Procedure: HERNIA REPAIR VENTRAL ADULT;  Surgeon: Adin Hector, MD;  Location: [facility];  Service: General;;     Family History  Problem Relation Age of Onset  . Heart disease Mother 36       CHF  . Alcohol abuse Father   . Stroke Father      Social History   Socioeconomic History  . Marital status: Widowed    Spouse name: Not on file  . Number of children: 2  . Years of education: Not on file  . Highest education level: Some college, no degree  Occupational History  . Occupation: retired  Scientific laboratory technician  . Financial resource strain: Not hard at all  . Food insecurity:    Worry: Never true    Inability: Never true  . Transportation needs:    Medical: No    Non-medical: No  Tobacco Use  . Smoking status: Former Smoker    Last attempt to quit: 01/02/1993    Years since quitting: 26.2  . Smokeless tobacco: Never Used  Substance and Sexual Activity  .  Alcohol use: No  . Drug use: No  . Sexual activity: Not on file  Lifestyle  . Physical activity:    Days per week: 0 days    Minutes per session: 0 min  . Stress: Only a little  Relationships  . Social connections:    Talks on phone: More than three times a week    Gets together: More than three times a week    Attends religious service: 1 to 4 times per year    Active member of club or organization: No    Attends meetings of clubs or organizations: Never    Relationship status: Widowed  . Intimate partner violence:    Fear of current or ex partner: No    Emotionally abused: No    Physically abused: No    Forced sexual activity: No  Other Topics Concern  . 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  Gardening      ADLs: drives; independent with ADLs; has garden      Advanced Directives: YES: full code but no prolonged measures; HCPOA: Orvis Brill or other son Cory Munch; copy on chart 04/2017.     Allergies  Allergen Reactions  . Imodium [Loperamide]     Itchy all over  . Loperamide Hcl Rash     Prior to Admission medications   Medication Sig Start Date End Date Taking? Authorizing Provider  amLODipine (NORVASC) 5 MG tablet TAKE 1 TABLET BY MOUTH EVERY DAY 03/23/19  Yes Stallings, Zoe A, MD  hydrochlorothiazide (HYDRODIURIL) 25 MG tablet TAKE 1 TABLET BY MOUTH EVERY DAY 02/17/19  Yes Delia Chimes A, MD  Multiple Vitamin (MULTIVITAMIN) tablet Take 1 tablet by mouth daily.   Yes [provider]  Karen Kays TO [place] [id number] 10/31/18  Yes Forrest Moron, MD     Depression screen [facility] 2/9 03/26/2019 12/25/2018 09/18/2018 08/13/2018 01/24/2018  Decreased Interest 0 0 0 0 0  Down, Depressed, Hopeless 0 0 0 0 0  PHQ - 2 Score 0 0 0 0 0     Fall Risk  03/26/2019 12/25/2018 09/18/2018 08/13/2018 01/24/2018  Falls in the past year? 0 0 No Yes Yes  Number falls in past yr: 0 - - 1 1  Injury with Fall? 0 - - (No Data) No  Comment - - - bumps and bruising scratched up knee, tripped and fell  Risk for fall due to : - - - - -  Risk for fall due to: Comment - - - - -  Follow up Falls evaluation completed;Education provided;Falls prevention discussed - - - -      PHYSICAL EXAM: Wt 160 lb (72.6 kg)   BMI 24.33 kg/m    Wt Readings from Last 3 Encounters:  03/26/19 160 lb (72.6 kg)  12/25/18 160 lb 6.4 oz (72.8 kg)   09/18/18 154 lb 3.2 oz (69.9 kg)     No exam data present    Physical Exam   Education/Counseling provided regarding diet and  exercise, prevention of chronic diseases, smoking/tobacco cessation, if applicable, and reviewed "Covered Medicare Preventive Services."   ASSESSMENT/PLAN: 1. Medicare annual wellness visit, subsequent    Review of Systems:  N/A Cardiac Risk Factors include: advanced age (>56men, >40 women);dyslipidemia;hypertension  Exercise Activities and Dietary recommendations Current Exercise Habits: The patient does not participate in regular exercise at present (stays active in the garden ),   Qualifies for Shingles Vaccine? Patient declined Shingrix vaccine   Breast:  Up to date on Mammogram? No, stops at age 57   Up to date of Bone Density/Dexa? No, Patient declined bone density  Colorectal: completed 12/02/2010  Recommended yearly ophthalmology/optometry visit for glaucoma screening and checkup Recommended yearly dental visit for hygiene and checkup.  Lives in one story widowed  No trouble climbing stairs No Grab bars  A few scattered rugs (have grip downs)

## 2019-03-27 ENCOUNTER — Ambulatory Visit: Payer: PPO | Admitting: Family Medicine

## 2019-06-23 ENCOUNTER — Other Ambulatory Visit: Payer: Self-pay | Admitting: Family Medicine

## 2019-06-23 DIAGNOSIS — I10 Essential (primary) hypertension: Secondary | ICD-10-CM

## 2019-06-23 NOTE — Telephone Encounter (Signed)
Requested Prescriptions  Pending Prescriptions Disp Refills  . amLODipine (NORVASC) 5 MG tablet [Pharmacy Med Name: AMLODIPINE BESYLATE 5 MG TAB] 90 tablet 0    Sig: TAKE 1 TABLET BY MOUTH EVERY DAY     Cardiovascular:  Calcium Channel Blockers Failed - 06/23/2019  1:34 AM      Failed - Last BP in normal range    BP Readings from Last 1 Encounters:  12/25/18 (!) 160/80         Passed - Valid encounter within last 6 months    Recent Outpatient Visits          2 months ago Medicare annual wellness visit, subsequent   Primary Care at [facility], Lilia Argue, MD   6 months ago Uncontrolled hypertension   Primary Care at [facility], Arlie Solomons, MD   9 months ago Uncontrolled hypertension   Primary Care at [facility], Arlie Solomons, MD   10 months ago Uncontrolled hypertension   Primary Care at [facility], Arlie Solomons, MD   1 year ago Uncontrolled hypertension   Primary Care at [facility], Arlie Solomons, MD

## 2019-07-18 ENCOUNTER — Telehealth: Payer: Self-pay | Admitting: *Deleted

## 2019-07-18 DIAGNOSIS — R809 Proteinuria, unspecified: Secondary | ICD-10-CM

## 2019-07-18 DIAGNOSIS — N289 Disorder of kidney and ureter, unspecified: Secondary | ICD-10-CM

## 2019-07-18 NOTE — Telephone Encounter (Signed)
Calling Report: Soni, NP  [facility]  Abnormal finding- PAD- screening- significant reading on both legs, urine- protein +2

## 2019-07-18 NOTE — Telephone Encounter (Signed)
Just an FYI, Dr. Nolon Rod.  Sara Aguirre is a NP who does annual preventative visits with [facility].  She will be faxing over abnormal protein and PAD screenings.  I will have scheduling reach out to Sara Aguirre for a general f/u as we haven't seen her in house since January.

## 2019-07-31 DIAGNOSIS — Z936 Other artificial openings of urinary tract status: Secondary | ICD-10-CM | POA: Diagnosis not present

## 2019-08-08 NOTE — Addendum Note (Signed)
Addended by: Delia Chimes A on: 08/08/2019 04:47 PM   Modules accepted: Orders

## 2019-08-08 NOTE — Telephone Encounter (Signed)
Please notify the patient that based on the screenings of the urine proteins, the kidney function being abnormal then I would refer her to Nephrology.   Lab Results  Component Value Date   CREATININE 1.57 (H) 12/25/2018

## 2019-08-12 ENCOUNTER — Telehealth: Payer: Self-pay | Admitting: Family Medicine

## 2019-08-12 ENCOUNTER — Other Ambulatory Visit: Payer: Self-pay | Admitting: Family Medicine

## 2019-08-12 NOTE — Telephone Encounter (Signed)
Requested medication (s) are due for refill today: yes  Requested medication (s) are on the active medication list: yes  Last refill:  02/17/2019  Future visit scheduled: no  Notes to clinic: per protocol unable to refill   Requested Prescriptions  Pending Prescriptions Disp Refills   hydrochlorothiazide (HYDRODIURIL) 25 MG tablet [Pharmacy Med Name: HYDROCHLOROTHIAZIDE 25 MG TAB] 90 tablet 1    Sig: TAKE 1 TABLET BY MOUTH EVERY DAY     Cardiovascular: Diuretics - Thiazide Failed - 08/12/2019  1:47 AM      Failed - Cr in normal range and within 360 days    Creat  Date Value Ref Range Status  09/29/2015 1.46 (H) 0.60 - 0.93 mg/dL Final   Creatinine, Ser  Date Value Ref Range Status  12/25/2018 1.57 (H) 0.57 - 1.00 mg/dL Final         Failed - Last BP in normal range    BP Readings from Last 1 Encounters:  12/25/18 (!) 160/80         Passed - Ca in normal range and within 360 days    Calcium  Date Value Ref Range Status  12/25/2018 9.9 8.7 - 10.3 mg/dL Final         Passed - K in normal range and within 360 days    Potassium  Date Value Ref Range Status  12/25/2018 4.2 3.5 - 5.2 mmol/L Final    Comment:    Specimen received hemolyzed. Clinical correlation indicated.         Passed - Na in normal range and within 360 days    Sodium  Date Value Ref Range Status  12/25/2018 140 134 - 144 mmol/L Final         Passed - Valid encounter within last 6 months    Recent Outpatient Visits          4 months ago Medicare annual wellness visit, subsequent   Primary Care at [facility], Lilia Argue, MD   7 months ago Uncontrolled hypertension   Primary Care at [facility] Arlie Solomons, MD   10 months ago Uncontrolled hypertension   Primary Care at [facility], Arlie Solomons, MD   12 months ago Uncontrolled hypertension   Primary Care at [facility], Arlie Solomons, MD   1 year ago Uncontrolled hypertension   Primary Care at [facility], Arlie Solomons, MD

## 2019-08-12 NOTE — Telephone Encounter (Signed)
Called pt and spoke with her about her concerns, she states she does not want to go for the referral. I verbalized understanding and informed her to give [place] a call back at the office.

## 2019-08-12 NOTE — Telephone Encounter (Signed)
Informed pt that referral has been sent.

## 2019-08-12 NOTE — Telephone Encounter (Signed)
Spoke with pt and she verbalized understanding.

## 2019-08-12 NOTE — Telephone Encounter (Signed)
Patient CB to discuss the referral, said she doesn't want it and is not going  Wants to talk to Dr. Nolon Rod nurse that called her

## 2019-08-13 ENCOUNTER — Telehealth: Payer: Self-pay | Admitting: Family Medicine

## 2019-08-13 DIAGNOSIS — I739 Peripheral vascular disease, unspecified: Secondary | ICD-10-CM

## 2019-08-13 NOTE — Telephone Encounter (Signed)
Please let the patient know that the [facility] program screened and found peripheral artery disease that was significant in both legs. To decrease the risk of future amputations I would recommend that she sees the vascular surgeon for evaluation of the blood vessels in the legs. I have placed a referral

## 2019-08-22 ENCOUNTER — Encounter: Payer: Self-pay | Admitting: Family Medicine

## 2019-09-05 ENCOUNTER — Other Ambulatory Visit: Payer: Self-pay | Admitting: Family Medicine

## 2019-09-05 NOTE — Telephone Encounter (Signed)
Requested medication (s) are due for refill today: yes  Requested medication (s) are on the active medication list: yes  Last refill:  08/13/2019  Future visit scheduled: no  Notes to clinic:  Review for refill   Requested Prescriptions  Pending Prescriptions Disp Refills   hydrochlorothiazide (HYDRODIURIL) 25 MG tablet [Pharmacy Med Name: HYDROCHLOROTHIAZIDE 25 MG TAB] 30 tablet 0    Sig: TAKE 1 TABLET BY MOUTH EVERY DAY     Cardiovascular: Diuretics - Thiazide Failed - 09/05/2019  2:31 PM      Failed - Cr in normal range and within 360 days    Creat  Date Value Ref Range Status  09/29/2015 1.46 (H) 0.60 - 0.93 mg/dL Final   Creatinine, Ser  Date Value Ref Range Status  12/25/2018 1.57 (H) 0.57 - 1.00 mg/dL Final         Failed - Last BP in normal range    BP Readings from Last 1 Encounters:  12/25/18 (!) 160/80         Passed - Ca in normal range and within 360 days    Calcium  Date Value Ref Range Status  12/25/2018 9.9 8.7 - 10.3 mg/dL Final         Passed - K in normal range and within 360 days    Potassium  Date Value Ref Range Status  12/25/2018 4.2 3.5 - 5.2 mmol/L Final    Comment:    Specimen received hemolyzed. Clinical correlation indicated.         Passed - Na in normal range and within 360 days    Sodium  Date Value Ref Range Status  12/25/2018 140 134 - 144 mmol/L Final         Passed - Valid encounter within last 6 months    Recent Outpatient Visits          5 months ago Medicare annual wellness visit, subsequent   Primary Care at [facility], Lilia Argue, MD   8 months ago Uncontrolled hypertension   Primary Care at [facility], Arlie Solomons, MD   11 months ago Uncontrolled hypertension   Primary Care at [facility], Arlie Solomons, MD   1 year ago Uncontrolled hypertension   Primary Care at [facility], Arlie Solomons, MD   1 year ago Uncontrolled hypertension   Primary Care at [facility], Arlie Solomons, MD

## 2019-09-15 ENCOUNTER — Other Ambulatory Visit: Payer: Self-pay | Admitting: Family Medicine

## 2019-09-15 DIAGNOSIS — I10 Essential (primary) hypertension: Secondary | ICD-10-CM

## 2019-09-15 NOTE — Telephone Encounter (Signed)
FYI your patient.

## 2019-09-15 NOTE — Telephone Encounter (Signed)
Forwarding medication refill request to the clinical pool for review. 

## 2019-09-30 ENCOUNTER — Encounter (HOSPITAL_COMMUNITY): Payer: PPO

## 2019-09-30 ENCOUNTER — Encounter: Payer: PPO | Admitting: Vascular Surgery

## 2019-10-10 ENCOUNTER — Other Ambulatory Visit: Payer: Self-pay | Admitting: Family Medicine

## 2019-10-10 DIAGNOSIS — I10 Essential (primary) hypertension: Secondary | ICD-10-CM

## 2019-10-10 NOTE — Telephone Encounter (Signed)
Requested medication (s) are due for refill today: yes  Requested medication (s) are on the active medication list: yes  Last refill:  09/15/2019  Future visit scheduled: no  Notes to clinic:  Review for refill Overdue for office visit   Requested Prescriptions  Pending Prescriptions Disp Refills   amLODipine (NORVASC) 5 MG tablet [Pharmacy Med Name: AMLODIPINE BESYLATE 5 MG TAB] 30 tablet 0    Sig: TAKE 1 TABLET BY MOUTH EVERY DAY     Cardiovascular:  Calcium Channel Blockers Failed - 10/10/2019  8:30 AM      Failed - Last BP in normal range    BP Readings from Last 1 Encounters:  12/25/18 (!) 160/80         Failed - Valid encounter within last 6 months    Recent Outpatient Visits          6 months ago Medicare annual wellness visit, subsequent   Primary Care at [facility], Lilia Argue, MD   9 months ago Uncontrolled hypertension   Primary Care at [facility], Arlie Solomons, MD   1 year ago Uncontrolled hypertension   Primary Care at [facility], Arlie Solomons, MD   1 year ago Uncontrolled hypertension   Primary Care at [facility], Arlie Solomons, MD   1 year ago Uncontrolled hypertension   Primary Care at [facility], Arlie Solomons, MD

## 2019-10-10 NOTE — Telephone Encounter (Signed)
LVMTCB

## 2019-10-10 NOTE — Telephone Encounter (Signed)
Refilled medication for 30 day, no refills. Patient needs OV for labs and follow up for htn for additional refills.

## 2019-10-13 ENCOUNTER — Other Ambulatory Visit: Payer: Self-pay | Admitting: Family Medicine

## 2019-10-13 NOTE — Telephone Encounter (Signed)
Called back and scheduled appt for October 27, 2019

## 2019-10-13 NOTE — Telephone Encounter (Signed)
Requested medication (s) are due for refill today:   Yes  Requested medication (s) are on the active medication list:   Yes  Future visit scheduled:   Yes  10/24/2019 with Nolon Rod   Last ordered: He has had his 30 day courtesy supply.   Returning for provider decision whether to refill or not before upcoming appt.   Requested Prescriptions  Pending Prescriptions Disp Refills   hydrochlorothiazide (HYDRODIURIL) 25 MG tablet [Pharmacy Med Name: HYDROCHLOROTHIAZIDE 25 MG TAB] 30 tablet 0    Sig: TAKE 1 TABLET BY MOUTH EVERY DAY     Cardiovascular: Diuretics - Thiazide Failed - 10/13/2019  1:32 PM      Failed - Cr in normal range and within 360 days    Creat  Date Value Ref Range Status  09/29/2015 1.46 (H) 0.60 - 0.93 mg/dL Final   Creatinine, Ser  Date Value Ref Range Status  12/25/2018 1.57 (H) 0.57 - 1.00 mg/dL Final         Failed - Last BP in normal range    BP Readings from Last 1 Encounters:  12/25/18 (!) 160/80         Failed - Valid encounter within last 6 months    Recent Outpatient Visits          6 months ago Medicare annual wellness visit, subsequent   Primary Care at [facility], Lilia Argue, MD   9 months ago Uncontrolled hypertension   Primary Care at [facility], Arlie Solomons, MD   1 year ago Uncontrolled hypertension   Primary Care at [facility], Arlie Solomons, MD   1 year ago Uncontrolled hypertension   Primary Care at [facility] [place] A, MD   1 year ago Uncontrolled hypertension   Primary Care at [place], MD      Future Appointments            In 2 weeks Forrest Moron, MD Primary Care at [place] [place] in normal range and within 360 days    Calcium  Date Value Ref Range Status  12/25/2018 9.9 8.7 - 10.3 mg/dL Final         Passed - K in normal range and within 360 days    Potassium  Date Value Ref Range Status  12/25/2018 4.2 3.5 - 5.2 mmol/L Final    Comment:    Specimen received hemolyzed.  Clinical correlation indicated.         Passed - Na in normal range and within 360 days    Sodium  Date Value Ref Range Status  12/25/2018 140 134 - 144 mmol/L Final

## 2019-10-27 ENCOUNTER — Encounter: Payer: Self-pay | Admitting: Family Medicine

## 2019-10-27 ENCOUNTER — Other Ambulatory Visit: Payer: Self-pay

## 2019-10-27 ENCOUNTER — Ambulatory Visit (INDEPENDENT_AMBULATORY_CARE_PROVIDER_SITE_OTHER): Payer: PPO | Admitting: Family Medicine

## 2019-10-27 VITALS — BP 182/88 | HR 95 | Temp 98.4°F | Ht 67.5 in | Wt 163.0 lb

## 2019-10-27 DIAGNOSIS — I10 Essential (primary) hypertension: Secondary | ICD-10-CM

## 2019-10-27 DIAGNOSIS — E7800 Pure hypercholesterolemia, unspecified: Secondary | ICD-10-CM | POA: Diagnosis not present

## 2019-10-27 DIAGNOSIS — E782 Mixed hyperlipidemia: Secondary | ICD-10-CM | POA: Diagnosis not present

## 2019-10-27 MED ORDER — AMLODIPINE BESYLATE 5 MG PO TABS: 5.0000 mg | ORAL_TABLET | Freq: Every day | ORAL | 1 refills | Status: DC

## 2019-10-27 MED ORDER — BLOOD PRESSURE MONITOR DEVI: 0 refills | Status: DC

## 2019-10-27 MED ORDER — HYDROCHLOROTHIAZIDE 25 MG PO TABS: 25.0000 mg | ORAL_TABLET | Freq: Every day | ORAL | 1 refills | Status: DC

## 2019-10-27 NOTE — Progress Notes (Signed)
Established Patient Office Visit  Subjective:  Patient ID: Sara Aguirre, female    DOB: 03/11/1941  Age: 78 y.o. MRN: [id number]  CC:  Chief Complaint  Patient presents with  . Hypertension  . Medication Refill    HPI [name] presents for    Hypertension: Patient here for follow-up of elevated blood pressure. She is exercising and is adherent to low salt diet.  Blood pressure is well controlled at home. Cardiac symptoms none. Patient denies chest pain, chest pressure/discomfort, dyspnea, fatigue, irregular heart beat, lower extremity edema and near-syncope.  Cardiovascular risk factors: hypertension. Use of agents associated with hypertension: none. History of target organ damage: none. BP Readings from Last 3 Encounters:  10/27/19 (!) 182/88  12/25/18 (!) 160/80  09/18/18 (!) 162/80   She exercises by gardening and has several large beds and greenhouses in her home She eats what she grows She livers a very healthy lifestyle  She ran out of her bp medications When the insurance company sent a home nurse her bp reading was 140/80  Past Medical History:  Diagnosis Date  . Cancer [facility])    bladder, s/p cystectomy with ileal conduit  . History of bladder cancer   . History of tobacco use   . Hyperlipidemia   . Hypertension   . Incarcerated ventral hernia, s/p lap repair 12/05/2011  . 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 Ulcer, gastric, acute or chronic     Past Surgical History:  Procedure Laterality Date  . BLADDER REMOVAL  10/1993   Dr. Risa Grill  . HERNIA REPAIR  12/05/11   parastomal  . PARASTOMAL HERNIA REPAIR  12/05/2011   Procedure: HERNIA REPAIR PARASTOMAL;  Surgeon: Adin Hector, MD;  Location: [facility];  Service: General;;  Laprascopic lysis of adhestons with reduction and repair with biological mesh for incarcerated parastomal and ventral long incisional hernia.  Marland Kitchen REVISION UROSTOMY CUTANEOUS    . VENTRAL HERNIA  REPAIR  12/05/2011   Procedure: HERNIA REPAIR VENTRAL ADULT;  Surgeon: Adin Hector, MD;  Location: [facility];  Service: General;;    Family History  Problem Relation Age of Onset  . Heart disease Mother 46       CHF  . Alcohol abuse Father   . Stroke Father     Social History   Socioeconomic History  . Marital status: Widowed    Spouse name: Not on file  . Number of children: 2  . Years of education: Not on file  . Highest education level: Some college, no degree  Occupational History  . Occupation: retired  Scientific laboratory technician  . Financial resource strain: Not hard at all  . Food insecurity    Worry: Never true    Inability: Never true  . Transportation needs    Medical: No    Non-medical: No  Tobacco Use  . Smoking status: Former Smoker    Quit date: 01/02/1993    Years since quitting: 26.8  . Smokeless tobacco: Never Used  Substance and Sexual Activity  . Alcohol use: No  . Drug use: No  . Sexual activity: Not on file  Lifestyle  . Physical activity    Days per week: 0 days    Minutes per session: 0 min  . Stress: Only a little  Relationships  . Social connections    Talks on phone: More than three times a week    Gets together: More than three times a week  Attends religious service: 1 to 4 times per year    Active member of club or organization: No    Attends meetings of clubs or organizations: Never    Relationship status: Widowed  . Intimate partner violence    Fear of current or ex partner: No    Emotionally abused: No    Physically abused: No    Forced sexual activity: No  Other Topics Concern  . 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  Gardening      ADLs: drives; independent with ADLs; has garden      Advanced Directives: YES: full code but no prolonged measures; HCPOA: Orvis Brill or other son Cory Munch; copy on chart 04/2017.    Outpatient Medications Prior to Visit  Medication Sig Dispense Refill  . Multiple Vitamin (MULTIVITAMIN) tablet Take 1 tablet by mouth daily.    Marland Kitchen UNABLE TO FIND HOLISTER UROSTOMY POUCHES ITEM [phone] Device 3  . amLODipine (NORVASC) 5 MG tablet TAKE 1 TABLET BY MOUTH EVERY DAY 30 tablet 0  . hydrochlorothiazide (HYDRODIURIL) 25 MG tablet TAKE 1 TABLET BY MOUTH EVERY DAY 30 tablet 0   No facility-administered medications prior to visit.     Allergies  Allergen Reactions  . Imodium [Loperamide]     Itchy all over  . Loperamide Hcl Rash    ROS Review of Systems Review of Systems  Constitutional: Negative for activity change, appetite change, chills and fever.  HENT: Negative for congestion, nosebleeds, trouble swallowing and voice change.   Respiratory: Negative for cough, shortness of breath and wheezing.   Gastrointestinal: Negative for diarrhea, nausea and vomiting.  Genitourinary: Negative for difficulty urinating, dysuria, flank pain and hematuria.  Musculoskeletal: Negative for back pain, joint swelling and neck pain.  Neurological: Negative for dizziness, speech difficulty, light-headedness and numbness.  See HPI. All other review of systems negative.     Objective:    Physical Exam  BP (!) 182/88 (BP Location: Left Arm, Patient Position: Sitting, Cuff Size: Normal)   Pulse 95   Temp 98.4 F (36.9 C)   Ht 5' 7.5" (1.715 m)   Wt 163 lb (73.9 kg)   SpO2 95%   BMI 25.15 kg/m  Wt Readings from Last 3 Encounters:  10/27/19 163 lb (73.9 kg)  03/26/19 160 lb (72.6 kg)  12/25/18 160 lb 6.4 oz (72.8 kg)   Physical Exam  Constitutional: Oriented to person, place, and time. Appears well-developed and well-nourished.  HENT:   Head: Normocephalic and atraumatic.  Eyes: Conjunctivae and EOM are normal.  Cardiovascular: Normal rate, regular rhythm, normal heart sounds and intact distal pulses.  No murmur heard. Pulmonary/Chest: Effort normal and breath sounds normal. No stridor. No respiratory distress. Has no wheezes.  Neurological: Is alert and oriented to person, place, and time.  Skin: Skin is warm. Capillary refill takes less than 2 seconds.  Psychiatric: Has a normal mood and affect. Behavior is normal. Judgment and thought content normal.    There are no preventive care reminders  to display for this patient.  There are no preventive care reminders to display for this patient.  Lab Results  Component Value Date   TSH 0.880 08/13/2018   Lab Results  Component Value Date   WBC 8.9 01/17/2017   HGB 13.6 01/17/2017   HCT 41.0 01/17/2017   MCV 87 01/17/2017   PLT 297 01/17/2017   Lab Results  Component Value Date   NA 140 12/25/2018   K 4.2 12/25/2018   CO2 24 12/25/2018   GLUCOSE 90 12/25/2018   BUN 28 (H) 12/25/2018   CREATININE 1.57 (H) 12/25/2018   BILITOT 0.4 12/25/2018   ALKPHOS 68 12/25/2018   AST 23 12/25/2018   ALT 9 12/25/2018   PROT 7.3 12/25/2018   ALBUMIN 4.3 12/25/2018   CALCIUM 9.9 12/25/2018   Lab Results  Component Value Date   CHOL 280 (H) 12/25/2018   Lab Results  Component Value Date   HDL 78 12/25/2018   Lab Results  Component Value Date   LDLCALC 164 (H) 12/25/2018   Lab Results  Component Value Date   TRIG 188 (H) 12/25/2018   Lab Results  Component Value Date   CHOLHDL 3.6 12/25/2018   No results found for: HGBA1C    Assessment & Plan:   Problem List Items Addressed This Visit      Other   Pure hypercholesterolemia - Primary Discussed options for treatment   Relevant Medications   amLODipine (NORVASC) 5 MG tablet   hydrochlorothiazide (HYDRODIURIL) 25 MG tablet   Other Relevant Orders   Lipid panel   CMP14+EGFR    Other Visit Diagnoses     Uncontrolled hypertension     Refilled medications Discussed that her white coat hypertension makes it difficult to manage her bp But a home health nurse checking is very reassuring Will continue the current dose based on her home readings   Relevant Medications   amLODipine (NORVASC) 5 MG tablet   hydrochlorothiazide (HYDRODIURIL) 25 MG tablet   Other Relevant Orders   Lipid panel   CMP14+EGFR      Meds ordered this encounter  Medications  . amLODipine (NORVASC) 5 MG tablet    Sig: Take 1 tablet (5 mg total) by mouth daily.    Dispense:  90 tablet    Refill:  1  . hydrochlorothiazide (HYDRODIURIL) 25 MG tablet    Sig: Take 1 tablet (25 mg total) by mouth daily.    Dispense:  90 tablet    Refill:  1    Patient needs appointment    Follow-up: No follow-ups on file.    Forrest Moron, MD

## 2019-10-27 NOTE — Patient Instructions (Addendum)
     If you have lab work done today you will be contacted with your lab results within the next 2 weeks.  If you have not heard from [place] then please contact us. The fastest way to get your results is to register for My Chart.   IF you received an x-ray today, you will receive an invoice from [facility]. Please contact [facility] at [phone] with questions or concerns regarding your invoice.   IF you received labwork today, you will receive an invoice from [facility]. Please contact [facility] at [phone] with questions or concerns regarding your invoice.   Our billing staff will not be able to assist you with questions regarding bills from these companies.  You will be contacted with the lab results as soon as they are available. The fastest way to get your results is to activate your My Chart account. Instructions are located on the last page of this paperwork. If you have not heard from [place] regarding the results in 2 weeks, please contact this office.      Blood Pressure Record Sheet To take your blood pressure, you will need a blood pressure machine. You can buy a blood pressure machine (blood pressure monitor) at your clinic, drug store, or online. When choosing one, consider:  An automatic monitor that has an arm cuff.  A cuff that wraps snugly around your upper arm. You should be able to fit only one finger between your arm and the cuff.  A device that stores blood pressure reading results.  Do not choose a monitor that measures your blood pressure from your wrist or finger. Follow your health care provider's instructions for how to take your blood pressure. To use this form:  Get one reading in the morning (a.m.) before you take any medicines.  Get one reading in the evening (p.m.) before supper.  Take at least 2 readings with each blood pressure check. This makes sure the results are correct. Wait 1-2 minutes between measurements.  Write down the  results in the spaces on this form.  Repeat this once a week, or as told by your health care provider.  Make a follow-up appointment with your health care provider to discuss the results. Blood pressure log Date: _______________________  a.m. _____________________(1st reading) _____________________(2nd reading)  p.m. _____________________(1st reading) _____________________(2nd reading) Date: _______________________  a.m. _____________________(1st reading) _____________________(2nd reading)  p.m. _____________________(1st reading) _____________________(2nd reading) Date: _______________________  a.m. _____________________(1st reading) _____________________(2nd reading)  p.m. _____________________(1st reading) _____________________(2nd reading) Date: _______________________  a.m. _____________________(1st reading) _____________________(2nd reading)  p.m. _____________________(1st reading) _____________________(2nd reading) Date: _______________________  a.m. _____________________(1st reading) _____________________(2nd reading)  p.m. _____________________(1st reading) _____________________(2nd reading) This information is not intended to replace advice given to you by your health care provider. Make sure you discuss any questions you have with your health care provider. Document Released: 09/02/2003 Document Revised: 02/01/2018 Document Reviewed: 12/04/2017 Elsevier Patient Education  2020 [employer].

## 2019-10-28 LAB — LIPID PANEL
Chol/HDL Ratio: 4.5 ratio — ABNORMAL HIGH (ref 0.0–4.4)
Cholesterol, Total: 278 mg/dL — ABNORMAL HIGH (ref 100–199)
HDL: 62 mg/dL (ref >39)
LDL Chol Calc (NIH): 158 mg/dL — ABNORMAL HIGH (ref 0–99)
Triglycerides: 310 mg/dL — ABNORMAL HIGH (ref 0–149)
VLDL Cholesterol Cal: 58 mg/dL — ABNORMAL HIGH (ref 5–40)

## 2019-10-28 LAB — CMP14+EGFR
ALT: 11 IU/L (ref 0–32)
AST: 19 IU/L (ref 0–40)
Albumin/Globulin Ratio: 1.5 (ref 1.2–2.2)
Albumin: 4.3 g/dL (ref 3.7–4.7)
Alkaline Phosphatase: 88 IU/L (ref 39–117)
BUN/Creatinine Ratio: 14 (ref 12–28)
BUN: 19 mg/dL (ref 8–27)
Bilirubin Total: 0.3 mg/dL (ref 0.0–1.2)
CO2: 29 mmol/L (ref 20–29)
Calcium: 10.4 mg/dL — ABNORMAL HIGH (ref 8.7–10.3)
Chloride: 96 mmol/L (ref 96–106)
Creatinine, Ser: 1.36 mg/dL — ABNORMAL HIGH (ref 0.57–1.00)
GFR calc Af Amer: 42 mL/min/{1.73_m2} — ABNORMAL LOW (ref >59)
GFR calc non Af Amer: 37 mL/min/{1.73_m2} — ABNORMAL LOW (ref >59)
Globulin, Total: 2.9 g/dL (ref 1.5–4.5)
Glucose: 100 mg/dL — ABNORMAL HIGH (ref 65–99)
Potassium: 3.8 mmol/L (ref 3.5–5.2)
Sodium: 144 mmol/L (ref 134–144)
Total Protein: 7.2 g/dL (ref 6.0–8.5)

## 2019-10-28 MED ORDER — ATORVASTATIN CALCIUM 20 MG PO TABS: 20.0000 mg | ORAL_TABLET | ORAL | 1 refills | Status: DC

## 2020-01-27 ENCOUNTER — Telehealth: Payer: Self-pay | Admitting: Family Medicine

## 2020-01-27 NOTE — Telephone Encounter (Signed)
Pt is needing paperwork authorizing her to get ostomy supplies. The request is from [facility] [employer]-[address]  [id number]  She stated Dr. Nolon Rod did this for her a year ago. This has to be done once a year. Please advise pt at [phone] ([phone]). She is running very low.

## 2020-01-27 NOTE — Telephone Encounter (Signed)
Left a msg on patient's machine. We can authorize for her to get ostomy supplies. Have the company send [place] a form like they done in the past and we will be happy to fill out and fax back to them.

## 2020-02-04 ENCOUNTER — Telehealth: Payer: Self-pay | Admitting: Family Medicine

## 2020-02-04 NOTE — Telephone Encounter (Signed)
Pt called to inform office to expect a fax from [employer]. They require [place] to sign and send back so they she may receive her products due to losing her bladder from cancer.  Advised front desk to be aware of fax.

## 2020-02-05 DIAGNOSIS — Z936 Other artificial openings of urinary tract status: Secondary | ICD-10-CM | POA: Diagnosis not present

## 2020-03-12 ENCOUNTER — Encounter: Payer: Self-pay | Admitting: Family Medicine

## 2020-04-12 DIAGNOSIS — H35033 Hypertensive retinopathy, bilateral: Secondary | ICD-10-CM | POA: Diagnosis not present

## 2020-04-21 ENCOUNTER — Encounter (HOSPITAL_COMMUNITY): Payer: Self-pay

## 2020-04-21 ENCOUNTER — Emergency Department (HOSPITAL_COMMUNITY): Payer: PPO

## 2020-04-21 ENCOUNTER — Emergency Department: Payer: PPO

## 2020-04-21 ENCOUNTER — Inpatient Hospital Stay (HOSPITAL_COMMUNITY)
Admission: EM | Admit: 2020-04-21 | Discharge: 2020-04-23 | DRG: 041 | Disposition: A | Discharge status: Home / Self Care | Payer: PPO | Attending: Internal Medicine | Admitting: Internal Medicine

## 2020-04-21 ENCOUNTER — Other Ambulatory Visit: Payer: Self-pay

## 2020-04-21 DIAGNOSIS — N1832 Chronic kidney disease, stage 3b: Secondary | ICD-10-CM | POA: Diagnosis present

## 2020-04-21 DIAGNOSIS — Z7982 Long term (current) use of aspirin: Secondary | ICD-10-CM | POA: Diagnosis not present

## 2020-04-21 DIAGNOSIS — E876 Hypokalemia: Secondary | ICD-10-CM | POA: Diagnosis not present

## 2020-04-21 DIAGNOSIS — Z8673 Personal history of transient ischemic attack (TIA), and cerebral infarction without residual deficits: Secondary | ICD-10-CM | POA: Diagnosis present

## 2020-04-21 DIAGNOSIS — I6523 Occlusion and stenosis of bilateral carotid arteries: Secondary | ICD-10-CM | POA: Diagnosis not present

## 2020-04-21 DIAGNOSIS — I63431 Cerebral infarction due to embolism of right posterior cerebral artery: Secondary | ICD-10-CM | POA: Diagnosis not present

## 2020-04-21 DIAGNOSIS — I639 Cerebral infarction, unspecified: Secondary | ICD-10-CM

## 2020-04-21 DIAGNOSIS — Z906 Acquired absence of other parts of urinary tract: Secondary | ICD-10-CM | POA: Diagnosis not present

## 2020-04-21 DIAGNOSIS — Z8249 Family history of ischemic heart disease and other diseases of the circulatory system: Secondary | ICD-10-CM | POA: Diagnosis not present

## 2020-04-21 DIAGNOSIS — Z8551 Personal history of malignant neoplasm of bladder: Secondary | ICD-10-CM

## 2020-04-21 DIAGNOSIS — E7800 Pure hypercholesterolemia, unspecified: Secondary | ICD-10-CM | POA: Diagnosis not present

## 2020-04-21 DIAGNOSIS — I69354 Hemiplegia and hemiparesis following cerebral infarction affecting left non-dominant side: Secondary | ICD-10-CM | POA: Diagnosis not present

## 2020-04-21 DIAGNOSIS — N289 Disorder of kidney and ureter, unspecified: Secondary | ICD-10-CM

## 2020-04-21 DIAGNOSIS — Z79899 Other long term (current) drug therapy: Secondary | ICD-10-CM

## 2020-04-21 DIAGNOSIS — R7303 Prediabetes: Secondary | ICD-10-CM | POA: Diagnosis not present

## 2020-04-21 DIAGNOSIS — R519 Headache, unspecified: Secondary | ICD-10-CM | POA: Diagnosis not present

## 2020-04-21 DIAGNOSIS — Z888 Allergy status to other drugs, medicaments and biological substances status: Secondary | ICD-10-CM

## 2020-04-21 DIAGNOSIS — I10 Essential (primary) hypertension: Secondary | ICD-10-CM | POA: Diagnosis not present

## 2020-04-21 DIAGNOSIS — Z87891 Personal history of nicotine dependence: Secondary | ICD-10-CM | POA: Diagnosis not present

## 2020-04-21 DIAGNOSIS — I69392 Facial weakness following cerebral infarction: Secondary | ICD-10-CM | POA: Diagnosis not present

## 2020-04-21 DIAGNOSIS — E042 Nontoxic multinodular goiter: Secondary | ICD-10-CM | POA: Diagnosis present

## 2020-04-21 DIAGNOSIS — E785 Hyperlipidemia, unspecified: Secondary | ICD-10-CM | POA: Diagnosis present

## 2020-04-21 DIAGNOSIS — Z20822 Contact with and (suspected) exposure to covid-19: Secondary | ICD-10-CM | POA: Diagnosis not present

## 2020-04-21 DIAGNOSIS — I160 Hypertensive urgency: Secondary | ICD-10-CM | POA: Diagnosis not present

## 2020-04-21 DIAGNOSIS — I129 Hypertensive chronic kidney disease with stage 1 through stage 4 chronic kidney disease, or unspecified chronic kidney disease: Secondary | ICD-10-CM | POA: Diagnosis present

## 2020-04-21 DIAGNOSIS — E041 Nontoxic single thyroid nodule: Secondary | ICD-10-CM | POA: Diagnosis not present

## 2020-04-21 DIAGNOSIS — I6389 Other cerebral infarction: Secondary | ICD-10-CM | POA: Diagnosis not present

## 2020-04-21 LAB — CBC WITH DIFFERENTIAL/PLATELET
Abs Immature Granulocytes: 0.01 10*3/uL (ref 0.00–0.07)
Basophils Absolute: 0 10*3/uL (ref 0.0–0.1)
Basophils Relative: 1 %
Eosinophils Absolute: 0.1 10*3/uL (ref 0.0–0.5)
Eosinophils Relative: 1 %
HCT: 45.4 % (ref 36.0–46.0)
Hemoglobin: 14.5 g/dL (ref 12.0–15.0)
Immature Granulocytes: 0 %
Lymphocytes Relative: 11 %
Lymphs Abs: 0.9 10*3/uL (ref 0.7–4.0)
MCH: 29.4 pg (ref 26.0–34.0)
MCHC: 31.9 g/dL (ref 30.0–36.0)
MCV: 91.9 fL (ref 80.0–100.0)
Monocytes Absolute: 1.1 10*3/uL — ABNORMAL HIGH (ref 0.1–1.0)
Monocytes Relative: 14 %
Neutro Abs: 5.6 10*3/uL (ref 1.7–7.7)
Neutrophils Relative %: 73 %
Platelets: 283 10*3/uL (ref 150–400)
RBC: 4.94 MIL/uL (ref 3.87–5.11)
RDW: 13.1 % (ref 11.5–15.5)
WBC: 7.7 10*3/uL (ref 4.0–10.5)
nRBC: 0 % (ref 0.0–0.2)

## 2020-04-21 LAB — URINALYSIS, ROUTINE W REFLEX MICROSCOPIC
Bilirubin Urine: NEGATIVE
Glucose, UA: NEGATIVE mg/dL
Hgb urine dipstick: NEGATIVE
Ketones, ur: NEGATIVE mg/dL
Nitrite: NEGATIVE
Protein, ur: 100 mg/dL — AB
Specific Gravity, Urine: 1.016 (ref 1.005–1.030)
WBC, UA: >50 WBC/hpf — ABNORMAL HIGH (ref 0–5)
pH: 7 (ref 5.0–8.0)

## 2020-04-21 LAB — I-STAT CHEM 8, ED
BUN: 15 mg/dL (ref 8–23)
Calcium, Ion: 1.19 mmol/L (ref 1.15–1.40)
Chloride: 94 mmol/L — ABNORMAL LOW (ref 98–111)
Creatinine, Ser: 1.3 mg/dL — ABNORMAL HIGH (ref 0.44–1.00)
Glucose, Bld: 133 mg/dL — ABNORMAL HIGH (ref 70–99)
HCT: 46 % (ref 36.0–46.0)
Hemoglobin: 15.6 g/dL — ABNORMAL HIGH (ref 12.0–15.0)
Potassium: 3.3 mmol/L — ABNORMAL LOW (ref 3.5–5.1)
Sodium: 140 mmol/L (ref 135–145)
TCO2: 31 mmol/L (ref 22–32)

## 2020-04-21 LAB — COMPREHENSIVE METABOLIC PANEL
ALT: 12 U/L (ref 0–44)
AST: 16 U/L (ref 15–41)
Albumin: 3.7 g/dL (ref 3.5–5.0)
Alkaline Phosphatase: 74 U/L (ref 38–126)
Anion gap: 12 (ref 5–15)
BUN: 16 mg/dL (ref 8–23)
CO2: 31 mmol/L (ref 22–32)
Calcium: 9 mg/dL (ref 8.9–10.3)
Chloride: 96 mmol/L — ABNORMAL LOW (ref 98–111)
Creatinine, Ser: 1.46 mg/dL — ABNORMAL HIGH (ref 0.44–1.00)
GFR calc Af Amer: 39 mL/min — ABNORMAL LOW (ref >60)
GFR calc non Af Amer: 33 mL/min — ABNORMAL LOW (ref >60)
Glucose, Bld: 143 mg/dL — ABNORMAL HIGH (ref 70–99)
Potassium: 3.4 mmol/L — ABNORMAL LOW (ref 3.5–5.1)
Sodium: 139 mmol/L (ref 135–145)
Total Bilirubin: 0.5 mg/dL (ref 0.3–1.2)
Total Protein: 7.3 g/dL (ref 6.5–8.1)

## 2020-04-21 LAB — MAGNESIUM: Magnesium: 2.3 mg/dL (ref 1.7–2.4)

## 2020-04-21 LAB — TSH: TSH: 1.081 u[IU]/mL (ref 0.350–4.500)

## 2020-04-21 IMAGING — MR MR HEAD W/O CM
11 series · 48 of 48 positions shown · non-contrast
Comparison: CT angiogram head/neck performed earlier the same
evening [DATE].

CLINICAL DATA: Neuro deficit, acute, stroke suspected. Additional
provided: Numbness of left arm and left-sided facial numbness.

EXAM:
MRI HEAD WITHOUT CONTRAST
TECHNIQUE: Multiplanar, multiecho pulse sequences of the brain and surrounding
structures were obtained without intravenous contrast.

[Series 5: DWI · axial · 3.0mm · 1.36mm/px · z∈[-45,+105]mm · 7 of 103 slices shown (1 of 4)]
[im 1/103]
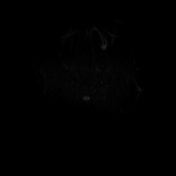
[im 18/103]
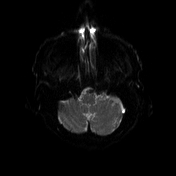
[im 35/103]
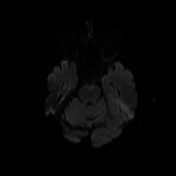
[im 52/103]
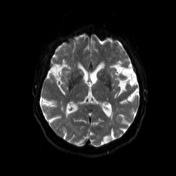
[im 69/103]
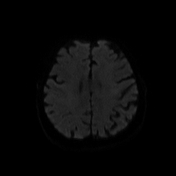
[im 86/103]
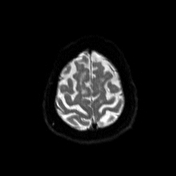
[im 103/103]
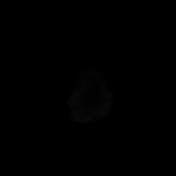

[Series 6: DWI · axial · 3.0mm · 1.36mm/px · z∈[-45,+105]mm · 4 of 52 slices shown (2 of 4)]
[im 1/52]
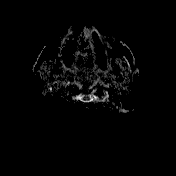
[im 18/52]
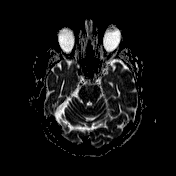
[im 35/52]
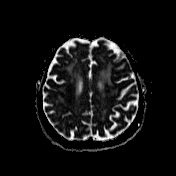
[im 52/52]
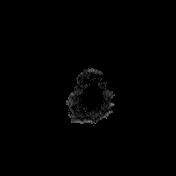

[Series 7: T1 · sagittal · 5.0mm · 0.75mm/px · 2 of 25 slices shown (1 of 2)]
[im 1/25]
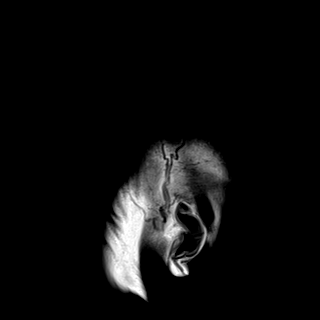
[im 25/25]
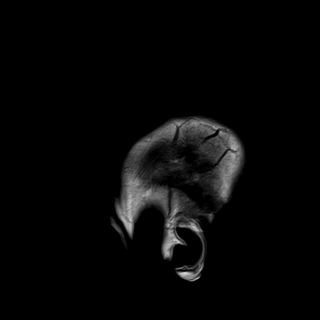

[Series 8: T2 · axial · 5.0mm · 0.62mm/px · z∈[-44,+109]mm · 2 of 25 slices shown (1 of 2)]
[im 1/25]
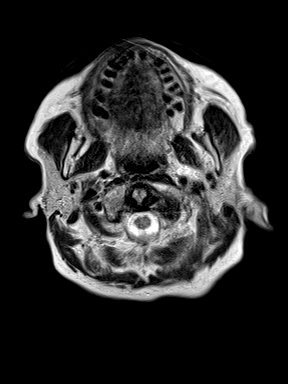
[im 25/25]
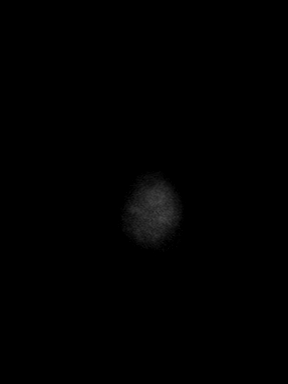

[Series 9: mip_images(sw) · axial · 24.0mm · 0.75mm/px · z∈[-38,+103]mm · 4 of 49 slices shown]
[im 1/49]
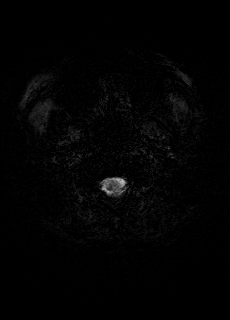
[im 17/49]
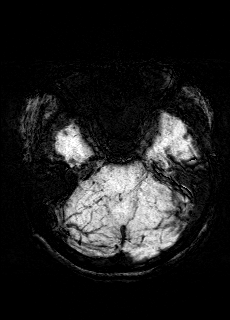
[im 33/49]
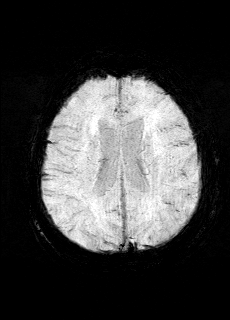
[im 49/49]
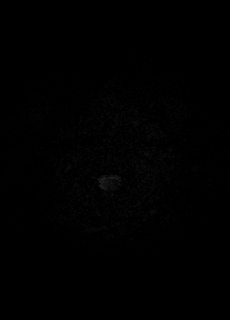

[Series 10: swi_images · axial · 3.0mm · 0.75mm/px · z∈[-48,+113]mm · 4 of 56 slices shown]
[im 1/56]
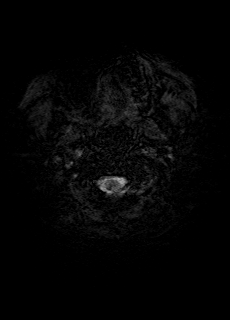
[im 19/56]
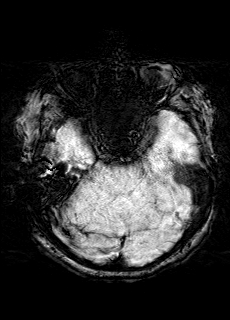
[im 37/56]
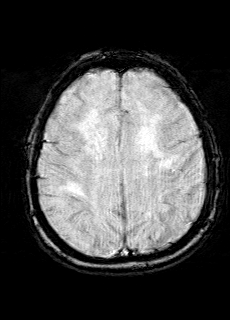
[im 56/56]
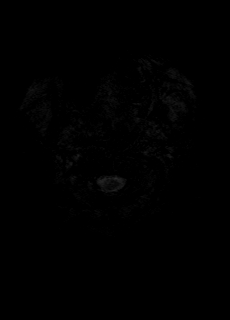

[Series 11: FLAIR · axial · 3.0mm · 0.75mm/px · z∈[-42,+107]mm · 4 of 52 slices shown]
[im 1/52]
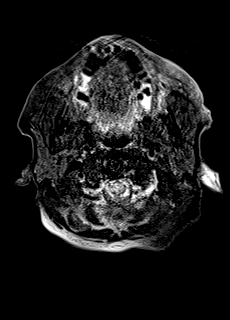
[im 18/52]
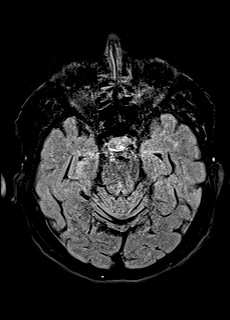
[im 35/52]
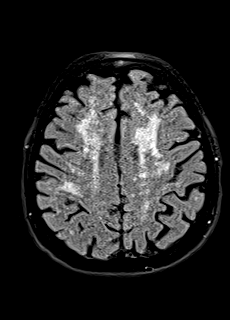
[im 52/52]
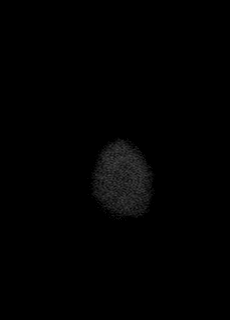

[Series 12: DWI · coronal · 5.0mm · 1.31mm/px · 5 of 64 slices shown (3 of 4)]
[im 1/64]
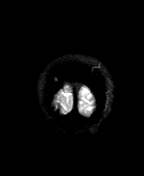
[im 16/64]
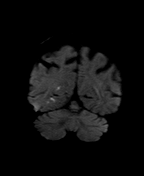
[im 32/64]
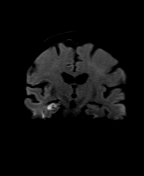
[im 48/64]
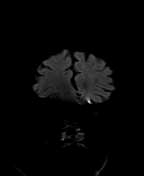
[im 64/64]
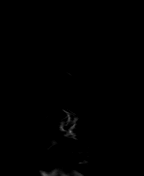

[Series 13: DWI · coronal · 5.0mm · 1.31mm/px · 2 of 32 slices shown (4 of 4)]
[im 1/32]
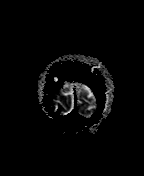
[im 32/32]
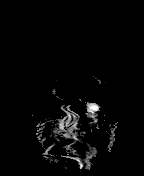

[Series 14: T1 · axial · 1.0mm · 0.94mm/px · z∈[-45,+110]mm · 12 of 160 slices shown (2 of 2)]
[im 1/160]
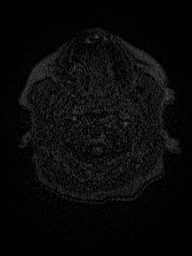
[im 15/160]
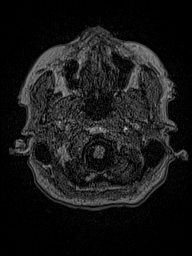
[im 29/160]
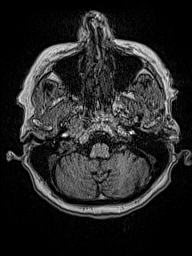
[im 44/160]
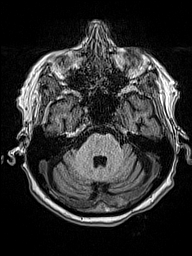
[im 58/160]
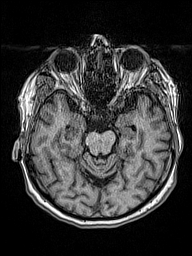
[im 73/160]
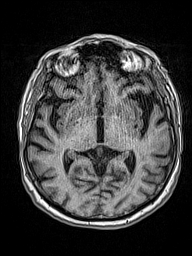
[im 87/160]
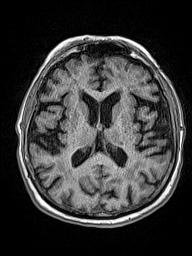
[im 102/160]
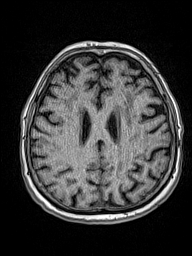
[im 116/160]
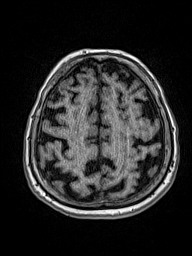
[im 131/160]
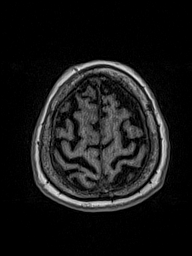
[im 145/160]
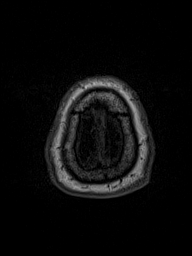
[im 160/160]
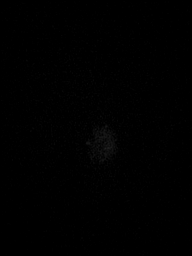

[Series 15: T2 · coronal · 5.0mm · 0.57mm/px · 2 of 32 slices shown (2 of 2)]
[im 1/32]
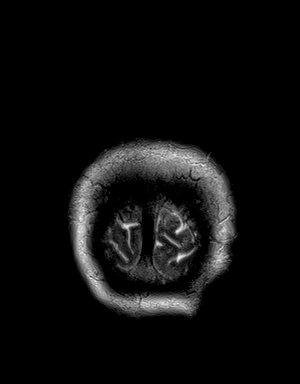
[im 32/32]
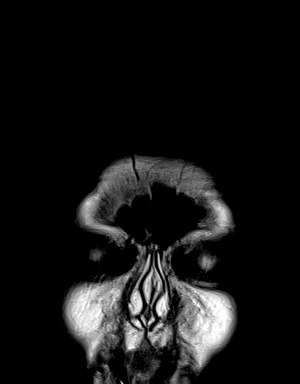

[48 of 48 positions shown; findings below may reference images not displayed]

FINDINGS: Brain:

The examination is intermittently motion degraded. Most notably,
there is moderate motion degradation of the axial T2/FLAIR sequence.

There is restricted diffusion consistent with acute infarction
within the mesial right temporal lobe/hippocampus. There are
additional patchy small acute infarcts more posteriorly within the
medial right temporal lobe and right occipital lobe. Punctate acute
infarct within the splenium of the corpus callosum on the right.
Additionally, there are small acute lacunar infarcts within the
right thalamus. Corresponding T2/FLAIR hyperintensity at these
sites.

Moderate/advanced patchy and confluent T2/FLAIR hyperintensity
within the cerebral white matter which is nonspecific, but
consistent with chronic small vessel ischemic disease. Mild chronic
small vessel ischemic changes also present within the brainstem.
Moderate generalized parenchymal atrophy.

No evidence of intracranial mass.

No chronic intracranial blood products.

No extra-axial fluid collection.

No midline shift.

Vascular: A focal occlusion or near-occlusive stenosis was
demonstrated within the proximal right posterior cerebral artery on
CTA performed earlier the same day.

Skull and upper cervical spine: No focal marrow lesion.

Sinuses/Orbits: Visualized orbits show no acute finding. Mild
ethmoid sinus mucosal thickening. No significant mastoid effusion.

These results were called by telephone at the time of interpretation
on [DATE] at [DATE] to provider DIAS LOPES , who
verbally acknowledged these results.
IMPRESSION: 1. Motion degraded examination.
2. Right PCA territory acute infarcts involving the mesial right
temporal lobe/hippocampus, medial right temporal lobe more
posteriorly, right occipital lobe and callosal splenium. Small acute
lacunar infarcts are also present within the right thalamus.
3. Background moderate to advanced chronic small vessel ischemic
changes predominantly within the cerebral white matter.
4. Moderate generalized parenchymal atrophy.

## 2020-04-21 IMAGING — CT CT ANGIO NECK
2 of 10 series · 7 of 33 positions shown · IV contrast (omnipaque)
Comparison: Report from thyroid biopsy [DATE], thyroid
ultrasound [DATE]
COMPARISON: Report from thyroid biopsy [DATE], thyroid
ultrasound [DATE]

Addendum:
CLINICAL DATA: Neuro deficit, acute, stroke suspected. Left arm and
left face numbness, headache, dizziness since yesterday. Possible
right neck bruit. Rule out stroke or vascular abnormality.

EXAM:
CT ANGIOGRAPHY HEAD AND NECK
TECHNIQUE: Multidetector CT imaging of the head and neck was performed using
the standard protocol during bolus administration of intravenous
contrast. Multiplanar CT image reconstructions and MIPs were
obtained to evaluate the vascular anatomy. Carotid stenosis
measurements (when applicable) are obtained utilizing NASCET
criteria, using the distal internal carotid diameter as the
denominator.
CONTRAST:  75mL OMNIPAQUE IOHEXOL 350 MG/ML SOLN

[Series 9: cta head neck · axial · 0.43mm/px · z∈[-326,+18]mm · 3 of 170 slices shown]
[im 1/170  soft-tissue]
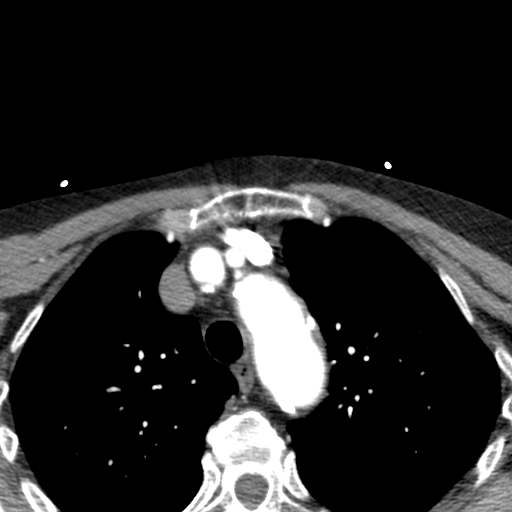
[im 85/170  bone]
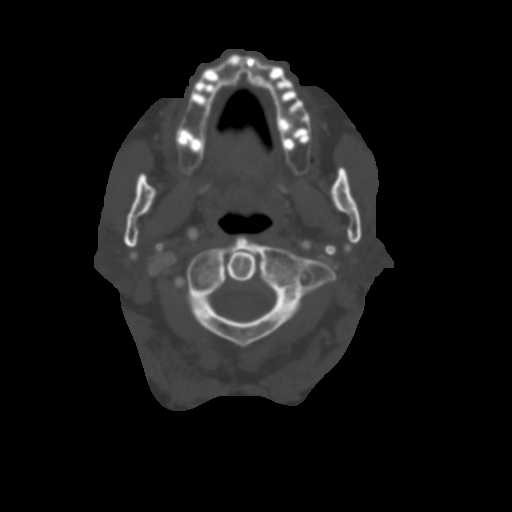
[im 170/170  soft-tissue]
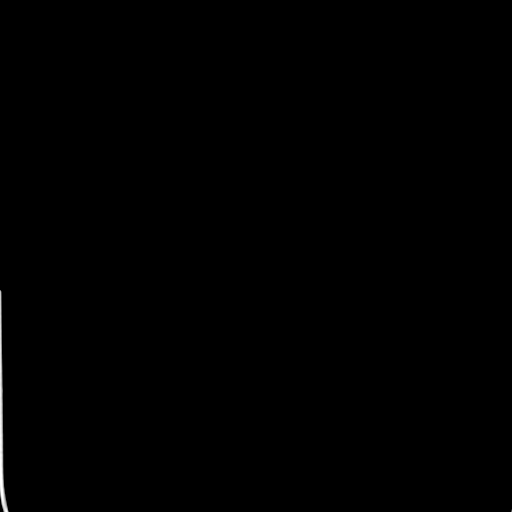

[Series 11: ax thin · axial · 0.31mm/px · z∈[-278,-134]mm · 4 of 337 slices shown]
[im 49/337  soft-tissue]
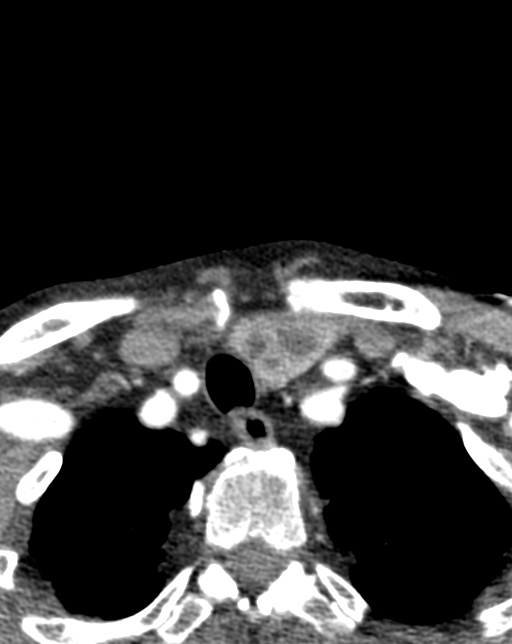
[im 97/337  soft-tissue]
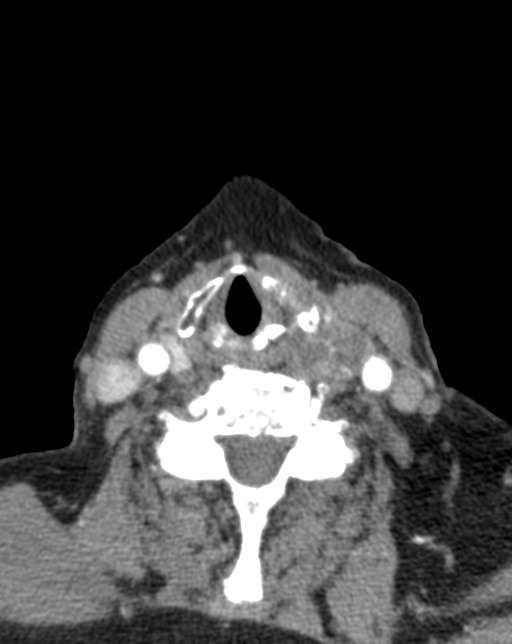
[im 145/337  soft-tissue]
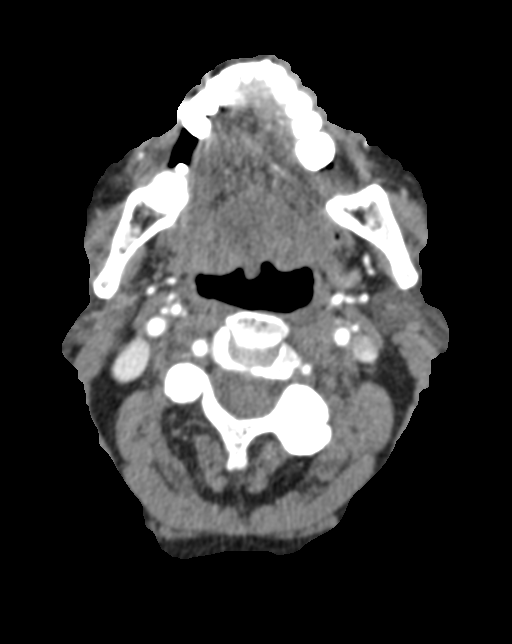
[im 193/337  soft-tissue]
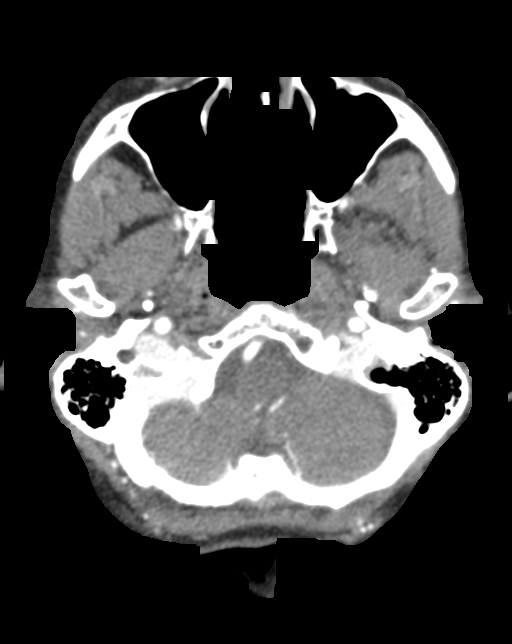

[7 of 33 positions shown; findings below may reference images not displayed]

FINDINGS: CT HEAD FINDINGS

Brain:

There is moderate/advanced ill-defined hypoattenuation within the
cerebral white matter which is nonspecific, but consistent with
chronic small vessel ischemic disease. Moderate generalized
parenchymal atrophy.

There is no acute intracranial hemorrhage.

No demarcated cortical infarct.

No extra-axial fluid collection.

No evidence of intracranial mass.

No midline shift.

Vascular: No hyperdense vessel.  Atherosclerotic calcifications.

Skull: Normal. Negative for fracture or focal lesion.

Sinuses: Mild ethmoid sinus mucosal thickening. No significant
mastoid effusion.

Orbits: No acute abnormality.

Review of the MIP images confirms the above findings

CTA NECK FINDINGS

Aortic arch: Atherosclerotic plaque within the visualized aortic
arch and proximal major branch vessels of the neck. The innominate,
left common carotid artery and left vertebral artery origins are not
included in the field of view.

Right carotid system: The CCA is patent to the bifurcation without
measurable stenosis. Calcified plaque within the carotid bifurcation
with prominent mixed plaque at the origin of the ECA. Moderate mixed
plaque within the proximal ICA with up to 30-40 % stenosis at this
site. Distal to this, the ICA is patent within the neck without
significant stenosis.

Left carotid system: The origin of the left common carotid artery is
excluded from the field of view. The CCA is displaced laterally by
the large left thyroid nodule. The visualized CCA and ICA are patent
within the neck without significant stenosis (50% or greater). Mild
calcified plaque within the carotid bifurcation, proximal ICA and
proximal ECA.

Vertebral arteries: The dominant right vertebral artery is patent
throughout the neck without significant stenosis (50% or greater).
Mild calcified plaque at the vertebral artery origin and within the
distal V2 segment. The non dominant left vertebral artery arises
directly from the aortic arch. The origin of this vessel is not
included in the field of view. Apparent high-grade stenosis within
the proximal V1 left vertebral artery, although this may be
accentuated by motion artifact.

Skeleton: No acute bony abnormality or aggressive osseous lesion.
Cervical spondylosis with multilevel disc space narrowing, posterior
disc osteophytes, uncovertebral and facet hypertrophy.

Other neck: Redemonstrated large nodule within the left thyroid lobe
measuring 4.4 x 3.9 cm in transaxial dimensions. By report this has
not significantly changed in size as compared to thyroid ultrasound
[DATE] (previously measuring 4.2 x 3.0 x 4.0 cm). There are
additional nodules more inferiorly within the left thyroid lobe
measuring up to 16 mm not described previously.

Upper chest: No consolidation within the imaged lung apices.

Review of the MIP images confirms the above findings

CTA HEAD FINDINGS

Anterior circulation:

The intracranial internal carotid arteries are patent bilaterally.
Mild calcified plaque within these vessels with no more than mild
stenosis.

The M1 middle cerebral arteries are patent without significant
stenosis. No M2 proximal branch occlusion or high-grade proximal
stenosis is identified. Atherosclerotic irregularity of the M2 and
more distal MCA branch vessels bilaterally. Most notably there are
tandem moderate to moderately advanced stenoses within a proximal M2
left MCA branch vessel (series 16, images 23 and 24).

The anterior cerebral arteries are patent without high-grade
proximal stenosis.

No intracranial aneurysm is identified.

Posterior circulation:

The dominant intracranial right vertebral artery is patent without
significant stenosis. The non dominant left vertebral artery is
developmentally diminutive beyond the origin of the left PICA,
although patent. The basilar artery is patent without significant
stenosis. Fetal origin right posterior cerebral artery. There is
focal occlusion or high-grade stenosis within the proximal right
posterior cerebral artery (series 13, images 75-77). Some flow is
seen more distally within the right posterior cerebral artery,
although this vessel remains markedly diminutive and irregular. The
left posterior cerebral artery is patent without significant
proximal stenosis. The left posterior communicating artery is
hypoplastic or absent.

Venous sinuses: Within limitations of contrast timing, no convincing
thrombus.

Anatomic variants: As described

Review of the MIP images confirms the above findings
IMPRESSION: CT head:

1. No evidence of acute intracranial abnormality.
2. Moderate/advanced chronic small vessel ischemic changes within
the cerebral white matter.
3. Moderate generalized parenchymal atrophy.
4. Mild ethmoid sinus mucosal thickening.

CTA neck:

1. The right common and internal carotid arteries are patent within
the neck. Atherosclerotic plaque results in [DATE]% stenosis of the
proximal right ICA.
2. The origin of the left common carotid artery is excluded from the
field of view. Within this limitation, the left common and internal
carotid arteries are patent within the neck without significant
stenosis. Mild calcified plaque at the left carotid bifurcation.
3. The dominant right vertebral artery is patent throughout the neck
without significant stenosis.
4. The non-dominant left vertebral artery arises directly from the
aortic arch. The origin of this vessel is not included in the field
of view. There is apparent high-grade stenosis within the proximal
V1 segment, although this may be accentuated by motion artifact.
5. A 4.4 cm left thyroid lobe nodule has not significantly changed
in size since thyroid biopsy [DATE] by report. Please correlate
with previous pathology results. However, there are additional
thyroid nodules within the inferior left thyroid lobe measuring up
to 16 mm which were not previously described. Nonemergent thyroid
ultrasound is recommended for further characterization.

CTA head:

1. The right posterior cerebral artery is fetal in origin. There is
a focal occlusion or near-occlusive stenosis proximally within this
vessel. Some flow is seen more distally within the right PCA,
although distally this vessel remains markedly diminutive and
irregular.
2. Atherosclerotic irregularity of the M2 and more distal MCA branch
vessels bilaterally. Most notably, there are tandem moderate to
moderately severe stenoses within a proximal M2 left MCA branch
vessel.
3. Atherosclerotic plaque within the intracranial internal carotid
arteries with no more than mild stenosis.

ADDENDUM:
These results were called by telephone at the time of interpretation
on [DATE] at [DATE] to provider JASMIN AKTAR , who
verbally acknowledged these results.

*** End of Addendum ***
FINDINGS: CT HEAD FINDINGS

Brain:

There is moderate/advanced ill-defined hypoattenuation within the
cerebral white matter which is nonspecific, but consistent with
chronic small vessel ischemic disease. Moderate generalized
parenchymal atrophy.

There is no acute intracranial hemorrhage.

No demarcated cortical infarct.

No extra-axial fluid collection.

No evidence of intracranial mass.

No midline shift.

Vascular: No hyperdense vessel.  Atherosclerotic calcifications.

Skull: Normal. Negative for fracture or focal lesion.

Sinuses: Mild ethmoid sinus mucosal thickening. No significant
mastoid effusion.

Orbits: No acute abnormality.

Review of the MIP images confirms the above findings

CTA NECK FINDINGS

Aortic arch: Atherosclerotic plaque within the visualized aortic
arch and proximal major branch vessels of the neck. The innominate,
left common carotid artery and left vertebral artery origins are not
included in the field of view.

Right carotid system: The CCA is patent to the bifurcation without
measurable stenosis. Calcified plaque within the carotid bifurcation
with prominent mixed plaque at the origin of the ECA. Moderate mixed
plaque within the proximal ICA with up to 30-40 % stenosis at this
site. Distal to this, the ICA is patent within the neck without
significant stenosis.

Left carotid system: The origin of the left common carotid artery is
excluded from the field of view. The CCA is displaced laterally by
the large left thyroid nodule. The visualized CCA and ICA are patent
within the neck without significant stenosis (50% or greater). Mild
calcified plaque within the carotid bifurcation, proximal ICA and
proximal ECA.

Vertebral arteries: The dominant right vertebral artery is patent
throughout the neck without significant stenosis (50% or greater).
Mild calcified plaque at the vertebral artery origin and within the
distal V2 segment. The non dominant left vertebral artery arises
directly from the aortic arch. The origin of this vessel is not
included in the field of view. Apparent high-grade stenosis within
the proximal V1 left vertebral artery, although this may be
accentuated by motion artifact.

Skeleton: No acute bony abnormality or aggressive osseous lesion.
Cervical spondylosis with multilevel disc space narrowing, posterior
disc osteophytes, uncovertebral and facet hypertrophy.

Other neck: Redemonstrated large nodule within the left thyroid lobe
measuring 4.4 x 3.9 cm in transaxial dimensions. By report this has
not significantly changed in size as compared to thyroid ultrasound
[DATE] (previously measuring 4.2 x 3.0 x 4.0 cm). There are
additional nodules more inferiorly within the left thyroid lobe
measuring up to 16 mm not described previously.

Upper chest: No consolidation within the imaged lung apices.

Review of the MIP images confirms the above findings

CTA HEAD FINDINGS

Anterior circulation:

The intracranial internal carotid arteries are patent bilaterally.
Mild calcified plaque within these vessels with no more than mild
stenosis.

The M1 middle cerebral arteries are patent without significant
stenosis. No M2 proximal branch occlusion or high-grade proximal
stenosis is identified. Atherosclerotic irregularity of the M2 and
more distal MCA branch vessels bilaterally. Most notably there are
tandem moderate to moderately advanced stenoses within a proximal M2
left MCA branch vessel (series 16, images 23 and 24).

The anterior cerebral arteries are patent without high-grade
proximal stenosis.

No intracranial aneurysm is identified.

Posterior circulation:

The dominant intracranial right vertebral artery is patent without
significant stenosis. The non dominant left vertebral artery is
developmentally diminutive beyond the origin of the left PICA,
although patent. The basilar artery is patent without significant
stenosis. Fetal origin right posterior cerebral artery. There is
focal occlusion or high-grade stenosis within the proximal right
posterior cerebral artery (series 13, images 75-77). Some flow is
seen more distally within the right posterior cerebral artery,
although this vessel remains markedly diminutive and irregular. The
left posterior cerebral artery is patent without significant
proximal stenosis. The left posterior communicating artery is
hypoplastic or absent.

Venous sinuses: Within limitations of contrast timing, no convincing
thrombus.

Anatomic variants: As described

Review of the MIP images confirms the above findings
IMPRESSION: CT head:

1. No evidence of acute intracranial abnormality.
2. Moderate/advanced chronic small vessel ischemic changes within
the cerebral white matter.
3. Moderate generalized parenchymal atrophy.
4. Mild ethmoid sinus mucosal thickening.

CTA neck:

1. The right common and internal carotid arteries are patent within
the neck. Atherosclerotic plaque results in [DATE]% stenosis of the
proximal right ICA.
2. The origin of the left common carotid artery is excluded from the
field of view. Within this limitation, the left common and internal
carotid arteries are patent within the neck without significant
stenosis. Mild calcified plaque at the left carotid bifurcation.
3. The dominant right vertebral artery is patent throughout the neck
without significant stenosis.
4. The non-dominant left vertebral artery arises directly from the
aortic arch. The origin of this vessel is not included in the field
of view. There is apparent high-grade stenosis within the proximal
V1 segment, although this may be accentuated by motion artifact.
5. A 4.4 cm left thyroid lobe nodule has not significantly changed
in size since thyroid biopsy [DATE] by report. Please correlate
with previous pathology results. However, there are additional
thyroid nodules within the inferior left thyroid lobe measuring up
to 16 mm which were not previously described. Nonemergent thyroid
ultrasound is recommended for further characterization.

CTA head:

1. The right posterior cerebral artery is fetal in origin. There is
a focal occlusion or near-occlusive stenosis proximally within this
vessel. Some flow is seen more distally within the right PCA,
although distally this vessel remains markedly diminutive and
irregular.
2. Atherosclerotic irregularity of the M2 and more distal MCA branch
vessels bilaterally. Most notably, there are tandem moderate to
moderately severe stenoses within a proximal M2 left MCA branch
vessel.
3. Atherosclerotic plaque within the intracranial internal carotid
arteries with no more than mild stenosis.

## 2020-04-21 IMAGING — CT CT ANGIO HEAD
2 of 10 series · 7 of 33 positions shown · IV contrast (omnipaque)
Comparison: Report from thyroid biopsy [DATE], thyroid
ultrasound [DATE]
COMPARISON: Report from thyroid biopsy [DATE], thyroid
ultrasound [DATE]

Addendum:
CLINICAL DATA: Neuro deficit, acute, stroke suspected. Left arm and
left face numbness, headache, dizziness since yesterday. Possible
right neck bruit. Rule out stroke or vascular abnormality.

EXAM:
CT ANGIOGRAPHY HEAD AND NECK
TECHNIQUE: Multidetector CT imaging of the head and neck was performed using
the standard protocol during bolus administration of intravenous
contrast. Multiplanar CT image reconstructions and MIPs were
obtained to evaluate the vascular anatomy. Carotid stenosis
measurements (when applicable) are obtained utilizing NASCET
criteria, using the distal internal carotid diameter as the
denominator.
CONTRAST:  75mL OMNIPAQUE IOHEXOL 350 MG/ML SOLN

[Series 9: cta head neck · axial · 0.43mm/px · z∈[-326,+18]mm · 3 of 170 slices shown]
[im 1/170  soft-tissue]
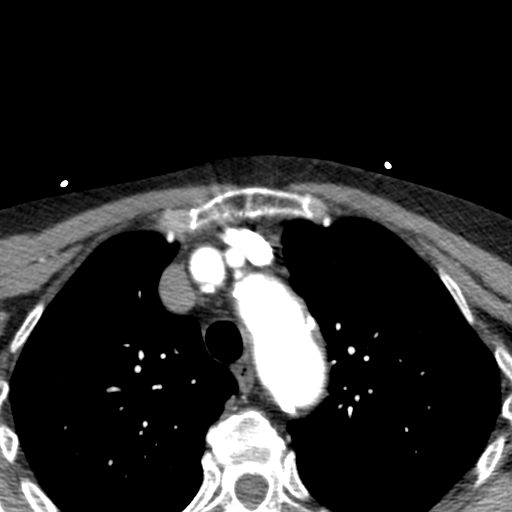
[im 85/170  bone]
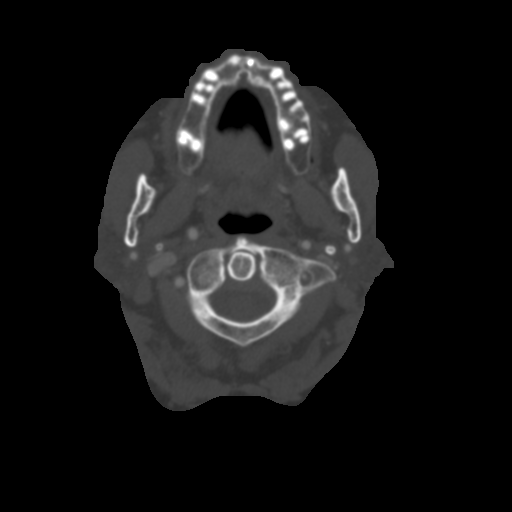
[im 170/170  soft-tissue]
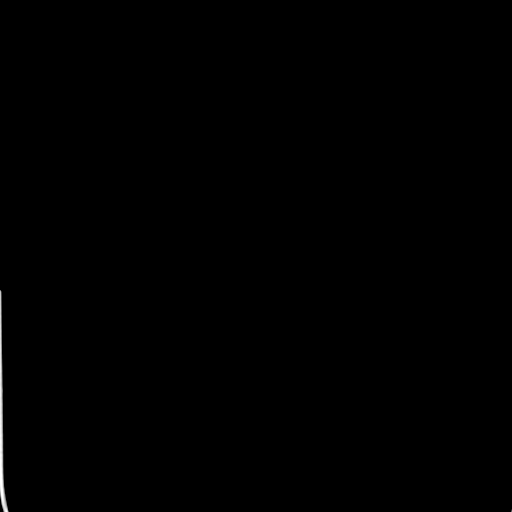

[Series 11: ax thin · axial · 0.31mm/px · z∈[-278,-134]mm · 4 of 337 slices shown]
[im 49/337  soft-tissue]
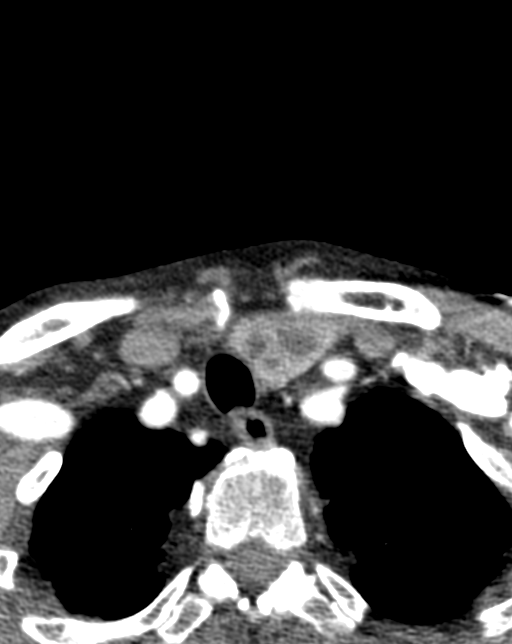
[im 97/337  soft-tissue]
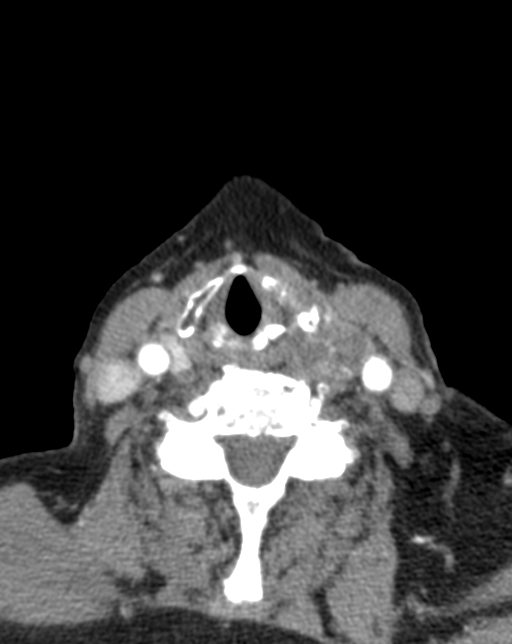
[im 145/337  soft-tissue]
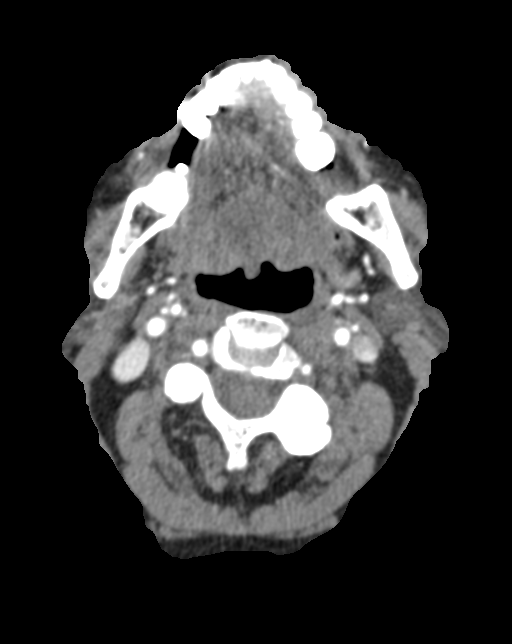
[im 193/337  soft-tissue]
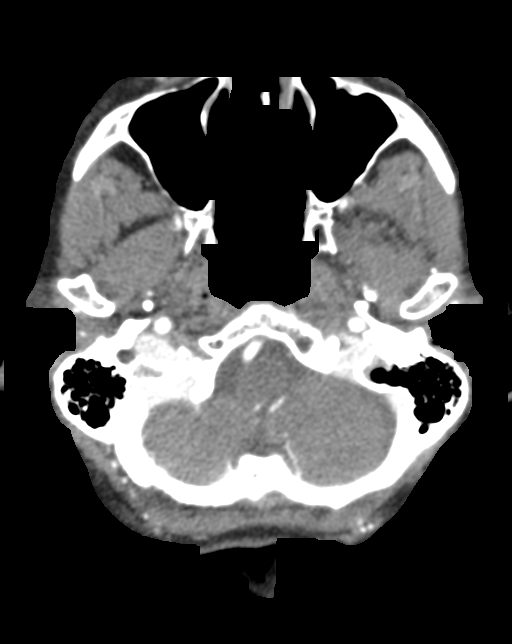

[7 of 33 positions shown; findings below may reference images not displayed]

FINDINGS: CT HEAD FINDINGS

Brain:

There is moderate/advanced ill-defined hypoattenuation within the
cerebral white matter which is nonspecific, but consistent with
chronic small vessel ischemic disease. Moderate generalized
parenchymal atrophy.

There is no acute intracranial hemorrhage.

No demarcated cortical infarct.

No extra-axial fluid collection.

No evidence of intracranial mass.

No midline shift.

Vascular: No hyperdense vessel.  Atherosclerotic calcifications.

Skull: Normal. Negative for fracture or focal lesion.

Sinuses: Mild ethmoid sinus mucosal thickening. No significant
mastoid effusion.

Orbits: No acute abnormality.

Review of the MIP images confirms the above findings

CTA NECK FINDINGS

Aortic arch: Atherosclerotic plaque within the visualized aortic
arch and proximal major branch vessels of the neck. The innominate,
left common carotid artery and left vertebral artery origins are not
included in the field of view.

Right carotid system: The CCA is patent to the bifurcation without
measurable stenosis. Calcified plaque within the carotid bifurcation
with prominent mixed plaque at the origin of the ECA. Moderate mixed
plaque within the proximal ICA with up to 30-40 % stenosis at this
site. Distal to this, the ICA is patent within the neck without
significant stenosis.

Left carotid system: The origin of the left common carotid artery is
excluded from the field of view. The CCA is displaced laterally by
the large left thyroid nodule. The visualized CCA and ICA are patent
within the neck without significant stenosis (50% or greater). Mild
calcified plaque within the carotid bifurcation, proximal ICA and
proximal ECA.

Vertebral arteries: The dominant right vertebral artery is patent
throughout the neck without significant stenosis (50% or greater).
Mild calcified plaque at the vertebral artery origin and within the
distal V2 segment. The non dominant left vertebral artery arises
directly from the aortic arch. The origin of this vessel is not
included in the field of view. Apparent high-grade stenosis within
the proximal V1 left vertebral artery, although this may be
accentuated by motion artifact.

Skeleton: No acute bony abnormality or aggressive osseous lesion.
Cervical spondylosis with multilevel disc space narrowing, posterior
disc osteophytes, uncovertebral and facet hypertrophy.

Other neck: Redemonstrated large nodule within the left thyroid lobe
measuring 4.4 x 3.9 cm in transaxial dimensions. By report this has
not significantly changed in size as compared to thyroid ultrasound
[DATE] (previously measuring 4.2 x 3.0 x 4.0 cm). There are
additional nodules more inferiorly within the left thyroid lobe
measuring up to 16 mm not described previously.

Upper chest: No consolidation within the imaged lung apices.

Review of the MIP images confirms the above findings

CTA HEAD FINDINGS

Anterior circulation:

The intracranial internal carotid arteries are patent bilaterally.
Mild calcified plaque within these vessels with no more than mild
stenosis.

The M1 middle cerebral arteries are patent without significant
stenosis. No M2 proximal branch occlusion or high-grade proximal
stenosis is identified. Atherosclerotic irregularity of the M2 and
more distal MCA branch vessels bilaterally. Most notably there are
tandem moderate to moderately advanced stenoses within a proximal M2
left MCA branch vessel (series 16, images 23 and 24).

The anterior cerebral arteries are patent without high-grade
proximal stenosis.

No intracranial aneurysm is identified.

Posterior circulation:

The dominant intracranial right vertebral artery is patent without
significant stenosis. The non dominant left vertebral artery is
developmentally diminutive beyond the origin of the left PICA,
although patent. The basilar artery is patent without significant
stenosis. Fetal origin right posterior cerebral artery. There is
focal occlusion or high-grade stenosis within the proximal right
posterior cerebral artery (series 13, images 75-77). Some flow is
seen more distally within the right posterior cerebral artery,
although this vessel remains markedly diminutive and irregular. The
left posterior cerebral artery is patent without significant
proximal stenosis. The left posterior communicating artery is
hypoplastic or absent.

Venous sinuses: Within limitations of contrast timing, no convincing
thrombus.

Anatomic variants: As described

Review of the MIP images confirms the above findings
IMPRESSION: CT head:

1. No evidence of acute intracranial abnormality.
2. Moderate/advanced chronic small vessel ischemic changes within
the cerebral white matter.
3. Moderate generalized parenchymal atrophy.
4. Mild ethmoid sinus mucosal thickening.

CTA neck:

1. The right common and internal carotid arteries are patent within
the neck. Atherosclerotic plaque results in [DATE]% stenosis of the
proximal right ICA.
2. The origin of the left common carotid artery is excluded from the
field of view. Within this limitation, the left common and internal
carotid arteries are patent within the neck without significant
stenosis. Mild calcified plaque at the left carotid bifurcation.
3. The dominant right vertebral artery is patent throughout the neck
without significant stenosis.
4. The non-dominant left vertebral artery arises directly from the
aortic arch. The origin of this vessel is not included in the field
of view. There is apparent high-grade stenosis within the proximal
V1 segment, although this may be accentuated by motion artifact.
5. A 4.4 cm left thyroid lobe nodule has not significantly changed
in size since thyroid biopsy [DATE] by report. Please correlate
with previous pathology results. However, there are additional
thyroid nodules within the inferior left thyroid lobe measuring up
to 16 mm which were not previously described. Nonemergent thyroid
ultrasound is recommended for further characterization.

CTA head:

1. The right posterior cerebral artery is fetal in origin. There is
a focal occlusion or near-occlusive stenosis proximally within this
vessel. Some flow is seen more distally within the right PCA,
although distally this vessel remains markedly diminutive and
irregular.
2. Atherosclerotic irregularity of the M2 and more distal MCA branch
vessels bilaterally. Most notably, there are tandem moderate to
moderately severe stenoses within a proximal M2 left MCA branch
vessel.
3. Atherosclerotic plaque within the intracranial internal carotid
arteries with no more than mild stenosis.

ADDENDUM:
These results were called by telephone at the time of interpretation
on [DATE] at [DATE] to provider JASMIN AKTAR , who
verbally acknowledged these results.

*** End of Addendum ***
FINDINGS: CT HEAD FINDINGS

Brain:

There is moderate/advanced ill-defined hypoattenuation within the
cerebral white matter which is nonspecific, but consistent with
chronic small vessel ischemic disease. Moderate generalized
parenchymal atrophy.

There is no acute intracranial hemorrhage.

No demarcated cortical infarct.

No extra-axial fluid collection.

No evidence of intracranial mass.

No midline shift.

Vascular: No hyperdense vessel.  Atherosclerotic calcifications.

Skull: Normal. Negative for fracture or focal lesion.

Sinuses: Mild ethmoid sinus mucosal thickening. No significant
mastoid effusion.

Orbits: No acute abnormality.

Review of the MIP images confirms the above findings

CTA NECK FINDINGS

Aortic arch: Atherosclerotic plaque within the visualized aortic
arch and proximal major branch vessels of the neck. The innominate,
left common carotid artery and left vertebral artery origins are not
included in the field of view.

Right carotid system: The CCA is patent to the bifurcation without
measurable stenosis. Calcified plaque within the carotid bifurcation
with prominent mixed plaque at the origin of the ECA. Moderate mixed
plaque within the proximal ICA with up to 30-40 % stenosis at this
site. Distal to this, the ICA is patent within the neck without
significant stenosis.

Left carotid system: The origin of the left common carotid artery is
excluded from the field of view. The CCA is displaced laterally by
the large left thyroid nodule. The visualized CCA and ICA are patent
within the neck without significant stenosis (50% or greater). Mild
calcified plaque within the carotid bifurcation, proximal ICA and
proximal ECA.

Vertebral arteries: The dominant right vertebral artery is patent
throughout the neck without significant stenosis (50% or greater).
Mild calcified plaque at the vertebral artery origin and within the
distal V2 segment. The non dominant left vertebral artery arises
directly from the aortic arch. The origin of this vessel is not
included in the field of view. Apparent high-grade stenosis within
the proximal V1 left vertebral artery, although this may be
accentuated by motion artifact.

Skeleton: No acute bony abnormality or aggressive osseous lesion.
Cervical spondylosis with multilevel disc space narrowing, posterior
disc osteophytes, uncovertebral and facet hypertrophy.

Other neck: Redemonstrated large nodule within the left thyroid lobe
measuring 4.4 x 3.9 cm in transaxial dimensions. By report this has
not significantly changed in size as compared to thyroid ultrasound
[DATE] (previously measuring 4.2 x 3.0 x 4.0 cm). There are
additional nodules more inferiorly within the left thyroid lobe
measuring up to 16 mm not described previously.

Upper chest: No consolidation within the imaged lung apices.

Review of the MIP images confirms the above findings

CTA HEAD FINDINGS

Anterior circulation:

The intracranial internal carotid arteries are patent bilaterally.
Mild calcified plaque within these vessels with no more than mild
stenosis.

The M1 middle cerebral arteries are patent without significant
stenosis. No M2 proximal branch occlusion or high-grade proximal
stenosis is identified. Atherosclerotic irregularity of the M2 and
more distal MCA branch vessels bilaterally. Most notably there are
tandem moderate to moderately advanced stenoses within a proximal M2
left MCA branch vessel (series 16, images 23 and 24).

The anterior cerebral arteries are patent without high-grade
proximal stenosis.

No intracranial aneurysm is identified.

Posterior circulation:

The dominant intracranial right vertebral artery is patent without
significant stenosis. The non dominant left vertebral artery is
developmentally diminutive beyond the origin of the left PICA,
although patent. The basilar artery is patent without significant
stenosis. Fetal origin right posterior cerebral artery. There is
focal occlusion or high-grade stenosis within the proximal right
posterior cerebral artery (series 13, images 75-77). Some flow is
seen more distally within the right posterior cerebral artery,
although this vessel remains markedly diminutive and irregular. The
left posterior cerebral artery is patent without significant
proximal stenosis. The left posterior communicating artery is
hypoplastic or absent.

Venous sinuses: Within limitations of contrast timing, no convincing
thrombus.

Anatomic variants: As described

Review of the MIP images confirms the above findings
IMPRESSION: CT head:

1. No evidence of acute intracranial abnormality.
2. Moderate/advanced chronic small vessel ischemic changes within
the cerebral white matter.
3. Moderate generalized parenchymal atrophy.
4. Mild ethmoid sinus mucosal thickening.

CTA neck:

1. The right common and internal carotid arteries are patent within
the neck. Atherosclerotic plaque results in [DATE]% stenosis of the
proximal right ICA.
2. The origin of the left common carotid artery is excluded from the
field of view. Within this limitation, the left common and internal
carotid arteries are patent within the neck without significant
stenosis. Mild calcified plaque at the left carotid bifurcation.
3. The dominant right vertebral artery is patent throughout the neck
without significant stenosis.
4. The non-dominant left vertebral artery arises directly from the
aortic arch. The origin of this vessel is not included in the field
of view. There is apparent high-grade stenosis within the proximal
V1 segment, although this may be accentuated by motion artifact.
5. A 4.4 cm left thyroid lobe nodule has not significantly changed
in size since thyroid biopsy [DATE] by report. Please correlate
with previous pathology results. However, there are additional
thyroid nodules within the inferior left thyroid lobe measuring up
to 16 mm which were not previously described. Nonemergent thyroid
ultrasound is recommended for further characterization.

CTA head:

1. The right posterior cerebral artery is fetal in origin. There is
a focal occlusion or near-occlusive stenosis proximally within this
vessel. Some flow is seen more distally within the right PCA,
although distally this vessel remains markedly diminutive and
irregular.
2. Atherosclerotic irregularity of the M2 and more distal MCA branch
vessels bilaterally. Most notably, there are tandem moderate to
moderately severe stenoses within a proximal M2 left MCA branch
vessel.
3. Atherosclerotic plaque within the intracranial internal carotid
arteries with no more than mild stenosis.

## 2020-04-21 MED ORDER — STROKE: EARLY STAGES OF RECOVERY BOOK
Freq: Once | Status: DC
  Filled 2020-04-21: qty 1

## 2020-04-21 MED ORDER — IOHEXOL 350 MG/ML SOLN
100.0000 mL | Freq: Once | INTRAVENOUS | Status: AC | PRN
  Administered 2020-04-21: 75 mL via INTRAVENOUS

## 2020-04-21 MED ORDER — ASPIRIN 325 MG PO TABS
325.0000 mg | ORAL_TABLET | Freq: Every day | ORAL | Status: DC
  Administered 2020-04-22 – 2020-04-23 (×2): 325 mg via ORAL
  Filled 2020-04-21 (×2): qty 1

## 2020-04-21 MED ORDER — POTASSIUM CHLORIDE CRYS ER 20 MEQ PO TBCR
20.0000 meq | EXTENDED_RELEASE_TABLET | Freq: Once | ORAL | Status: AC
  Administered 2020-04-22: 20 meq via ORAL
  Filled 2020-04-21: qty 1

## 2020-04-21 MED ORDER — ATORVASTATIN CALCIUM 40 MG PO TABS
40.0000 mg | ORAL_TABLET | Freq: Every day | ORAL | Status: DC
  Administered 2020-04-22 – 2020-04-23 (×2): 40 mg via ORAL
  Filled 2020-04-21 (×2): qty 1

## 2020-04-21 MED ORDER — ACETAMINOPHEN 650 MG RE SUPP: 650.0000 mg | RECTAL | Status: DC | PRN

## 2020-04-21 MED ORDER — ACETAMINOPHEN 160 MG/5ML PO SOLN: 650.0000 mg | ORAL | Status: DC | PRN

## 2020-04-21 MED ORDER — SODIUM CHLORIDE 0.9 % IV SOLN: 1.0000 g | Freq: Every day | INTRAVENOUS | Status: DC

## 2020-04-21 MED ORDER — ENOXAPARIN SODIUM 40 MG/0.4ML ~~LOC~~ SOLN
40.0000 mg | Freq: Every day | SUBCUTANEOUS | Status: DC
  Administered 2020-04-22 (×2): 40 mg via SUBCUTANEOUS
  Filled 2020-04-21 (×2): qty 0.4

## 2020-04-21 MED ORDER — ACETAMINOPHEN 325 MG PO TABS: 650.0000 mg | ORAL_TABLET | ORAL | Status: DC | PRN

## 2020-04-21 MED ORDER — ASPIRIN 81 MG PO CHEW
324.0000 mg | CHEWABLE_TABLET | Freq: Once | ORAL | Status: AC
  Administered 2020-04-22: 324 mg via ORAL
  Filled 2020-04-21: qty 4

## 2020-04-21 NOTE — ED Provider Notes (Signed)
[facility] DEPT Provider Note   CSN: [id number] Arrival date & time: 04/21/20  1839     History Chief Complaint  Patient presents with  . Numbness    Sara Aguirre is a 79 y.o. female.  The history is provided by the patient and medical records. No language interpreter was used.  Neurologic Problem This is a new problem. The current episode started yesterday. The problem occurs constantly. The problem has not changed since onset.Associated symptoms include headaches. Pertinent negatives include no chest pain, no abdominal pain and no shortness of breath. Nothing aggravates the symptoms. Nothing relieves the symptoms. She has tried nothing for the symptoms. The treatment provided no relief.       Past Medical History:  Diagnosis Date  . Cancer [facility])    bladder, s/p cystectomy with ileal conduit  . History of bladder cancer   . History of tobacco use   . Hyperlipidemia   . Hypertension   . Incarcerated ventral hernia, s/p lap repair 12/05/2011  . 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 Ulcer, gastric, acute or chronic     Patient Active Problem List   Diagnosis Date Noted  . Pure hypercholesterolemia 01/17/2017  . Thyroid nodule 08/20/2013  . HTN (hypertension) 08/20/2013  . Body mass index (BMI) of 24.0-24.9 in adult 08/20/2013  . Gastric ulcer 12/29/2011  . Renal insufficiency 12/29/2011  . History of bladder cancer, s/p cystectomy with ileal conduit     Past Surgical History:  Procedure Laterality Date  . BLADDER REMOVAL  10/1993   Dr. Risa Grill  . HERNIA REPAIR  12/05/11   parastomal  . PARASTOMAL HERNIA REPAIR  12/05/2011   Procedure: HERNIA REPAIR PARASTOMAL;  Surgeon: Adin Hector, MD;  Location: [facility] ORS;  Service: General;;  Laprascopic lysis of adhestons with reduction and repair with biological mesh for incarcerated parastomal and ventral long incisional hernia.  Marland Kitchen REVISION UROSTOMY CUTANEOUS     . VENTRAL HERNIA REPAIR  12/05/2011   Procedure: HERNIA REPAIR VENTRAL ADULT;  Surgeon: Adin Hector, MD;  Location: [facility] ORS;  Service: General;;     OB History   No obstetric history on file.     Family History  Problem Relation Age of Onset  . Heart disease Mother 76       CHF  . Alcohol abuse Father   . Stroke Father     Social History   Tobacco Use  . Smoking status: Former Smoker    Quit date: 01/02/1993    Years since quitting: 27.3  . Smokeless tobacco: Never Used  Substance Use Topics  . Alcohol use: No  . Drug use: No    Home Medications Prior to Admission medications   Medication Sig Start Date End Date Taking? Authorizing Provider  amLODipine (NORVASC) 5 MG tablet Take 1 tablet (5 mg total) by mouth daily. 10/27/19   Forrest Moron, MD  atorvastatin (LIPITOR) 20 MG tablet Take 1 tablet (20 mg total) by mouth every other day. 10/28/19   Forrest Moron, MD  Blood Pressure Monitor DEVI Use to check blood pressure 3 times a week 10/27/19   Delia Chimes A, MD  hydrochlorothiazide (HYDRODIURIL) 25 MG tablet Take 1 tablet (25 mg total) by mouth daily. 10/27/19   Forrest Moron, MD  Multiple Vitamin (MULTIVITAMIN) tablet Take 1 tablet by mouth daily.    [provider]  Karen Kays TO [place] [phone] 10/31/18  Forrest Moron, MD    Allergies    Imodium [loperamide] and Loperamide hcl  Review of Systems   Review of Systems  Constitutional: Negative for chills, diaphoresis, fatigue and fever.  HENT: Negative for congestion.   Eyes: Negative for photophobia and visual disturbance.  Respiratory: Negative for cough, chest tightness, shortness of breath and wheezing.   Cardiovascular: Negative for chest pain, palpitations and leg swelling.  Gastrointestinal: Negative for abdominal pain, constipation, diarrhea, nausea and vomiting.  Genitourinary: Negative for dysuria, flank pain and frequency.  Musculoskeletal: Negative for  back pain, neck pain and neck stiffness.  Neurological: Positive for dizziness, numbness and headaches. Negative for tremors, seizures, facial asymmetry, speech difficulty, weakness and light-headedness.  Psychiatric/Behavioral: Negative for agitation and behavioral problems.  All other systems reviewed and are negative.   Physical Exam Updated Vital Signs BP (!) 198/114   Pulse (!) 116   Temp 98.1 F (36.7 C) (Oral)   Resp 20   SpO2 95%   Physical Exam Vitals and nursing note reviewed.  Constitutional:      General: She is not in acute distress.    Appearance: She is well-developed. She is not ill-appearing, toxic-appearing or diaphoretic.  HENT:     Head: Normocephalic and atraumatic.     Nose: Nose normal. No congestion or rhinorrhea.     Mouth/Throat:     Mouth: Mucous membranes are moist.     Pharynx: No oropharyngeal exudate or posterior oropharyngeal erythema.  Eyes:     Extraocular Movements: Extraocular movements intact.     Conjunctiva/sclera: Conjunctivae normal.     Pupils: Pupils are equal, round, and reactive to light.  Neck:     Vascular: Carotid bruit present.  Cardiovascular:     Rate and Rhythm: Regular rhythm. Tachycardia present.     Pulses: Normal pulses.     Heart sounds: Murmur present.  Pulmonary:     Effort: Pulmonary effort is normal. No respiratory distress.     Breath sounds: Normal breath sounds. No wheezing, rhonchi or rales.  Chest:     Chest wall: No tenderness.  Abdominal:     General: Abdomen is flat.     Palpations: Abdomen is soft.     Tenderness: There is no abdominal tenderness. There is no right CVA tenderness, left CVA tenderness, guarding or rebound.  Musculoskeletal:        General: No tenderness.     Cervical back: Neck supple. No rigidity or tenderness.     Right lower leg: No edema.     Left lower leg: No edema.  Skin:    General: Skin is warm and dry.     Findings: No erythema.  Neurological:     Mental Status: She is  alert.     GCS: GCS eye subscore is 4. GCS verbal subscore is 5. GCS motor subscore is 6.     Cranial Nerves: No dysarthria.     Sensory: Sensory deficit present.     Motor: No weakness or abnormal muscle tone.     Coordination: Finger-Nose-Finger Test abnormal.     Comments: Transient ataxia with right finger-nose-finger testing.  Resolved on reassessment.  Persistent numbness in left face and left arm compared to right.  Also subjective dizziness reported with room spinning sensation.  Bruit auscultated on the right.  Psychiatric:        Mood and Affect: Mood normal.     ED Results / Procedures / Treatments   Labs (all labs ordered are  listed, but only abnormal results are displayed) Labs Reviewed  CBC WITH DIFFERENTIAL/PLATELET - Abnormal; Notable for the following components:      Result Value   Monocytes Absolute 1.1 (*)    All other components within normal limits  COMPREHENSIVE METABOLIC PANEL - Abnormal; Notable for the following components:   Potassium 3.4 (*)    Chloride 96 (*)    Glucose, Bld 143 (*)    Creatinine, Ser 1.46 (*)    GFR calc non Af Amer 33 (*)    GFR calc Af Amer 39 (*)    All other components within normal limits  URINALYSIS, ROUTINE W REFLEX MICROSCOPIC - Abnormal; Notable for the following components:   APPearance CLOUDY (*)    Protein, ur 100 (*)    Leukocytes,Ua LARGE (*)    WBC, UA >50 (*)    Bacteria, UA FEW (*)    All other components within normal limits  I-STAT CHEM 8, ED - Abnormal; Notable for the following components:   Potassium 3.3 (*)    Chloride 94 (*)    Creatinine, Ser 1.30 (*)    Glucose, Bld 133 (*)    Hemoglobin 15.6 (*)    All other components within normal limits  RESPIRATORY PANEL BY RT PCR (FLU A&B, COVID)  URINE CULTURE  TSH  MAGNESIUM  HEMOGLOBIN A1C  LIPID PANEL  BASIC METABOLIC PANEL    EKG EKG Interpretation  Date/Time:  Wednesday Apr 21 2020 19:23:57 EDT Ventricular Rate:  84 PR Interval:    QRS  Duration: 100 QT Interval:  374 QTC Calculation: 443 R Axis:   66 Text Interpretation: Sinus rhythm RSR' in V1 or V2, right VCD or RVH When compared to prior, no significant changes seen. No STEMI Confirmed by Antony Blackbird [phone]) on 04/21/2020 8:39:18 PM   Radiology CT Angio Head W or Wo Contrast  Addendum Date: 04/21/2020   ADDENDUM REPORT: 04/21/2020 22:30 ADDENDUM: These results were called by telephone at the time of interpretation on 04/21/2020 at 9:21 pm to provider [facility] , who verbally acknowledged these results. Electronically Signed   By: Kellie Simmering DO   On: 04/21/2020 22:30   Result Date: 04/21/2020 CLINICAL DATA:  Neuro deficit, acute, stroke suspected. Left arm and left face numbness, headache, dizziness since yesterday. Possible right neck bruit. Rule out stroke or vascular abnormality. EXAM: CT ANGIOGRAPHY HEAD AND NECK TECHNIQUE: Multidetector CT imaging of the head and neck was performed using the standard protocol during bolus administration of intravenous contrast. Multiplanar CT image reconstructions and MIPs were obtained to evaluate the vascular anatomy. Carotid stenosis measurements (when applicable) are obtained utilizing NASCET criteria, using the distal internal carotid diameter as the denominator. CONTRAST:  61mL OMNIPAQUE IOHEXOL 350 MG/ML SOLN COMPARISON:  Report from thyroid biopsy 05/24/2017, thyroid ultrasound 01/25/2017 FINDINGS: CT HEAD FINDINGS Brain: There is moderate/advanced ill-defined hypoattenuation within the cerebral white matter which is nonspecific, but consistent with chronic small vessel ischemic disease. Moderate generalized parenchymal atrophy. There is no acute intracranial hemorrhage. No demarcated cortical infarct. No extra-axial fluid collection. No evidence of intracranial mass. No midline shift. Vascular: No hyperdense vessel.  Atherosclerotic calcifications. Skull: Normal. Negative for fracture or focal lesion. Sinuses: Mild ethmoid  sinus mucosal thickening. No significant mastoid effusion. Orbits: No acute abnormality. Review of the MIP images confirms the above findings CTA NECK FINDINGS Aortic arch: Atherosclerotic plaque within the visualized aortic arch and proximal major branch vessels of the neck. The innominate, left common carotid artery and left  vertebral artery origins are not included in the field of view. Right carotid system: The CCA is patent to the bifurcation without measurable stenosis. Calcified plaque within the carotid bifurcation with prominent mixed plaque at the origin of the ECA. Moderate mixed plaque within the proximal ICA with up to 30-40 % stenosis at this site. Distal to this, the ICA is patent within the neck without significant stenosis. Left carotid system: The origin of the left common carotid artery is excluded from the field of view. The CCA is displaced laterally by the large left thyroid nodule. The visualized CCA and ICA are patent within the neck without significant stenosis (50% or greater). Mild calcified plaque within the carotid bifurcation, proximal ICA and proximal ECA. Vertebral arteries: The dominant right vertebral artery is patent throughout the neck without significant stenosis (50% or greater). Mild calcified plaque at the vertebral artery origin and within the distal V2 segment. The non dominant left vertebral artery arises directly from the aortic arch. The origin of this vessel is not included in the field of view. Apparent high-grade stenosis within the proximal V1 left vertebral artery, although this may be accentuated by motion artifact. Skeleton: No acute bony abnormality or aggressive osseous lesion. Cervical spondylosis with multilevel disc space narrowing, posterior disc osteophytes, uncovertebral and facet hypertrophy. Other neck: Redemonstrated large nodule within the left thyroid lobe measuring 4.4 x 3.9 cm in transaxial dimensions. By report this has not significantly changed in  size as compared to thyroid ultrasound 05/24/2017 (previously measuring 4.2 x 3.0 x 4.0 cm). There are additional nodules more inferiorly within the left thyroid lobe measuring up to 16 mm not described previously. Upper chest: No consolidation within the imaged lung apices. Review of the MIP images confirms the above findings CTA HEAD FINDINGS Anterior circulation: The intracranial internal carotid arteries are patent bilaterally. Mild calcified plaque within these vessels with no more than mild stenosis. The M1 middle cerebral arteries are patent without significant stenosis. No M2 proximal branch occlusion or high-grade proximal stenosis is identified. Atherosclerotic irregularity of the M2 and more distal MCA branch vessels bilaterally. Most notably there are tandem moderate to moderately advanced stenoses within a proximal M2 left MCA branch vessel (series 16, images 23 and 24). The anterior cerebral arteries are patent without high-grade proximal stenosis. No intracranial aneurysm is identified. Posterior circulation: The dominant intracranial right vertebral artery is patent without significant stenosis. The non dominant left vertebral artery is developmentally diminutive beyond the origin of the left PICA, although patent. The basilar artery is patent without significant stenosis. Fetal origin right posterior cerebral artery. There is focal occlusion or high-grade stenosis within the proximal right posterior cerebral artery (series 13, images 75-77). Some flow is seen more distally within the right posterior cerebral artery, although this vessel remains markedly diminutive and irregular. The left posterior cerebral artery is patent without significant proximal stenosis. The left posterior communicating artery is hypoplastic or absent. Venous sinuses: Within limitations of contrast timing, no convincing thrombus. Anatomic variants: As described Review of the MIP images confirms the above findings IMPRESSION:  CT head: 1. No evidence of acute intracranial abnormality. 2. Moderate/advanced chronic small vessel ischemic changes within the cerebral white matter. 3. Moderate generalized parenchymal atrophy. 4. Mild ethmoid sinus mucosal thickening. CTA neck: 1. The right common and internal carotid arteries are patent within the neck. Atherosclerotic plaque results in 30-40% stenosis of the proximal right ICA. 2. The origin of the left common carotid artery is excluded from the field  of view. Within this limitation, the left common and internal carotid arteries are patent within the neck without significant stenosis. Mild calcified plaque at the left carotid bifurcation. 3. The dominant right vertebral artery is patent throughout the neck without significant stenosis. 4. The non-dominant left vertebral artery arises directly from the aortic arch. The origin of this vessel is not included in the field of view. There is apparent high-grade stenosis within the proximal V1 segment, although this may be accentuated by motion artifact. 5. A 4.4 cm left thyroid lobe nodule has not significantly changed in size since thyroid biopsy 05/24/2017 by report. Please correlate with previous pathology results. However, there are additional thyroid nodules within the inferior left thyroid lobe measuring up to 16 mm which were not previously described. Nonemergent thyroid ultrasound is recommended for further characterization. CTA head: 1. The right posterior cerebral artery is fetal in origin. There is a focal occlusion or near-occlusive stenosis proximally within this vessel. Some flow is seen more distally within the right PCA, although distally this vessel remains markedly diminutive and irregular. 2. Atherosclerotic irregularity of the M2 and more distal MCA branch vessels bilaterally. Most notably, there are tandem moderate to moderately severe stenoses within a proximal M2 left MCA branch vessel. 3. Atherosclerotic plaque within the  intracranial internal carotid arteries with no more than mild stenosis. Electronically Signed: By: Kellie Simmering DO On: 04/21/2020 21:21   CT Angio Neck W and/or Wo Contrast  Addendum Date: 04/21/2020   ADDENDUM REPORT: 04/21/2020 22:30 ADDENDUM: These results were called by telephone at the time of interpretation on 04/21/2020 at 9:21 pm to provider [facility] , who verbally acknowledged these results. Electronically Signed   By: Kellie Simmering DO   On: 04/21/2020 22:30   Result Date: 04/21/2020 CLINICAL DATA:  Neuro deficit, acute, stroke suspected. Left arm and left face numbness, headache, dizziness since yesterday. Possible right neck bruit. Rule out stroke or vascular abnormality. EXAM: CT ANGIOGRAPHY HEAD AND NECK TECHNIQUE: Multidetector CT imaging of the head and neck was performed using the standard protocol during bolus administration of intravenous contrast. Multiplanar CT image reconstructions and MIPs were obtained to evaluate the vascular anatomy. Carotid stenosis measurements (when applicable) are obtained utilizing NASCET criteria, using the distal internal carotid diameter as the denominator. CONTRAST:  23mL OMNIPAQUE IOHEXOL 350 MG/ML SOLN COMPARISON:  Report from thyroid biopsy 05/24/2017, thyroid ultrasound 01/25/2017 FINDINGS: CT HEAD FINDINGS Brain: There is moderate/advanced ill-defined hypoattenuation within the cerebral white matter which is nonspecific, but consistent with chronic small vessel ischemic disease. Moderate generalized parenchymal atrophy. There is no acute intracranial hemorrhage. No demarcated cortical infarct. No extra-axial fluid collection. No evidence of intracranial mass. No midline shift. Vascular: No hyperdense vessel.  Atherosclerotic calcifications. Skull: Normal. Negative for fracture or focal lesion. Sinuses: Mild ethmoid sinus mucosal thickening. No significant mastoid effusion. Orbits: No acute abnormality. Review of the MIP images confirms the above  findings CTA NECK FINDINGS Aortic arch: Atherosclerotic plaque within the visualized aortic arch and proximal major branch vessels of the neck. The innominate, left common carotid artery and left vertebral artery origins are not included in the field of view. Right carotid system: The CCA is patent to the bifurcation without measurable stenosis. Calcified plaque within the carotid bifurcation with prominent mixed plaque at the origin of the ECA. Moderate mixed plaque within the proximal ICA with up to 30-40 % stenosis at this site. Distal to this, the ICA is patent within the neck without significant stenosis. Left  carotid system: The origin of the left common carotid artery is excluded from the field of view. The CCA is displaced laterally by the large left thyroid nodule. The visualized CCA and ICA are patent within the neck without significant stenosis (50% or greater). Mild calcified plaque within the carotid bifurcation, proximal ICA and proximal ECA. Vertebral arteries: The dominant right vertebral artery is patent throughout the neck without significant stenosis (50% or greater). Mild calcified plaque at the vertebral artery origin and within the distal V2 segment. The non dominant left vertebral artery arises directly from the aortic arch. The origin of this vessel is not included in the field of view. Apparent high-grade stenosis within the proximal V1 left vertebral artery, although this may be accentuated by motion artifact. Skeleton: No acute bony abnormality or aggressive osseous lesion. Cervical spondylosis with multilevel disc space narrowing, posterior disc osteophytes, uncovertebral and facet hypertrophy. Other neck: Redemonstrated large nodule within the left thyroid lobe measuring 4.4 x 3.9 cm in transaxial dimensions. By report this has not significantly changed in size as compared to thyroid ultrasound 05/24/2017 (previously measuring 4.2 x 3.0 x 4.0 cm). There are additional nodules more  inferiorly within the left thyroid lobe measuring up to 16 mm not described previously. Upper chest: No consolidation within the imaged lung apices. Review of the MIP images confirms the above findings CTA HEAD FINDINGS Anterior circulation: The intracranial internal carotid arteries are patent bilaterally. Mild calcified plaque within these vessels with no more than mild stenosis. The M1 middle cerebral arteries are patent without significant stenosis. No M2 proximal branch occlusion or high-grade proximal stenosis is identified. Atherosclerotic irregularity of the M2 and more distal MCA branch vessels bilaterally. Most notably there are tandem moderate to moderately advanced stenoses within a proximal M2 left MCA branch vessel (series 16, images 23 and 24). The anterior cerebral arteries are patent without high-grade proximal stenosis. No intracranial aneurysm is identified. Posterior circulation: The dominant intracranial right vertebral artery is patent without significant stenosis. The non dominant left vertebral artery is developmentally diminutive beyond the origin of the left PICA, although patent. The basilar artery is patent without significant stenosis. Fetal origin right posterior cerebral artery. There is focal occlusion or high-grade stenosis within the proximal right posterior cerebral artery (series 13, images 75-77). Some flow is seen more distally within the right posterior cerebral artery, although this vessel remains markedly diminutive and irregular. The left posterior cerebral artery is patent without significant proximal stenosis. The left posterior communicating artery is hypoplastic or absent. Venous sinuses: Within limitations of contrast timing, no convincing thrombus. Anatomic variants: As described Review of the MIP images confirms the above findings IMPRESSION: CT head: 1. No evidence of acute intracranial abnormality. 2. Moderate/advanced chronic small vessel ischemic changes within  the cerebral white matter. 3. Moderate generalized parenchymal atrophy. 4. Mild ethmoid sinus mucosal thickening. CTA neck: 1. The right common and internal carotid arteries are patent within the neck. Atherosclerotic plaque results in 30-40% stenosis of the proximal right ICA. 2. The origin of the left common carotid artery is excluded from the field of view. Within this limitation, the left common and internal carotid arteries are patent within the neck without significant stenosis. Mild calcified plaque at the left carotid bifurcation. 3. The dominant right vertebral artery is patent throughout the neck without significant stenosis. 4. The non-dominant left vertebral artery arises directly from the aortic arch. The origin of this vessel is not included in the field of view. There is apparent  high-grade stenosis within the proximal V1 segment, although this may be accentuated by motion artifact. 5. A 4.4 cm left thyroid lobe nodule has not significantly changed in size since thyroid biopsy 05/24/2017 by report. Please correlate with previous pathology results. However, there are additional thyroid nodules within the inferior left thyroid lobe measuring up to 16 mm which were not previously described. Nonemergent thyroid ultrasound is recommended for further characterization. CTA head: 1. The right posterior cerebral artery is fetal in origin. There is a focal occlusion or near-occlusive stenosis proximally within this vessel. Some flow is seen more distally within the right PCA, although distally this vessel remains markedly diminutive and irregular. 2. Atherosclerotic irregularity of the M2 and more distal MCA branch vessels bilaterally. Most notably, there are tandem moderate to moderately severe stenoses within a proximal M2 left MCA branch vessel. 3. Atherosclerotic plaque within the intracranial internal carotid arteries with no more than mild stenosis. Electronically Signed: By: Kellie Simmering DO On:  04/21/2020 21:21   MR BRAIN WO CONTRAST  Result Date: 04/21/2020 CLINICAL DATA:  Neuro deficit, acute, stroke suspected. Additional provided: Numbness of left arm and left-sided facial numbness. EXAM: MRI HEAD WITHOUT CONTRAST TECHNIQUE: Multiplanar, multiecho pulse sequences of the brain and surrounding structures were obtained without intravenous contrast. COMPARISON:  CT angiogram head/neck performed earlier the same evening 04/21/2020. FINDINGS: Brain: The examination is intermittently motion degraded. Most notably, there is moderate motion degradation of the axial T2/FLAIR sequence. 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 There are additional patchy small acute infarcts more posteriorly within the medial right temporal lobe and right occipital lobe. Punctate acute infarct within the splenium of the corpus callosum on the right. Additionally, there are small acute lacunar infarcts within the right thalamus. Corresponding T2/FLAIR hyperintensity at these sites. Moderate/advanced patchy and confluent T2/FLAIR hyperintensity within the cerebral white matter which is nonspecific, but consistent with chronic small vessel ischemic disease. Mild chronic small vessel ischemic changes also present within the brainstem. Moderate generalized parenchymal atrophy. No evidence of intracranial mass. No chronic intracranial blood products. No extra-axial fluid collection. No midline shift. Vascular: A focal occlusion or near-occlusive stenosis was demonstrated within the proximal right posterior cerebral artery on CTA performed earlier the same day. Skull and upper cervical spine: No focal marrow lesion. Sinuses/Orbits: Visualized orbits show no acute finding. Mild ethmoid sinus mucosal thickening. No significant mastoid effusion. These results were called by telephone at the time of interpretation on 04/21/2020 at 10:15 pm to provider [facility] , who verbally acknowledged these results. IMPRESSION: 1. Motion degraded examination. 2. 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 Small acute lacunar infarcts are also present within the right thalamus. 3. Background moderate to advanced chronic small vessel ischemic changes predominantly within the cerebral white matter. 4. Moderate generalized parenchymal atrophy. Electronically Signed   By: Kellie Simmering DO   On: 04/21/2020 22:16    Procedures Procedures (including critical care time)  Medications Ordered in ED Medications   stroke: mapping our early stages of recovery book (has no administration in time range)  acetaminophen (TYLENOL) tablet 650 mg (has no administration in time range)    Or  acetaminophen (TYLENOL) 160 MG/5ML solution 650 mg (has no administration in time range)    Or  acetaminophen (TYLENOL) suppository 650 mg (has no administration in time range)  enoxaparin (LOVENOX) injection 40  mg (40 mg Subcutaneous Given 04/22/20 0017)  aspirin tablet 325 mg (has no administration in time range)  atorvastatin (LIPITOR) tablet 40 mg (has no administration in time range)  iohexol (OMNIPAQUE) 350 MG/ML injection 100 mL (75 mLs Intravenous Contrast Given 04/21/20 2019)  potassium chloride SA (KLOR-CON) CR tablet 20 mEq (20 mEq Oral Given 04/22/20 0013)  aspirin chewable tablet 324 mg (324 mg Oral Given 04/22/20 0013)    ED Course  I have reviewed the triage vital signs and the nursing notes.  Pertinent labs & imaging results that were available during my care of the patient were reviewed by me and considered in my medical decision making (see chart for details).    MDM Rules/Calculators/A&P                      Sara Aguirre is a 79 y.o. female with a past medical history significant for hypertension, hyperlipidemia, prior bladder cancer, and  and hypercholesterolemia who presents with neurologic deficit.  Patient is accompanied by son who reports that patient has had some fatigue for the last 2 days.  She reports that yesterday after waking up from a nap, she noticed she was having left facial numbness, left arm numbness, and headache.  She also was feeling dizzy with room spinning sensation.  She reports that she did not feel steady enough to safely drive to the pharmacy so she called her son and asked him to get medications for headache.  She reports her headache is resolved but she is still having some of the dizziness, left-sided facial numbness, and left arm numbness.  She denies any weakness.  She is never had the symptoms before.  She reports that she is been managing her blood pressure with some blood pressure medicine but also with conversion to a plant-based diet.  She has extensive documentation showing her blood pressures which seem to be running between the 140s and 170s fairly consistently.  She denies any vision changes, nausea, or vomiting.  She denies any chest pain, palpitations, shortness of breath.  No fevers, chills, urinary symptoms or GI symptoms.  She denies any neck pain or neck stiffness and denies any recent trauma.  On exam, patient does have numbness in the left lower face and left arm.  Normal strength in the upper extremities and legs.  Normal sensation in the legs.  Patient had normal extraocular movements and clear speech.  Patient had possibly faint ataxia with right finger-nose-finger but resolved on reassessment.  Neck was nontender.  I did feel that I appreciate a bruit in the right neck compared to left.  No neck tenderness.  Exam otherwise unremarkable.  Clinically I am concerned the patient may have had a stroke or evolving TIA.  Due to the abnormality on neck auscultation, will get CT of the head and neck to look for a dissection catering to her symptoms.  With her blood pressures being more labile and blood  pressure in the 190s on arrival, in the setting of headache with neurologic deficit, will get CT scan before proceeding with MRI.  We will get other work-up possible stroke.  On my initial evaluation, pressure has improved into the 170s.  We will continue to monitor and treat if it elevates.  Anticipate reassessment after work-up.          CT showed concern for possible acute stroke.  Neurology was called who agreed.  MRI was obtained showing acute stroke.  Neurology request patient  be admitted to [facility] under the hospitalist service for further work-up of acute stroke.   Final Clinical Impression(s) / ED Diagnoses Final diagnoses:  Cerebrovascular accident (CVA), unspecified mechanism ([place])  Thyroid nodule    Clinical Impression: 1. Cerebrovascular accident (CVA), unspecified mechanism ([place])   2. Thyroid nodule     Disposition: Admit  This note was prepared with assistance of Dragon voice recognition software. Occasional wrong-word or sound-a-like substitutions may have occurred due to the inherent limitations of voice recognition software.     Lorice Lafave, Gwenyth Allegra, MD 04/22/20 [phone]

## 2020-04-21 NOTE — ED Notes (Signed)
Pt. I-stat Chem 8 results potassium 3.3 and RN, Lanelle Bal made aware.

## 2020-04-21 NOTE — ED Triage Notes (Addendum)
Pt brought in by son.  Pt reports taking a nap yesterday evening and waking up with numbness of her left arm and left sided face numbness.    Denies pain  C/o numbness     A/Ox4 Wheelchair in triage.

## 2020-04-21 NOTE — H&P (Addendum)
History and Physical    Sara Aguirre YJE:[id number] DOB: 1941/05/27 DOA: 04/21/2020  PCP: Forrest Moron, MD Patient coming from: Home  Chief Complaint: Numbness, dizziness  HPI: Sara Aguirre is a 79 y.o. 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  Patient states yesterday 5/4 around noon she took a nap and then woke up with numbness on the lower half of her face on the left side and numbness of her left arm.  She did not have any focal weakness or difficulty with speech.  She also had a headache at that time and felt dizzy.  It felt like things were moving.  States the numbness of her face and left arm have now finally resolved.  Denies prior history of stroke.  States she stopped smoking cigarettes a very long time ago.  She was on aspirin a long time ago but has not taken it for the past 8 to 9 years.  Patient states she follows a plant-based diet and does not believe in taking too many medications.  She keeps a log of her home blood pressure readings and sometimes takes half the usual dose of her home blood pressure medication.  Her home blood pressure log showing readings ranging from the 150s to 170s.  Patient has no other complaints.  Denies fevers, cough, shortness of breath, chest pain, nausea, vomiting, or abdominal pain.  ED Course: Afebrile.  Blood pressure elevated with systolic in the 637C to 588F.  Labs showing no leukocytosis.  Potassium 3.4.  Creatinine 1.4, at baseline.  TSH normal.  UA with large amount of leukocytes, greater than 50 WBCs, and few bacteria.  SARS-CoV-2 PCR test pending. CT head negative for acute intracranial abnormality. CT angiogram head showing right posterior cerebral artery which is fetal in the region.  There is a focal occlusion or near occlusive stenosis  proximally within this vessel.  Some flow is seen more distally within the right PCA, although distally this vessel remains markedly diminutive and irregular. Atherosclerotic irregularity of the M2 and more distal MCA branch vessels bilaterally. Most notably, there are tandem moderate to moderately severe stenoses within a proximal M2 left MCA branch vessel. CT angiogram neck showing 30 to 40% stenosis of the proximal right ICA.  High-grade stenosis within the proximal V1 segment of the nondominant left vertebral artery. Brain MRI showing right PCA territory acute infarcts involving the mesial right temporal lobe/hippocampus, medial right temporal lobe more posteriorly, right occipital lobe, and callosal splenium.  Small acute lacunar infarcts are also present within the right thalamus.  Neurology was called (Dr. Rory Percy) and recommended transfer to [facility] for work-up of likely embolic stroke.  Outside TPA window.  Review of Systems:  All systems reviewed and apart from history of presenting illness, are negative.  Past Medical History:  Diagnosis Date  . Cancer [facility])    bladder, s/p cystectomy with ileal conduit  . History of bladder cancer   . History of tobacco use   . Hyperlipidemia   . Hypertension   . Incarcerated ventral hernia, s/p lap repair 12/05/2011  . Parastomal hernia of ileal conduit, s/p lap repair [id number] 12/04/2011  . Ulcer, gastric, acute or chronic     Past Surgical History:  Procedure Laterality Date  . BLADDER REMOVAL  10/1993   Dr. Risa Grill  . HERNIA REPAIR  12/05/11  parastomal  . PARASTOMAL HERNIA REPAIR  12/05/2011   Procedure: HERNIA REPAIR PARASTOMAL;  Surgeon: Adin Hector, MD;  Location: [facility];  Service: General;;  Laprascopic lysis of adhestons with reduction and repair with biological mesh for incarcerated parastomal and ventral long incisional hernia.  Marland Kitchen REVISION UROSTOMY CUTANEOUS    . VENTRAL HERNIA REPAIR  12/05/2011   Procedure: HERNIA REPAIR  VENTRAL ADULT;  Surgeon: Adin Hector, MD;  Location: [facility];  Service: General;;     reports that she quit smoking about 27 years ago. She has never used smokeless tobacco. She reports that she does not drink alcohol or use drugs.  Allergies  Allergen Reactions  . Imodium [Loperamide]     Itchy all over  . Loperamide Hcl Rash    Family History  Problem Relation Age of Onset  . Heart disease Mother 36       CHF  . Alcohol abuse Father   . Stroke Father     Prior to Admission medications   Medication Sig Start Date End Date Taking? Authorizing Provider  amLODipine (NORVASC) 5 MG tablet Take 1 tablet (5 mg total) by mouth daily. 10/27/19  Yes Delia Chimes A, MD  aspirin EC 81 MG tablet Take 81 mg by mouth daily.   Yes [provider]  atorvastatin (LIPITOR) 20 MG tablet Take 1 tablet (20 mg total) by mouth every other day. 10/28/19  Yes Stallings, Zoe A, MD  hydrochlorothiazide (HYDRODIURIL) 25 MG tablet Take 1 tablet (25 mg total) by mouth daily. 10/27/19  Yes Delia Chimes A, MD  Blood Pressure Monitor DEVI Use to check blood pressure 3 times a week 10/27/19   Forrest Moron, MD  UNABLE TO FIND HOLISTER UROSTOMY POUCHES ITEM [phone] 10/31/18   Forrest Moron, MD    Physical Exam: Vitals:   04/21/20 2100 04/21/20 2130 04/21/20 2230 04/21/20 2252  BP: (!) 179/76 (!) 187/107 96/82 (!) 183/89  Pulse: 73 77 81 73  Resp: 15 (!) 25 (!) 22 (!) 24  Temp:      TempSrc:      SpO2: 95% 95% 98% 95%    Physical Exam  Constitutional: She is oriented to person, place, and time. She appears well-developed and well-nourished. No distress.  HENT:  Head: Normocephalic.  Eyes: Right eye exhibits no discharge. Left eye exhibits no discharge.  Cardiovascular: Normal rate, regular rhythm and intact distal pulses.  Pulmonary/Chest: Effort normal and breath sounds normal. No respiratory distress. She has no wheezes. She has no rales.  Abdominal: Soft. Bowel sounds are normal. She  exhibits no distension. There is no abdominal tenderness. There is no guarding.  Musculoskeletal:        General: No edema.     Cervical back: Neck supple.  Neurological: She is alert and oriented to person, place, and time.  Speech fluent, tongue midline Sensation to light touch intact throughout. Strength 5 out of 5 in bilateral upper and lower extremities.  Skin: Skin is warm and dry. She is not diaphoretic.    Labs on Admission: I have personally reviewed following labs and imaging studies  CBC: Recent Labs  Lab 04/21/20 1937 04/21/20 2008  WBC 7.7  --   NEUTROABS 5.6  --   HGB 14.5 15.6*  HCT 45.4 46.0  MCV 91.9  --   PLT 283  --    Basic Metabolic Panel: Recent Labs  Lab 04/21/20 1937 04/21/20 2008 04/21/20 2312  NA 139 140  --  K 3.4* 3.3*  --   CL 96* 94*  --   CO2 31  --   --   GLUCOSE 143* 133*  --   BUN 16 15  --   CREATININE 1.46* 1.30*  --   CALCIUM 9.0  --   --   MG  --   --  2.3   GFR: CrCl cannot be calculated (Unknown ideal weight.). Liver Function Tests: Recent Labs  Lab 04/21/20 1937  AST 16  ALT 12  ALKPHOS 74  BILITOT 0.5  PROT 7.3  ALBUMIN 3.7   No results for input(s): LIPASE, AMYLASE in the last 168 hours. No results for input(s): AMMONIA in the last 168 hours. Coagulation Profile: No results for input(s): INR, PROTIME in the last 168 hours. Cardiac Enzymes: No results for input(s): CKTOTAL, CKMB, CKMBINDEX, TROPONINI in the last 168 hours. BNP (last 3 results) No results for input(s): PROBNP in the last 8760 hours. HbA1C: No results for input(s): HGBA1C in the last 72 hours. CBG: No results for input(s): GLUCAP in the last 168 hours. Lipid Profile: No results for input(s): CHOL, HDL, LDLCALC, TRIG, CHOLHDL, LDLDIRECT in the last 72 hours. Thyroid Function Tests: Recent Labs    04/21/20 1937  TSH 1.081   Anemia Panel: No results for input(s): VITAMINB12, FOLATE, FERRITIN, TIBC, IRON, RETICCTPCT in the last 72  hours. Urine analysis:    Component Value Date/Time   COLORURINE YELLOW 04/21/2020 1916   APPEARANCEUR CLOUDY (A) 04/21/2020 1916   LABSPEC 1.016 04/21/2020 1916   PHURINE 7.0 04/21/2020 1916   GLUCOSEU NEGATIVE 04/21/2020 1916   HGBUR NEGATIVE 04/21/2020 1916   BILIRUBINUR NEGATIVE 04/21/2020 [place] 04/21/2020 1916   PROTEINUR 100 (A) 04/21/2020 1916   UROBILINOGEN 0.2 12/13/2012 2155   NITRITE NEGATIVE 04/21/2020 1916   LEUKOCYTESUR LARGE (A) 04/21/2020 1916    Radiological Exams on Admission: CT Angio Head W or Wo Contrast  Addendum Date: 04/21/2020   ADDENDUM REPORT: 04/21/2020 22:30 ADDENDUM: These results were called by telephone at the time of interpretation on 04/21/2020 at 9:21 pm to provider [facility] , who verbally acknowledged these results. Electronically Signed   By: Kellie Simmering DO   On: 04/21/2020 22:30   Result Date: 04/21/2020 CLINICAL DATA:  Neuro deficit, acute, stroke suspected. Left arm and left face numbness, headache, dizziness since yesterday. Possible right neck bruit. Rule out stroke or vascular abnormality. EXAM: CT ANGIOGRAPHY HEAD AND NECK TECHNIQUE: Multidetector CT imaging of the head and neck was performed using the standard protocol during bolus administration of intravenous contrast. Multiplanar CT image reconstructions and MIPs were obtained to evaluate the vascular anatomy. Carotid stenosis measurements (when applicable) are obtained utilizing NASCET criteria, using the distal internal carotid diameter as the denominator. CONTRAST:  39mL OMNIPAQUE IOHEXOL 350 MG/ML SOLN COMPARISON:  Report from thyroid biopsy 05/24/2017, thyroid ultrasound 01/25/2017 FINDINGS: CT HEAD FINDINGS Brain: There is moderate/advanced ill-defined hypoattenuation within the cerebral white matter which is nonspecific, but consistent with chronic small vessel ischemic disease. Moderate generalized parenchymal atrophy. There is no acute intracranial hemorrhage.  No demarcated cortical infarct. No extra-axial fluid collection. No evidence of intracranial mass. No midline shift. Vascular: No hyperdense vessel.  Atherosclerotic calcifications. Skull: Normal. Negative for fracture or focal lesion. Sinuses: Mild ethmoid sinus mucosal thickening. No significant mastoid effusion. Orbits: No acute abnormality. Review of the MIP images confirms the above findings CTA NECK FINDINGS Aortic arch: Atherosclerotic plaque within the visualized aortic arch and proximal major branch  vessels of the neck. The innominate, left common carotid artery and left vertebral artery origins are not included in the field of view. Right carotid system: The CCA is patent to the bifurcation without measurable stenosis. Calcified plaque within the carotid bifurcation with prominent mixed plaque at the origin of the ECA. Moderate mixed plaque within the proximal ICA with up to 30-40 % stenosis at this site. Distal to this, the ICA is patent within the neck without significant stenosis. Left carotid system: The origin of the left common carotid artery is excluded from the field of view. The CCA is displaced laterally by the large left thyroid nodule. The visualized CCA and ICA are patent within the neck without significant stenosis (50% or greater). Mild calcified plaque within the carotid bifurcation, proximal ICA and proximal ECA. Vertebral arteries: The dominant right vertebral artery is patent throughout the neck without significant stenosis (50% or greater). Mild calcified plaque at the vertebral artery origin and within the distal V2 segment. The non dominant left vertebral artery arises directly from the aortic arch. The origin of this vessel is not included in the field of view. Apparent high-grade stenosis within the proximal V1 left vertebral artery, although this may be accentuated by motion artifact. Skeleton: No acute bony abnormality or aggressive osseous lesion. Cervical spondylosis with  multilevel disc space narrowing, posterior disc osteophytes, uncovertebral and facet hypertrophy. Other neck: Redemonstrated large nodule within the left thyroid lobe measuring 4.4 x 3.9 cm in transaxial dimensions. By report this has not significantly changed in size as compared to thyroid ultrasound 05/24/2017 (previously measuring 4.2 x 3.0 x 4.0 cm). There are additional nodules more inferiorly within the left thyroid lobe measuring up to 16 mm not described previously. Upper chest: No consolidation within the imaged lung apices. Review of the MIP images confirms the above findings CTA HEAD FINDINGS Anterior circulation: The intracranial internal carotid arteries are patent bilaterally. Mild calcified plaque within these vessels with no more than mild stenosis. The M1 middle cerebral arteries are patent without significant stenosis. No M2 proximal branch occlusion or high-grade proximal stenosis is identified. Atherosclerotic irregularity of the M2 and more distal MCA branch vessels bilaterally. Most notably there are tandem moderate to moderately advanced stenoses within a proximal M2 left MCA branch vessel (series 16, images 23 and 24). The anterior cerebral arteries are patent without high-grade proximal stenosis. No intracranial aneurysm is identified. Posterior circulation: The dominant intracranial right vertebral artery is patent without significant stenosis. The non dominant left vertebral artery is developmentally diminutive beyond the origin of the left PICA, although patent. The basilar artery is patent without significant stenosis. Fetal origin right posterior cerebral artery. There is focal occlusion or high-grade stenosis within the proximal right posterior cerebral artery (series 13, images 75-77). Some flow is seen more distally within the right posterior cerebral artery, although this vessel remains markedly diminutive and irregular. The left posterior cerebral artery is patent without  significant proximal stenosis. The left posterior communicating artery is hypoplastic or absent. Venous sinuses: Within limitations of contrast timing, no convincing thrombus. Anatomic variants: As described Review of the MIP images confirms the above findings IMPRESSION: CT head: 1. No evidence of acute intracranial abnormality. 2. Moderate/advanced chronic small vessel ischemic changes within the cerebral white matter. 3. Moderate generalized parenchymal atrophy. 4. Mild ethmoid sinus mucosal thickening. CTA neck: 1. The right common and internal carotid arteries are patent within the neck. Atherosclerotic plaque results in 30-40% stenosis of the proximal right ICA. 2. The  origin of the left common carotid artery is excluded from the field of view. Within this limitation, the left common and internal carotid arteries are patent within the neck without significant stenosis. Mild calcified plaque at the left carotid bifurcation. 3. The dominant right vertebral artery is patent throughout the neck without significant stenosis. 4. The non-dominant left vertebral artery arises directly from the aortic arch. The origin of this vessel is not included in the field of view. There is apparent high-grade stenosis within the proximal V1 segment, although this may be accentuated by motion artifact. 5. A 4.4 cm left thyroid lobe nodule has not significantly changed in size since thyroid biopsy 05/24/2017 by report. Please correlate with previous pathology results. However, there are additional thyroid nodules within the inferior left thyroid lobe measuring up to 16 mm which were not previously described. Nonemergent thyroid ultrasound is recommended for further characterization. CTA head: 1. The right posterior cerebral artery is fetal in origin. There is a focal occlusion or near-occlusive stenosis proximally within this vessel. Some flow is seen more distally within the right PCA, although distally this vessel remains  markedly diminutive and irregular. 2. Atherosclerotic irregularity of the M2 and more distal MCA branch vessels bilaterally. Most notably, there are tandem moderate to moderately severe stenoses within a proximal M2 left MCA branch vessel. 3. Atherosclerotic plaque within the intracranial internal carotid arteries with no more than mild stenosis. Electronically Signed: By: Kellie Simmering DO On: 04/21/2020 21:21   CT Angio Neck W and/or Wo Contrast  Addendum Date: 04/21/2020   ADDENDUM REPORT: 04/21/2020 22:30 ADDENDUM: These results were called by telephone at the time of interpretation on 04/21/2020 at 9:21 pm to provider [facility] , who verbally acknowledged these results. Electronically Signed   By: Kellie Simmering DO   On: 04/21/2020 22:30   Result Date: 04/21/2020 CLINICAL DATA:  Neuro deficit, acute, stroke suspected. Left arm and left face numbness, headache, dizziness since yesterday. Possible right neck bruit. Rule out stroke or vascular abnormality. EXAM: CT ANGIOGRAPHY HEAD AND NECK TECHNIQUE: Multidetector CT imaging of the head and neck was performed using the standard protocol during bolus administration of intravenous contrast. Multiplanar CT image reconstructions and MIPs were obtained to evaluate the vascular anatomy. Carotid stenosis measurements (when applicable) are obtained utilizing NASCET criteria, using the distal internal carotid diameter as the denominator. CONTRAST:  54mL OMNIPAQUE IOHEXOL 350 MG/ML SOLN COMPARISON:  Report from thyroid biopsy 05/24/2017, thyroid ultrasound 01/25/2017 FINDINGS: CT HEAD FINDINGS Brain: There is moderate/advanced ill-defined hypoattenuation within the cerebral white matter which is nonspecific, but consistent with chronic small vessel ischemic disease. Moderate generalized parenchymal atrophy. There is no acute intracranial hemorrhage. No demarcated cortical infarct. No extra-axial fluid collection. No evidence of intracranial mass. No midline shift.  Vascular: No hyperdense vessel.  Atherosclerotic calcifications. Skull: Normal. Negative for fracture or focal lesion. Sinuses: Mild ethmoid sinus mucosal thickening. No significant mastoid effusion. Orbits: No acute abnormality. Review of the MIP images confirms the above findings CTA NECK FINDINGS Aortic arch: Atherosclerotic plaque within the visualized aortic arch and proximal major branch vessels of the neck. The innominate, left common carotid artery and left vertebral artery origins are not included in the field of view. Right carotid system: The CCA is patent to the bifurcation without measurable stenosis. Calcified plaque within the carotid bifurcation with prominent mixed plaque at the origin of the ECA. Moderate mixed plaque within the proximal ICA with up to 30-40 % stenosis at this site. Distal to  this, the ICA is patent within the neck without significant stenosis. Left carotid system: The origin of the left common carotid artery is excluded from the field of view. The CCA is displaced laterally by the large left thyroid nodule. The visualized CCA and ICA are patent within the neck without significant stenosis (50% or greater). Mild calcified plaque within the carotid bifurcation, proximal ICA and proximal ECA. Vertebral arteries: The dominant right vertebral artery is patent throughout the neck without significant stenosis (50% or greater). Mild calcified plaque at the vertebral artery origin and within the distal V2 segment. The non dominant left vertebral artery arises directly from the aortic arch. The origin of this vessel is not included in the field of view. Apparent high-grade stenosis within the proximal V1 left vertebral artery, although this may be accentuated by motion artifact. Skeleton: No acute bony abnormality or aggressive osseous lesion. Cervical spondylosis with multilevel disc space narrowing, posterior disc osteophytes, uncovertebral and facet hypertrophy. Other neck:  Redemonstrated large nodule within the left thyroid lobe measuring 4.4 x 3.9 cm in transaxial dimensions. By report this has not significantly changed in size as compared to thyroid ultrasound 05/24/2017 (previously measuring 4.2 x 3.0 x 4.0 cm). There are additional nodules more inferiorly within the left thyroid lobe measuring up to 16 mm not described previously. Upper chest: No consolidation within the imaged lung apices. Review of the MIP images confirms the above findings CTA HEAD FINDINGS Anterior circulation: The intracranial internal carotid arteries are patent bilaterally. Mild calcified plaque within these vessels with no more than mild stenosis. The M1 middle cerebral arteries are patent without significant stenosis. No M2 proximal branch occlusion or high-grade proximal stenosis is identified. Atherosclerotic irregularity of the M2 and more distal MCA branch vessels bilaterally. Most notably there are tandem moderate to moderately advanced stenoses within a proximal M2 left MCA branch vessel (series 16, images 23 and 24). The anterior cerebral arteries are patent without high-grade proximal stenosis. No intracranial aneurysm is identified. Posterior circulation: The dominant intracranial right vertebral artery is patent without significant stenosis. The non dominant left vertebral artery is developmentally diminutive beyond the origin of the left PICA, although patent. The basilar artery is patent without significant stenosis. Fetal origin right posterior cerebral artery. There is focal occlusion or high-grade stenosis within the proximal right posterior cerebral artery (series 13, images 75-77). Some flow is seen more distally within the right posterior cerebral artery, although this vessel remains markedly diminutive and irregular. The left posterior cerebral artery is patent without significant proximal stenosis. The left posterior communicating artery is hypoplastic or absent. Venous sinuses: Within  limitations of contrast timing, no convincing thrombus. Anatomic variants: As described Review of the MIP images confirms the above findings IMPRESSION: CT head: 1. No evidence of acute intracranial abnormality. 2. Moderate/advanced chronic small vessel ischemic changes within the cerebral white matter. 3. Moderate generalized parenchymal atrophy. 4. Mild ethmoid sinus mucosal thickening. CTA neck: 1. The right common and internal carotid arteries are patent within the neck. Atherosclerotic plaque results in 30-40% stenosis of the proximal right ICA. 2. The origin of the left common carotid artery is excluded from the field of view. Within this limitation, the left common and internal carotid arteries are patent within the neck without significant stenosis. Mild calcified plaque at the left carotid bifurcation. 3. The dominant right vertebral artery is patent throughout the neck without significant stenosis. 4. The non-dominant left vertebral artery arises directly from the aortic arch. The origin of this  vessel is not included in the field of view. There is apparent high-grade stenosis within the proximal V1 segment, although this may be accentuated by motion artifact. 5. A 4.4 cm left thyroid lobe nodule has not significantly changed in size since thyroid biopsy 05/24/2017 by report. Please correlate with previous pathology results. However, there are additional thyroid nodules within the inferior left thyroid lobe measuring up to 16 mm which were not previously described. Nonemergent thyroid ultrasound is recommended for further characterization. CTA head: 1. The right posterior cerebral artery is fetal in origin. There is a focal occlusion or near-occlusive stenosis proximally within this vessel. Some flow is seen more distally within the right PCA, although distally this vessel remains markedly diminutive and irregular. 2. Atherosclerotic irregularity of the M2 and more distal MCA branch vessels bilaterally.  Most notably, there are tandem moderate to moderately severe stenoses within a proximal M2 left MCA branch vessel. 3. Atherosclerotic plaque within the intracranial internal carotid arteries with no more than mild stenosis. Electronically Signed: By: Kellie Simmering DO On: 04/21/2020 21:21   MR BRAIN WO CONTRAST  Result Date: 04/21/2020 CLINICAL DATA:  Neuro deficit, acute, stroke suspected. Additional provided: Numbness of left arm and left-sided facial numbness. EXAM: MRI HEAD WITHOUT CONTRAST TECHNIQUE: Multiplanar, multiecho pulse sequences of the brain and surrounding structures were obtained without intravenous contrast. COMPARISON:  CT angiogram head/neck performed earlier the same evening 04/21/2020. FINDINGS: Brain: The examination is intermittently motion degraded. Most notably, there is moderate motion degradation of the axial T2/FLAIR sequence. 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 There are additional patchy small acute infarcts more posteriorly within the medial right temporal lobe and right occipital lobe. Punctate acute infarct within the splenium of the corpus callosum on the right. Additionally, there are small acute lacunar infarcts within the right thalamus. Corresponding T2/FLAIR hyperintensity at these sites. Moderate/advanced patchy and confluent T2/FLAIR hyperintensity within the cerebral white matter which is nonspecific, but consistent with chronic small vessel ischemic disease. Mild chronic small vessel ischemic changes also present within the brainstem. Moderate generalized parenchymal atrophy. No evidence of intracranial mass. No chronic intracranial blood products. No extra-axial fluid collection. No midline shift. Vascular: A focal occlusion or near-occlusive stenosis was demonstrated within the proximal right posterior cerebral artery on CTA performed earlier the same day. Skull and upper cervical spine: No  focal marrow lesion. Sinuses/Orbits: Visualized orbits show no acute finding. Mild ethmoid sinus mucosal thickening. No significant mastoid effusion. These results were called by telephone at the time of interpretation on 04/21/2020 at 10:15 pm to provider [facility] , who verbally acknowledged these results. IMPRESSION: 1. Motion degraded examination. 2. 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 Small acute lacunar infarcts are also present within the right thalamus. 3. Background moderate to advanced chronic small vessel ischemic changes predominantly within the cerebral white matter. 4. Moderate generalized parenchymal atrophy. Electronically Signed   By: Kellie Simmering DO   On: 04/21/2020 22:16    EKG: Independently reviewed.  Sinus rhythm, no significant change since prior tracing.  Assessment/Plan Principal Problem:   Acute CVA (cerebrovascular accident) ([place]) Active Problems:   Renal insufficiency   HTN (hypertension)   Pure hypercholesterolemia   Hypokalemia   Acute CVA -Patient is presenting with complaints of left-sided facial and arm numbness and dizziness.  Symptom onset 5/4 around noon.  Sensation to light  touch grossly intact at this time and patient states her numbness has resolved. -CT head negative for acute intracranial abnormality. -CT angiogram head showing right posterior cerebral artery which is fetal in the region.  There is a focal occlusion or near occlusive stenosis proximally within this vessel.  Some flow is seen more distally within the right PCA, although distally this vessel remains markedly diminutive and irregular. Atherosclerotic irregularity of the M2 and more distal MCA branch vessels bilaterally. Most notably, there are tandem moderate to moderately severe stenoses within a proximal M2 left MCA branch vessel. -CT angiogram  neck showing 30 to 40% stenosis of the proximal right ICA.  High-grade stenosis within the proximal V1 segment of the nondominant left vertebral artery. -Brain MRI showing right PCA territory acute infarcts involving the mesial right temporal lobe/hippocampus, medial right temporal lobe more posteriorly, right occipital lobe, and callosal splenium.  Small acute lacunar infarcts are also present within the right thalamus. -Dr. Rory Percy recommended transfer to [facility] for further work-up of likely embolic stroke.  Outside TPA window. -Telemetry monitoring -Allow for permissive hypertension-treat only if systolic blood pressures are greater than 220. -2D echocardiogram -Hemoglobin A1c, fasting lipid panel -Aspirin 325 p.o. now and daily -Atorvastatin 40 mg daily -Frequent neurochecks -PT, OT, speech therapy. -N.p.o. until cleared by bedside swallow evaluation or formal speech evaluation  Abnormal urinalysis: UA with large amount of leukocytes, greater than 50 WBCs, and few bacteria.  UTI/pyelonephritis less likely given no fever or leukocytosis.  Patient is nontoxic-appearing.  No complaints of flank pain.  Suspect UA findings are related to contamination from urinary diversion given history of cystectomy with ileal conduit. -Order urine culture  Hypertension: Allow permissive hypertension given acute CVA.  Treat only if systolic blood pressure greater than 220.  Mild hypokalemia: Replete potassium.  Check magnesium level and replete if low.  Continue to monitor electrolytes.  CKD stage III: Creatinine 1.4, at baseline.  Thyroid nodules: CT showing a 4.4 cm left thyroid lobe nodule which has not significantly changed in size since thyroid biopsy 05/24/2017 by report (benign follicular nodule). However, there are additional thyroid nodules within the inferior left thyroid lobe measuring up to 16 mm which were not previously described.  TSH normal. -Thyroid ultrasound ordered for further  evaluation  DVT prophylaxis: Lovenox  Code Status: Patient wishes to be full code. Family Communication: Son at bedside. Consults called: Neurology Disposition Plan: Status is: Inpatient  Remains inpatient appropriate because:Altered mental status and Ongoing diagnostic testing needed not appropriate for outpatient work up   Dispo: The patient is from: Home              Anticipated d/c is to: Home              Anticipated d/c date is: 2 days              Patient currently is not medically stable to d/c.  The medical decision making on this patient was of high complexity and the patient is at high risk for clinical deterioration, therefore this is a level 3 visit.  Shela Leff MD [facility]  If 7PM-7AM, please contact night-coverage [email or web address]  04/21/2020, 11:48 PM

## 2020-04-22 ENCOUNTER — Inpatient Hospital Stay (HOSPITAL_COMMUNITY): Payer: PPO

## 2020-04-22 ENCOUNTER — Other Ambulatory Visit: Payer: Self-pay | Admitting: Family Medicine

## 2020-04-22 DIAGNOSIS — I6389 Other cerebral infarction: Secondary | ICD-10-CM

## 2020-04-22 DIAGNOSIS — I10 Essential (primary) hypertension: Secondary | ICD-10-CM

## 2020-04-22 DIAGNOSIS — I639 Cerebral infarction, unspecified: Secondary | ICD-10-CM

## 2020-04-22 DIAGNOSIS — E782 Mixed hyperlipidemia: Secondary | ICD-10-CM

## 2020-04-22 LAB — URINE CULTURE

## 2020-04-22 LAB — LIPID PANEL
Cholesterol: 194 mg/dL (ref 0–200)
HDL: 49 mg/dL (ref >40)
LDL Cholesterol: 112 mg/dL — ABNORMAL HIGH (ref 0–99)
Total CHOL/HDL Ratio: 4 RATIO
Triglycerides: 166 mg/dL — ABNORMAL HIGH (ref <150)
VLDL: 33 mg/dL (ref 0–40)

## 2020-04-22 LAB — BASIC METABOLIC PANEL
Anion gap: 12 (ref 5–15)
BUN: 15 mg/dL (ref 8–23)
CO2: 31 mmol/L (ref 22–32)
Calcium: 9.3 mg/dL (ref 8.9–10.3)
Chloride: 97 mmol/L — ABNORMAL LOW (ref 98–111)
Creatinine, Ser: 1.35 mg/dL — ABNORMAL HIGH (ref 0.44–1.00)
GFR calc Af Amer: 43 mL/min — ABNORMAL LOW (ref >60)
GFR calc non Af Amer: 37 mL/min — ABNORMAL LOW (ref >60)
Glucose, Bld: 101 mg/dL — ABNORMAL HIGH (ref 70–99)
Potassium: 3.5 mmol/L (ref 3.5–5.1)
Sodium: 140 mmol/L (ref 135–145)

## 2020-04-22 LAB — HEMOGLOBIN A1C
Hgb A1c MFr Bld: 5.9 % — ABNORMAL HIGH (ref 4.8–5.6)
Mean Plasma Glucose: 122.63 mg/dL

## 2020-04-22 LAB — RESPIRATORY PANEL BY RT PCR (FLU A&B, COVID)
Influenza A by PCR: NEGATIVE
Influenza B by PCR: NEGATIVE
SARS Coronavirus 2 by RT PCR: NEGATIVE

## 2020-04-22 LAB — ECHOCARDIOGRAM COMPLETE

## 2020-04-22 IMAGING — US US THYROID
1 series · 12 of 25 positions shown · non-contrast
Comparison: [DATE];

CLINICAL DATA: Prior ultrasound follow-up. Follow-up thyroid
nodules. History of ultrasound-guided fine-needle aspiration of
dominant left-sided thyroid nodule performed [DATE] and
[DATE]

EXAM:
THYROID ULTRASOUND
TECHNIQUE: Ultrasound examination of the thyroid gland and adjacent soft
tissues was performed.

[Series 1: us thyroid · 12 of 86 slices shown]
[im 4/86]
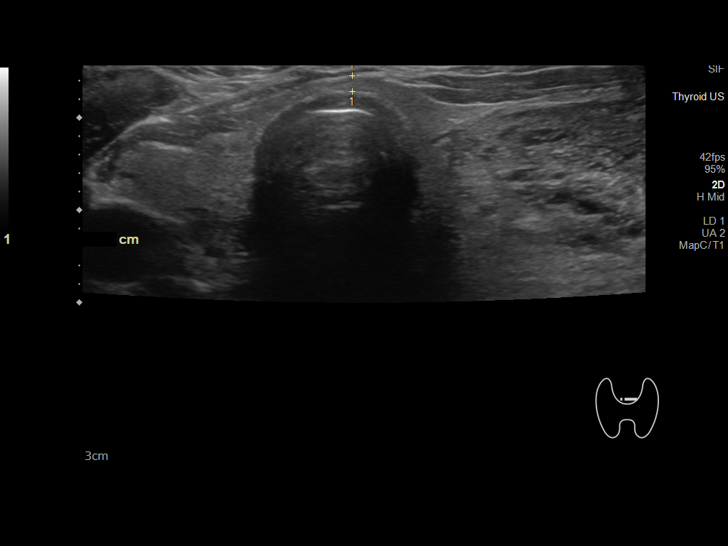
[im 11/86]
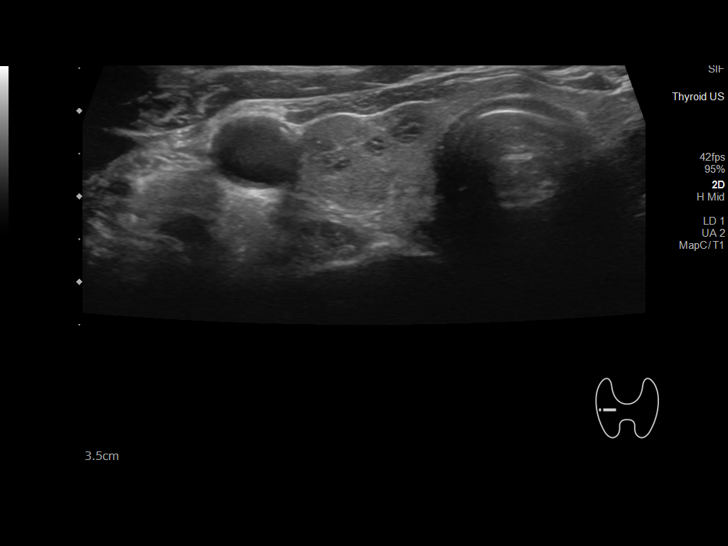
[im 18/86]
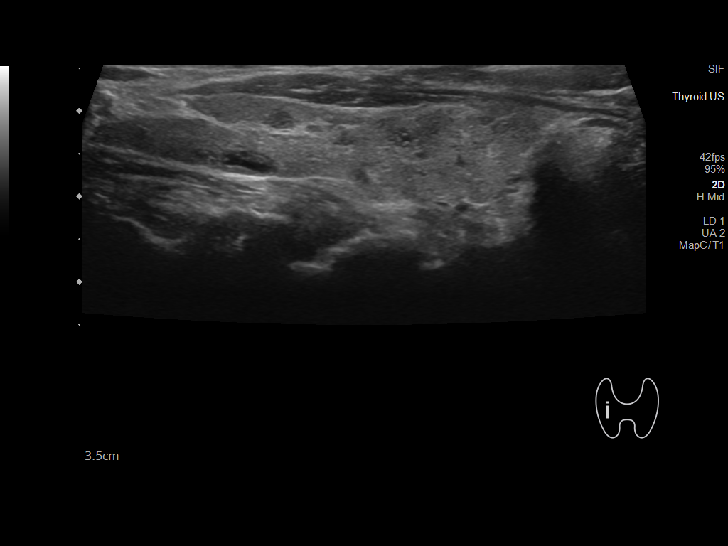
[im 25/86]
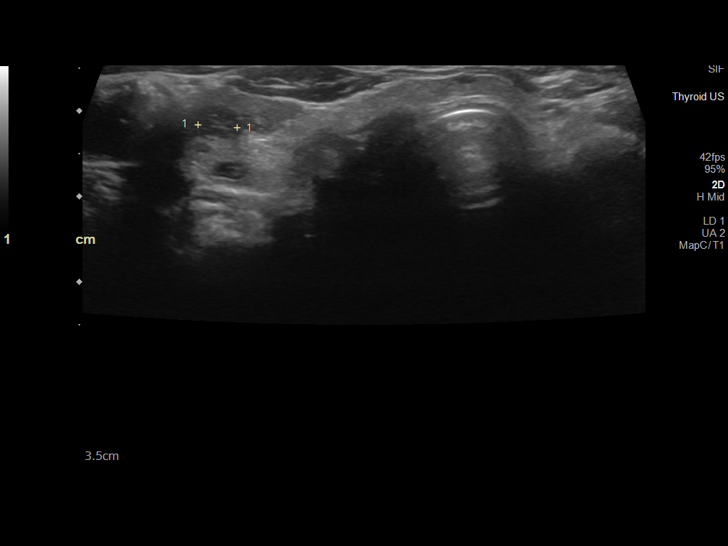
[im 32/86]
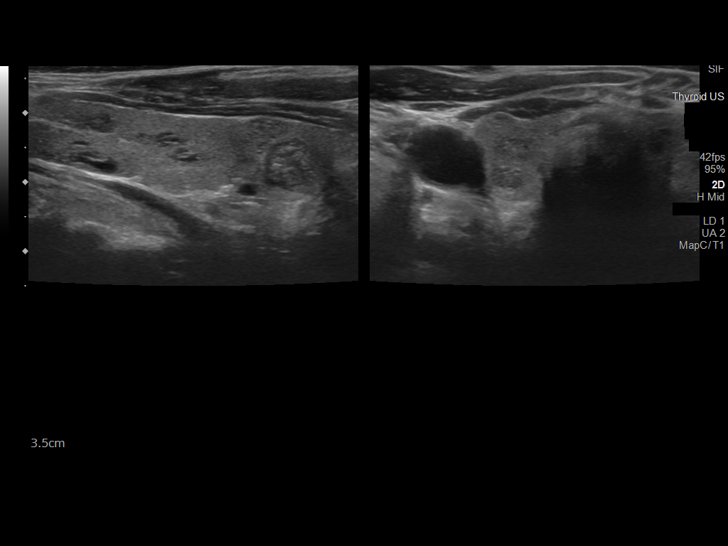
[im 39/86]
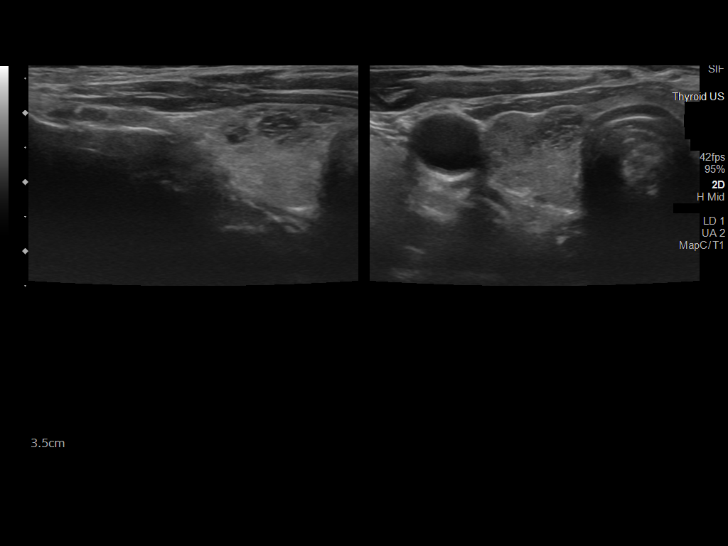
[im 47/86]
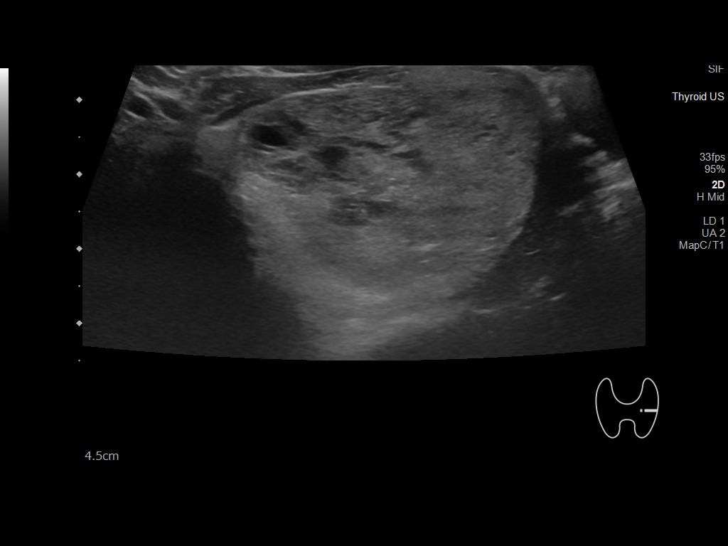
[im 54/86]
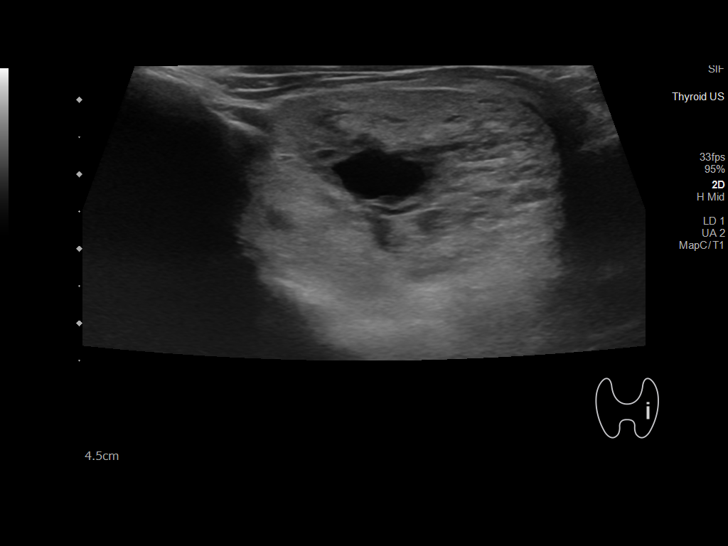
[im 61/86]
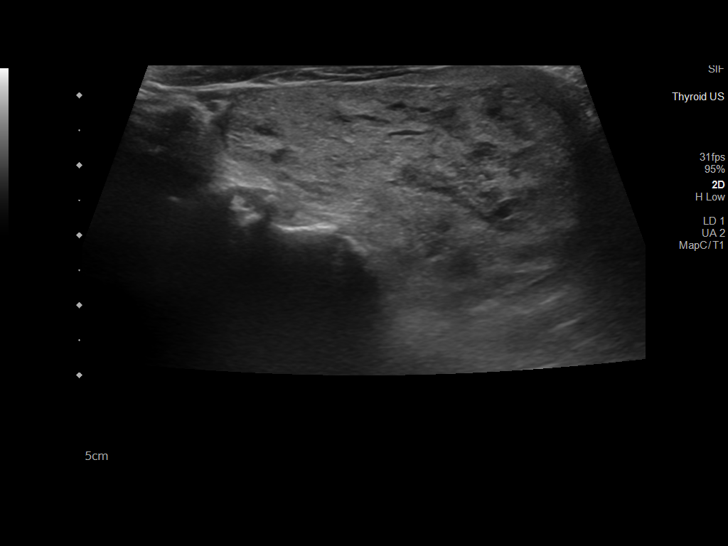
[im 68/86]
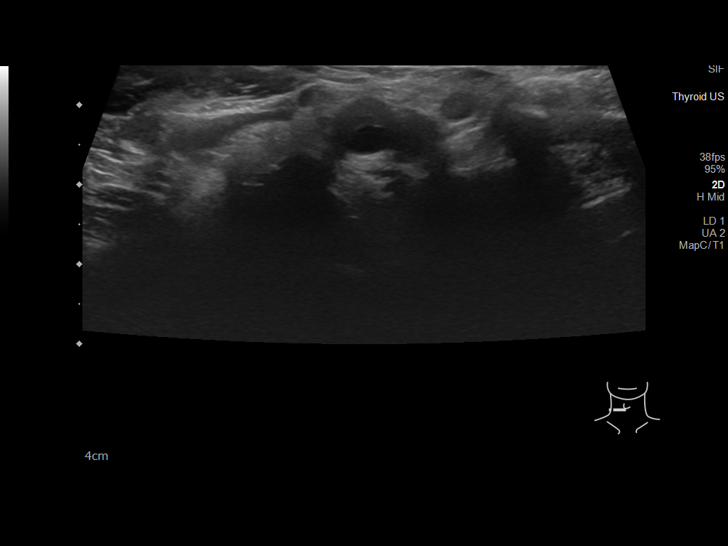
[im 75/86]
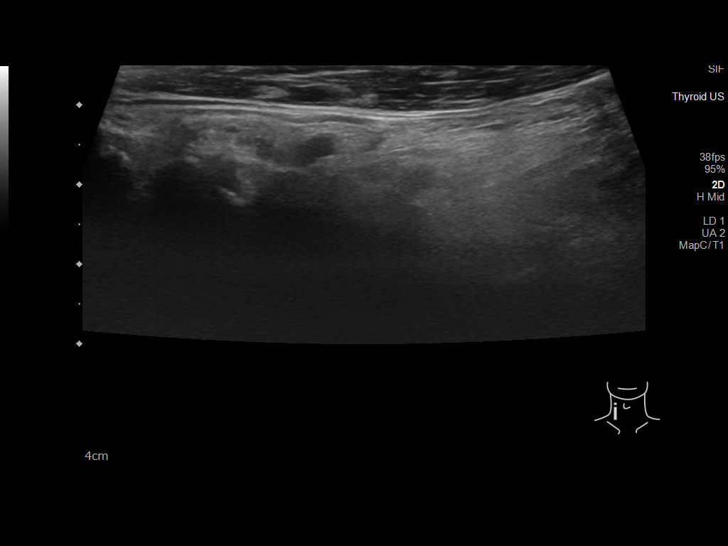
[im 82/86]
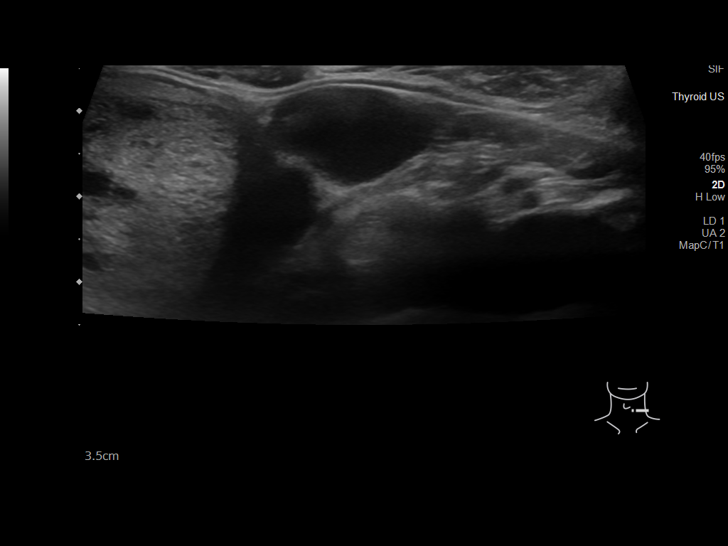

[12 of 25 positions shown; findings below may reference images not displayed]

[DATE]; ultrasound-guided left thyroid
nodule fine-needle aspiration-[DATE]; [DATE]
FINDINGS: Parenchymal Echotexture: Mildly heterogenous

Isthmus: Normal in size measuring 0.2 cm in diameter, unchanged

Right lobe: Normal in size measuring 5.0 x 1.1 x 4.1 cm, previously,
5.0 x 1.4 x 1.9 cm

Left lobe: Borderline enlarged measuring 5.6 x 3.3 x 3.7 cm,
previously, 5.4 x 3.6 x 4.3 cm

_________________________________________________________

Estimated total number of nodules >/= 1 cm: 1

Number of spongiform nodules >/=  2 cm not described below (TR1): 0

Number of mixed cystic and solid nodules >/= 1.5 cm not described
below (TR2): 0

_________________________________________________________

There is an approximately 0.8 cm spongiform/benign-appearing nodule
within the superior pole the right lobe of the thyroid (labeled 1)
which is unchanged compared to the [DATE] examination, previously,
0.7 cm, and again does not meet criteria to recommend percutaneous
sampling or continued dedicated follow-up.

There is an approximately 0.9 cm isoechoic ill-defined nodule within
the inferior pole the right lobe of the thyroid (labeled 2), which
is unchanged compared to the [ZE] examination, previously, 0.8 cm,
and again does not meet criteria to recommend percutaneous sampling
or continued dedicated follow-up.

There is an approximately 0.6 cm spongiform/benign-appearing nodule
within the inferior pole the right lobe of the thyroid (labeled 3),
which is unchanged compared to the [ZE] examination, previously,
cm, and again does not meet criteria to recommend percutaneous
sampling or continued dedicated follow-up

_________________________________________________________

Previously biopsied approximately 5.0 x 2.6 x 4.0 cm isoechoic
partially cystic though predominantly solid mass replacing near the
entirety of the left lobe of the thyroid (labeled 4) is grossly
unchanged compared to the [ZE] examination, previously, 4.5 x 2.7 x
4.0 cm, with slight differences attributable to scan plane
projection.
IMPRESSION: 1. Similar findings of multinodular goiter. No definitive worrisome
new or enlarging thyroid nodules.
2. Previously biopsied approximately 5.0 cm nodule/mass replacing
the left lobe of the thyroid is grossly unchanged compared to the
[ZE] examination. This nodule has been biopsied both in [ZE] as well
as [ZE]. Correlation with previous biopsy results is advised.
Assuming a benign pathologic diagnosis, repeat sampling and/or
continued dedicated follow-up is not recommended.
3. None of the remaining thyroid nodules meet imaging criteria to
recommend percutaneous sampling or continued dedicated follow-up.

The above is in keeping with the ACR TI-RADS recommendations - [HOSPITAL] [ZE];[DATE].

## 2020-04-22 MED ORDER — CLOPIDOGREL BISULFATE 75 MG PO TABS
75.0000 mg | ORAL_TABLET | Freq: Every day | ORAL | Status: DC
  Administered 2020-04-22 – 2020-04-23 (×2): 75 mg via ORAL
  Filled 2020-04-22 (×2): qty 1

## 2020-04-22 NOTE — Evaluation (Addendum)
Physical Therapy Evaluation Patient Details Name: Sara Aguirre MRN: [id number] DOB: 05-Jul-1941 Today's Date: 04/22/2020   History of Present Illness  Sara Aguirre is a 79 y.o. 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   Clinical Impression   Pt presents with generalized weakness and new UE and LE incoordination L>R, impaired dynamic standing balance with DGI score 15/24 reflecting increased risk of falls, tachycardia with exertion, and decreased activity tolerance. Pt to benefit from acute PT to address deficits. Pt ambulated great hallway distance, requiring x1 seated rest break to recover tachycardia 137 bpm, and cuing for posture and foot placement as LLE incoordination prominent during gait. BE FAST CVA s/s reviewed with pt, pt expresses understanding. PT recommending HHPT vs OPPT, depending on if pt's granddaughter can take her to appointments. PT to progress mobility as tolerated, and will continue to follow acutely.      Follow Up Recommendations Home health PT(vs OPPT if granddaughter can take her to appointments)    Equipment Recommendations  None recommended by PT    Recommendations for Other Services       Precautions / Restrictions Precautions Precautions: Fall Precaution Comments: no chair alarm pad on floor, RN aware Restrictions Weight Bearing Restrictions: No      Mobility  Bed Mobility Overal bed mobility: Needs Assistance Bed Mobility: Supine to Sit     Supine to sit: Supervision      General bed mobility comments: supervision for safety, increased time and effort.  Transfers Overall transfer level: Needs assistance   Transfers: Sit to/from Stand Sit to Stand: Min guard         General transfer comment: min guard for safety, briefly reaching for environment to steady  Ambulation/Gait Ambulation/Gait assistance: Min guard Gait Distance (Feet): 400 Feet Assistive device: None Gait Pattern/deviations: Step-through pattern;Decreased stride length;Staggering right;Trunk flexed;Scissoring Gait velocity: decr   General Gait Details: Min guard for safety, verbal cuing for upright posture as pt with forward trunk flexion and R lateral leaning. Pt with occasional near-scissoring secondary to LLE incoordination. HRmax 137 bpm during mobility, recovered with 2 minute seated rest.  Stairs Stairs: Yes Stairs assistance: Min guard Stair Management: One rail Right;Alternating pattern;Forwards Number of Stairs: 10 General stair comments: min guard for safety, verbal cuing for taking her time as pt slightly uncoordinated and with difficulty controlling trajectory of LLE.  Wheelchair Mobility    Modified Rankin (Stroke Patients Only)       Balance Overall balance assessment: Needs assistance Sitting-balance support: No upper extremity supported;Feet supported Sitting balance-Leahy Scale: Good     Standing balance support: No upper extremity supported;During functional activity Standing balance-Leahy Scale: Fair Standing balance comment: no overt LOB, but generally unsteady with near-scissoring of gait                 Standardized Balance Assessment Standardized Balance Assessment : Dynamic Gait Index   Dynamic Gait Index Level Surface: Mild Impairment Change in Gait Speed: Mild Impairment Gait with Horizontal Head Turns: Moderate Impairment Gait with Vertical Head Turns: Mild Impairment Gait and Pivot Turn: Mild Impairment Step  Over Obstacle: Moderate  Impairment Step Around Obstacles: Normal Steps: Mild Impairment Total Score: 15       Pertinent Vitals/Pain Pain Assessment: No/denies pain    Home Living Family/patient expects to be discharged to:: Private residence Living Arrangements: Other relatives(grandaughter) Available Help at Discharge: Family;Available PRN/intermittently(part time in school and part time work) Type of Home: House Home Access: Stairs to enter Entrance Stairs-Rails: None Technical brewer of Steps: 1 Home Layout: One level Home Equipment: Grab bars - tub/shower;Hand held shower head      Prior Function Level of Independence: Independent         Comments: enjoys bowling, gardening, reading, and cooking     Hand Dominance   Dominant Hand: Right    Extremity/Trunk Assessment   Upper Extremity Assessment Upper Extremity Assessment: Defer to OT evaluation    Lower Extremity Assessment Lower Extremity Assessment: Generalized weakness;LLE deficits/detail;RLE deficits/detail RLE Deficits / Details: + incoordination tests- finger-to-nose, heel-to-shin - more coordinated than L LLE Deficits / Details: + incoordination tests- finger-to-nose, heel-to-shin LLE Coordination: decreased gross motor    Cervical / Trunk Assessment Cervical / Trunk Assessment: Normal  Communication   Communication: No difficulties  Cognition Arousal/Alertness: Awake/alert Behavior During Therapy: WFL for tasks assessed/performed Overall Cognitive Status: Within Functional Limits for tasks assessed                                        General Comments General comments (skin integrity, edema, etc.): HRmax 136 bpm    Exercises     Assessment/Plan    PT Assessment Patient needs continued PT services  PT Problem List Decreased strength;Decreased mobility;Decreased safety awareness;Decreased coordination;Decreased balance;Cardiopulmonary status limiting activity       PT Treatment  Interventions DME instruction;Therapeutic activities;Gait training;Therapeutic exercise;Patient/family education;Balance training;Stair training;Functional mobility training;Neuromuscular re-education    PT Goals (Current goals can be found in the Care Plan section)  Acute Rehab PT Goals Patient Stated Goal: to get back home PT Goal Formulation: With patient Time For Goal Achievement: 05/06/20 Potential to Achieve Goals: Good    Frequency Min 4X/week   Barriers to discharge        Co-evaluation               AM-PAC PT "6 Clicks" Mobility  Outcome Measure Help needed turning from your back to your side while in a flat bed without using bedrails?: None Help needed moving from lying on your back to sitting on the side of a flat bed without using bedrails?: None Help needed moving to and from a bed to a chair (including a wheelchair)?: A Little Help needed standing up from a chair using your arms (e.g., wheelchair or bedside chair)?: A Little Help needed to walk in hospital room?: A Little Help needed climbing 3-5 steps with a railing? : A Little 6 Click Score: 20    End of Session Equipment Utilized During Treatment: Gait belt Activity Tolerance: Patient tolerated treatment well Patient left: in bed;with call bell/phone within reach;Other (comment)(SLP in room) Nurse Communication: Mobility status      Time: 7672-0947 PT Time Calculation (min) (ACUTE ONLY): 21 min   Charges:   PT Evaluation $PT Eval Low Complexity: 1 Low         Saramarie Stinger E, PT Acute Rehabilitation Services Pager [phone]  Office [place] Elonda Husky 04/22/2020, 4:58 PM

## 2020-04-22 NOTE — Progress Notes (Signed)
STROKE TEAM PROGRESS NOTE   INTERVAL HISTORY No family is at the bedside.  Pt is sitting in chair for lunch. She denies any heart palpitation or racing heart. She still has left facial, and left fingertip numbness. Left leg numbness resolved. Discussed with pt about potential loop recorder and she agrees.   Vitals:   04/22/20 0200 04/22/20 0230 04/22/20 0300 04/22/20 0348  BP: (!) 158/71 (!) 169/76 (!) 164/73 (!) 180/78  Pulse: 73 76 77 88  Resp: 17 16 16 17   Temp:      TempSrc:      SpO2: 94% 95% 93% 96%    CBC:  Recent Labs  Lab 04/21/20 1937 04/21/20 2008  WBC 7.7  --   NEUTROABS 5.6  --   HGB 14.5 15.6*  HCT 45.4 46.0  MCV 91.9  --   PLT 283  --     Basic Metabolic Panel:  Recent Labs  Lab 04/21/20 1937 04/21/20 2008 04/21/20 2312  NA 139 140  --   K 3.4* 3.3*  --   CL 96* 94*  --   CO2 31  --   --   GLUCOSE 143* 133*  --   BUN 16 15  --   CREATININE 1.46* 1.30*  --   CALCIUM 9.0  --   --   MG  --   --  2.3   Lipid Panel:     Component Value Date/Time   CHOL 194 04/22/2020 0800   CHOL 278 (H) 10/27/2019 1211   TRIG 166 (H) 04/22/2020 0800   HDL 49 04/22/2020 0800   HDL 62 10/27/2019 1211   CHOLHDL 4.0 04/22/2020 0800   VLDL 33 04/22/2020 0800   LDLCALC 112 (H) 04/22/2020 0800   LDLCALC 158 (H) 10/27/2019 1211   HgbA1c:  Lab Results  Component Value Date   HGBA1C 5.9 (H) 04/22/2020   Urine Drug Screen: No results found for: LABOPIA, COCAINSCRNUR, LABBENZ, AMPHETMU, THCU, LABBARB  Alcohol Level No results found for: ETH  IMAGING past 24 hours CT Angio Head W or Wo Contrast  Addendum Date: 04/21/2020   ADDENDUM REPORT: 04/21/2020 22:30 ADDENDUM: These results were called by telephone at the time of interpretation on 04/21/2020 at 9:21 pm to provider [facility] , who verbally acknowledged these results. Electronically Signed   By: Kellie Simmering DO   On: 04/21/2020 22:30   Result Date: 04/21/2020 CLINICAL DATA:  Neuro deficit, acute, stroke  suspected. Left arm and left face numbness, headache, dizziness since yesterday. Possible right neck bruit. Rule out stroke or vascular abnormality. EXAM: CT ANGIOGRAPHY HEAD AND NECK TECHNIQUE: Multidetector CT imaging of the head and neck was performed using the standard protocol during bolus administration of intravenous contrast. Multiplanar CT image reconstructions and MIPs were obtained to evaluate the vascular anatomy. Carotid stenosis measurements (when applicable) are obtained utilizing NASCET criteria, using the distal internal carotid diameter as the denominator. CONTRAST:  54mL OMNIPAQUE IOHEXOL 350 MG/ML SOLN COMPARISON:  Report from thyroid biopsy 05/24/2017, thyroid ultrasound 01/25/2017 FINDINGS: CT HEAD FINDINGS Brain: There is moderate/advanced ill-defined hypoattenuation within the cerebral white matter which is nonspecific, but consistent with chronic small vessel ischemic disease. Moderate generalized parenchymal atrophy. There is no acute intracranial hemorrhage. No demarcated cortical infarct. No extra-axial fluid collection. No evidence of intracranial mass. No midline shift. Vascular: No hyperdense vessel.  Atherosclerotic calcifications. Skull: Normal. Negative for fracture or focal lesion. Sinuses: Mild ethmoid sinus mucosal thickening. No significant mastoid effusion. Orbits: No acute abnormality.  Review of the MIP images confirms the above findings CTA NECK FINDINGS Aortic arch: Atherosclerotic plaque within the visualized aortic arch and proximal major branch vessels of the neck. The innominate, left common carotid artery and left vertebral artery origins are not included in the field of view. Right carotid system: The CCA is patent to the bifurcation without measurable stenosis. Calcified plaque within the carotid bifurcation with prominent mixed plaque at the origin of the ECA. Moderate mixed plaque within the proximal ICA with up to 30-40 % stenosis at this site. Distal to this, the  ICA is patent within the neck without significant stenosis. Left carotid system: The origin of the left common carotid artery is excluded from the field of view. The CCA is displaced laterally by the large left thyroid nodule. The visualized CCA and ICA are patent within the neck without significant stenosis (50% or greater). Mild calcified plaque within the carotid bifurcation, proximal ICA and proximal ECA. Vertebral arteries: The dominant right vertebral artery is patent throughout the neck without significant stenosis (50% or greater). Mild calcified plaque at the vertebral artery origin and within the distal V2 segment. The non dominant left vertebral artery arises directly from the aortic arch. The origin of this vessel is not included in the field of view. Apparent high-grade stenosis within the proximal V1 left vertebral artery, although this may be accentuated by motion artifact. Skeleton: No acute bony abnormality or aggressive osseous lesion. Cervical spondylosis with multilevel disc space narrowing, posterior disc osteophytes, uncovertebral and facet hypertrophy. Other neck: Redemonstrated large nodule within the left thyroid lobe measuring 4.4 x 3.9 cm in transaxial dimensions. By report this has not significantly changed in size as compared to thyroid ultrasound 05/24/2017 (previously measuring 4.2 x 3.0 x 4.0 cm). There are additional nodules more inferiorly within the left thyroid lobe measuring up to 16 mm not described previously. Upper chest: No consolidation within the imaged lung apices. Review of the MIP images confirms the above findings CTA HEAD FINDINGS Anterior circulation: The intracranial internal carotid arteries are patent bilaterally. Mild calcified plaque within these vessels with no more than mild stenosis. The M1 middle cerebral arteries are patent without significant stenosis. No M2 proximal branch occlusion or high-grade proximal stenosis is identified. Atherosclerotic  irregularity of the M2 and more distal MCA branch vessels bilaterally. Most notably there are tandem moderate to moderately advanced stenoses within a proximal M2 left MCA branch vessel (series 16, images 23 and 24). The anterior cerebral arteries are patent without high-grade proximal stenosis. No intracranial aneurysm is identified. Posterior circulation: The dominant intracranial right vertebral artery is patent without significant stenosis. The non dominant left vertebral artery is developmentally diminutive beyond the origin of the left PICA, although patent. The basilar artery is patent without significant stenosis. Fetal origin right posterior cerebral artery. There is focal occlusion or high-grade stenosis within the proximal right posterior cerebral artery (series 13, images 75-77). Some flow is seen more distally within the right posterior cerebral artery, although this vessel remains markedly diminutive and irregular. The left posterior cerebral artery is patent without significant proximal stenosis. The left posterior communicating artery is hypoplastic or absent. Venous sinuses: Within limitations of contrast timing, no convincing thrombus. Anatomic variants: As described Review of the MIP images confirms the above findings IMPRESSION: CT head: 1. No evidence of acute intracranial abnormality. 2. Moderate/advanced chronic small vessel ischemic changes within the cerebral white matter. 3. Moderate generalized parenchymal atrophy. 4. Mild ethmoid sinus mucosal thickening. CTA neck: 1.  The right common and internal carotid arteries are patent within the neck. Atherosclerotic plaque results in 30-40% stenosis of the proximal right ICA. 2. The origin of the left common carotid artery is excluded from the field of view. Within this limitation, the left common and internal carotid arteries are patent within the neck without significant stenosis. Mild calcified plaque at the left carotid bifurcation. 3. The  dominant right vertebral artery is patent throughout the neck without significant stenosis. 4. The non-dominant left vertebral artery arises directly from the aortic arch. The origin of this vessel is not included in the field of view. There is apparent high-grade stenosis within the proximal V1 segment, although this may be accentuated by motion artifact. 5. A 4.4 cm left thyroid lobe nodule has not significantly changed in size since thyroid biopsy 05/24/2017 by report. Please correlate with previous pathology results. However, there are additional thyroid nodules within the inferior left thyroid lobe measuring up to 16 mm which were not previously described. Nonemergent thyroid ultrasound is recommended for further characterization. CTA head: 1. The right posterior cerebral artery is fetal in origin. There is a focal occlusion or near-occlusive stenosis proximally within this vessel. Some flow is seen more distally within the right PCA, although distally this vessel remains markedly diminutive and irregular. 2. Atherosclerotic irregularity of the M2 and more distal MCA branch vessels bilaterally. Most notably, there are tandem moderate to moderately severe stenoses within a proximal M2 left MCA branch vessel. 3. Atherosclerotic plaque within the intracranial internal carotid arteries with no more than mild stenosis. Electronically Signed: By: Kellie Simmering DO On: 04/21/2020 21:21   CT Angio Neck W and/or Wo Contrast  Addendum Date: 04/21/2020   ADDENDUM REPORT: 04/21/2020 22:30 ADDENDUM: These results were called by telephone at the time of interpretation on 04/21/2020 at 9:21 pm to provider [facility] , who verbally acknowledged these results. Electronically Signed   By: Kellie Simmering DO   On: 04/21/2020 22:30   Result Date: 04/21/2020 CLINICAL DATA:  Neuro deficit, acute, stroke suspected. Left arm and left face numbness, headache, dizziness since yesterday. Possible right neck bruit. Rule out stroke  or vascular abnormality. EXAM: CT ANGIOGRAPHY HEAD AND NECK TECHNIQUE: Multidetector CT imaging of the head and neck was performed using the standard protocol during bolus administration of intravenous contrast. Multiplanar CT image reconstructions and MIPs were obtained to evaluate the vascular anatomy. Carotid stenosis measurements (when applicable) are obtained utilizing NASCET criteria, using the distal internal carotid diameter as the denominator. CONTRAST:  24mL OMNIPAQUE IOHEXOL 350 MG/ML SOLN COMPARISON:  Report from thyroid biopsy 05/24/2017, thyroid ultrasound 01/25/2017 FINDINGS: CT HEAD FINDINGS Brain: There is moderate/advanced ill-defined hypoattenuation within the cerebral white matter which is nonspecific, but consistent with chronic small vessel ischemic disease. Moderate generalized parenchymal atrophy. There is no acute intracranial hemorrhage. No demarcated cortical infarct. No extra-axial fluid collection. No evidence of intracranial mass. No midline shift. Vascular: No hyperdense vessel.  Atherosclerotic calcifications. Skull: Normal. Negative for fracture or focal lesion. Sinuses: Mild ethmoid sinus mucosal thickening. No significant mastoid effusion. Orbits: No acute abnormality. Review of the MIP images confirms the above findings CTA NECK FINDINGS Aortic arch: Atherosclerotic plaque within the visualized aortic arch and proximal major branch vessels of the neck. The innominate, left common carotid artery and left vertebral artery origins are not included in the field of view. Right carotid system: The CCA is patent to the bifurcation without measurable stenosis. Calcified plaque within the carotid bifurcation with prominent mixed  plaque at the origin of the ECA. Moderate mixed plaque within the proximal ICA with up to 30-40 % stenosis at this site. Distal to this, the ICA is patent within the neck without significant stenosis. Left carotid system: The origin of the left common carotid  artery is excluded from the field of view. The CCA is displaced laterally by the large left thyroid nodule. The visualized CCA and ICA are patent within the neck without significant stenosis (50% or greater). Mild calcified plaque within the carotid bifurcation, proximal ICA and proximal ECA. Vertebral arteries: The dominant right vertebral artery is patent throughout the neck without significant stenosis (50% or greater). Mild calcified plaque at the vertebral artery origin and within the distal V2 segment. The non dominant left vertebral artery arises directly from the aortic arch. The origin of this vessel is not included in the field of view. Apparent high-grade stenosis within the proximal V1 left vertebral artery, although this may be accentuated by motion artifact. Skeleton: No acute bony abnormality or aggressive osseous lesion. Cervical spondylosis with multilevel disc space narrowing, posterior disc osteophytes, uncovertebral and facet hypertrophy. Other neck: Redemonstrated large nodule within the left thyroid lobe measuring 4.4 x 3.9 cm in transaxial dimensions. By report this has not significantly changed in size as compared to thyroid ultrasound 05/24/2017 (previously measuring 4.2 x 3.0 x 4.0 cm). There are additional nodules more inferiorly within the left thyroid lobe measuring up to 16 mm not described previously. Upper chest: No consolidation within the imaged lung apices. Review of the MIP images confirms the above findings CTA HEAD FINDINGS Anterior circulation: The intracranial internal carotid arteries are patent bilaterally. Mild calcified plaque within these vessels with no more than mild stenosis. The M1 middle cerebral arteries are patent without significant stenosis. No M2 proximal branch occlusion or high-grade proximal stenosis is identified. Atherosclerotic irregularity of the M2 and more distal MCA branch vessels bilaterally. Most notably there are tandem moderate to moderately  advanced stenoses within a proximal M2 left MCA branch vessel (series 16, images 23 and 24). The anterior cerebral arteries are patent without high-grade proximal stenosis. No intracranial aneurysm is identified. Posterior circulation: The dominant intracranial right vertebral artery is patent without significant stenosis. The non dominant left vertebral artery is developmentally diminutive beyond the origin of the left PICA, although patent. The basilar artery is patent without significant stenosis. Fetal origin right posterior cerebral artery. There is focal occlusion or high-grade stenosis within the proximal right posterior cerebral artery (series 13, images 75-77). Some flow is seen more distally within the right posterior cerebral artery, although this vessel remains markedly diminutive and irregular. The left posterior cerebral artery is patent without significant proximal stenosis. The left posterior communicating artery is hypoplastic or absent. Venous sinuses: Within limitations of contrast timing, no convincing thrombus. Anatomic variants: As described Review of the MIP images confirms the above findings IMPRESSION: CT head: 1. No evidence of acute intracranial abnormality. 2. Moderate/advanced chronic small vessel ischemic changes within the cerebral white matter. 3. Moderate generalized parenchymal atrophy. 4. Mild ethmoid sinus mucosal thickening. CTA neck: 1. The right common and internal carotid arteries are patent within the neck. Atherosclerotic plaque results in 30-40% stenosis of the proximal right ICA. 2. The origin of the left common carotid artery is excluded from the field of view. Within this limitation, the left common and internal carotid arteries are patent within the neck without significant stenosis. Mild calcified plaque at the left carotid bifurcation. 3. The dominant right vertebral  artery is patent throughout the neck without significant stenosis. 4. The non-dominant left vertebral  artery arises directly from the aortic arch. The origin of this vessel is not included in the field of view. There is apparent high-grade stenosis within the proximal V1 segment, although this may be accentuated by motion artifact. 5. A 4.4 cm left thyroid lobe nodule has not significantly changed in size since thyroid biopsy 05/24/2017 by report. Please correlate with previous pathology results. However, there are additional thyroid nodules within the inferior left thyroid lobe measuring up to 16 mm which were not previously described. Nonemergent thyroid ultrasound is recommended for further characterization. CTA head: 1. The right posterior cerebral artery is fetal in origin. There is a focal occlusion or near-occlusive stenosis proximally within this vessel. Some flow is seen more distally within the right PCA, although distally this vessel remains markedly diminutive and irregular. 2. Atherosclerotic irregularity of the M2 and more distal MCA branch vessels bilaterally. Most notably, there are tandem moderate to moderately severe stenoses within a proximal M2 left MCA branch vessel. 3. Atherosclerotic plaque within the intracranial internal carotid arteries with no more than mild stenosis. Electronically Signed: By: Kellie Simmering DO On: 04/21/2020 21:21   MR BRAIN WO CONTRAST  Result Date: 04/21/2020 CLINICAL DATA:  Neuro deficit, acute, stroke suspected. Additional provided: Numbness of left arm and left-sided facial numbness. EXAM: MRI HEAD WITHOUT CONTRAST TECHNIQUE: Multiplanar, multiecho pulse sequences of the brain and surrounding structures were obtained without intravenous contrast. COMPARISON:  CT angiogram head/neck performed earlier the same evening 04/21/2020. FINDINGS: Brain: The examination is intermittently motion degraded. Most notably, there is moderate motion degradation of the axial T2/FLAIR sequence. There is restricted diffusion consistent with acute infarction within the mesial right  temporal lobe/hippocampus. There are additional patchy small acute infarcts more posteriorly within the medial right temporal lobe and right occipital lobe. Punctate acute infarct within the splenium of the corpus callosum on the right. Additionally, there are small acute lacunar infarcts within the right thalamus. Corresponding T2/FLAIR hyperintensity at these sites. Moderate/advanced patchy and confluent T2/FLAIR hyperintensity within the cerebral white matter which is nonspecific, but consistent with chronic small vessel ischemic disease. Mild chronic small vessel ischemic changes also present within the brainstem. Moderate generalized parenchymal atrophy. No evidence of intracranial mass. No chronic intracranial blood products. No extra-axial fluid collection. No midline shift. Vascular: A focal occlusion or near-occlusive stenosis was demonstrated within the proximal right posterior cerebral artery on CTA performed earlier the same day. Skull and upper cervical spine: No focal marrow lesion. Sinuses/Orbits: Visualized orbits show no acute finding. Mild ethmoid sinus mucosal thickening. No significant mastoid effusion. These results were called by telephone at the time of interpretation on 04/21/2020 at 10:15 pm to provider [facility] , who verbally acknowledged these results. IMPRESSION: 1. Motion degraded examination. 2. 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 Small acute lacunar infarcts are also present within the right thalamus. 3. Background moderate to advanced chronic small vessel ischemic changes predominantly within the cerebral white matter. 4. Moderate generalized parenchymal atrophy. Electronically Signed   By: Kellie Simmering DO   On: 04/21/2020 22:16    PHYSICAL EXAM  Temp:  [98.1 F (36.7 C)-98.2 F (36.8 C)] 98.2 F (36.8 C) (05/06 0909) Pulse  Rate:  [69-116] 88 (05/06 0909) Resp:  [15-25] 18 (05/06 0909) BP: (96-198)/(66-114) 156/70 (05/06 0909) SpO2:  [93 %-98 %]  93 % (05/06 0909)  General - Well nourished, well developed, in no apparent distress.  Ophthalmologic - fundi not visualized due to noncooperation.  Cardiovascular - Regular rhythm and rate.  Mental Status -  Level of arousal and orientation to time, place, and person were intact. Language including expression, naming, repetition, comprehension was assessed and found intact.  Cranial Nerves II - XII - II - Visual field left upper quadrantanopia  III, IV, VI - Extraocular movements intact. V - Facial sensation decreased on the left, about 90% of the right. VII - Facial movement intact bilaterally. VIII - Hearing & vestibular intact bilaterally. X - Palate elevates symmetrically. XI - Chin turning & shoulder shrug intact bilaterally. XII - Tongue protrusion intact.  Motor Strength - The patient's strength was normal in all extremities and pronator drift was absent.  Bulk was normal and fasciculations were absent.   Motor Tone - Muscle tone was assessed at the neck and appendages and was normal.  Reflexes - The patient's reflexes were symmetrical in all extremities and she had no pathological reflexes.  Sensory - Light touch, temperature/pinprick were assessed and were decreased on the left UE, about 90% of the right.    Coordination - The patient had normal movements in the hands with no ataxia or dysmetria.  Tremor was absent.  Gait and Station - deferred.   ASSESSMENT/PLAN [name] is a 79 y.o. 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  Stroke:   R PCA including R thalamic infarcts, embolic secondary to unknown source  CT head No acute abnormality.  Moderate Small  vessel disease cerebral white matter. Atrophy. Sinus dz.     CTA head fetal R PCA w/ occlusion/near occlusive stenosis. B M2 stenosis w/ severe stenoses proximal L M2. B ICA w/ mild atherosclerosis.   CTA neck R ICA  30-40% stenosis. L ICA w/ mild plaque (origin not seen). Proximal L V1 high-grade stenosis vs artifact.  MRI  R PCA territory infarcts. Small R thalamic infarcts. Small vessel disease. Atrophy.   LE Doppler no DVT  2D Echo EF 55-60%, no RLS  [place] electrophysiologist will consult and consider placement of an implantable loop recorder to evaluate for atrial fibrillation as etiology of stroke if LE doppler neg. This has been explained to patient/family by Dr. Erlinda Hong and they are agreeable.   LDL 112  HgbA1c 5.9  Lovenox 40 mg sq daily for VTE prophylaxis  aspirin 81 mg daily prior to admission, now on aspirin 325 mg daily and clopidogrel 75 mg daily. Continue DAPT x 3 months then plavix alone given PCA occlusion   Therapy recommendations:  pending   Disposition:  pending   Hypertensive Urgency  BP as high as 198/114   Stable now . Permissive hypertension (OK if < 220/120) but gradually normalize in 2-3 days . 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 67 old, several new. [place] pending  Hospital day # 1  Rosalin Hawking, MD PhD Stroke Neurology 04/22/2020 6:57 PM    To contact Stroke Continuity provider, please refer to [email or web address] After hours, contact General Neurology

## 2020-04-22 NOTE — Progress Notes (Signed)
Occupational Therapy Evaluation and sign off Patient Details Name: Sara Aguirre MRN: [id number] DOB: [birth year] Today's Date: 04/22/2020   Clinical Impression: PTA Pt independent in ADL and mobility. Today Pt presents at baseline for ADL and functional transfers. She was able to perform bed mobility, toilet transfer, peri care, sink level grooming at supervision level (initially min guard due to how long she'd been supine for safety). No LOB (although she did use furniture for support initially with mobility). Able to don/soff socks. MMT was symmetrical, sensation intact on LUE (face is still experiencing a little numbness on L side) Able to read from menu, and see far away, manipulate fine motor tasks while standing at sink. Pt able to recall signs and symptoms of CVA. OT to sign off at this time as education complete and Pt at baseline for ADL.     04/22/20 0900  OT Visit Information  Last OT Received On 04/22/20  Assistance Needed +1  History of Present Illness Sara Aguirre is a 79 y.o. 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   Precautions  Precautions Fall  Precaution Comments no chair alarm pad on floor, RN aware  Restrictions  Weight Bearing Restrictions No  Home Living  Family/patient expects to be discharged to: Private residence  Living Arrangements Other relatives Advertising account executive)  Available Help at Discharge Family;Available PRN/intermittently (part time in school and part time work)  Type of [place] to enter  Entrance Stairs-Number of Steps 1  [place] One level  Bathroom Shower/Tub Tub/shower unit  [place] height  Bathroom Accessibility Yes  How Accessible Accessible via [place]  [place] bars - tub/shower;Hand held shower head  Prior Function  Level of Independence Independent  Comments enjoys bowling, gardening, reading, and cooking  Communication  Communication No difficulties  Pain Assessment  Pain Assessment No/denies pain  Cognition  Arousal/Alertness Awake/alert  Behavior During Therapy WFL for tasks assessed/performed  Overall Cognitive Status Within Functional Limits for tasks assessed  Upper Extremity Assessment  Upper Extremity Assessment Overall WFL for tasks assessed (no sensation differences, no stregnth or drift problems)  Lower Extremity Assessment  Lower Extremity Assessment Defer to PT evaluation  Cervical / Trunk Assessment  Cervical / Trunk Assessment Normal  ADL  Overall ADL's  At baseline  General ADL Comments able to complete all ADL without physical assist, fine motor tasks (opening containers), standing balance, dynamic balance for functional ADL - all performed at supervision level due to hospital setting  Vision- History  Baseline Vision/History Wears glasses  Wears Glasses Reading only  Patient Visual Report No change from baseline  Vision- Assessment  Additional Comments able to read from menu, read across the room, find items  Bed Mobility  Overal bed mobility Modified Independent  Transfers  Overall transfer level Needs assistance  Transfers Sit to/from Stand  Sit to Stand Min guard  General transfer comment for safety  Balance  Overall balance assessment Mild deficits observed, not formally tested  OT - End of Session  Equipment Utilized During  Treatment Gait belt  Activity Tolerance Patient tolerated treatment well  Patient left in chair;with call  bell/phone within reach;with chair alarm set  Nurse Communication Mobility status  OT Assessment  OT Recommendation/Assessment Patient does not need any further OT services  OT Visit Diagnosis Other abnormalities of gait and mobility (R26.89)  OT Problem List Impaired balance (sitting and/or standing);Impaired sensation;Impaired UE functional use  AM-PAC OT "6 Clicks" Daily Activity Outcome Measure (Version 2)  Help from another person eating meals? 4  Help from another person taking care of personal grooming? 4  Help from another person toileting, which includes using toliet, bedpan, or urinal? 4  Help from another person bathing (including washing, rinsing, drying)? 4  Help from another person to put on and taking off regular upper body clothing? 4  Help from another person to put on and taking off regular lower body clothing? 4  6 Click Score 24  OT Recommendation  Follow Up Recommendations No OT follow up;Supervision - Intermittent  OT Equipment None recommended by OT  Acute Rehab OT Goals  Patient Stated Goal to get back home after all tests  OT Goal Formulation With patient  Time For Goal Achievement 05/06/20  Potential to Achieve Goals Good  OT Time Calculation  OT Start Time (ACUTE ONLY) 1036  OT Stop Time (ACUTE ONLY) 1059  OT Time Calculation (min) 23 min  OT General Charges  $OT Visit 1 Visit  OT Evaluation  $OT Eval Moderate Complexity 1 Mod  OT Treatments  $Self Care/Home Management  8-22 mins  Written Expression  Dominant Hand Right   Jesse Sans OTR/L Acute Rehabilitation Services Pager: [phone] Office: [phone]

## 2020-04-22 NOTE — Progress Notes (Signed)
  Echocardiogram 2D Echocardiogram has been performed.  Darlina Sicilian M 04/22/2020, 10:22 AM

## 2020-04-22 NOTE — Progress Notes (Signed)
PT Cancellation Note  Patient Details Name: Sara Aguirre MRN: [id number] DOB: 02-07-1941   Cancelled Treatment:    Reason Eval/Treat Not Completed: Patient at procedure or test/unavailable - pt at echo, will check back as schedule allows.  [place] Pager [phone]  Office [place] 04/22/2020, 2:33 PM

## 2020-04-22 NOTE — Consult Note (Signed)
Neurology Consultation  Reason for Consult: Numbness and dizziness Referring Physician: Dr. Marda Stalker, ED provider at [facility]  CC: Dizziness and numbness  History is obtained from: Patient, chart  HPI: Sara Aguirre is a 79 y.o. 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 She reports last known normal around 12 PM on 04/21/2019 and that was before she went for a nap.  When she woke up from a nap, she felt numbness on the left side of the face and some tingling-like sensation on the left arm.  Denies any headache.  Denies any focal weakness.  Denies clumsiness.  Symptoms did not resolve and that made her come to the emergency room for further evaluation. Noncontrast head CT was unremarkable. CTA with a right PCA slow flow versus clot-this was followed by an MRI that showed scattered embolic strokes the right PCA territory which is fetal in origin. Neurological consultation was obtained for stroke work-up. She denies any chest pain shortness of breath.  No nausea vomiting.  Reports some dizziness at the time but denies any difficulty walking or incoordination. No fevers chills or sick exposures.   LKW: 12 PM, 04/20/2020 tpa given?: no, outside the window Premorbid modified Rankin scale (mRS): 0  ROS: Performed and negative except as noted in HPI.  Past Medical History:  Diagnosis Date  . Cancer [facility])    bladder, s/p cystectomy with ileal conduit  . History of bladder cancer   . History of tobacco use   . Hyperlipidemia   . Hypertension   . Incarcerated ventral hernia, s/p lap repair 12/05/2011  . Parastomal hernia of ileal conduit, s/p lap repair [id number] 12/04/2011  . Ulcer, gastric, acute or chronic     Family History  Problem Relation Age  of Onset  . Heart disease Mother 25       CHF  . Alcohol abuse Father   . Stroke Father     Social History:   reports that she quit smoking about 27 years ago. She has never used smokeless tobacco. She reports that she does not drink alcohol or use drugs.  Former smoker.  Medications  Current Facility-Administered Medications:  .   stroke: mapping our early stages of recovery book, , Does not apply, Once, Shela Leff, MD .  acetaminophen (TYLENOL) tablet 650 mg, 650 mg, Oral, Q4H PRN **OR** acetaminophen (TYLENOL) 160 MG/5ML solution 650 mg, 650 mg, Per Tube, Q4H PRN **OR** acetaminophen (TYLENOL) suppository 650 mg, 650 mg, Rectal, Q4H PRN, Shela Leff, MD .  aspirin tablet 325 mg, 325 mg, Oral, Daily, Shela Leff, MD .  atorvastatin (LIPITOR) tablet 40 mg, 40 mg, Oral, Daily, Rathore, Vasundhra, MD .  enoxaparin (LOVENOX) injection 40 mg, 40 mg, Subcutaneous, QHS, Rathore, Wandra Feinstein, MD, 40 mg at 04/22/20 0017  Current Outpatient Medications:  .  amLODipine (NORVASC) 5 MG tablet, Take 1 tablet (5 mg total) by mouth daily., Disp: 90 tablet, Rfl: 1 .  aspirin EC 81 MG tablet, Take 81 mg by mouth daily., Disp: , Rfl:  .  atorvastatin (LIPITOR) 20 MG tablet, Take 1 tablet (20 mg total) by mouth every other day., Disp: 45 tablet, Rfl: 1 .  hydrochlorothiazide (HYDRODIURIL) 25 MG tablet, Take 1 tablet (25 mg total) by mouth daily., Disp: 90 tablet, Rfl: 1 .  Blood Pressure Monitor DEVI, Use to check blood pressure 3 times a week, Disp: 1 Device, Rfl: 0 .  UNABLE TO FIND, HOLISTER UROSTOMY POUCHES ITEM [id number], Disp: 100 Device, Rfl: 3   Exam: Current vital signs: BP (!) 158/71   Pulse 73   Temp 98.1 F (36.7 C) (Oral)   Resp 17   SpO2 94%  Vital signs in last 24 hours: Temp:  [98.1 F (36.7 C)] 98.1 F (36.7 C) (05/05 1849) Pulse Rate:  [69-116] 73 (05/06 0200) Resp:  [15-25] 17 (05/06 0200) BP: (96-198)/(66-114) 158/71 (05/06 0200) SpO2:  [93 %-98 %] 94 %  (05/06 0200) GENERAL: Awake, alert in NAD HEENT: - Normocephalic and atraumatic, dry mm, no LN++, no Thyromegally LUNGS - Clear to auscultation bilaterally with no wheezes CV - S1S2 RRR, no m/r/g, equal pulses bilaterally. ABDOMEN - Soft, nontender, nondistended with normoactive BS Ext: warm, well perfused, intact peripheral pulses, no edema  NEURO:  Mental Status: AA&Ox3  Language: speech is no aphasia or dysarthria.  Naming, repetition, fluency, and comprehension intact. Cranial Nerves: PERRL. EOMI, visual fields full, no facial asymmetry, facial sensation diminished on the left, hearing intact, tongue/uvula/soft palate midline, normal sternocleidomastoid and trapezius muscle strength. No evidence of tongue atrophy or fibrillation Motor: 5/5 with no drift in all fours Tone: is normal and bulk is normal Sensation- Intact to light touch bilaterally, no extinction Coordination: FTN questionable mild left finger-nose-finger dysmetria.  Otherwise unremarkable. Gait- deferred  NIHSS-2   Labs I have reviewed labs in epic and the results pertinent to this consultation are: Mildly elevated hemoglobin, hypokalemia, hyperglycemia with blood glucose 133, creatinine 1.30. CBC    Component Value Date/Time   WBC 7.7 04/21/2020 1937   RBC 4.94 04/21/2020 1937   HGB 15.6 (H) 04/21/2020 2008   HGB 13.6 01/17/2017 1105   HCT 46.0 04/21/2020 2008   HCT 41.0 01/17/2017 1105   PLT 283 04/21/2020 1937   PLT 297 01/17/2017 1105   MCV 91.9 04/21/2020 1937   MCV 87 01/17/2017 1105   MCH 29.4 04/21/2020 1937   MCHC 31.9 04/21/2020 1937   RDW 13.1 04/21/2020 1937   RDW 13.0 01/17/2017 1105   LYMPHSABS 0.9 04/21/2020 1937   LYMPHSABS 0.9 01/17/2017 1105   MONOABS 1.1 (H) 04/21/2020 1937   EOSABS 0.1 04/21/2020 1937   EOSABS 0.3 01/17/2017 1105   BASOSABS 0.0 04/21/2020 1937   BASOSABS 0.0 01/17/2017 1105    CMP     Component Value Date/Time   NA 140 04/21/2020 2008   NA 144 10/27/2019 1211    K 3.3 (L) 04/21/2020 2008   CL 94 (L) 04/21/2020 2008   CO2 31 04/21/2020 1937   GLUCOSE 133 (H) 04/21/2020 2008   BUN 15 04/21/2020 2008   BUN 19 10/27/2019 1211   CREATININE 1.30 (H) 04/21/2020 2008   CREATININE 1.46 (H) 09/29/2015 0854   CALCIUM 9.0 04/21/2020 1937   PROT 7.3 04/21/2020 1937   PROT 7.2 10/27/2019 1211   ALBUMIN 3.7 04/21/2020 1937   ALBUMIN 4.3 10/27/2019 1211   AST 16 04/21/2020 1937   ALT 12 04/21/2020 1937   ALKPHOS 74 04/21/2020 1937   BILITOT 0.5 04/21/2020 1937   BILITOT 0.3 10/27/2019 1211   GFRNONAA 33 (L) 04/21/2020 1937   GFRNONAA 35 (L) 09/29/2015 0854   GFRAA 39 (L) 04/21/2020 1937   GFRAA 40 (L) 09/29/2015 0854      Imaging I have reviewed the images obtained:  CT-scan of the brain-CTA head and neck  with right PCA fetal origin and occlusion/near occlusive stenosis proximally within this vessel.  Some flow seen more distally within the right PCA although distally this vessel remains markedly diminutive and irregular.  Atherosclerotic irregularity of the bilateral M2's and distal MCA branches-there are tandem moderate to moderately severe stenosis in the proximal left M2.  Atherosclerotic plaque within the intracranial carotids with mild stenosis.  Right common internal carotid patent with 30 to 40% stenosis of the proximal right ICA. Origin of the left common not visualized well but no occlusion seen in the visualized part.  Dominant right vert is patent throughout the neck without significant stenosis.  Nondominant left vert arises directly from the aortic arch the origin not included in the field-of-view-there is apparent high-grade stenosis within the proximal V1 segment although this may be accentuated by motion artifact. Incidental finding-thyroid nodule in the left thyroid lobe 4.4 cm, not significantly changed since thyroid biopsy of June 2018-hospitalist aware.  MRI examination of the brain-right PCA territory acute infarcts involving the  mesial right temporal lobe, hippocampus, right occipital lobe and callosal splenium along with small acute lacunar infarcts in the right thalamus-all in that right PCA distribution. Chronic white matter changes.  Moderate generalized atrophy.  Assessment: 79 year old woman with above past medical history presenting with greater than 24 hours duration of left face and arm numbness, noted to have a right PCA diminutive flow indicating severe stenosis/near occlusion and MRI revealing a right PCA territory acute infarcts involving the right temporal right occipital and right thalamus-source of stroke-likely atheroembolic versus cardioembolic. Admitted for stroke work-up.  Not a candidate for TPA due to being outside the window Not a candidate for EVT-outside the window and very low stroke scale of 2.   Recommendations: Admit to hospitalist Aspirin 325 High intensity statin Frequent neurochecks Telemetry Allow permissive hypertension.treat only if systolic blood pressure is greater than 220 on a as needed basis given severe stenosis in the right PCA. 2D echo A1c Lipid panel PT OT Speech therapy N.p.o. until cleared by speech or bedside stroke screen Admit to [facility] team to round when patient [facility] at [facility].    -- Amie Portland, MD [facility] Pager: [phone] If 7pm to 7am, please call on call as listed on AMION.

## 2020-04-22 NOTE — Progress Notes (Signed)
Lab resulted compromised urine sample for urine culture, recollected sample from a new urostomy bag and sent down to lab.

## 2020-04-22 NOTE — Evaluation (Signed)
Clinical/Bedside Swallow Evaluation Patient Details  Name: Sara Aguirre MRN: [id number] Date of Birth: 18-Dec-1941  Today's Date: 04/22/2020 Time: SLP Start Time (ACUTE ONLY): 1600 SLP Stop Time (ACUTE ONLY): 1613 SLP Time Calculation (min) (ACUTE ONLY): 13 min  Past Medical History:  Past Medical History:  Diagnosis Date  . Cancer [facility])    bladder, s/p cystectomy with ileal conduit  . History of bladder cancer   . History of tobacco use   . Hyperlipidemia   . Hypertension   . Incarcerated ventral hernia, s/p lap repair 12/05/2011  . Parastomal hernia of ileal conduit, s/p lap repair [id number] 12/04/2011  . Ulcer, gastric, acute or chronic    Past Surgical History:  Past Surgical History:  Procedure Laterality Date  . BLADDER REMOVAL  10/1993   Dr. Risa Grill  . HERNIA REPAIR  12/05/11   parastomal  . PARASTOMAL HERNIA REPAIR  12/05/2011   Procedure: HERNIA REPAIR PARASTOMAL;  Surgeon: Adin Hector, MD;  Location: [facility] ORS;  Service: General;;  Laprascopic lysis of adhestons with reduction and repair with biological mesh for incarcerated parastomal and ventral long incisional hernia.  Marland Kitchen REVISION UROSTOMY CUTANEOUS    . VENTRAL HERNIA REPAIR  12/05/2011   Procedure: HERNIA REPAIR VENTRAL ADULT;  Surgeon: Adin Hector, MD;  Location: [facility] ORS;  Service: General;;   HPI:  Sara Aguirre is a 79 y.o. 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    Assessment / Plan / Recommendation Clinical Impression  Pt's states  she is active, has lots of animals she cares for, is responsible for her medication management and finances. Her granddaughter lives with her. She cut her blood pressure dosage in half after losing weight and eating healthier without MD awareness (said MD didn't call her back). Pt's language, speech is within normal limits. She recalled 3/4 words independently, drew a clock with correct time, generated items in category. Awareness appears functional. Discussed ways to facilitate prospective memory. No follow up needed.     SLP Visit Diagnosis: Cognitive communication deficit (R41.841)    Aspiration Risk       Diet Recommendation          Other  Recommendations     Follow up Recommendations None      Frequency and Duration            Prognosis        Swallow Study   General HPI: Sara Aguirre is a 79 y.o. 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     Oral/Motor/Sensory Function Overall Oral Motor/Sensory Function: Mild impairment Facial ROM: Reduced left;Suspected CN VII (facial) dysfunction Facial Symmetry: Abnormal symmetry left;Suspected CN VII (facial)  dysfunction Facial Strength: Reduced left;Suspected CN VII (facial) dysfunction Lingual ROM: Within Functional Limits Lingual Symmetry: Within Functional Limits Lingual Strength: Within Functional Limits Mandible: Within Functional Limits   Ice Chips     Thin Liquid      Nectar Thick     Honey Thick     Puree     Solid            Houston Siren 04/22/2020,4:34 PM  Orbie Pyo [place].Ed Risk analyst [phone] Office [phone]

## 2020-04-22 NOTE — Plan of Care (Signed)

## 2020-04-22 NOTE — Progress Notes (Signed)
Lower venous duplex       has been completed. Preliminary results can be found under CV proc through chart review. Kohle Winner, BS, RDMS, RVT   

## 2020-04-22 NOTE — Progress Notes (Signed)
PROGRESS NOTE    Sara DEVIN  Aguirre:[id number] DOB: Sep 30, 1941 DOA: 04/21/2020 PCP: Forrest Moron, MD     Brief Narrative:  Sara Aguirre is an  79 y.o. 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  Patient states yesterday 5/4 around noon she took a nap and then woke up with numbness on the lower half of her face on the left side and numbness of her left arm.  She did not have any focal weakness or difficulty with speech.  She also had a headache at that time and felt dizzy.  It felt like things were moving.  In the ED, work up revealed right PCA territory acute infarcts involving the mesial right temporal lobe/hippocampus, medial right temporal lobe more posteriorly, right occipital lobe, and callosal splenium.  Small acute lacunar infarcts are also present within the right thalamus.  Patient was admitted for stroke work-up and neurology was consulted.  New events last 24 hours / Subjective: Feeling well, left lower face numbness and left arm numbness have nearly resolved.  Has no other complaints today.  Assessment & Plan:   Principal Problem:   Acute CVA (cerebrovascular accident) ([place]) Active Problems:   Renal insufficiency   HTN (hypertension)   Pure hypercholesterolemia   Hypokalemia   Acute right PCA stroke -Stroke team following -Embolic work-up including echocardiogram, DVT ultrasound, loop recorder pending -PT OT SLP -Aspirin/plavix  -Lipitor  Essential hypertension -Allow permissive hypertension given acute CVA  CKD stage IIIb -Baseline creatinine 1.3-1.6 -Stable   Thyroid nodule -TSH within normal limit 1.081 -A 4.4 cm left thyroid lobe nodule has not significantly changed in size since thyroid biopsy 05/24/2017 by report. Please correlate with previous pathology  results. However, there are additional thyroid nodules within the inferior left thyroid lobe measuring up to 16 mm which were not previously described. Nonemergent thyroid ultrasound is recommended for further characterization. -Thyroid ultrasound pending  Prediabetes -Hemoglobin A1c 5.9   DVT prophylaxis: Lovenox Code Status: Full Family Communication: No family at bedside Disposition Plan:   Status is: Inpatient  Remains inpatient appropriate because:Ongoing diagnostic testing needed not appropriate for outpatient work up   Dispo: The patient is from: Home              Anticipated d/c is to: Home              Anticipated d/c date is: 1 day              Patient currently is not medically stable to d/c.    Consultants:   Neurology  Procedures:   None   Antimicrobials:  Anti-infectives (From admission, onward)   Start     Dose/Rate Route Frequency Ordered Stop   04/21/20 2330  cefTRIAXone (ROCEPHIN) 1 g in sodium chloride 0.9 % 100 mL IVPB  Status:  Discontinued     1 g 200 mL/hr over 30 Minutes Intravenous Daily at bedtime 04/21/20 2313 04/21/20 2335       Objective: Vitals:   04/22/20 0230 04/22/20 0300 04/22/20 0348 04/22/20 0909  BP: (!) 169/76 (!) 164/73 (!) 180/78 (!) 156/70  Pulse: 76 77 88 88  Resp: 16 16 17 18   Temp:    98.2 F (36.8 C)  TempSrc:      SpO2: 95% 93% 96% 93%    Intake/Output Summary (Last 24 hours) at  04/22/2020 1330 Last data filed at 04/22/2020 0916 Gross per 24 hour  Intake 436 ml  Output --  Net 436 ml   There were no vitals filed for this visit.  Examination:  General exam: Appears calm and comfortable  Respiratory system: Clear to auscultation. Respiratory effort normal. No respiratory distress. No conversational dyspnea.  Cardiovascular system: S1 & S2 heard, RRR. No murmurs. No pedal edema. Gastrointestinal system: Abdomen is nondistended, soft and nontender. Normal bowel sounds heard. Central nervous system: Alert and  oriented. No focal neurological deficits. Speech clear.  Extremities: Symmetric in appearance  Skin: No rashes, lesions or ulcers on exposed skin  Psychiatry: Judgement and insight appear normal. Mood & affect appropriate.   Data Reviewed: I have personally reviewed following labs and imaging studies  CBC: Recent Labs  Lab 04/21/20 1937 04/21/20 2008  WBC 7.7  --   NEUTROABS 5.6  --   HGB 14.5 15.6*  HCT 45.4 46.0  MCV 91.9  --   PLT 283  --    Basic Metabolic Panel: Recent Labs  Lab 04/21/20 1937 04/21/20 2008 04/21/20 2312 04/22/20 0800  NA 139 140  --  140  K 3.4* 3.3*  --  3.5  CL 96* 94*  --  97*  CO2 31  --   --  31  GLUCOSE 143* 133*  --  101*  BUN 16 15  --  15  CREATININE 1.46* 1.30*  --  1.35*  CALCIUM 9.0  --   --  9.3  MG  --   --  2.3  --    GFR: CrCl cannot be calculated (Unknown ideal weight.). Liver Function Tests: Recent Labs  Lab 04/21/20 1937  AST 16  ALT 12  ALKPHOS 74  BILITOT 0.5  PROT 7.3  ALBUMIN 3.7   No results for input(s): LIPASE, AMYLASE in the last 168 hours. No results for input(s): AMMONIA in the last 168 hours. Coagulation Profile: No results for input(s): INR, PROTIME in the last 168 hours. Cardiac Enzymes: No results for input(s): CKTOTAL, CKMB, CKMBINDEX, TROPONINI in the last 168 hours. BNP (last 3 results) No results for input(s): PROBNP in the last 8760 hours. HbA1C: Recent Labs    04/22/20 0810  HGBA1C 5.9*   CBG: No results for input(s): GLUCAP in the last 168 hours. Lipid Profile: Recent Labs    04/22/20 0800  CHOL 194  HDL 49  LDLCALC 112*  TRIG 166*  CHOLHDL 4.0   Thyroid Function Tests: Recent Labs    04/21/20 1937  TSH 1.081   Anemia Panel: No results for input(s): VITAMINB12, FOLATE, FERRITIN, TIBC, IRON, RETICCTPCT in the last 72 hours. Sepsis Labs: No results for input(s): PROCALCITON, LATICACIDVEN in the last 168 hours.  Recent Results (from the past 240 hour(s))  Respiratory Panel  by RT PCR (Flu A&B, Covid) - Nasopharyngeal Swab     Status: None   Collection Time: 04/21/20 10:51 PM   Specimen: Nasopharyngeal Swab  Result Value Ref Range Status   SARS Coronavirus 2 by RT PCR NEGATIVE NEGATIVE Final    Comment: (NOTE) SARS-CoV-2 target nucleic acids are NOT DETECTED. The SARS-CoV-2 RNA is generally detectable in upper respiratoy specimens during the acute phase of infection. The lowest concentration of SARS-CoV-2 viral copies this assay can detect is 131 copies/mL. A negative result does not preclude SARS-Cov-2 infection and should not be used as the sole basis for treatment or other patient management decisions. A negative result may occur with  improper  specimen collection/handling, submission of specimen other than nasopharyngeal swab, presence of viral mutation(s) within the areas targeted by this assay, and inadequate number of viral copies (<131 copies/mL). A negative result must be combined with clinical observations, patient history, and epidemiological information. The expected result is Negative. Fact Sheet for Patients:  PinkCheek.be Fact Sheet for Healthcare Providers:  GravelBags.it This test is not yet ap proved or cleared by the [place] FDA and  has been authorized for detection and/or diagnosis of SARS-CoV-2 by FDA under an Emergency Use Authorization (EUA). This EUA will remain  in effect (meaning this test can be used) for the duration of the COVID-19 declaration under Section 564(b)(1) of the Act, 21 U.S.C. section 360bbb-3(b)(1), unless the authorization is terminated or revoked sooner.    Influenza A by PCR NEGATIVE NEGATIVE Final   Influenza B by PCR NEGATIVE NEGATIVE Final    Comment: (NOTE) The Xpert Xpress SARS-CoV-2/FLU/RSV assay is intended as an aid in  the diagnosis of influenza from Nasopharyngeal swab specimens and  should not be used as a sole basis for treatment.  Nasal washings and  aspirates are unacceptable for Xpert Xpress SARS-CoV-2/FLU/RSV  testing. Fact Sheet for Patients: PinkCheek.be Fact Sheet for Healthcare Providers: GravelBags.it This test is not yet approved or cleared by the [place] FDA and  has been authorized for detection and/or diagnosis of SARS-CoV-2 by  FDA under an Emergency Use Authorization (EUA). This EUA will remain  in effect (meaning this test can be used) for the duration of the  Covid-19 declaration under Section 564(b)(1) of the Act, 21  U.S.C. section 360bbb-3(b)(1), unless the authorization is  terminated or revoked. Performed at [facility], [place] [address], [place], [place] [address]       Radiology Studies: CT Angio Head W or Wo Contrast  Addendum Date: 04/21/2020   ADDENDUM REPORT: 04/21/2020 22:30 ADDENDUM: These results were called by telephone at the time of interpretation on 04/21/2020 at 9:21 pm to provider [facility] , who verbally acknowledged these results. Electronically Signed   By: Kellie Simmering DO   On: 04/21/2020 22:30   Result Date: 04/21/2020 CLINICAL DATA:  Neuro deficit, acute, stroke suspected. Left arm and left face numbness, headache, dizziness since yesterday. Possible right neck bruit. Rule out stroke or vascular abnormality. EXAM: CT ANGIOGRAPHY HEAD AND NECK TECHNIQUE: Multidetector CT imaging of the head and neck was performed using the standard protocol during bolus administration of intravenous contrast. Multiplanar CT image reconstructions and MIPs were obtained to evaluate the vascular anatomy. Carotid stenosis measurements (when applicable) are obtained utilizing NASCET criteria, using the distal internal carotid diameter as the denominator. CONTRAST:  67mL OMNIPAQUE IOHEXOL 350 MG/ML SOLN COMPARISON:  Report from thyroid biopsy 05/24/2017, thyroid ultrasound 01/25/2017 FINDINGS: CT HEAD FINDINGS  Brain: There is moderate/advanced ill-defined hypoattenuation within the cerebral white matter which is nonspecific, but consistent with chronic small vessel ischemic disease. Moderate generalized parenchymal atrophy. There is no acute intracranial hemorrhage. No demarcated cortical infarct. No extra-axial fluid collection. No evidence of intracranial mass. No midline shift. Vascular: No hyperdense vessel.  Atherosclerotic calcifications. Skull: Normal. Negative for fracture or focal lesion. Sinuses: Mild ethmoid sinus mucosal thickening. No significant mastoid effusion. Orbits: No acute abnormality. Review of the MIP images confirms the above findings CTA NECK FINDINGS Aortic arch: Atherosclerotic plaque within the visualized aortic arch and proximal major branch vessels of the neck. The innominate, left common carotid artery and left vertebral artery origins are not included in  the field of view. Right carotid system: The CCA is patent to the bifurcation without measurable stenosis. Calcified plaque within the carotid bifurcation with prominent mixed plaque at the origin of the ECA. Moderate mixed plaque within the proximal ICA with up to 30-40 % stenosis at this site. Distal to this, the ICA is patent within the neck without significant stenosis. Left carotid system: The origin of the left common carotid artery is excluded from the field of view. The CCA is displaced laterally by the large left thyroid nodule. The visualized CCA and ICA are patent within the neck without significant stenosis (50% or greater). Mild calcified plaque within the carotid bifurcation, proximal ICA and proximal ECA. Vertebral arteries: The dominant right vertebral artery is patent throughout the neck without significant stenosis (50% or greater). Mild calcified plaque at the vertebral artery origin and within the distal V2 segment. The non dominant left vertebral artery arises directly from the aortic arch. The origin of this vessel is  not included in the field of view. Apparent high-grade stenosis within the proximal V1 left vertebral artery, although this may be accentuated by motion artifact. Skeleton: No acute bony abnormality or aggressive osseous lesion. Cervical spondylosis with multilevel disc space narrowing, posterior disc osteophytes, uncovertebral and facet hypertrophy. Other neck: Redemonstrated large nodule within the left thyroid lobe measuring 4.4 x 3.9 cm in transaxial dimensions. By report this has not significantly changed in size as compared to thyroid ultrasound 05/24/2017 (previously measuring 4.2 x 3.0 x 4.0 cm). There are additional nodules more inferiorly within the left thyroid lobe measuring up to 16 mm not described previously. Upper chest: No consolidation within the imaged lung apices. Review of the MIP images confirms the above findings CTA HEAD FINDINGS Anterior circulation: The intracranial internal carotid arteries are patent bilaterally. Mild calcified plaque within these vessels with no more than mild stenosis. The M1 middle cerebral arteries are patent without significant stenosis. No M2 proximal branch occlusion or high-grade proximal stenosis is identified. Atherosclerotic irregularity of the M2 and more distal MCA branch vessels bilaterally. Most notably there are tandem moderate to moderately advanced stenoses within a proximal M2 left MCA branch vessel (series 16, images 23 and 24). The anterior cerebral arteries are patent without high-grade proximal stenosis. No intracranial aneurysm is identified. Posterior circulation: The dominant intracranial right vertebral artery is patent without significant stenosis. The non dominant left vertebral artery is developmentally diminutive beyond the origin of the left PICA, although patent. The basilar artery is patent without significant stenosis. Fetal origin right posterior cerebral artery. There is focal occlusion or high-grade stenosis within the proximal right  posterior cerebral artery (series 13, images 75-77). Some flow is seen more distally within the right posterior cerebral artery, although this vessel remains markedly diminutive and irregular. The left posterior cerebral artery is patent without significant proximal stenosis. The left posterior communicating artery is hypoplastic or absent. Venous sinuses: Within limitations of contrast timing, no convincing thrombus. Anatomic variants: As described Review of the MIP images confirms the above findings IMPRESSION: CT head: 1. No evidence of acute intracranial abnormality. 2. Moderate/advanced chronic small vessel ischemic changes within the cerebral white matter. 3. Moderate generalized parenchymal atrophy. 4. Mild ethmoid sinus mucosal thickening. CTA neck: 1. The right common and internal carotid arteries are patent within the neck. Atherosclerotic plaque results in 30-40% stenosis of the proximal right ICA. 2. The origin of the left common carotid artery is excluded from the field of view. Within this limitation, the left  common and internal carotid arteries are patent within the neck without significant stenosis. Mild calcified plaque at the left carotid bifurcation. 3. The dominant right vertebral artery is patent throughout the neck without significant stenosis. 4. The non-dominant left vertebral artery arises directly from the aortic arch. The origin of this vessel is not included in the field of view. There is apparent high-grade stenosis within the proximal V1 segment, although this may be accentuated by motion artifact. 5. A 4.4 cm left thyroid lobe nodule has not significantly changed in size since thyroid biopsy 05/24/2017 by report. Please correlate with previous pathology results. However, there are additional thyroid nodules within the inferior left thyroid lobe measuring up to 16 mm which were not previously described. Nonemergent thyroid ultrasound is recommended for further characterization. CTA  head: 1. The right posterior cerebral artery is fetal in origin. There is a focal occlusion or near-occlusive stenosis proximally within this vessel. Some flow is seen more distally within the right PCA, although distally this vessel remains markedly diminutive and irregular. 2. Atherosclerotic irregularity of the M2 and more distal MCA branch vessels bilaterally. Most notably, there are tandem moderate to moderately severe stenoses within a proximal M2 left MCA branch vessel. 3. Atherosclerotic plaque within the intracranial internal carotid arteries with no more than mild stenosis. Electronically Signed: By: Kellie Simmering DO On: 04/21/2020 21:21   CT Angio Neck W and/or Wo Contrast  Addendum Date: 04/21/2020   ADDENDUM REPORT: 04/21/2020 22:30 ADDENDUM: These results were called by telephone at the time of interpretation on 04/21/2020 at 9:21 pm to provider [facility] , who verbally acknowledged these results. Electronically Signed   By: Kellie Simmering DO   On: 04/21/2020 22:30   Result Date: 04/21/2020 CLINICAL DATA:  Neuro deficit, acute, stroke suspected. Left arm and left face numbness, headache, dizziness since yesterday. Possible right neck bruit. Rule out stroke or vascular abnormality. EXAM: CT ANGIOGRAPHY HEAD AND NECK TECHNIQUE: Multidetector CT imaging of the head and neck was performed using the standard protocol during bolus administration of intravenous contrast. Multiplanar CT image reconstructions and MIPs were obtained to evaluate the vascular anatomy. Carotid stenosis measurements (when applicable) are obtained utilizing NASCET criteria, using the distal internal carotid diameter as the denominator. CONTRAST:  37mL OMNIPAQUE IOHEXOL 350 MG/ML SOLN COMPARISON:  Report from thyroid biopsy 05/24/2017, thyroid ultrasound 01/25/2017 FINDINGS: CT HEAD FINDINGS Brain: There is moderate/advanced ill-defined hypoattenuation within the cerebral white matter which is nonspecific, but consistent with  chronic small vessel ischemic disease. Moderate generalized parenchymal atrophy. There is no acute intracranial hemorrhage. No demarcated cortical infarct. No extra-axial fluid collection. No evidence of intracranial mass. No midline shift. Vascular: No hyperdense vessel.  Atherosclerotic calcifications. Skull: Normal. Negative for fracture or focal lesion. Sinuses: Mild ethmoid sinus mucosal thickening. No significant mastoid effusion. Orbits: No acute abnormality. Review of the MIP images confirms the above findings CTA NECK FINDINGS Aortic arch: Atherosclerotic plaque within the visualized aortic arch and proximal major branch vessels of the neck. The innominate, left common carotid artery and left vertebral artery origins are not included in the field of view. Right carotid system: The CCA is patent to the bifurcation without measurable stenosis. Calcified plaque within the carotid bifurcation with prominent mixed plaque at the origin of the ECA. Moderate mixed plaque within the proximal ICA with up to 30-40 % stenosis at this site. Distal to this, the ICA is patent within the neck without significant stenosis. Left carotid system: The origin of the left  common carotid artery is excluded from the field of view. The CCA is displaced laterally by the large left thyroid nodule. The visualized CCA and ICA are patent within the neck without significant stenosis (50% or greater). Mild calcified plaque within the carotid bifurcation, proximal ICA and proximal ECA. Vertebral arteries: The dominant right vertebral artery is patent throughout the neck without significant stenosis (50% or greater). Mild calcified plaque at the vertebral artery origin and within the distal V2 segment. The non dominant left vertebral artery arises directly from the aortic arch. The origin of this vessel is not included in the field of view. Apparent high-grade stenosis within the proximal V1 left vertebral artery, although this may be  accentuated by motion artifact. Skeleton: No acute bony abnormality or aggressive osseous lesion. Cervical spondylosis with multilevel disc space narrowing, posterior disc osteophytes, uncovertebral and facet hypertrophy. Other neck: Redemonstrated large nodule within the left thyroid lobe measuring 4.4 x 3.9 cm in transaxial dimensions. By report this has not significantly changed in size as compared to thyroid ultrasound 05/24/2017 (previously measuring 4.2 x 3.0 x 4.0 cm). There are additional nodules more inferiorly within the left thyroid lobe measuring up to 16 mm not described previously. Upper chest: No consolidation within the imaged lung apices. Review of the MIP images confirms the above findings CTA HEAD FINDINGS Anterior circulation: The intracranial internal carotid arteries are patent bilaterally. Mild calcified plaque within these vessels with no more than mild stenosis. The M1 middle cerebral arteries are patent without significant stenosis. No M2 proximal branch occlusion or high-grade proximal stenosis is identified. Atherosclerotic irregularity of the M2 and more distal MCA branch vessels bilaterally. Most notably there are tandem moderate to moderately advanced stenoses within a proximal M2 left MCA branch vessel (series 16, images 23 and 24). The anterior cerebral arteries are patent without high-grade proximal stenosis. No intracranial aneurysm is identified. Posterior circulation: The dominant intracranial right vertebral artery is patent without significant stenosis. The non dominant left vertebral artery is developmentally diminutive beyond the origin of the left PICA, although patent. The basilar artery is patent without significant stenosis. Fetal origin right posterior cerebral artery. There is focal occlusion or high-grade stenosis within the proximal right posterior cerebral artery (series 13, images 75-77). Some flow is seen more distally within the right posterior cerebral artery,  although this vessel remains markedly diminutive and irregular. The left posterior cerebral artery is patent without significant proximal stenosis. The left posterior communicating artery is hypoplastic or absent. Venous sinuses: Within limitations of contrast timing, no convincing thrombus. Anatomic variants: As described Review of the MIP images confirms the above findings IMPRESSION: CT head: 1. No evidence of acute intracranial abnormality. 2. Moderate/advanced chronic small vessel ischemic changes within the cerebral white matter. 3. Moderate generalized parenchymal atrophy. 4. Mild ethmoid sinus mucosal thickening. CTA neck: 1. The right common and internal carotid arteries are patent within the neck. Atherosclerotic plaque results in 30-40% stenosis of the proximal right ICA. 2. The origin of the left common carotid artery is excluded from the field of view. Within this limitation, the left common and internal carotid arteries are patent within the neck without significant stenosis. Mild calcified plaque at the left carotid bifurcation. 3. The dominant right vertebral artery is patent throughout the neck without significant stenosis. 4. The non-dominant left vertebral artery arises directly from the aortic arch. The origin of this vessel is not included in the field of view. There is apparent high-grade stenosis within the proximal V1 segment,  although this may be accentuated by motion artifact. 5. A 4.4 cm left thyroid lobe nodule has not significantly changed in size since thyroid biopsy 05/24/2017 by report. Please correlate with previous pathology results. However, there are additional thyroid nodules within the inferior left thyroid lobe measuring up to 16 mm which were not previously described. Nonemergent thyroid ultrasound is recommended for further characterization. CTA head: 1. The right posterior cerebral artery is fetal in origin. There is a focal occlusion or near-occlusive stenosis proximally  within this vessel. Some flow is seen more distally within the right PCA, although distally this vessel remains markedly diminutive and irregular. 2. Atherosclerotic irregularity of the M2 and more distal MCA branch vessels bilaterally. Most notably, there are tandem moderate to moderately severe stenoses within a proximal M2 left MCA branch vessel. 3. Atherosclerotic plaque within the intracranial internal carotid arteries with no more than mild stenosis. Electronically Signed: By: Kellie Simmering DO On: 04/21/2020 21:21   MR BRAIN WO CONTRAST  Result Date: 04/21/2020 CLINICAL DATA:  Neuro deficit, acute, stroke suspected. Additional provided: Numbness of left arm and left-sided facial numbness. EXAM: MRI HEAD WITHOUT CONTRAST TECHNIQUE: Multiplanar, multiecho pulse sequences of the brain and surrounding structures were obtained without intravenous contrast. COMPARISON:  CT angiogram head/neck performed earlier the same evening 04/21/2020. FINDINGS: Brain: The examination is intermittently motion degraded. Most notably, there is moderate motion degradation of the axial T2/FLAIR sequence. 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 There are additional patchy small acute infarcts more posteriorly within the medial right temporal lobe and right occipital lobe. Punctate acute infarct within the splenium of the corpus callosum on the right. Additionally, there are small acute lacunar infarcts within the right thalamus. Corresponding T2/FLAIR hyperintensity at these sites. Moderate/advanced patchy and confluent T2/FLAIR hyperintensity within the cerebral white matter which is nonspecific, but consistent with chronic small vessel ischemic disease. Mild chronic small vessel ischemic changes also present within the brainstem. Moderate generalized parenchymal atrophy. No evidence of intracranial mass. No chronic intracranial blood products. No extra-axial  fluid collection. No midline shift. Vascular: A focal occlusion or near-occlusive stenosis was demonstrated within the proximal right posterior cerebral artery on CTA performed earlier the same day. Skull and upper cervical spine: No focal marrow lesion. Sinuses/Orbits: Visualized orbits show no acute finding. Mild ethmoid sinus mucosal thickening. No significant mastoid effusion. These results were called by telephone at the time of interpretation on 04/21/2020 at 10:15 pm to provider [facility] , who verbally acknowledged these results. IMPRESSION: 1. Motion degraded examination. 2. 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 Small acute lacunar infarcts are also present within the right thalamus. 3. Background moderate to advanced chronic small vessel ischemic changes predominantly within the cerebral white matter. 4. Moderate generalized parenchymal atrophy. Electronically Signed   By: Kellie Simmering DO   On: 04/21/2020 22:16      Scheduled Meds: .  stroke: mapping our early stages of recovery book   Does not apply Once  . aspirin  325 mg Oral Daily  . atorvastatin  40 mg Oral Daily  . clopidogrel  75 mg Oral Daily  . enoxaparin (LOVENOX) injection  40 mg Subcutaneous QHS   Continuous Infusions:   LOS: 1 day      Time spent: 35 minutes   Dessa Phi, DO [facility] 04/22/2020, 1:30 PM   Available via  Epic secure chat 7am-7pm After these hours, please refer to coverage provider listed on [email or web address]

## 2020-04-22 NOTE — Plan of Care (Signed)
  Problem: Education: Goal: Knowledge of General Education information will improve Description: Including pain rating scale, medication(s)/side effects and non-pharmacologic comfort measures Outcome: Progressing   Problem: Health Behavior/Discharge Planning: Goal: Ability to manage health-related needs will improve Outcome: Progressing   Problem: Clinical Measurements: Goal: Ability to maintain clinical measurements within normal limits will improve Outcome: Progressing Goal: Will remain free from infection Outcome: Progressing Goal: Diagnostic test results will improve Outcome: Progressing Goal: Cardiovascular complication will be avoided Outcome: Progressing   Problem: Elimination: Goal: Will not experience complications related to bowel motility Outcome: Progressing Goal: Will not experience complications related to urinary retention Outcome: Progressing   Problem: Pain Managment: Goal: General experience of comfort will improve Outcome: Progressing   Problem: Safety: Goal: Ability to remain free from injury will improve Outcome: Progressing   Problem: Skin Integrity: Goal: Risk for impaired skin integrity will decrease Outcome: Progressing   Problem: Education: Goal: Knowledge of disease or condition will improve Outcome: Progressing Goal: Knowledge of secondary prevention will improve Outcome: Progressing Goal: Knowledge of patient specific risk factors addressed and post discharge goals established will improve Outcome: Progressing Goal: Individualized Educational Video(s) Outcome: Progressing   Problem: Coping: Goal: Will verbalize positive feelings about self Outcome: Progressing Goal: Will identify appropriate support needs Outcome: Progressing   Problem: Health Behavior/Discharge Planning: Goal: Ability to manage health-related needs will improve Outcome: Progressing   Problem: Self-Care: Goal: Ability to participate in self-care as condition  permits will improve Outcome: Progressing Goal: Verbalization of feelings and concerns over difficulty with self-care will improve Outcome: Progressing Goal: Ability to communicate needs accurately will improve Outcome: Progressing   Problem: Nutrition: Goal: Risk of aspiration will decrease Outcome: Progressing Goal: Dietary intake will improve Outcome: Progressing   Problem: Intracerebral Hemorrhage Tissue Perfusion: Goal: Complications of Intracerebral Hemorrhage will be minimized Outcome: Progressing   Problem: Ischemic Stroke/TIA Tissue Perfusion: Goal: Complications of ischemic stroke/TIA will be minimized Outcome: Progressing

## 2020-04-23 ENCOUNTER — Encounter (HOSPITAL_COMMUNITY)
Admission: EM | Disposition: A | Discharge status: Home / Self Care | Payer: Self-pay | Source: Home / Self Care | Attending: Internal Medicine

## 2020-04-23 DIAGNOSIS — I6389 Other cerebral infarction: Secondary | ICD-10-CM

## 2020-04-23 HISTORY — PX: LOOP RECORDER INSERTION: EP1214

## 2020-04-23 LAB — URINE CULTURE

## 2020-04-23 SURGERY — LOOP RECORDER INSERTION

## 2020-04-23 MED ORDER — ASPIRIN 325 MG PO TABS: 325.0000 mg | ORAL_TABLET | Freq: Every day | ORAL | 2 refills | Status: AC

## 2020-04-23 MED ORDER — LIDOCAINE-EPINEPHRINE 1 %-1:100000 IJ SOLN
INTRAMUSCULAR | Status: DC | PRN
  Administered 2020-04-23: 30 mL

## 2020-04-23 MED ORDER — CLOPIDOGREL BISULFATE 75 MG PO TABS: 75.0000 mg | ORAL_TABLET | Freq: Every day | ORAL | 2 refills | Status: DC

## 2020-04-23 MED ORDER — ATORVASTATIN CALCIUM 40 MG PO TABS: 40.0000 mg | ORAL_TABLET | Freq: Every day | ORAL | 2 refills | Status: DC

## 2020-04-23 MED ORDER — LIDOCAINE-EPINEPHRINE 1 %-1:100000 IJ SOLN
INTRAMUSCULAR | Status: AC
  Filled 2020-04-23: qty 1

## 2020-04-23 SURGICAL SUPPLY — 2 items
MONITOR REVEAL LINQ II (Prosthesis & Implant Heart) ×2 IMPLANT
PACK LOOP INSERTION (CUSTOM PROCEDURE TRAY) ×3 IMPLANT

## 2020-04-23 NOTE — Discharge Summary (Signed)
Physician Discharge Summary  Sara Aguirre ZOX:[id number] DOB: September 29, 1941 DOA: 04/21/2020  PCP: Forrest Moron, MD  Admit date: 04/21/2020 Discharge date: 04/23/2020  Admitted From: home Disposition:  home with home health PT  Recommendations for Outpatient Follow-up:  1. Follow up with PCP in 1 week 2. Follow up with neurology as an outpatient 3. Follow-up for wound check, implantable loop recorder  Discharge Condition: Stable CODE STATUS: Full code Diet recommendation:  Diet Orders (From admission, onward)    Start     Ordered   04/23/20 0000  Diet - low sodium heart healthy     04/23/20 1016   04/22/20 0643  Diet Heart Room service appropriate? Yes; Fluid consistency: Thin  Diet effective now    Question Answer Comment  Room service appropriate? 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 She did not have any focal weakness or difficulty with speech. She also had a headache at that time and felt dizzy. It felt like things were moving. In the ED, work up revealed right PCA territory acute infarcts involving the mesial right temporal lobe/hippocampus, medial right temporal lobe more posteriorly, right occipital lobe, and callosal splenium. Small acute lacunar infarcts are also present within the right thalamus.  Patient was admitted for stroke work-up and neurology was consulted.  Started on aspirin, Plavix for 3 months then Plavix alone.  Lipitor dose was increased.  She underwent lower extremity Doppler as well as  echocardiogram.  Loop recorder was placed prior to discharge.  Discharge Diagnoses:  Principal Problem:   Acute CVA (cerebrovascular accident) [facility]) Active Problems:   Renal insufficiency   HTN (hypertension)   Pure hypercholesterolemia   Hypokalemia   Acute right PCA stroke -Stroke team consulted -Embolic work-up including echocardiogram, DVT ultrasound completed -Implantable Loop recorder 5/7 -Aspirin/plavix  for 3 months, then Plavix alone -Lipitor  Essential hypertension -Allow permissive hypertension given acute CVA  CKD stage IIIb -Baseline creatinine 1.3-1.6 -Stable   Thyroid nodule -TSH within normal limit 1.081 -A 4.4 cm left thyroid lobe nodule has not significantly changed in size since thyroid biopsy 05/24/2017 by report. Please correlate with previous pathology results. However, there are additional thyroid nodules within the inferior left thyroid lobe measuring up to 16 mm which were not previously described. Nonemergent thyroid ultrasound is recommended for further characterization. -Thyroid ultrasound: Similar findings of multinodular goiter. No definitive worrisome new or enlarging thyroid nodules. Previously biopsied approximately 5.0 cm nodule/mass replacing the left lobe of the thyroid is grossly unchanged compared to the 2018 examination. This nodule has been biopsied both in 2018 as well as 2006. Correlation with previous biopsy results is advised. Assuming a benign pathologic diagnosis, repeat sampling and/or continued dedicated follow-up is not recommended. None of the remaining thyroid nodules meet imaging criteria to recommend percutaneous sampling or continued dedicated follow-up  Prediabetes -Hemoglobin A1c 5.9   Discharge Instructions  Discharge Instructions    Ambulatory referral to Neurology   Complete by: As directed    An appointment is requested in  approximately: 4 weeks   Call MD for:  difficulty breathing, headache or visual  disturbances   Complete by: As directed    Call MD for:  extreme fatigue   Complete by: As directed    Call MD for:  persistant dizziness or light-headedness   Complete by: As directed    Call MD for:  persistant nausea and vomiting   Complete by: As directed    Call MD for:  severe uncontrolled pain   Complete by: As directed    Call MD for:  temperature >100.4   Complete by: As directed    Diet - low sodium heart healthy   Complete by: As directed    Discharge instructions   Complete by: As directed    You were cared for by a hospitalist during your hospital stay. If you have any questions about your discharge medications or the care you received while you were in the hospital after you are discharged, you can call the unit and ask to speak with the hospitalist on call if the hospitalist that took care of you is not available. Once you are discharged, your primary care physician will handle any further medical issues. Please note that NO REFILLS for any discharge medications will be authorized once you are discharged, as it is imperative that you return to your primary care physician (or establish a relationship with a primary care physician if you do not have one) for your aftercare needs so that they can reassess your need for medications and monitor your lab values.   Increase activity slowly   Complete by: As directed      Allergies as of 04/23/2020      Reactions   Imodium [loperamide]    Itchy all over   Loperamide Hcl Rash      Medication List    STOP taking these medications   aspirin EC 81 MG tablet Replaced by: aspirin 325 MG tablet     TAKE these medications   amLODipine 5 MG tablet Commonly known as: NORVASC Take 1 tablet (5 mg total) by mouth daily.   aspirin 325 MG tablet Take 1 tablet (325 mg total) by mouth daily. Start taking on: Apr 24, 2020 Replaces: aspirin EC 81 MG tablet   atorvastatin 40 MG tablet Commonly known as: LIPITOR Take 1 tablet (40 mg  total) by mouth daily. Start taking on: Apr 24, 2020 What changed:   medication strength  how much to take  when to take this   Blood Pressure Monitor Devi Use to check blood pressure 3 times a week   clopidogrel 75 MG tablet Commonly known as: PLAVIX Take 1 tablet (75 mg total) by mouth daily. Start taking on: Apr 24, 2020   hydrochlorothiazide 25 MG tablet Commonly known as: HYDRODIURIL Take 1 tablet (25 mg total) by mouth daily.   UNABLE TO FIND HOLISTER UROSTOMY POUCHES ITEM [place]      Follow-up Information    [facility] Office Follow up.   Specialty: Cardiology Why: 05/06/2020 @ 3:00PM, wound check visit (heart monitor) Contact information: [address] [place]       Forrest Moron, MD. Schedule an appointment as soon as possible for a visit in 1 week(s).   Specialty: Internal Medicine Contact information: [place] [address] [phone]        [place]. Schedule an appointment as soon as possible for a visit in 1  month(s).   Contact information: [address]     [address] [place] [address] [phone]         Allergies  Allergen Reactions  . Imodium [Loperamide]     Itchy all over  . Loperamide Hcl Rash    Consultations:  Neurology  EP    Procedures/Studies: CT Angio Head W or Wo Contrast  Addendum Date: 04/21/2020   ADDENDUM REPORT: 04/21/2020 22:30 ADDENDUM: These results were called by telephone at the time of interpretation on 04/21/2020 at 9:21 pm to provider [facility] , who verbally acknowledged these results. Electronically Signed   By: Kellie Simmering DO   On: 04/21/2020 22:30   Result Date: 04/21/2020 CLINICAL DATA:  Neuro deficit, acute, stroke suspected. Left arm and left face numbness, headache, dizziness since yesterday. Possible right neck bruit. Rule out stroke or vascular abnormality.  EXAM: CT ANGIOGRAPHY HEAD AND NECK TECHNIQUE: Multidetector CT imaging of the head and neck was performed using the standard protocol during bolus administration of intravenous contrast. Multiplanar CT image reconstructions and MIPs were obtained to evaluate the vascular anatomy. Carotid stenosis measurements (when applicable) are obtained utilizing NASCET criteria, using the distal internal carotid diameter as the denominator. CONTRAST:  9mL OMNIPAQUE IOHEXOL 350 MG/ML SOLN COMPARISON:  Report from thyroid biopsy 05/24/2017, thyroid ultrasound 01/25/2017 FINDINGS: CT HEAD FINDINGS Brain: There is moderate/advanced ill-defined hypoattenuation within the cerebral white matter which is nonspecific, but consistent with chronic small vessel ischemic disease. Moderate generalized parenchymal atrophy. There is no acute intracranial hemorrhage. No demarcated cortical infarct. No extra-axial fluid collection. No evidence of intracranial mass. No midline shift. Vascular: No hyperdense vessel.  Atherosclerotic calcifications. Skull: Normal. Negative for fracture or focal lesion. Sinuses: Mild ethmoid sinus mucosal thickening. No significant mastoid effusion. Orbits: No acute abnormality. Review of the MIP images confirms the above findings CTA NECK FINDINGS Aortic arch: Atherosclerotic plaque within the visualized aortic arch and proximal major branch vessels of the neck. The innominate, left common carotid artery and left vertebral artery origins are not included in the field of view. Right carotid system: The CCA is patent to the bifurcation without measurable stenosis. Calcified plaque within the carotid bifurcation with prominent mixed plaque at the origin of the ECA. Moderate mixed plaque within the proximal ICA with up to 30-40 % stenosis at this site. Distal to this, the ICA is patent within the neck without significant stenosis. Left carotid system: The origin of the left common carotid artery is excluded from the  field of view. The CCA is displaced laterally by the large left thyroid nodule. The visualized CCA and ICA are patent within the neck without significant stenosis (50% or greater). Mild calcified plaque within the carotid bifurcation, proximal ICA and proximal ECA. Vertebral arteries: The dominant right vertebral artery is patent throughout the neck without significant stenosis (50% or greater). Mild calcified plaque at the vertebral artery origin and within the distal V2 segment. The non dominant left vertebral artery arises directly from the aortic arch. The origin of this vessel is not included in the field of view. Apparent high-grade stenosis within the proximal V1 left vertebral artery, although this may be accentuated by motion artifact. Skeleton: No acute bony abnormality or aggressive osseous lesion. Cervical spondylosis with multilevel disc space narrowing, posterior disc osteophytes, uncovertebral and facet hypertrophy. Other neck: Redemonstrated large nodule within the left thyroid lobe measuring 4.4 x 3.9 cm in transaxial dimensions. By report this has not significantly changed in size as compared  to thyroid ultrasound 05/24/2017 (previously measuring 4.2 x 3.0 x 4.0 cm). There are additional nodules more inferiorly within the left thyroid lobe measuring up to 16 mm not described previously. Upper chest: No consolidation within the imaged lung apices. Review of the MIP images confirms the above findings CTA HEAD FINDINGS Anterior circulation: The intracranial internal carotid arteries are patent bilaterally. Mild calcified plaque within these vessels with no more than mild stenosis. The M1 middle cerebral arteries are patent without significant stenosis. No M2 proximal branch occlusion or high-grade proximal stenosis is identified. Atherosclerotic irregularity of the M2 and more distal MCA branch vessels bilaterally. Most notably there are tandem moderate to moderately advanced stenoses within a  proximal M2 left MCA branch vessel (series 16, images 23 and 24). The anterior cerebral arteries are patent without high-grade proximal stenosis. No intracranial aneurysm is identified. Posterior circulation: The dominant intracranial right vertebral artery is patent without significant stenosis. The non dominant left vertebral artery is developmentally diminutive beyond the origin of the left PICA, although patent. The basilar artery is patent without significant stenosis. Fetal origin right posterior cerebral artery. There is focal occlusion or high-grade stenosis within the proximal right posterior cerebral artery (series 13, images 75-77). Some flow is seen more distally within the right posterior cerebral artery, although this vessel remains markedly diminutive and irregular. The left posterior cerebral artery is patent without significant proximal stenosis. The left posterior communicating artery is hypoplastic or absent. Venous sinuses: Within limitations of contrast timing, no convincing thrombus. Anatomic variants: As described Review of the MIP images confirms the above findings IMPRESSION: CT head: 1. No evidence of acute intracranial abnormality. 2. Moderate/advanced chronic small vessel ischemic changes within the cerebral white matter. 3. Moderate generalized parenchymal atrophy. 4. Mild ethmoid sinus mucosal thickening. CTA neck: 1. The right common and internal carotid arteries are patent within the neck. Atherosclerotic plaque results in 30-40% stenosis of the proximal right ICA. 2. The origin of the left common carotid artery is excluded from the field of view. Within this limitation, the left common and internal carotid arteries are patent within the neck without significant stenosis. Mild calcified plaque at the left carotid bifurcation. 3. The dominant right vertebral artery is patent throughout the neck without significant stenosis. 4. The non-dominant left vertebral artery arises directly from  the aortic arch. The origin of this vessel is not included in the field of view. There is apparent high-grade stenosis within the proximal V1 segment, although this may be accentuated by motion artifact. 5. A 4.4 cm left thyroid lobe nodule has not significantly changed in size since thyroid biopsy 05/24/2017 by report. Please correlate with previous pathology results. However, there are additional thyroid nodules within the inferior left thyroid lobe measuring up to 16 mm which were not previously described. Nonemergent thyroid ultrasound is recommended for further characterization. CTA head: 1. The right posterior cerebral artery is fetal in origin. There is a focal occlusion or near-occlusive stenosis proximally within this vessel. Some flow is seen more distally within the right PCA, although distally this vessel remains markedly diminutive and irregular. 2. Atherosclerotic irregularity of the M2 and more distal MCA branch vessels bilaterally. Most notably, there are tandem moderate to moderately severe stenoses within a proximal M2 left MCA branch vessel. 3. Atherosclerotic plaque within the intracranial internal carotid arteries with no more than mild stenosis. Electronically Signed: By: Kellie Simmering DO On: 04/21/2020 21:21   CT Angio Neck W and/or Wo Contrast  Addendum Date: 04/21/2020  ADDENDUM REPORT: 04/21/2020 22:30 ADDENDUM: These results were called by telephone at the time of interpretation on 04/21/2020 at 9:21 pm to provider [facility] , who verbally acknowledged these results. Electronically Signed   By: Kellie Simmering DO   On: 04/21/2020 22:30   Result Date: 04/21/2020 CLINICAL DATA:  Neuro deficit, acute, stroke suspected. Left arm and left face numbness, headache, dizziness since yesterday. Possible right neck bruit. Rule out stroke or vascular abnormality. EXAM: CT ANGIOGRAPHY HEAD AND NECK TECHNIQUE: Multidetector CT imaging of the head and neck was performed using the standard  protocol during bolus administration of intravenous contrast. Multiplanar CT image reconstructions and MIPs were obtained to evaluate the vascular anatomy. Carotid stenosis measurements (when applicable) are obtained utilizing NASCET criteria, using the distal internal carotid diameter as the denominator. CONTRAST:  69mL OMNIPAQUE IOHEXOL 350 MG/ML SOLN COMPARISON:  Report from thyroid biopsy 05/24/2017, thyroid ultrasound 01/25/2017 FINDINGS: CT HEAD FINDINGS Brain: There is moderate/advanced ill-defined hypoattenuation within the cerebral white matter which is nonspecific, but consistent with chronic small vessel ischemic disease. Moderate generalized parenchymal atrophy. There is no acute intracranial hemorrhage. No demarcated cortical infarct. No extra-axial fluid collection. No evidence of intracranial mass. No midline shift. Vascular: No hyperdense vessel.  Atherosclerotic calcifications. Skull: Normal. Negative for fracture or focal lesion. Sinuses: Mild ethmoid sinus mucosal thickening. No significant mastoid effusion. Orbits: No acute abnormality. Review of the MIP images confirms the above findings CTA NECK FINDINGS Aortic arch: Atherosclerotic plaque within the visualized aortic arch and proximal major branch vessels of the neck. The innominate, left common carotid artery and left vertebral artery origins are not included in the field of view. Right carotid system: The CCA is patent to the bifurcation without measurable stenosis. Calcified plaque within the carotid bifurcation with prominent mixed plaque at the origin of the ECA. Moderate mixed plaque within the proximal ICA with up to 30-40 % stenosis at this site. Distal to this, the ICA is patent within the neck without significant stenosis. Left carotid system: The origin of the left common carotid artery is excluded from the field of view. The CCA is displaced laterally by the large left thyroid nodule. The visualized CCA and ICA are patent within  the neck without significant stenosis (50% or greater). Mild calcified plaque within the carotid bifurcation, proximal ICA and proximal ECA. Vertebral arteries: The dominant right vertebral artery is patent throughout the neck without significant stenosis (50% or greater). Mild calcified plaque at the vertebral artery origin and within the distal V2 segment. The non dominant left vertebral artery arises directly from the aortic arch. The origin of this vessel is not included in the field of view. Apparent high-grade stenosis within the proximal V1 left vertebral artery, although this may be accentuated by motion artifact. Skeleton: No acute bony abnormality or aggressive osseous lesion. Cervical spondylosis with multilevel disc space narrowing, posterior disc osteophytes, uncovertebral and facet hypertrophy. Other neck: Redemonstrated large nodule within the left thyroid lobe measuring 4.4 x 3.9 cm in transaxial dimensions. By report this has not significantly changed in size as compared to thyroid ultrasound 05/24/2017 (previously measuring 4.2 x 3.0 x 4.0 cm). There are additional nodules more inferiorly within the left thyroid lobe measuring up to 16 mm not described previously. Upper chest: No consolidation within the imaged lung apices. Review of the MIP images confirms the above findings CTA HEAD FINDINGS Anterior circulation: The intracranial internal carotid arteries are patent bilaterally. Mild calcified plaque within these vessels with no more than  mild stenosis. The M1 middle cerebral arteries are patent without significant stenosis. No M2 proximal branch occlusion or high-grade proximal stenosis is identified. Atherosclerotic irregularity of the M2 and more distal MCA branch vessels bilaterally. Most notably there are tandem moderate to moderately advanced stenoses within a proximal M2 left MCA branch vessel (series 16, images 23 and 24). The anterior cerebral arteries are patent without high-grade  proximal stenosis. No intracranial aneurysm is identified. Posterior circulation: The dominant intracranial right vertebral artery is patent without significant stenosis. The non dominant left vertebral artery is developmentally diminutive beyond the origin of the left PICA, although patent. The basilar artery is patent without significant stenosis. Fetal origin right posterior cerebral artery. There is focal occlusion or high-grade stenosis within the proximal right posterior cerebral artery (series 13, images 75-77). Some flow is seen more distally within the right posterior cerebral artery, although this vessel remains markedly diminutive and irregular. The left posterior cerebral artery is patent without significant proximal stenosis. The left posterior communicating artery is hypoplastic or absent. Venous sinuses: Within limitations of contrast timing, no convincing thrombus. Anatomic variants: As described Review of the MIP images confirms the above findings IMPRESSION: CT head: 1. No evidence of acute intracranial abnormality. 2. Moderate/advanced chronic small vessel ischemic changes within the cerebral white matter. 3. Moderate generalized parenchymal atrophy. 4. Mild ethmoid sinus mucosal thickening. CTA neck: 1. The right common and internal carotid arteries are patent within the neck. Atherosclerotic plaque results in 30-40% stenosis of the proximal right ICA. 2. The origin of the left common carotid artery is excluded from the field of view. Within this limitation, the left common and internal carotid arteries are patent within the neck without significant stenosis. Mild calcified plaque at the left carotid bifurcation. 3. The dominant right vertebral artery is patent throughout the neck without significant stenosis. 4. The non-dominant left vertebral artery arises directly from the aortic arch. The origin of this vessel is not included in the field of view. There is apparent high-grade stenosis within  the proximal V1 segment, although this may be accentuated by motion artifact. 5. A 4.4 cm left thyroid lobe nodule has not significantly changed in size since thyroid biopsy 05/24/2017 by report. Please correlate with previous pathology results. However, there are additional thyroid nodules within the inferior left thyroid lobe measuring up to 16 mm which were not previously described. Nonemergent thyroid ultrasound is recommended for further characterization. CTA head: 1. The right posterior cerebral artery is fetal in origin. There is a focal occlusion or near-occlusive stenosis proximally within this vessel. Some flow is seen more distally within the right PCA, although distally this vessel remains markedly diminutive and irregular. 2. Atherosclerotic irregularity of the M2 and more distal MCA branch vessels bilaterally. Most notably, there are tandem moderate to moderately severe stenoses within a proximal M2 left MCA branch vessel. 3. Atherosclerotic plaque within the intracranial internal carotid arteries with no more than mild stenosis. Electronically Signed: By: Kellie Simmering DO On: 04/21/2020 21:21   MR BRAIN WO CONTRAST  Result Date: 04/21/2020 CLINICAL DATA:  Neuro deficit, acute, stroke suspected. Additional provided: Numbness of left arm and left-sided facial numbness. EXAM: MRI HEAD WITHOUT CONTRAST TECHNIQUE: Multiplanar, multiecho pulse sequences of the brain and surrounding structures were obtained without intravenous contrast. COMPARISON:  CT angiogram head/neck performed earlier the same evening 04/21/2020. FINDINGS: Brain: The examination is intermittently motion degraded. Most notably, there is moderate motion degradation of the axial T2/FLAIR sequence. There is restricted diffusion consistent  with acute infarction within the mesial right temporal lobe/hippocampus. There are additional patchy small acute infarcts more posteriorly within the medial right temporal lobe and right occipital lobe.  Punctate acute infarct within the splenium of the corpus callosum on the right. Additionally, there are small acute lacunar infarcts within the right thalamus. Corresponding T2/FLAIR hyperintensity at these sites. Moderate/advanced patchy and confluent T2/FLAIR hyperintensity within the cerebral white matter which is nonspecific, but consistent with chronic small vessel ischemic disease. Mild chronic small vessel ischemic changes also present within the brainstem. Moderate generalized parenchymal atrophy. No evidence of intracranial mass. No chronic intracranial blood products. No extra-axial fluid collection. No midline shift. Vascular: A focal occlusion or near-occlusive stenosis was demonstrated within the proximal right posterior cerebral artery on CTA performed earlier the same day. Skull and upper cervical spine: No focal marrow lesion. Sinuses/Orbits: Visualized orbits show no acute finding. Mild ethmoid sinus mucosal thickening. No significant mastoid effusion. These results were called by telephone at the time of interpretation on 04/21/2020 at 10:15 pm to provider [facility] , who verbally acknowledged these results. IMPRESSION: 1. Motion degraded examination. 2. 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 Small acute lacunar infarcts are also present within the right thalamus. 3. Background moderate to advanced chronic small vessel ischemic changes predominantly within the cerebral white matter. 4. Moderate generalized parenchymal atrophy. Electronically Signed   By: Kellie Simmering DO   On: 04/21/2020 22:16   ECHOCARDIOGRAM COMPLETE  Result Date: 04/22/2020    ECHOCARDIOGRAM REPORT   Patient Name:   TABYTHA GRADILLAS Date of Exam: 04/22/2020 Medical Rec #:  [id number]      Height:       67.5 in Accession #:    [id number]     Weight:       163.0 lb Date of Birth:   1941/05/20      BSA:          1.864 m Patient Age:    26 years       BP:           156/70 mmHg Patient Gender: F              HR:           88 bpm. Exam Location:  Inpatient Procedure: 2D Echo Indications:    Stroke 434.91 / I163.9  History:        Patient has no prior history of Echocardiogram examinations.                 Risk Factors:Hypertension, Dyslipidemia and Former Smoker.                 Chronic kidney disease. History of cancer.  Sonographer:    Darlina Sicilian RDCS Referring Phys: [id number] [place]  1. Left ventricular ejection fraction, by estimation, is 55 to 60%. The left ventricle has normal function. The left ventricle has no regional wall motion abnormalities. There is mild left ventricular hypertrophy. Left ventricular diastolic parameters are consistent with Grade I diastolic dysfunction (impaired relaxation).  2. Right ventricular systolic function is normal. The right ventricular size is normal. There is normal pulmonary artery systolic pressure. The estimated right ventricular systolic pressure is 83.6 mmHg.  3. The mitral valve is degenerative. No evidence of mitral valve regurgitation. No evidence of mitral stenosis.  4. The aortic valve is tricuspid. Aortic valve regurgitation is mild.  Mild aortic valve sclerosis is present, with no evidence of aortic valve stenosis.  5. The inferior vena cava is normal in size with greater than 50% respiratory variability, suggesting right atrial pressure of 3 mmHg. FINDINGS  Left Ventricle: Left ventricular ejection fraction, by estimation, is 55 to 60%. The left ventricle has normal function. The left ventricle has no regional wall motion abnormalities. The left ventricular internal cavity size was normal in size. There is  mild left ventricular hypertrophy. Left ventricular diastolic parameters are consistent with Grade I diastolic dysfunction (impaired relaxation). Right Ventricle: The right ventricular size is normal. No increase in  right ventricular wall thickness. Right ventricular systolic function is normal. There is normal pulmonary artery systolic pressure. The tricuspid regurgitant velocity is 2.39 m/s, and  with an assumed right atrial pressure of 3 mmHg, the estimated right ventricular systolic pressure is 32.4 mmHg. Left Atrium: Left atrial size was normal in size. Right Atrium: Right atrial size was normal in size. Pericardium: There is no evidence of pericardial effusion. Mitral Valve: The mitral valve is degenerative in appearance. There is moderate calcification of the mitral valve leaflet(s). No evidence of mitral valve regurgitation. No evidence of mitral valve stenosis. Tricuspid Valve: The tricuspid valve is normal in structure. Tricuspid valve regurgitation is trivial. Aortic Valve: The aortic valve is tricuspid. Aortic valve regurgitation is mild. Aortic regurgitation PHT measures 458 msec. Mild aortic valve sclerosis is present, with no evidence of aortic valve stenosis. Pulmonic Valve: The pulmonic valve was normal in structure. Pulmonic valve regurgitation is not visualized. Aorta: The aortic root is normal in size and structure. Venous: The inferior vena cava is normal in size with greater than 50% respiratory variability, suggesting right atrial pressure of 3 mmHg. IAS/Shunts: No atrial level shunt detected by color flow Doppler.  LEFT VENTRICLE PLAX 2D LVIDd:         4.00 cm  Diastology LVIDs:         2.90 cm  LV e' lateral:   7.54 cm/s LV PW:         1.00 cm  LV E/e' lateral: 6.4 LV IVS:        1.20 cm  LV e' medial:    6.65 cm/s LVOT diam:     1.70 cm  LV E/e' medial:  7.3 LV SV:         32 LV SV Index:   17 LVOT Area:     2.27 cm  RIGHT VENTRICLE TAPSE (M-mode): 2.0 cm LEFT ATRIUM             Index       RIGHT ATRIUM           Index LA diam:        3.60 cm 1.93 cm/m  RA Area:     10.40 cm LA Vol (A2C):   42.2 ml 22.64 ml/m RA Volume:   19.80 ml  10.62 ml/m LA Vol (A4C):   41.3 ml 22.16 ml/m LA Biplane Vol:  42.1 ml 22.58 ml/m  AORTIC VALVE LVOT Vmax:   96.70 cm/s LVOT Vmean:  52.000 cm/s LVOT VTI:    0.142 m AI PHT:      458 msec  AORTA Ao Root diam: 3.00 cm MITRAL VALVE               TRICUSPID VALVE MV Area (PHT): 3.42 cm    TR Peak grad:   22.8 mmHg MV Decel Time: 222 msec    TR Vmax:  239.00 cm/s MV E velocity: 48.40 cm/s MV A velocity: 74.60 cm/s  SHUNTS MV E/A ratio:  0.65        Systemic VTI:  0.14 m                            Systemic Diam: 1.70 cm Loralie Champagne MD Electronically signed by Loralie Champagne MD Signature Date/Time: 04/22/2020/5:58:53 PM    Final    US THYROID  Result Date: 04/23/2020 CLINICAL DATA:  Prior ultrasound follow-up. Follow-up thyroid nodules. History of ultrasound-guided fine-needle aspiration of dominant left-sided thyroid nodule performed 09/04/2005 and 05/24/2017 EXAM: THYROID ULTRASOUND TECHNIQUE: Ultrasound examination of the thyroid gland and adjacent soft tissues was performed. COMPARISON:  01/25/2017; 08/02/2005; ultrasound-guided left thyroid nodule fine-needle aspiration-09/04/2005; 05/24/2017 FINDINGS: Parenchymal Echotexture: Mildly heterogenous Isthmus: Normal in size measuring 0.2 cm in diameter, unchanged Right lobe: Normal in size measuring 5.0 x 1.1 x 4.1 cm, previously, 5.0 x 1.4 x 1.9 cm Left lobe: Borderline enlarged measuring 5.6 x 3.3 x 3.7 cm, previously, 5.4 x 3.6 x 4.3 cm _________________________________________________________ Estimated total number of nodules >/= 1 cm: 1 Number of spongiform nodules >/=  2 cm not described below (TR1): 0 Number of mixed cystic and solid nodules >/= 1.5 cm not described below (TR2): 0 _________________________________________________________ There is an approximately 0.8 cm spongiform/benign-appearing nodule within the superior pole the right lobe of the thyroid (labeled 1) which is unchanged compared to the 01/2017 examination, previously, 0.7 cm, and again does not meet criteria to recommend percutaneous sampling or  continued dedicated follow-up. There is an approximately 0.9 cm isoechoic ill-defined nodule within the inferior pole the right lobe of the thyroid (labeled 2), which is unchanged compared to the 2018 examination, previously, 0.8 cm, and again does not meet criteria to recommend percutaneous sampling or continued dedicated follow-up. There is an approximately 0.6 cm spongiform/benign-appearing nodule within the inferior pole the right lobe of the thyroid (labeled 3), which is unchanged compared to the 2018 examination, previously, 0.6 cm, and again does not meet criteria to recommend percutaneous sampling or continued dedicated follow-up _________________________________________________________ Previously biopsied approximately 5.0 x 2.6 x 4.0 cm isoechoic partially cystic though predominantly solid mass replacing near the entirety of the left lobe of the thyroid (labeled 4) is grossly unchanged compared to the 2018 examination, previously, 4.5 x 2.7 x 4.0 cm, with slight differences attributable to scan plane projection. IMPRESSION: 1. Similar findings of multinodular goiter. No definitive worrisome new or enlarging thyroid nodules. 2. Previously biopsied approximately 5.0 cm nodule/mass replacing the left lobe of the thyroid is grossly unchanged compared to the 2018 examination. This nodule has been biopsied both in 2018 as well as 2006. Correlation with previous biopsy results is advised. Assuming a benign pathologic diagnosis, repeat sampling and/or continued dedicated follow-up is not recommended. 3. None of the remaining thyroid nodules meet imaging criteria to recommend percutaneous sampling or continued dedicated follow-up. The above is in keeping with the ACR TI-RADS recommendations - J Am Coll Radiol 2017;14:587-595. Electronically Signed   By: Sandi Mariscal M.D.   On: 04/23/2020 07:36   VAS [place] LOWER EXTREMITY VENOUS (DVT)  Result Date: 04/22/2020  Lower Venous DVTStudy Indications: Stroke.  Performing  Technologist: June Leap RDMS, RVT  Examination Guidelines: A complete evaluation includes B-mode imaging, spectral Doppler, color Doppler, and power Doppler as needed of all accessible portions of each vessel. Bilateral testing is considered an integral part of a complete examination. Limited  examinations for reoccurring indications may be performed as noted. The reflux portion of the exam is performed with the patient in reverse Trendelenburg.  +---------+---------------+---------+-----------+----------+--------------+ RIGHT    CompressibilityPhasicitySpontaneityPropertiesThrombus Aging +---------+---------------+---------+-----------+----------+--------------+ CFV      Full           Yes      Yes                                 +---------+---------------+---------+-----------+----------+--------------+ SFJ      Full                                                        +---------+---------------+---------+-----------+----------+--------------+ FV Prox  Full                                                        +---------+---------------+---------+-----------+----------+--------------+ FV Mid   Full                                                        +---------+---------------+---------+-----------+----------+--------------+ FV DistalFull                                                        +---------+---------------+---------+-----------+----------+--------------+ PFV      Full                                                        +---------+---------------+---------+-----------+----------+--------------+ POP      Full           Yes      Yes                                 +---------+---------------+---------+-----------+----------+--------------+ PTV      Full                                                        +---------+---------------+---------+-----------+----------+--------------+ PERO     Full                                                         +---------+---------------+---------+-----------+----------+--------------+   +---------+---------------+---------+-----------+----------+--------------+ LEFT     CompressibilityPhasicitySpontaneityPropertiesThrombus Aging +---------+---------------+---------+-----------+----------+--------------+ CFV      Full  Yes      Yes                                 +---------+---------------+---------+-----------+----------+--------------+ SFJ      Full                                                        +---------+---------------+---------+-----------+----------+--------------+ FV Prox  Full                                                        +---------+---------------+---------+-----------+----------+--------------+ FV Mid   Full                                                        +---------+---------------+---------+-----------+----------+--------------+ FV DistalFull                                                        +---------+---------------+---------+-----------+----------+--------------+ PFV      Full                                                        +---------+---------------+---------+-----------+----------+--------------+ POP      Full           Yes      Yes                                 +---------+---------------+---------+-----------+----------+--------------+ PTV      Full                                                        +---------+---------------+---------+-----------+----------+--------------+ PERO     Full                                                        +---------+---------------+---------+-----------+----------+--------------+     Summary: RIGHT: - There is no evidence of deep vein thrombosis in the lower extremity.  - No cystic structure found in the popliteal fossa.  LEFT: - There is no evidence of deep vein thrombosis in the lower extremity.  - No cystic structure found in  the popliteal fossa.  *See table(s) above for measurements and observations. Electronically signed by Servando Snare MD on 04/22/2020 at 5:27:33  PM.    Final        Discharge Exam: Vitals:   04/22/20 2145 04/23/20 0810  BP: (!) 175/82 (!) 180/95  Pulse: 82 83  Resp: 16 19  Temp: 99.1 F (37.3 C) 98.6 F (37 C)  SpO2: 95% 97%    General: Pt is alert, awake, not in acute distress Cardiovascular: RRR, S1/S2 +, no edema Respiratory: CTA bilaterally, no wheezing, no rhonchi, no respiratory distress, no conversational dyspnea  Abdominal: Soft, NT, ND, bowel sounds + Extremities: no edema, no cyanosis Psych: Normal mood and affect, stable judgement and insight     The results of significant diagnostics from this hospitalization (including imaging, microbiology, ancillary and laboratory) are listed below for reference.     Microbiology: Recent Results (from the past 240 hour(s))  Respiratory Panel by RT PCR (Flu A&B, Covid) - Nasopharyngeal Swab     Status: None   Collection Time: 04/21/20 10:51 PM   Specimen: Nasopharyngeal Swab  Result Value Ref Range Status   SARS Coronavirus 2 by RT PCR NEGATIVE NEGATIVE Final    Comment: (NOTE) SARS-CoV-2 target nucleic acids are NOT DETECTED. The SARS-CoV-2 RNA is generally detectable in upper respiratoy specimens during the acute phase of infection. The lowest concentration of SARS-CoV-2 viral copies this assay can detect is 131 copies/mL. A negative result does not preclude SARS-Cov-2 infection and should not be used as the sole basis for treatment or other patient management decisions. A negative result may occur with  improper specimen collection/handling, submission of specimen other than nasopharyngeal swab, presence of viral mutation(s) within the areas targeted by this assay, and inadequate number of viral copies (<131 copies/mL). A negative result must be combined with clinical observations, patient history, and epidemiological  information. The expected result is Negative. Fact Sheet for Patients:  PinkCheek.be Fact Sheet for Healthcare Providers:  GravelBags.it This test is not yet ap proved or cleared by the [place] FDA and  has been authorized for detection and/or diagnosis of SARS-CoV-2 by FDA under an Emergency Use Authorization (EUA). This EUA will remain  in effect (meaning this test can be used) for the duration of the COVID-19 declaration under Section 564(b)(1) of the Act, 21 U.S.C. section 360bbb-3(b)(1), unless the authorization is terminated or revoked sooner.    Influenza A by PCR NEGATIVE NEGATIVE Final   Influenza B by PCR NEGATIVE NEGATIVE Final    Comment: (NOTE) The Xpert Xpress SARS-CoV-2/FLU/RSV assay is intended as an aid in  the diagnosis of influenza from Nasopharyngeal swab specimens and  should not be used as a sole basis for treatment. Nasal washings and  aspirates are unacceptable for Xpert Xpress SARS-CoV-2/FLU/RSV  testing. Fact Sheet for Patients: PinkCheek.be Fact Sheet for Healthcare Providers: GravelBags.it This test is not yet approved or cleared by the [place] FDA and  has been authorized for detection and/or diagnosis of SARS-CoV-2 by  FDA under an Emergency Use Authorization (EUA). This EUA will remain  in effect (meaning this test can be used) for the duration of the  Covid-19 declaration under Section 564(b)(1) of the Act, 21  U.S.C. section 360bbb-3(b)(1), unless the authorization is  terminated or revoked. Performed at [facility], [place] [address] [place], [place] [address]   Culture, Urine     Status: Abnormal   Collection Time: 04/21/20 11:12 PM   Specimen: Urine  Result Value Ref Range Status   Specimen Description   Final    Urine Performed at [facility],  [place] [address], [place],  [place] [address]    Special Requests   Final    NONE Performed at [facility], [place] [address] [place], [place] [address]    Culture MULTIPLE SPECIES PRESENT, SUGGEST RECOLLECTION (A)  Final   Report Status 04/22/2020 FINAL  Final     Labs: BNP (last 3 results) No results for input(s): BNP in the last 8760 hours. Basic Metabolic Panel: Recent Labs  Lab 04/21/20 1937 04/21/20 2008 04/21/20 2312 04/22/20 0800  NA 139 140  --  140  K 3.4* 3.3*  --  3.5  CL 96* 94*  --  97*  CO2 31  --   --  31  GLUCOSE 143* 133*  --  101*  BUN 16 15  --  15  CREATININE 1.46* 1.30*  --  1.35*  CALCIUM 9.0  --   --  9.3  MG  --   --  2.3  --    Liver Function Tests: Recent Labs  Lab 04/21/20 1937  AST 16  ALT 12  ALKPHOS 74  BILITOT 0.5  PROT 7.3  ALBUMIN 3.7   No results for input(s): LIPASE, AMYLASE in the last 168 hours. No results for input(s): AMMONIA in the last 168 hours. CBC: Recent Labs  Lab 04/21/20 1937 04/21/20 2008  WBC 7.7  --   NEUTROABS 5.6  --   HGB 14.5 15.6*  HCT 45.4 46.0  MCV 91.9  --   PLT 283  --    Cardiac Enzymes: No results for input(s): CKTOTAL, CKMB, CKMBINDEX, TROPONINI in the last 168 hours. BNP: Invalid input(s): POCBNP CBG: No results for input(s): GLUCAP in the last 168 hours. D-Dimer No results for input(s): DDIMER in the last 72 hours. Hgb A1c Recent Labs    04/22/20 0810  HGBA1C 5.9*   Lipid Profile Recent Labs    04/22/20 0800  CHOL 194  HDL 49  LDLCALC 112*  TRIG 166*  CHOLHDL 4.0   Thyroid function studies Recent Labs    04/21/20 1937  TSH 1.081   Anemia work up No results for input(s): VITAMINB12, FOLATE, FERRITIN, TIBC, IRON, RETICCTPCT in the last 72 hours. Urinalysis    Component Value Date/Time   COLORURINE YELLOW 04/21/2020 1916   APPEARANCEUR CLOUDY (A) 04/21/2020 1916   LABSPEC 1.016 04/21/2020 1916   PHURINE 7.0 04/21/2020 1916   GLUCOSEU NEGATIVE 04/21/2020 1916   HGBUR NEGATIVE  04/21/2020 1916   BILIRUBINUR NEGATIVE 04/21/2020 1916   KETONESUR NEGATIVE 04/21/2020 1916   PROTEINUR 100 (A) 04/21/2020 1916   UROBILINOGEN 0.2 12/13/2012 2155   NITRITE NEGATIVE 04/21/2020 1916   LEUKOCYTESUR LARGE (A) 04/21/2020 1916   Sepsis Labs Invalid input(s): PROCALCITONIN,  WBC,  LACTICIDVEN Microbiology Recent Results (from the past 240 hour(s))  Respiratory Panel by RT PCR (Flu A&B, Covid) - Nasopharyngeal Swab     Status: None   Collection Time: 04/21/20 10:51 PM   Specimen: Nasopharyngeal Swab  Result Value Ref Range Status   SARS Coronavirus 2 by RT PCR NEGATIVE NEGATIVE Final    Comment: (NOTE) SARS-CoV-2 target nucleic acids are NOT DETECTED. The SARS-CoV-2 RNA is generally detectable in upper respiratoy specimens during the acute phase of infection. The lowest concentration of SARS-CoV-2 viral copies this assay can detect is 131 copies/mL. A negative result does not preclude SARS-Cov-2 infection and should not be used as the sole basis for treatment or other patient management decisions. A negative result may occur with  improper specimen  collection/handling, submission of specimen other than nasopharyngeal swab, presence of viral mutation(s) within the areas targeted by this assay, and inadequate number of viral copies (<131 copies/mL). A negative result must be combined with clinical observations, patient history, and epidemiological information. The expected result is Negative. Fact Sheet for Patients:  PinkCheek.be Fact Sheet for Healthcare Providers:  GravelBags.it This test is not yet ap proved or cleared by the [place] FDA and  has been authorized for detection and/or diagnosis of SARS-CoV-2 by FDA under an Emergency Use Authorization (EUA). This EUA will remain  in effect (meaning this test can be used) for the duration of the COVID-19 declaration under Section 564(b)(1) of the Act, 21  U.S.C. section 360bbb-3(b)(1), unless the authorization is terminated or revoked sooner.    Influenza A by PCR NEGATIVE NEGATIVE Final   Influenza B by PCR NEGATIVE NEGATIVE Final    Comment: (NOTE) The Xpert Xpress SARS-CoV-2/FLU/RSV assay is intended as an aid in  the diagnosis of influenza from Nasopharyngeal swab specimens and  should not be used as a sole basis for treatment. Nasal washings and  aspirates are unacceptable for Xpert Xpress SARS-CoV-2/FLU/RSV  testing. Fact Sheet for Patients: PinkCheek.be Fact Sheet for Healthcare Providers: GravelBags.it This test is not yet approved or cleared by the [place] FDA and  has been authorized for detection and/or diagnosis of SARS-CoV-2 by  FDA under an Emergency Use Authorization (EUA). This EUA will remain  in effect (meaning this test can be used) for the duration of the  Covid-19 declaration under Section 564(b)(1) of the Act, 21  U.S.C. section 360bbb-3(b)(1), unless the authorization is  terminated or revoked. Performed at [facility], [place] [address], [place], [place] [address]   Culture, Urine     Status: Abnormal   Collection Time: 04/21/20 11:12 PM   Specimen: Urine  Result Value Ref Range Status   Specimen Description   Final    Urine Performed at [place] [address] [place], [place] [address]    Special Requests   Final    NONE Performed at [facility], [place] [address] [place],  [address]    Culture MULTIPLE SPECIES PRESENT, SUGGEST RECOLLECTION (A)  Final   Report Status 04/22/2020 FINAL  Final     Patient was seen and examined on the day of discharge and was found to be in stable condition. Time coordinating discharge: 25 minutes including assessment and coordination of care, as well as examination of the patient.   SIGNED:  Dessa Phi, DO [facility] 04/23/2020, 10:17 AM

## 2020-04-23 NOTE — Progress Notes (Signed)
Physical Therapy Treatment Patient Details Name: Sara Aguirre MRN: [id number] DOB: [birth year] Today's Date: 04/23/2020    History of Present Illness Sara Aguirre is a 79 y.o. 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     PT Comments    Patient progressing with ambulation with facilitation for improved upright posture with no episodes of scissoring.  She was pleaseant and eager to leave today once loop recorder is completed.  She will have assist and feel best follow up as noted below.  Will follow up if not d/c.    Follow Up Recommendations  Outpatient PT(if has transportation vs. HHPT)     Equipment Recommendations  None recommended by PT    Recommendations for Other Services       Precautions / Restrictions Precautions Precautions: Fall    Mobility  Bed Mobility               General bed mobility comments: up in chair  Transfers Overall transfer level: Needs assistance Equipment used: None Transfers: Sit to/from Stand Sit to Stand: Min guard         General transfer comment: some initial imbalance upon standing with minguard for safety as reaching for counter on sink to steady  Ambulation/Gait Ambulation/Gait assistance: Min assist(for facilitation) Gait Distance (Feet): 400 Feet Assistive device: None       General Gait Details: facilitation for L hip depression and R shoulder elevation due to R lateral lean initially and for trunk rotation and armswing with cues; worked on speed  changes, head turns and keeping straight Geographical information systems officer Rankin (Stroke Patients Only) Modified Rankin (Stroke Patients Only) Pre-Morbid Rankin Score: Slight disability Modified Rankin: Moderately severe disability     Balance Overall balance assessment: Needs assistance Sitting-balance support: Feet supported Sitting balance-Leahy Scale: Good     Standing balance support: No upper extremity supported Standing balance-Leahy Scale: Fair Standing balance comment: static balance fair, with dynamic activity needs support               High Level Balance Comments: at rail in hallway performed forward tandem gait, side stepping (no UE support, cues for keeping same path), and toe walking            Cognition Arousal/Alertness: Awake/alert Behavior During Therapy: WFL for tasks assessed/performed Overall Cognitive Status: Within Functional Limits for tasks assessed                                        Exercises      General Comments        Pertinent Vitals/Pain Pain Assessment: No/denies pain    Home Living                      Prior Function            PT Goals (current goals can now be  found in the care plan section) Progress towards PT goals: Progressing toward goals    Frequency    Min 4X/week      PT Plan Current plan remains appropriate    Co-evaluation              AM-PAC PT "6 Clicks" Mobility   Outcome Measure  Help needed turning from your back to your side while in a flat bed without using bedrails?: None Help needed moving from lying on your back to sitting on the side of a flat bed without using bedrails?: None Help needed moving to and from a bed to a chair (including a wheelchair)?: A Little Help needed standing up from a chair using your arms (e.g., wheelchair or bedside chair)?: A Little Help needed to walk in hospital room?: A Little Help needed  climbing 3-5 steps with a railing? : A Little 6 Click Score: 20    End of Session   Activity Tolerance: Patient tolerated treatment well Patient left: in chair;with call bell/phone within reach   PT Visit Diagnosis: Other abnormalities of gait and mobility (R26.89)     Time: 7989-2119 PT Time Calculation (min) (ACUTE ONLY): 20 min  Charges:  $Neuromuscular Re-education: 8-22 mins                     Magda Kiel, [place] [phone] 04/23/2020    Reginia Naas 04/23/2020, 4:15 PM

## 2020-04-23 NOTE — Consult Note (Addendum)
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  she has undergone workup for stroke including echocardiogram and carotid angio.  The patient has been monitored on telemetry which has demonstrated sinus rhythm with no arrhythmias.   Neurology service has deferred TEE   Echocardiogram this admission demonstrated IMPRESSIONS  1. Left ventricular ejection fraction, by estimation, is 55 to 60%. The  left ventricle has normal function. The left ventricle has no regional  wall motion abnormalities. There is mild left ventricular hypertrophy.  Left ventricular diastolic parameters  are consistent with Grade I diastolic dysfunction (impaired relaxation).  2. Right ventricular systolic function is normal. The right ventricular  size is normal. There is normal pulmonary artery systolic pressure. The  estimated right ventricular systolic pressure is 11.9 mmHg.  3. The mitral valve is degenerative. No evidence of mitral valve  regurgitation. No evidence of mitral stenosis.  4. The aortic valve is tricuspid. Aortic valve regurgitation is mild.  Mild aortic valve sclerosis is present, with no evidence of aortic valve  stenosis.  5. The inferior vena cava is normal in size with greater than 50%  respiratory variability, suggesting right atrial pressure of 3 mmHg.     Lab work is  reviewed.  Prior to admission, the patient denies chest pain, shortness of breath, dizziness, palpitations, or syncope.   She mentions a year ago her BP cuff once gave an indication that her heart was irregular, but she felt nothing, suspected was an error.  This only happened the one time She is recovering from their stroke with plans to home at discharge.   Past Medical History:  Diagnosis Date  . Cancer [facility])    bladder, s/p cystectomy with ileal conduit  . History of bladder cancer   . History of tobacco use   . Hyperlipidemia   . Hypertension   . Incarcerated ventral hernia, s/p lap repair 12/05/2011  . Parastomal hernia of ileal conduit, s/p lap repair [id number] 12/04/2011  . Ulcer, gastric, acute or chronic      Surgical History:  Past Surgical History:  Procedure Laterality Date  . BLADDER REMOVAL  10/1993   Dr. Risa Grill  . HERNIA REPAIR  12/05/11   parastomal  . PARASTOMAL HERNIA REPAIR  12/05/2011   Procedure: HERNIA REPAIR PARASTOMAL;  Surgeon: Adin Hector, MD;  Location: [facility] ORS;  Service: General;;  Laprascopic lysis of adhestons with reduction and repair with biological mesh for incarcerated parastomal and ventral long incisional hernia.  Marland Kitchen REVISION UROSTOMY CUTANEOUS    . VENTRAL HERNIA REPAIR  12/05/2011   Procedure: HERNIA REPAIR VENTRAL ADULT;  Surgeon: Adin Hector, MD;  Location: [facility];  Service: General;;     Medications Prior to Admission  Medication Sig Dispense Refill Last Dose  . amLODipine (NORVASC) 5 MG tablet Take 1 tablet (5 mg total) by mouth daily. 90 tablet 1 04/21/2020 at Unknown time  . aspirin EC 81 MG tablet Take 81 mg by mouth daily.   04/21/2020  at Unknown time  . hydrochlorothiazide (HYDRODIURIL) 25 MG tablet Take 1 tablet (25 mg total) by mouth daily. 90 tablet 1 04/21/2020 at Unknown time  . Blood Pressure Monitor DEVI Use to check blood pressure 3 times a week 1 Device 0   . UNABLE TO FIND HOLISTER UROSTOMY POUCHES ITEM [id number] Device 3      Inpatient Medications:  .  stroke: mapping our early stages of recovery book   Does not apply Once  . aspirin  325 mg Oral Daily  . atorvastatin  40 mg Oral Daily  . clopidogrel  75 mg Oral Daily  . enoxaparin (LOVENOX) injection  40 mg Subcutaneous QHS    Allergies:  Allergies  Allergen Reactions  . Imodium [Loperamide]     Itchy all over  . Loperamide Hcl Rash    Social History   Socioeconomic History  . Marital status: Widowed    Spouse name: Not on file  . Number of children: 2  . Years of education: Not on file  . Highest education level: Some college, no degree  Occupational History  . Occupation: retired  Tobacco Use  . Smoking status: Former Smoker    Quit date: 01/02/1993    Years since quitting: 27.3  . Smokeless tobacco: Never Used  Substance and Sexual Activity  . Alcohol use: No  . Drug use: No  . Sexual activity: Not on file  Other Topics Concern  . 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  Gardening      ADLs: drives; independent with ADLs; has garden      Advanced Directives: YES: full code but no prolonged measures; HCPOA: Orvis Brill or other son Cory Munch; copy on chart 04/2017.   Social Determinants of Health   Financial Resource Strain:   . Difficulty of Paying Living Expenses:   Food Insecurity:   . Worried About Charity fundraiser in the Last Year:   . Arboriculturist in the Last Year:   Transportation Needs:   . Film/video editor (Medical):   Marland Kitchen Lack of Transportation (Non-Medical):   Physical Activity:   . Days of Exercise per Week:   . Minutes of Exercise per Session:    Stress:   . Feeling of Stress :   Social Connections:   . Frequency of Communication with Friends and Family:   . Frequency of Social Gatherings with Friends and Family:   . Attends Religious Services:   . Active Member of Clubs or Organizations:   . Attends Archivist Meetings:   Marland Kitchen Marital Status:   Intimate Partner Violence:   . Fear of Current or Ex-Partner:   . Emotionally Abused:   Marland Kitchen Physically Abused:   . Sexually Abused:      Family History  Problem Relation Age of Onset  . Heart disease Mother 40       CHF  . Alcohol abuse Father   . Stroke Father       Review of Systems:  All other systems reviewed and are otherwise negative except as noted above.  Physical Exam: Vitals:   04/22/20 0909 04/22/20 1643 04/22/20 2145 04/23/20 0810  BP: (!) 156/70 (!) 159/75 (!) 175/82 (!) 180/95  Pulse: 88 88 82 83  Resp: 18 18 16 19   Temp: 98.2 F (36.8 C)  99.1 F (37.3 C) 98.6 F (37 C)  TempSrc:   Oral   SpO2: 93% 93% 95% 97%    GEN- The patient is well appearing, alert and oriented x 3 today.   Head- normocephalic, atraumatic Eyes-  Sclera clear, conjunctiva pink Ears- hearing intact Oropharynx- clear Neck- supple Lungs- CTA b/l, normal work of breathing Heart- RRR, no murmurs, rubs or gallops  GI- soft, NT, ND Extremities- no clubbing, cyanosis, or edema MS- no significant deformity or atrophy Skin- no rash or lesion Psych- euthymic mood, full affect   Labs:   Lab Results  Component Value Date   WBC 7.7 04/21/2020   HGB 15.6 (H) 04/21/2020   HCT 46.0 04/21/2020   MCV 91.9 04/21/2020   PLT 283 04/21/2020    Recent Labs  Lab 04/21/20 1937 04/21/20 2008 04/22/20 0800  NA 139   < > 140  K 3.4*   < > 3.5  CL 96*   < > 97*  CO2 31   < > 31  BUN 16   < > 15  CREATININE 1.46*   < > 1.35*  CALCIUM 9.0   < > 9.3  PROT 7.3  --   --   BILITOT 0.5  --   --   ALKPHOS 74  --   --   ALT 12  --   --   AST 16  --   --   GLUCOSE 143*   < > 101*    < > = values in this interval not displayed.   No results found for: CKTOTAL, CKMB, CKMBINDEX, TROPONINI Lab Results  Component Value Date   CHOL 194 04/22/2020   CHOL 278 (H) 10/27/2019   CHOL 280 (H) 12/25/2018   Lab Results  Component Value Date   HDL 49 04/22/2020   HDL 62 10/27/2019   HDL 78 12/25/2018   Lab Results  Component Value Date   LDLCALC 112 (H) 04/22/2020   LDLCALC 158 (H) 10/27/2019   LDLCALC 164 (H) 12/25/2018   Lab Results  Component Value Date   TRIG 166 (H) 04/22/2020   TRIG 310 (H) 10/27/2019   TRIG 188 (H) 12/25/2018   Lab Results  Component Value Date   CHOLHDL 4.0 04/22/2020   CHOLHDL 4.5 (H) 10/27/2019   CHOLHDL 3.6 12/25/2018   No results found for: LDLDIRECT  No results found for: DDIMER   Radiology/Studies:   CT Angio Head W or Wo Contrast Addendum Date: 04/21/2020   ADDENDUM REPORT: 04/21/2020 22:30 ADDENDUM: These results were called by telephone at the time of interpretation on 04/21/2020 at 9:21 pm to provider [facility] , who verbally acknowledged these results. Electronically Signed   By: Kellie Simmering DO   On: 04/21/2020 22:30  Result Date: 04/21/2020 CLINICAL DATA:  Neuro deficit, acute, stroke suspected. Left arm and left face numbness, headache, dizziness since yesterday. Possible right neck bruit. Rule out stroke or vascular abnormality. EXAM: CT ANGIOGRAPHY HEAD AND NECK TECHNIQUE: Multidetector CT imaging of the head and neck was performed using the standard protocol during bolus administration of intravenous contrast. Multiplanar CT image reconstructions and MIPs were obtained to evaluate the vascular anatomy. Carotid  stenosis measurements (when applicable) are obtained utilizing NASCET criteria, using the distal internal carotid diameter as the denominator. CONTRAST:  77mL OMNIPAQUE IOHEXOL 350 MG/ML SOLN COMPARISON:  Report from thyroid biopsy 05/24/2017, thyroid ultrasound 01/25/2017 FINDINGS: CT HEAD FINDINGS Brain: There  is moderate/advanced ill-defined hypoattenuation within the cerebral white matter which is nonspecific, but consistent with chronic small vessel ischemic disease. Moderate generalized parenchymal atrophy. There is no acute intracranial hemorrhage. No demarcated cortical infarct. No extra-axial fluid collection. No evidence of intracranial mass. No midline shift. Vascular: No hyperdense vessel.  Atherosclerotic calcifications. Skull: Normal. Negative for fracture or focal lesion. Sinuses: Mild ethmoid sinus mucosal thickening. No significant mastoid effusion. Orbits: No acute abnormality. Review of the MIP images confirms the above findings CTA NECK FINDINGS Aortic arch: Atherosclerotic plaque within the visualized aortic arch and proximal major branch vessels of the neck. The innominate, left common carotid artery and left vertebral artery origins are not included in the field of view. Right carotid system: The CCA is patent to the bifurcation without measurable stenosis. Calcified plaque within the carotid bifurcation with prominent mixed plaque at the origin of the ECA. Moderate mixed plaque within the proximal ICA with up to 30-40 % stenosis at this site. Distal to this, the ICA is patent within the neck without significant stenosis. Left carotid system: The origin of the left common carotid artery is excluded from the field of view. The CCA is displaced laterally by the large left thyroid nodule. The visualized CCA and ICA are patent within the neck without significant stenosis (50% or greater). Mild calcified plaque within the carotid bifurcation, proximal ICA and proximal ECA. Vertebral arteries: The dominant right vertebral artery is patent throughout the neck without significant stenosis (50% or greater). Mild calcified plaque at the vertebral artery origin and within the distal V2 segment. The non dominant left vertebral artery arises directly from the aortic arch. The origin of this vessel is not included  in the field of view. Apparent high-grade stenosis within the proximal V1 left vertebral artery, although this may be accentuated by motion artifact. Skeleton: No acute bony abnormality or aggressive osseous lesion. Cervical spondylosis with multilevel disc space narrowing, posterior disc osteophytes, uncovertebral and facet hypertrophy. Other neck: Redemonstrated large nodule within the left thyroid lobe measuring 4.4 x 3.9 cm in transaxial dimensions. By report this has not significantly changed in size as compared to thyroid ultrasound 05/24/2017 (previously measuring 4.2 x 3.0 x 4.0 cm). There are additional nodules more inferiorly within the left thyroid lobe measuring up to 16 mm not described previously. Upper chest: No consolidation within the imaged lung apices. Review of the MIP images confirms the above findings CTA HEAD FINDINGS Anterior circulation: The intracranial internal carotid arteries are patent bilaterally. Mild calcified plaque within these vessels with no more than mild stenosis. The M1 middle cerebral arteries are patent without significant stenosis. No M2 proximal branch occlusion or high-grade proximal stenosis is identified. Atherosclerotic irregularity of the M2 and more distal MCA branch vessels bilaterally. Most notably there are tandem moderate to moderately advanced stenoses within a proximal M2 left MCA branch vessel (series 16, images 23 and 24). The anterior cerebral arteries are patent without high-grade proximal stenosis. No intracranial aneurysm is identified. Posterior circulation: The dominant intracranial right vertebral artery is patent without significant stenosis. The non dominant left vertebral artery is developmentally diminutive beyond the origin of the left PICA, although patent. The basilar artery is patent without significant stenosis. Fetal origin right posterior cerebral artery.  There is focal occlusion or high-grade stenosis within the proximal right posterior  cerebral artery (series 13, images 75-77). Some flow is seen more distally within the right posterior cerebral artery, although this vessel remains markedly diminutive and irregular. The left posterior cerebral artery is patent without significant proximal stenosis. The left posterior communicating artery is hypoplastic or absent. Venous sinuses: Within limitations of contrast timing, no convincing thrombus. Anatomic variants: As described Review of the MIP images confirms the above findings IMPRESSION: CT head: 1. No evidence of acute intracranial abnormality. 2. Moderate/advanced chronic small vessel ischemic changes within the cerebral white matter. 3. Moderate generalized parenchymal atrophy. 4. Mild ethmoid sinus mucosal thickening. CTA neck: 1. The right common and internal carotid arteries are patent within the neck. Atherosclerotic plaque results in 30-40% stenosis of the proximal right ICA. 2. The origin of the left common carotid artery is excluded from the field of view. Within this limitation, the left common and internal carotid arteries are patent within the neck without significant stenosis. Mild calcified plaque at the left carotid bifurcation. 3. The dominant right vertebral artery is patent throughout the neck without significant stenosis. 4. The non-dominant left vertebral artery arises directly from the aortic arch. The origin of this vessel is not included in the field of view. There is apparent high-grade stenosis within the proximal V1 segment, although this may be accentuated by motion artifact. 5. A 4.4 cm left thyroid lobe nodule has not significantly changed in size since thyroid biopsy 05/24/2017 by report. Please correlate with previous pathology results. However, there are additional thyroid nodules within the inferior left thyroid lobe measuring up to 16 mm which were not previously described. Nonemergent thyroid ultrasound is recommended for further characterization. CTA head: 1. The  right posterior cerebral artery is fetal in origin. There is a focal occlusion or near-occlusive stenosis proximally within this vessel. Some flow is seen more distally within the right PCA, although distally this vessel remains markedly diminutive and irregular. 2. Atherosclerotic irregularity of the M2 and more distal MCA branch vessels bilaterally. Most notably, there are tandem moderate to moderately severe stenoses within a proximal M2 left MCA branch vessel. 3. Atherosclerotic plaque within the intracranial internal carotid arteries with no more than mild stenosis. Electronically Signed: By: Kellie Simmering DO On: 04/21/2020 21:21     MR BRAIN WO CONTRAST Result Date: 04/21/2020 CLINICAL DATA:  Neuro deficit, acute, stroke suspected. Additional provided: Numbness of left arm and left-sided facial numbness. EXAM: MRI HEAD WITHOUT CONTRAST TECHNIQUE: Multiplanar, multiecho pulse sequences of the brain and surrounding structures were obtained without intravenous contrast. COMPARISON:  CT angiogram head/neck performed earlier the same evening 04/21/2020. FINDINGS: Brain: The examination is intermittently motion degraded. Most notably, there is moderate motion degradation of the axial T2/FLAIR sequence. 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 There are additional patchy small acute infarcts more posteriorly within the medial right temporal lobe and right occipital lobe. Punctate acute infarct within the splenium of the corpus callosum on the right. Additionally, there are small acute lacunar infarcts within the right thalamus. Corresponding T2/FLAIR hyperintensity at these sites. Moderate/advanced patchy and confluent T2/FLAIR hyperintensity within the cerebral white matter which is nonspecific, but consistent with chronic small vessel ischemic disease. Mild chronic small vessel ischemic changes also present within the brainstem. Moderate  generalized parenchymal atrophy. No evidence of intracranial mass. No chronic intracranial blood products. No extra-axial fluid collection. No midline shift. Vascular: A focal occlusion or  near-occlusive stenosis was demonstrated within the proximal right posterior cerebral artery on CTA performed earlier the same day. Skull and upper cervical spine: No focal marrow lesion. Sinuses/Orbits: Visualized orbits show no acute finding. Mild ethmoid sinus mucosal thickening. No significant mastoid effusion. These results were called by telephone at the time of interpretation on 04/21/2020 at 10:15 pm to provider [facility] , who verbally acknowledged these results. IMPRESSION: 1. Motion degraded examination. 2. 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 Small acute lacunar infarcts are also present within the right thalamus. 3. Background moderate to advanced chronic small vessel ischemic changes predominantly within the cerebral white matter. 4. Moderate generalized parenchymal atrophy. Electronically Signed   By: Kellie Simmering DO   On: 04/21/2020 22:16    VAS [place] LOWER EXTREMITY VENOUS (DVT) Result Date: 04/22/2020  Lower Venous DVTStudy Indications: Stroke.  Performing Technologist: June Leap RDMS, RVT  Examination Guidelines: A complete evaluation includes B-mode imaging, spectral Doppler, color Doppler, and power Doppler as needed of all accessible portions of each vessel. Bilateral testing is considered an integral part of a complete examination. Limited examinations for reoccurring indications may be performed as noted. The reflux portion of the exam is performed with the patient in reverse Trendelenburg. Summary:  RIGHT: - There is no evidence of deep vein thrombosis in the lower extremity.  - No cystic structure found in the popliteal fossa.  LEFT: - There is no  evidence of deep vein thrombosis in the lower extremity.  - No cystic structure found in the popliteal fossa.  *See table(s) above for measurements and observations. Electronically signed by Servando Snare MD on 04/22/2020 at 5:27:33 PM.    Final     12-lead ECG SR All prior EKG's in EPIC reviewed with no documented atrial fibrillation  Telemetry SR  Assessment and Plan:  1. Cryptogenic stroke The patient presents with cryptogenic stroke.    I spoke at length with the patient about monitoring for afib with either a 30 day event monitor or an implantable loop recorder.  Risks, benefits, and alteratives to implantable loop recorder were discussed with the patient today.   At this time, she is very clear in her decision to proceed with implantable loop recorder.   Wound care was reviewed with the patient (keep incision clean and dry for 3 days).  Wound check will be scheduled for the patient  Please call with questions.   Baldwin Jamaica, PA-C 04/23/2020  As above  Gala Murdoch and examined as outlined  Discussed the role of monitoring for afib in the setting of Cryptogenic Stroke and the lack of prospective data on anticoagulation in pts with SCAF identified on monitoring, the strong data for the role of anticoagulation in patients with known afib and antecedent stroke  She voices understanding and agrees to proceed

## 2020-04-23 NOTE — Progress Notes (Signed)
STROKE TEAM PROGRESS NOTE   INTERVAL HISTORY Patient sitting in chair, no family at bedside.  OT has been working with her in room.  No acute event overnight.  Neuro stable.  Pending loop recorder.  Vitals:   04/22/20 0909 04/22/20 1643 04/22/20 2145 04/23/20 0810  BP: (!) 156/70 (!) 159/75 (!) 175/82 (!) 180/95  Pulse: 88 88 82 83  Resp: 18 18 16 19   Temp: 98.2 F (36.8 C)  99.1 F (37.3 C) 98.6 F (37 C)  TempSrc:   Oral   SpO2: 93% 93% 95% 97%    CBC:  Recent Labs  Lab 04/21/20 1937 04/21/20 2008  WBC 7.7  --   NEUTROABS 5.6  --   HGB 14.5 15.6*  HCT 45.4 46.0  MCV 91.9  --   PLT 283  --     Basic Metabolic Panel:  Recent Labs  Lab 04/21/20 1937 04/21/20 1937 04/21/20 2008 04/21/20 2312 04/22/20 0800  NA 139   < > 140  --  140  K 3.4*   < > 3.3*  --  3.5  CL 96*   < > 94*  --  97*  CO2 31  --   --   --  31  GLUCOSE 143*   < > 133*  --  101*  BUN 16   < > 15  --  15  CREATININE 1.46*   < > 1.30*  --  1.35*  CALCIUM 9.0  --   --   --  9.3  MG  --   --   --  2.3  --    < > = values in this interval not displayed.   Lipid Panel:     Component Value Date/Time   CHOL 194 04/22/2020 0800   CHOL 278 (H) 10/27/2019 1211   TRIG 166 (H) 04/22/2020 0800   HDL 49 04/22/2020 0800   HDL 62 10/27/2019 1211   CHOLHDL 4.0 04/22/2020 0800   VLDL 33 04/22/2020 0800   LDLCALC 112 (H) 04/22/2020 0800   LDLCALC 158 (H) 10/27/2019 1211   HgbA1c:  Lab Results  Component Value Date   HGBA1C 5.9 (H) 04/22/2020   Urine Drug Screen: No results found for: LABOPIA, COCAINSCRNUR, LABBENZ, AMPHETMU, THCU, LABBARB  Alcohol Level No results found for: ETH  IMAGING past 24 hours US THYROID  Result Date: 04/23/2020 CLINICAL DATA:  Prior ultrasound follow-up. Follow-up thyroid nodules. History of ultrasound-guided fine-needle aspiration of dominant left-sided thyroid nodule performed 09/04/2005 and 05/24/2017 EXAM: THYROID ULTRASOUND TECHNIQUE: Ultrasound examination of the  thyroid gland and adjacent soft tissues was performed. COMPARISON:  01/25/2017; 08/02/2005; ultrasound-guided left thyroid nodule fine-needle aspiration-09/04/2005; 05/24/2017 FINDINGS: Parenchymal Echotexture: Mildly heterogenous Isthmus: Normal in size measuring 0.2 cm in diameter, unchanged Right lobe: Normal in size measuring 5.0 x 1.1 x 4.1 cm, previously, 5.0 x 1.4 x 1.9 cm Left lobe: Borderline enlarged measuring 5.6 x 3.3 x 3.7 cm, previously, 5.4 x 3.6 x 4.3 cm _________________________________________________________ Estimated total number of nodules >/= 1 cm: 1 Number of spongiform nodules >/=  2 cm not described below (TR1): 0 Number of mixed cystic and solid nodules >/= 1.5 cm not described below (TR2): 0 _________________________________________________________ There is an approximately 0.8 cm spongiform/benign-appearing nodule within the superior pole the right lobe of the thyroid (labeled 1) which is unchanged compared to the 01/2017 examination, previously, 0.7 cm, and again does not meet criteria to recommend percutaneous sampling or continued dedicated follow-up. There is an approximately 0.9 cm  isoechoic ill-defined nodule within the inferior pole the right lobe of the thyroid (labeled 2), which is unchanged compared to the 2018 examination, previously, 0.8 cm, and again does not meet criteria to recommend percutaneous sampling or continued dedicated follow-up. There is an approximately 0.6 cm spongiform/benign-appearing nodule within the inferior pole the right lobe of the thyroid (labeled 3), which is unchanged compared to the 2018 examination, previously, 0.6 cm, and again does not meet criteria to recommend percutaneous sampling or continued dedicated follow-up _________________________________________________________ Previously biopsied approximately 5.0 x 2.6 x 4.0 cm isoechoic partially cystic though predominantly solid mass replacing near the entirety of the left lobe of the thyroid  (labeled 4) is grossly unchanged compared to the 2018 examination, previously, 4.5 x 2.7 x 4.0 cm, with slight differences attributable to scan plane projection. IMPRESSION: 1. Similar findings of multinodular goiter. No definitive worrisome new or enlarging thyroid nodules. 2. Previously biopsied approximately 5.0 cm nodule/mass replacing the left lobe of the thyroid is grossly unchanged compared to the 2018 examination. This nodule has been biopsied both in 2018 as well as 2006. Correlation with previous biopsy results is advised. Assuming a benign pathologic diagnosis, repeat sampling and/or continued dedicated follow-up is not recommended. 3. None of the remaining thyroid nodules meet imaging criteria to recommend percutaneous sampling or continued dedicated follow-up. The above is in keeping with the ACR TI-RADS recommendations - J Am Coll Radiol 2017;14:587-595. Electronically Signed   By: Sandi Mariscal M.D.   On: 04/23/2020 07:36    PHYSICAL EXAM   Temp:  [98.6 F (37 C)-99.1 F (37.3 C)] 98.6 F (37 C) (05/07 0810) Pulse Rate:  [82-88] 83 (05/07 0810) Resp:  [16-19] 19 (05/07 0810) BP: (159-180)/(75-95) 180/95 (05/07 0810) SpO2:  [93 %-97 %] 97 % (05/07 0810)  General - Well nourished, well developed, in no apparent distress.  Ophthalmologic - fundi not visualized due to noncooperation.  Cardiovascular - Regular rhythm and rate.  Mental Status -  Level of arousal and orientation to time, place, and person were intact. Language including expression, naming, repetition, comprehension was assessed and found intact.  Cranial Nerves II - XII - II - Visual field left upper quadrantanopia  III, IV, VI - Extraocular movements intact. V - Facial sensation decreased on the left, about 90% of the right. VII - Facial movement intact bilaterally. VIII - Hearing & vestibular intact bilaterally. X - Palate elevates symmetrically. XI - Chin turning & shoulder shrug intact bilaterally. XII -  Tongue protrusion intact.  Motor Strength - The patient's strength was normal in all extremities and pronator drift was absent.  Bulk was normal and fasciculations were absent.   Motor Tone - Muscle tone was assessed at the neck and appendages and was normal.  Reflexes - The patient's reflexes were symmetrical in all extremities and she had no pathological reflexes.  Sensory - Light touch, temperature/pinprick were assessed and were decreased on the left UE, about 90% of the right.    Coordination - The patient had normal movements in the hands with no ataxia or dysmetria.  Tremor was absent.  Gait and Station - deferred.   ASSESSMENT/PLAN Sara Aguirre is a 79 y.o. 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  Stroke:   R PCA including R thalamic infarcts, embolic secondary to unknown source  CT head No acute  abnormality.  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 Atrophy. Sinus dz.     CTA head fetal R PCA w/ occlusion/near occlusive stenosis. B M2 stenosis w/ severe stenoses proximal L M2. B ICA w/ mild atherosclerosis.   CTA neck R ICA  30-40% stenosis. L ICA w/ mild plaque (origin not seen). Proximal L V1 high-grade stenosis vs artifact.  MRI  R PCA territory infarcts. Small R thalamic infarcts. Small vessel disease. Atrophy.   LE Doppler no DVT  2D Echo EF 55-60%, no RLS  implantable loop recorder placed 5/7 to evaluate for atrial fibrillation as etiology of stroke    LDL 112  HgbA1c 5.9  Lovenox 40 mg sq daily for VTE prophylaxis  aspirin 81 mg daily prior to admission, now on aspirin 325 mg daily and clopidogrel 75 mg daily. Continue DAPT x 3 months then plavix alone given PCA occlusion   Therapy recommendations:  HH vs OP PT. No OT or SLP.  Disposition:  Return home [place] for  d/c from stroke standpoint following loop placement Follow-up Stroke Clinic at [facility] in 4 weeks. Office will call with appointment date and time. Order placed.  Hypertensive Urgency  BP as high as 198/114   Stable now . Permissive hypertension (OK if < 220/120) but gradually normalize in 2-3 days . 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 14 old, several new. [place] previously bx nodule stable. multinodular goiter. Not worrisome    Hospital day # 2  Neurology will sign off. Please call with questions. Pt will follow up with stroke clinic NP at [facility] in about 4 weeks. Thanks for the consult.   Rosalin Hawking, MD PhD Stroke Neurology 04/23/2020 3:14 PM    To contact Stroke Continuity provider, please refer to [email or web address] After hours, contact General Neurology

## 2020-04-23 NOTE — Discharge Instructions (Signed)
Wound care instructions Keep incision clean and dry for 3 days. You can remove outer dressing tomorrow. Leave steri-strips (little pieces of tape) on until seen in the office for wound check appointment. Call the office ([phone]) for redness, drainage, swelling, or fever.  

## 2020-04-23 NOTE — Progress Notes (Signed)
Order in place to discharge patient home.  AVS completed, printed and reviewed with patient and her son.  All questions answered.  AVS paperwork given to patient to take home.  Receiver for implanted loop recorder given to patient to take home.  Education on loop recorder and receiver was provided after loop recorder was implanted.  Cardiac monitoring and PIV discontinued.  Patient escorted to main entrance via wheelchair.  Son to transport patient home.

## 2020-04-26 ENCOUNTER — Ambulatory Visit: Payer: PPO | Admitting: Family Medicine

## 2020-04-28 ENCOUNTER — Telehealth: Payer: PPO | Admitting: Family Medicine

## 2020-04-28 ENCOUNTER — Encounter: Payer: Self-pay | Admitting: Family Medicine

## 2020-04-28 NOTE — Patient Instructions (Signed)
° ° ° °  If you have lab work done today you will be contacted with your lab results within the next 2 weeks.  If you have not heard from us then please contact us. The fastest way to get your results is to register for My Chart. ° ° °IF you received an x-ray today, you will receive an invoice from [facility]. Please contact [facility] at [phone] with questions or concerns regarding your invoice.  ° °IF you received labwork today, you will receive an invoice from [facility]. Please contact [facility] at [phone] with questions or concerns regarding your invoice.  ° °Our billing staff will not be able to assist you with questions regarding bills from these companies. ° °You will be contacted with the lab results as soon as they are available. The fastest way to get your results is to activate your My Chart account. Instructions are located on the last page of this paperwork. If you have not heard from us regarding the results in 2 weeks, please contact this office. °  ° ° ° °

## 2020-05-06 ENCOUNTER — Other Ambulatory Visit: Payer: Self-pay

## 2020-05-06 ENCOUNTER — Ambulatory Visit (INDEPENDENT_AMBULATORY_CARE_PROVIDER_SITE_OTHER): Payer: PPO | Admitting: Emergency Medicine

## 2020-05-06 DIAGNOSIS — I639 Cerebral infarction, unspecified: Secondary | ICD-10-CM

## 2020-05-06 LAB — CUP PACEART INCLINIC DEVICE CHECK
Date Time Interrogation Session: 20210520171836
Implantable Pulse Generator Implant Date: 20210507

## 2020-05-06 NOTE — Progress Notes (Signed)
ILR wound check in clinic. Steri strips removed. Wound well healed. R-waves 1.13 mV. Home monitor transmitting nightly. No episodes. Questions answered.

## 2020-05-07 ENCOUNTER — Other Ambulatory Visit: Payer: Self-pay | Admitting: Family Medicine

## 2020-05-07 DIAGNOSIS — I10 Essential (primary) hypertension: Secondary | ICD-10-CM

## 2020-05-25 ENCOUNTER — Ambulatory Visit (INDEPENDENT_AMBULATORY_CARE_PROVIDER_SITE_OTHER): Payer: PPO | Admitting: *Deleted

## 2020-05-25 DIAGNOSIS — I639 Cerebral infarction, unspecified: Secondary | ICD-10-CM

## 2020-05-25 LAB — CUP PACEART REMOTE DEVICE CHECK
Date Time Interrogation Session: 20210608004531
Implantable Pulse Generator Implant Date: 20210507

## 2020-05-26 ENCOUNTER — Ambulatory Visit: Payer: PPO | Admitting: Adult Health

## 2020-05-26 ENCOUNTER — Encounter: Payer: Self-pay | Admitting: Adult Health

## 2020-05-26 VITALS — BP 152/84 | HR 80 | Ht 68.0 in | Wt 155.0 lb

## 2020-05-26 DIAGNOSIS — I639 Cerebral infarction, unspecified: Secondary | ICD-10-CM

## 2020-05-26 DIAGNOSIS — Z95818 Presence of other cardiac implants and grafts: Secondary | ICD-10-CM

## 2020-05-26 DIAGNOSIS — E785 Hyperlipidemia, unspecified: Secondary | ICD-10-CM | POA: Diagnosis not present

## 2020-05-26 DIAGNOSIS — I10 Essential (primary) hypertension: Secondary | ICD-10-CM | POA: Diagnosis not present

## 2020-05-26 NOTE — Progress Notes (Signed)
[facility] Summary Report / Loop Recorder 

## 2020-05-26 NOTE — Progress Notes (Signed)
[facility] [address] [place] [address]. Sara Aguirre [address] [phone]       [name] Date of Birth:  1941-08-01 Medical Record Number:  [id number]   Reason for Referral:  hospital stroke follow up    SUBJECTIVE:   CHIEF COMPLAINT:  Chief Complaint  Patient presents with   Follow-up    Pt here for a stroke f/u. Pt said her tongue is numb and fingers are numb.    HPI:   [name] is a 79 y.o. 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  Eventually transferred to [facility] ED with stroke work-up revealing right PCA (mesial right temporal lobe/hippocampus, medial right temporal lobe, right occipital lobe and callosal splenium) and right thalamic infarcts, embolic secondary to unknown source.  CTA showed fetal right PCA occlusion/near occlusive stenosis and bilateral M2 stenosis with severe stenosis proximal left M2.  Loop recorder placed to evaluate for atrial fibrillation as stroke etiology.  Recommended DAPT for 3 months then Plavix alone due to PCA occlusion.  Presented with hypertensive urgency with BP 198/114 stabilized during admission with long-term BP goal normotensive range.  LDL 112 and increased atorvastatin dose from 20 mg to 40 mg daily.  Other stroke risk factors include advanced age, former tobacco use and family history of stroke but no personal history of stroke.  Other active problems include hx of bladder cancer s/p cystectomy w/ ileal conduit 1994, CKD stage III and known thyroid nodules.  Residual deficits of left upper quadrantanopia and decreased sensory with therapy recommending home health versus outpatient PT and discharged home in stable condition.  Stroke:   R PCA including R thalamic infarcts, embolic secondary to unknown  source  CT head No acute abnormality.  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 Atrophy. Sinus dz.     CTA head fetal R PCA w/ occlusion/near occlusive stenosis. B M2 stenosis w/ severe stenoses proximal L M2. B ICA w/ mild atherosclerosis.   CTA neck R ICA  30-40% stenosis. L ICA w/ mild plaque (origin not seen). Proximal L V1 high-grade stenosis vs artifact.  MRI  R PCA territory infarcts. Small R thalamic infarcts. Small vessel disease. Atrophy.   LE Doppler no DVT  2D Echo EF 55-60%, no RLS  implantable loop recorder placed 5/7 to evaluate for atrial fibrillation as etiology of stroke    LDL 112  HgbA1c 5.9  Lovenox 40 mg sq daily for VTE prophylaxis  aspirin 81 mg daily prior to admission, now on aspirin 325 mg daily and clopidogrel 75 mg daily. Continue DAPT x 3 months then plavix alone given PCA occlusion   Therapy recommendations:  HH vs OP PT. No OT or SLP.  Disposition:  Return home  Today, 05/26/2020, Sara Aguirre is being seen for hospital follow-up accompanied by her son.  Residual deficits of numbness on the side of her tongue and left fingers as well as mild imbalance and visual impairment.  Son endorses initially cognitive concerns and moderate gait abnormality which has improved over the past week.  She did not participate in any type of therapy after discharge.  She is questioning return to driving.  She has been slowly returning to independent level functioning.  Continues on aspirin 325 mg daily and Plavix 75 mg daily without bleeding  but easy bruising and plans on continuing over the next 2 months.  Continues on atorvastatin 40 mg daily without myalgias.  Blood pressure today 152/84. Monitors at home and typically 140s/80s.  Loop recorder has not shown atrial fibrillation thus far.  No further concerns at this time.       ROS:   14 system review of systems performed and negative with exception of gait impairment, decreased vision  PMH:  Past Medical  History:  Diagnosis Date   Cancer ([place])    bladder, s/p cystectomy with ileal conduit   History of bladder cancer    History of tobacco use    Hyperlipidemia    Hypertension    Incarcerated ventral hernia, s/p lap repair 12/05/2011   Parastomal hernia of ileal conduit, s/p lap repair [id number] 12/04/2011   Ulcer, gastric, acute or chronic     PSH:  Past Surgical History:  Procedure Laterality Date   BLADDER REMOVAL  10/1993   Dr. Risa Grill   HERNIA REPAIR  12/05/11   parastomal   LOOP RECORDER INSERTION N/A 04/23/2020   Procedure: LOOP RECORDER INSERTION;  Surgeon: Deboraha Sprang, MD;  Location: [facility];  Service: Cardiovascular;  Laterality: N/A;   PARASTOMAL HERNIA REPAIR  12/05/2011   Procedure: HERNIA REPAIR PARASTOMAL;  Surgeon: Adin Hector, MD;  Location: [facility];  Service: General;;  Laprascopic lysis of adhestons with reduction and repair with biological mesh for incarcerated parastomal and ventral long incisional hernia.   REVISION UROSTOMY CUTANEOUS     VENTRAL HERNIA REPAIR  12/05/2011   Procedure: HERNIA REPAIR VENTRAL ADULT;  Surgeon: Adin Hector, MD;  Location: [facility];  Service: General;;    Social History:  Social History   Socioeconomic History   Marital status: Widowed    Spouse name: Not on file   Number of children: 2   Years of education: Not on file   Highest education level: Some college, no degree  Occupational History   Occupation: retired  Tobacco Use   Smoking status: Former Smoker    Quit date: 01/02/1993    Years since quitting: 27.4   Smokeless tobacco: Never Used  Substance and Sexual Activity   Alcohol use: No   Drug use: No   Sexual activity: Not on file  Other Topics Concern   Not on file  Social History Narrative   Marital status: widowed since 1992; not dating; not interested      Children:  Twin boys (85); 2 grandchildren      Lives: alone with dog, 5 chickens, 2 rabbits      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  Gardening      ADLs: drives; independent with ADLs; has garden      Advanced Directives: YES: full code but no prolonged measures; HCPOA: Orvis Brill or other son Cory Munch; copy on chart 04/2017.   Social Determinants of Health   Financial Resource Strain:    Difficulty of Paying Living Expenses:   Food Insecurity:    Worried About Charity fundraiser in the Last Year:    Arboriculturist in the Last Year:   Transportation Needs:    Film/video editor (Medical):    Lack of Transportation (Non-Medical):   Physical Activity:  Days of Exercise per Week:    Minutes of Exercise per Session:   Stress:    Feeling of Stress :   Social Connections:    Frequency of Communication with Friends and Family:    Frequency of Social Gatherings with Friends and Family:    Attends Religious Services:    Active Member of Clubs or Organizations:    Attends Music therapist:    Marital Status:   Intimate Partner Violence:    Fear of Current or Ex-Partner:    Emotionally Abused:    Physically Abused:    Sexually Abused:     Family History:  Family History  Problem Relation Age of Onset   Heart disease Mother 12       CHF   Alcohol abuse Father    Stroke Father     Medications:   Current Outpatient Medications on File Prior to Visit  Medication Sig Dispense Refill   amLODipine (NORVASC) 5 MG tablet TAKE 1 TABLET BY MOUTH EVERY DAY 90 tablet 0   aspirin 325 MG tablet Take 1 tablet (325 mg total) by mouth daily. 30 tablet 2   atorvastatin (LIPITOR) 40 MG tablet Take 1 tablet (40 mg total) by mouth daily. 30 tablet 2   Blood Pressure Monitor DEVI Use to check blood pressure 3 times a week 1 Device 0   clopidogrel (PLAVIX) 75 MG tablet Take 1 tablet (75 mg total) by  mouth daily. 30 tablet 2   hydrochlorothiazide (HYDRODIURIL) 25 MG tablet TAKE 1 TABLET BY MOUTH EVERY DAY 90 tablet 0   UNABLE TO FIND HOLISTER UROSTOMY POUCHES ITEM 8478 100 Device 3   No current facility-administered medications on file prior to visit.    Allergies:   Allergies  Allergen Reactions   Imodium [Loperamide]     Itchy all over   Loperamide Hcl Rash      OBJECTIVE:  Physical Exam  Vitals:   05/26/20 1305 05/26/20 1311 05/26/20 1413  BP: (!) 168/90 (!) 172/86 (!) 152/84  Pulse: 85 80   Weight: 155 lb (70.3 kg)    Height: 5\' 8"  (1.727 m)     Body mass index is 23.57 kg/m. No exam data present   General: well developed, well nourished,  pleasant elderly Caucasian female, seated, in no evident distress Head: head normocephalic and atraumatic.   Neck: supple with no carotid or supraclavicular bruits Cardiovascular: regular rate and rhythm, no murmurs Musculoskeletal: no deformity Skin:  no rash/petichiae Vascular:  Normal pulses all extremities   Neurologic Exam Mental Status: Awake and fully alert. Fluent speech and language. Oriented to place and time. Recent and remote memory intact. Attention span, concentration and fund of knowledge appropriate. Mood and affect appropriate.  Correct serial additions; 4 legged animal naming in 60 seconds 20; recall 3/3 Cranial Nerves: Fundoscopic exam reveals sharp disc margins. Pupils equal, briskly reactive to light. Extraocular movements full without nystagmus. Visual fields left superior homonymous quadrantanopia. Hearing intact. Facial sensation intact.  Mild left facial weakness Motor: Normal bulk and tone. Normal strength in all tested extremity muscles except mild left hip flexor weakness Sensory.: intact to touch , pinprick , position and vibratory sensation.  Coordination: Rapid alternating movements normal in all extremities. Finger-to-nose and heel-to-shin performed accurately bilaterally. Gait and Station:  Arises from chair without difficulty. Stance is normal. Gait demonstrates normal stride length and imbalance with slight lean towards right. Unable to perform tandem walk.  Romberg positive. Reflexes: 1+ and symmetric. Toes downgoing.  NIHSS  2 Modified Rankin  2      ASSESSMENT: Sara Aguirre is a 79 y.o. year old female presented with dizziness and left facial and arm numbness on 04/21/2020 with stroke work-up revealing right PCA (mesial right temporal lobe/hippocampus, medial right temporal lobe, right occipital lobe and callosal splenium) and right thalamic infarcts with right PCA occlusion, embolic secondary to unknown source therefore loop recorder placed to evaluate for atrial fibrillation as possible etiology. Vascular risk factors include HTN, HLD, former tobacco use and advanced age.  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 Right PCA and thalamic stroke:  -Residual deficits: Recommend participation outpatient therapy but she declines interest at this time.  She will call office if interested in pursuing.  Recommend evaluation by established ophthalmologist to ensure safety with return to driving in regards to visual loss.  Discussion with patient and son regarding gradual return to driving but if there are any concerns with her returning to driving, would recommend formal driving rehabilitation program -continue aspirin 325 mg daily and clopidogrel 75 mg daily  and atorvastatin for secondary stroke prevention.  Continue DAPT for additional 2 months and then Plavix alone.   -Loop recorder will continue to be monitored for possible atrial fibrillation.   -Maintain strict control of hypertension with blood pressure goal below 130/90, diabetes with hemoglobin A1c goal below 6.5% and cholesterol with LDL cholesterol (bad cholesterol) goal below 70 mg/dL.  I also  advised the patient to eat a healthy diet with plenty of whole grains, cereals, fruits and vegetables, exercise regularly with at least 30 minutes of continuous activity daily and maintain ideal body weight. 2. HTN: Elevated during today's visit.  Reports stable levels at home.  Advised to continue to monitor due to follow with PCP for monitoring and management 3. HLD: Continuation of atorvastatin 40 mg daily and continue to follow PCP for prescribing, monitoring 4. She was provided additional information regarding Arcadia trial by [facility] research team for further consideration with participation.  She will call office if she wishes to proceed     Follow up in 3 months or call earlier if needed   I spent 45 minutes of face-to-face and non-face-to-face time with patient and son.  This included previsit chart review, lab review, study review, order entry, electronic health record documentation, patient education regarding recent stroke, residual deficits, importance of managing stroke risk factors and answered all questions to patient satisfaction     Frann Rider, [facility]  [facility] [address] [place], [place] [address]  Phone [phone] Fax [phone] Note: This document was prepared with digital dictation and possible smart phrase technology. Any transcriptional errors that result from this process are unintentional.

## 2020-05-26 NOTE — Patient Instructions (Signed)
You have been recovering well from a stroke standpoint but continued to have mild balance difficulties and visual impairment.  Please call office if you are interested in participating in physical therapy for further improvement.  Please schedule follow-up evaluation with your eye doctor prior to returning to driving for further clearance.  If there are any concerns regarding return to driving as far as cognition if you are cleared by your eye doctor, it would be recommended for you to pursue driving rehabilitation  Continue aspirin 325 mg daily and clopidogrel 75 mg daily  and atorvastatin 40 mg daily for secondary stroke prevention  Continue aspirin for additional 2 months and then discontinue  Continue to follow up with PCP regarding cholesterol and blood pressure management   Loop recorder will continue to be monitored for possible atrial fibrillation  Continue to monitor blood pressure at home  You were provided information today regarding Arcadia trial.  Please consider and call office if interested  Maintain strict control of hypertension with blood pressure goal below 130/90, diabetes with hemoglobin A1c goal below 6.5% and cholesterol with LDL cholesterol (bad cholesterol) goal below 70 mg/dL. I also advised the patient to eat a healthy diet with plenty of whole grains, cereals, fruits and vegetables, exercise regularly and maintain ideal body weight.  Followup in the future with me in 3 months or call earlier if needed       Thank you for coming to see [place] at [facility]. I hope we have been able to provide you high quality care today.  You may receive a patient satisfaction survey over the next few weeks. We would appreciate your feedback and comments so that we may continue to improve ourselves and the health of our patients.

## 2020-05-27 ENCOUNTER — Ambulatory Visit (INDEPENDENT_AMBULATORY_CARE_PROVIDER_SITE_OTHER): Payer: PPO | Admitting: Family Medicine

## 2020-05-27 ENCOUNTER — Other Ambulatory Visit: Payer: Self-pay

## 2020-05-27 ENCOUNTER — Encounter: Payer: Self-pay | Admitting: Family Medicine

## 2020-05-27 VITALS — BP 154/84 | HR 92 | Temp 97.5°F | Ht 68.0 in | Wt 153.0 lb

## 2020-05-27 DIAGNOSIS — R7303 Prediabetes: Secondary | ICD-10-CM | POA: Diagnosis not present

## 2020-05-27 DIAGNOSIS — E782 Mixed hyperlipidemia: Secondary | ICD-10-CM | POA: Diagnosis not present

## 2020-05-27 DIAGNOSIS — I6930 Unspecified sequelae of cerebral infarction: Secondary | ICD-10-CM | POA: Diagnosis not present

## 2020-05-27 DIAGNOSIS — I10 Essential (primary) hypertension: Secondary | ICD-10-CM | POA: Diagnosis not present

## 2020-05-27 DIAGNOSIS — I639 Cerebral infarction, unspecified: Secondary | ICD-10-CM

## 2020-05-27 MED ORDER — AMLODIPINE BESYLATE 2.5 MG PO TABS: 2.5000 mg | ORAL_TABLET | Freq: Every day | ORAL | 1 refills | Status: DC

## 2020-05-27 NOTE — Progress Notes (Signed)
Subjective:  Patient ID: Sara Aguirre, female    DOB: December 09, 1941  Age: 79 y.o. MRN: [id number]  CC:  Chief Complaint  Patient presents with  . Establish Care    pt states as far as her general health she feels fine. just a little tiered. pt resently had a stroke on 04/21/2020 and was admited in the hospital. pt had a nero follow-up yesterday. pt's son has some questions about that appt.    HPI Sara Aguirre presents for   Establish care, hospital follow-up.  previous patient of Dr. Nolon Rod.  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  Now with CVD, recent CVA.  Cerebrovascular accident: Discharge summary reviewed.  Admitted May 5 through May 7. Symptoms May 4, left face numbness left arm numbness headache and dizziness.  PCA territory acute infarcts on the right noted on imaging.  Small lacunar infarcts also present in the right thalamus.  Treated with aspirin along with Plavix for 3 months, then Plavix alone.  Lipitor dose was also increased (LDL 112, atorvastatin increased from 20 to 40 mg daily), lower extremity Doppler was performed as well as electrocardiogram and loop recorder was placed prior to discharge.  She was treated for hypertensive emergency at that time with blood pressure 198/114.   Follow-up appointment with neurology yesterday reviewed.  Residual deficits of numbness on the side of her tongue and left fingers as well as mild imbalance and visual impairment.  Some initial cognitive concerns and gait abnormality which have been improving over the previous week.  Progressing slowly to independent level functioning.  Blood pressure 152/84 visit yesterday.  Home readings reportedly 140 over 80s.  Loop recorder without atrial fibrillation to that point.  Physical therapy as option for balance difficulties.  Advised to follow-up with ophthalmologist prior to driving for further clearance.   Driving rehabilitation recommended if concerns return to driving. Has appt pending with optho - [facility].   Blood pressure record brought to visit.  Readings of 148/87, 142/82 today.  158/90, 163/89 yesterday.  142/90 on June 8. No missed doses of meds.  Using 4 point cane as needed.  norvasc 5mg  qd, hctz 25 QD.  Feels like needs to take a deep breath at times, no shortness of breath   BP Readings from Last 3 Encounters:  05/27/20 (!) 154/84  05/26/20 (!) 152/84  04/28/20 (!) 143/85   Lab Results  Component Value Date   CHOL 194 04/22/2020   HDL 49 04/22/2020   LDLCALC 112 (H) 04/22/2020   TRIG 166 (H) 04/22/2020   CHOLHDL 4.0 04/22/2020   Lab Results  Component Value Date   HGBA1C 5.9 (H) 04/22/2020   Plant based diet and whole food diet past 2 months.      History Patient Active Problem List   Diagnosis Date Noted  . Acute CVA (cerebrovascular accident) ([place]) 04/21/2020  . Hypokalemia 04/21/2020  . Pure hypercholesterolemia 01/17/2017  . Thyroid nodule 08/20/2013  . HTN (hypertension) 08/20/2013  . Body mass index (BMI) of 24.0-24.9 in adult 08/20/2013  . Gastric ulcer 12/29/2011  . Renal insufficiency 12/29/2011  . History of bladder cancer, s/p cystectomy with ileal conduit    Past Medical History:  Diagnosis Date  . Cancer [facility])    bladder, s/p cystectomy with ileal conduit  . History of bladder cancer   . History of tobacco use   . Hyperlipidemia   . Hypertension   .  Incarcerated ventral hernia, s/p lap repair 12/05/2011  . Parastomal hernia of ileal conduit, s/p lap repair [id number] 12/04/2011  . Ulcer, gastric, acute or chronic    Past Surgical History:  Procedure Laterality Date  . BLADDER REMOVAL  10/1993   Dr. Risa Grill  . HERNIA REPAIR  12/05/11   parastomal  . LOOP RECORDER INSERTION N/A 04/23/2020   Procedure: LOOP RECORDER INSERTION;  Surgeon: Deboraha Sprang, MD;  Location: [place] [facility];  Service: Cardiovascular;  Laterality: N/A;    . PARASTOMAL HERNIA REPAIR  12/05/2011   Procedure: HERNIA REPAIR PARASTOMAL;  Surgeon: Adin Hector, MD;  Location: [facility];  Service: General;;  Laprascopic lysis of adhestons with reduction and repair with biological mesh for incarcerated parastomal and ventral long incisional hernia.  Marland Kitchen REVISION UROSTOMY CUTANEOUS    . VENTRAL HERNIA REPAIR  12/05/2011   Procedure: HERNIA REPAIR VENTRAL ADULT;  Surgeon: Adin Hector, MD;  Location: [facility];  Service: General;;   Allergies  Allergen Reactions  . Imodium [Loperamide]     Itchy all over  . Loperamide Hcl Rash   Prior to Admission medications   Medication Sig Start Date End Date Taking? Authorizing Provider  amLODipine (NORVASC) 5 MG tablet TAKE 1 TABLET BY MOUTH EVERY DAY 05/07/20  Yes Delia Chimes A, MD  aspirin 325 MG tablet Take 1 tablet (325 mg total) by mouth daily. 04/24/20 07/23/20 Yes Dessa Phi, DO  atorvastatin (LIPITOR) 40 MG tablet Take 1 tablet (40 mg total) by mouth daily. 04/24/20  Yes Dessa Phi, DO  Blood Pressure Monitor DEVI Use to check blood pressure 3 times a week 10/27/19  Yes Stallings, Zoe A, MD  clopidogrel (PLAVIX) 75 MG tablet Take 1 tablet (75 mg total) by mouth daily. 04/24/20  Yes Dessa Phi, DO  cyanocobalamin 1000 MCG tablet Take 1,000 mcg by mouth daily.   Yes [provider]  hydrochlorothiazide (HYDRODIURIL) 25 MG tablet TAKE 1 TABLET BY MOUTH EVERY DAY 05/07/20  Yes Forrest Moron, MD  UNABLE TO [place] 8478 10/31/18  Yes Forrest Moron, MD   Social History   Socioeconomic History  . Marital status: Widowed    Spouse name: Not on file  . Number of children: 2  . Years of education: Not on file  . Highest education level: Some college, no degree  Occupational History  . Occupation: retired  Tobacco Use  . Smoking status: Former Smoker    Quit date: 01/02/1993    Years since quitting: 27.4  . Smokeless tobacco: Never Used  Vaping Use  . Vaping  Use: Never used  Substance and Sexual Activity  . Alcohol use: No  . Drug use: No  . Sexual activity: Not on file  Other Topics Concern  . 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  Gardening      ADLs: drives; independent with ADLs; has garden      Advanced Directives: YES: full code but no prolonged measures; HCPOA: Orvis Brill  or other son Cory Munch; copy on chart 04/2017.   Social Determinants of Health   Financial Resource Strain:   . Difficulty of Paying Living Expenses:   Food Insecurity:   . Worried About Charity fundraiser in the Last Year:   . Arboriculturist in the Last Year:   Transportation Needs:   . Film/video editor (Medical):   Marland Kitchen Lack of Transportation (Non-Medical):   Physical Activity:   . Days of Exercise per Week:   . Minutes of Exercise per Session:   Stress:   . Feeling of Stress :   Social Connections:   . Frequency of Communication with Friends and Family:   . Frequency of Social Gatherings with Friends and Family:   . Attends Religious Services:   . Active Member of Clubs or Organizations:   . Attends Archivist Meetings:   Marland Kitchen Marital Status:   Intimate Partner Violence:   . Fear of Current or Ex-Partner:   . Emotionally Abused:   Marland Kitchen Physically Abused:   . Sexually Abused:     Review of Systems  Constitutional: Negative for fatigue and unexpected weight change.  Eyes: Negative for visual disturbance (no new since CVA deficit. ).  Respiratory: Negative for chest tightness and shortness of breath.   Cardiovascular: Negative for chest pain, palpitations and leg swelling.    Gastrointestinal: Negative for abdominal pain and blood in stool.  Neurological: Negative for dizziness, syncope, light-headedness and headaches.    Objective:   Vitals:   05/27/20 1412 05/27/20 1417  BP: (!) 181/90 (!) 154/84  Pulse: 92   Temp: (!) 97.5 F (36.4 C)   TempSrc: Temporal   SpO2: 96%   Weight: 153 lb (69.4 kg)   Height: 5\' 8"  (1.727 m)      Physical Exam Vitals reviewed.  Constitutional:      Appearance: She is well-developed.  HENT:     Head: Normocephalic and atraumatic.  Eyes:     Conjunctiva/sclera: Conjunctivae normal.     Pupils: Pupils are equal, round, and reactive to light.  Neck:     Vascular: No carotid bruit.  Cardiovascular:     Rate and Rhythm: Normal rate and regular rhythm.     Heart sounds: Normal heart sounds.  Pulmonary:     Effort: Pulmonary effort is normal.     Breath sounds: Normal breath sounds.  Abdominal:     Palpations: Abdomen is soft. There is no pulsatile mass.     Tenderness: There is no abdominal tenderness.  Musculoskeletal:     Right lower leg: No edema.     Left lower leg: No edema.  Skin:    General: Skin is warm and dry.  Neurological:     Mental Status: She is alert and oriented to person, place, and time.  Psychiatric:        Mood and Affect: Mood normal.        Behavior: Behavior normal.        Assessment & Plan:  Sara Aguirre is a 79 y.o. female . Cerebrovascular accident (CVA), unspecified mechanism ([place])  Uncontrolled hypertension - Plan: amLODipine (NORVASC) 2.5 MG tablet  Mixed hyperlipidemia  Prediabetes  Stable/improving since CVA.    - Blood pressure still running high based on home readings.  Add 2.5 mg amlodipine.  Orthostatic/hypotensive/pedal edema precautions discussed.  Recheck 1 month.  -Tolerating higher dose of statin, plan on LFTs, lipid panel 1 month.  -Elevated A1c/prediabetes discussed.  Has been adhering to good diet by her report.  Suspect genetic component.  Would have  a low threshold to start low-dose Metformin with her history of recent CVA, but hold on meds at this time.  -Plans to discuss physical therapy options with neurology.  Has follow-up with ophthalmology planned to evaluate for driving safety.  Meds ordered this encounter  Medications  . amLODipine (NORVASC) 2.5 MG tablet    Sig: Take 1 tablet (2.5 mg total) by mouth daily.    Dispense:  90 tablet    Refill:  1   Patient Instructions    Continue current meds. Add additional amlodipine once per day for better  See info on prediabetes below. We will check this again in 2-3 months.  Continue higher dose of lipitor, can recheck levels next month.  Return to the clinic or go to the nearest emergency room if any of your symptoms worsen or new symptoms occur.  Nice meeting you today.    Prediabetes Prediabetes is the condition of having a blood sugar (blood glucose) level that is higher than it should be, but not high enough for you to be diagnosed with type 2 diabetes. Having prediabetes puts you at risk for developing type 2 diabetes (type 2 diabetes mellitus). Prediabetes may be called impaired glucose tolerance or impaired fasting glucose. Prediabetes usually does not cause symptoms. Your health care provider can diagnose this condition with blood tests. You may be tested for prediabetes if you are overweight and if you have at least one other risk factor for prediabetes. What is blood glucose, and how is it measured? Blood glucose refers to the amount of glucose in your bloodstream. Glucose comes from eating foods that contain sugars and starches (carbohydrates), which the body breaks down into glucose. Your blood glucose level may be measured in mg/dL (milligrams per deciliter) or mmol/L (millimoles per liter). Your blood glucose may be checked with one or more of the following blood tests:  A fasting blood glucose (FBG) test. You will not be allowed to eat (you will fast) for 8 hours or  longer before a blood sample is taken. ? A normal range for FBG is 70-100 mg/dl (3.9-5.6 mmol/L).  An A1c (hemoglobin A1c) blood test. This test provides information about blood glucose control over the previous 2?53months.  An oral glucose tolerance test (OGTT). This test measures your blood glucose at two times: ? After fasting. This is your baseline level. ? Two hours after you drink a beverage that contains glucose. You may be diagnosed with prediabetes:  If your FBG is 100?125 mg/dL (5.6-6.9 mmol/L).  If your A1c level is 5.7?6.4%.  If your OGTT result is 140?199 mg/dL (7.8-11 mmol/L). These blood tests may be repeated to confirm your diagnosis. How can this condition affect me? The pancreas produces a hormone (insulin) that helps to move glucose from the bloodstream into cells. When cells in the body do not respond properly to insulin that the body makes (insulin resistance), excess glucose builds up in the blood instead of going into cells. As a result, high blood glucose (hyperglycemia) can develop, which can cause many complications. Hyperglycemia is a symptom of prediabetes. Having high blood glucose for a long time is dangerous. Too much glucose in your blood can damage your nerves and blood vessels. Long-term damage can lead to complications from diabetes, which may include:  Heart disease.  Stroke.  Blindness.  Kidney disease.  Depression.  Poor circulation in the feet and legs, which  could lead to surgical removal (amputation) in severe cases. What can increase my risk? Risk factors for prediabetes include:  Having a family member with type 2 diabetes.  Being overweight or obese.  Being older than age 74.  Being of American [place], African-American, Hispanic/Latino, or Asian/Pacific Islander descent.  Having an inactive (sedentary) lifestyle.  Having a history of heart disease.  History of gestational diabetes or polycystic ovary syndrome (PCOS), in  women.  Having low levels of good cholesterol (HDL-C) or high levels of blood fats (triglycerides).  Having high blood pressure. What actions can I take to prevent diabetes?      Be physically active. ? Do moderate-intensity physical activity for 30 or more minutes on 5 or more days of the week, or as much as told by your health care provider. This could be brisk walking, biking, or water aerobics. ? Ask your health care provider what activities are safe for you. A mix of physical activities may be best, such as walking, swimming, cycling, and strength training.  Lose weight as told by your health care provider. ? Losing 5-7% of your body weight can reverse insulin resistance. ? Your health care provider can determine how much weight loss is best for you and can help you lose weight safely.  Follow a healthy meal plan. This includes eating lean proteins, complex carbohydrates, fresh fruits and vegetables, low-fat dairy products, and healthy fats. ? Follow instructions from your health care provider about eating or drinking restrictions. ? Make an appointment to see a diet and nutrition specialist (registered dietitian) to help you create a healthy eating plan that is right for you.  Do not smoke or use any tobacco products, such as cigarettes, chewing tobacco, and e-cigarettes. If you need help quitting, ask your health care provider.  Take over-the-counter and prescription medicines as told by your health care provider. You may be prescribed medicines that help lower the risk of type 2 diabetes.  Keep all follow-up visits as told by your health care provider. This is important. Summary  Prediabetes is the condition of having a blood sugar (blood glucose) level that is higher than it should be, but not high enough for you to be diagnosed with type 2 diabetes.  Having prediabetes puts you at risk for developing type 2 diabetes (type 2 diabetes mellitus).  To help prevent type 2  diabetes, make lifestyle changes such as being physically active and eating a healthy diet. Lose weight as told by your health care provider. This information is not intended to replace advice given to you by your health care provider. Make sure you discuss any questions you have with your health care provider. Document Revised: 03/28/2019 Document Reviewed: 01/25/2016 Elsevier Patient Education  [employer].   If you have lab work done today you will be contacted with your lab results within the next 2 weeks.  If you have not heard from [place] then please contact us. The fastest way to get your results is to register for My Chart.   IF you received an x-ray today, you will receive an invoice from [facility]. Please contact [facility] at [phone] with questions or concerns regarding your invoice.   IF you received labwork today, you will receive an invoice from [place]. Please contact [facility] at [phone] with questions or concerns regarding your invoice.   Our billing staff will not be able to assist you with questions regarding bills from these companies.  You will be contacted with the  lab results as soon as they are available. The fastest way to get your results is to activate your My Chart account. Instructions are located on the last page of this paperwork. If you have not heard from [place] regarding the results in 2 weeks, please contact this office.         Signed, Merri Ray, MD [facility]

## 2020-05-27 NOTE — Patient Instructions (Addendum)
Continue current meds. Add additional amlodipine once per day for better  See info on prediabetes below. We will check this again in 2-3 months.  Continue higher dose of lipitor, can recheck levels next month.  Return to the clinic or go to the nearest emergency room if any of your symptoms worsen or new symptoms occur.  Nice meeting you today.    Prediabetes Prediabetes is the condition of having a blood sugar (blood glucose) level that is higher than it should be, but not high enough for you to be diagnosed with type 2 diabetes. Having prediabetes puts you at risk for developing type 2 diabetes (type 2 diabetes mellitus). Prediabetes may be called impaired glucose tolerance or impaired fasting glucose. Prediabetes usually does not cause symptoms. Your health care provider can diagnose this condition with blood tests. You may be tested for prediabetes if you are overweight and if you have at least one other risk factor for prediabetes. What is blood glucose, and how is it measured? Blood glucose refers to the amount of glucose in your bloodstream. Glucose comes from eating foods that contain sugars and starches (carbohydrates), which the body breaks down into glucose. Your blood glucose level may be measured in mg/dL (milligrams per deciliter) or mmol/L (millimoles per liter). Your blood glucose may be checked with one or more of the following blood tests:  A fasting blood glucose (FBG) test. You will not be allowed to eat (you will fast) for 8 hours or longer before a blood sample is taken. ? A normal range for FBG is 70-100 mg/dl (3.9-5.6 mmol/L).  An A1c (hemoglobin A1c) blood test. This test provides information about blood glucose control over the previous 2?69months.  An oral glucose tolerance test (OGTT). This test measures your blood glucose at two times: ? After fasting. This is your baseline level. ? Two hours after you drink a beverage that contains glucose. You may be diagnosed  with prediabetes:  If your FBG is 100?125 mg/dL (5.6-6.9 mmol/L).  If your A1c level is 5.7?6.4%.  If your OGTT result is 140?199 mg/dL (7.8-11 mmol/L). These blood tests may be repeated to confirm your diagnosis. How can this condition affect me? The pancreas produces a hormone (insulin) that helps to move glucose from the bloodstream into cells. When cells in the body do not respond properly to insulin that the body makes (insulin resistance), excess glucose builds up in the blood instead of going into cells. As a result, high blood glucose (hyperglycemia) can develop, which can cause many complications. Hyperglycemia is a symptom of prediabetes. Having high blood glucose for a long time is dangerous. Too much glucose in your blood can damage your nerves and blood vessels. Long-term damage can lead to complications from diabetes, which may include:  Heart disease.  Stroke.  Blindness.  Kidney disease.  Depression.  Poor circulation in the feet and legs, which could lead to surgical removal (amputation) in severe cases. What can increase my risk? Risk factors for prediabetes include:  Having a family member with type 2 diabetes.  Being overweight or obese.  Being older than age 33.  Being of American [place], African-American, Hispanic/Latino, or Asian/Pacific Islander descent.  Having an inactive (sedentary) lifestyle.  Having a history of heart disease.  History of gestational diabetes or polycystic ovary syndrome (PCOS), in women.  Having low levels of good cholesterol (HDL-C) or high levels of blood fats (triglycerides).  Having high blood pressure. What actions can I take to prevent diabetes?  Be physically active. ? Do moderate-intensity physical activity for 30 or more minutes on 5 or more days of the week, or as much as told by your health care provider. This could be brisk walking, biking, or water aerobics. ? Ask your health care provider what  activities are safe for you. A mix of physical activities may be best, such as walking, swimming, cycling, and strength training.  Lose weight as told by your health care provider. ? Losing 5-7% of your body weight can reverse insulin resistance. ? Your health care provider can determine how much weight loss is best for you and can help you lose weight safely.  Follow a healthy meal plan. This includes eating lean proteins, complex carbohydrates, fresh fruits and vegetables, low-fat dairy products, and healthy fats. ? Follow instructions from your health care provider about eating or drinking restrictions. ? Make an appointment to see a diet and nutrition specialist (registered dietitian) to help you create a healthy eating plan that is right for you.  Do not smoke or use any tobacco products, such as cigarettes, chewing tobacco, and e-cigarettes. If you need help quitting, ask your health care provider.  Take over-the-counter and prescription medicines as told by your health care provider. You may be prescribed medicines that help lower the risk of type 2 diabetes.  Keep all follow-up visits as told by your health care provider. This is important. Summary  Prediabetes is the condition of having a blood sugar (blood glucose) level that is higher than it should be, but not high enough for you to be diagnosed with type 2 diabetes.  Having prediabetes puts you at risk for developing type 2 diabetes (type 2 diabetes mellitus).  To help prevent type 2 diabetes, make lifestyle changes such as being physically active and eating a healthy diet. Lose weight as told by your health care provider. This information is not intended to replace advice given to you by your health care provider. Make sure you discuss any questions you have with your health care provider. Document Revised: 03/28/2019 Document Reviewed: 01/25/2016 Elsevier Patient Education  [employer].   If you have lab work done  today you will be contacted with your lab results within the next 2 weeks.  If you have not heard from [place] then please contact us. The fastest way to get your results is to register for My Chart.   IF you received an x-ray today, you will receive an invoice from [facility]. Please contact [facility] at [phone] with questions or concerns regarding your invoice.   IF you received labwork today, you will receive an invoice from [place]. Please contact [facility] at [phone] with questions or concerns regarding your invoice.   Our billing staff will not be able to assist you with questions regarding bills from these companies.  You will be contacted with the lab results as soon as they are available. The fastest way to get your results is to activate your My Chart account. Instructions are located on the last page of this paperwork. If you have not heard from [place] regarding the results in 2 weeks, please contact this office.

## 2020-05-31 NOTE — Progress Notes (Signed)
I agree with the above plan 

## 2020-06-14 DIAGNOSIS — H5340 Unspecified visual field defects: Secondary | ICD-10-CM | POA: Diagnosis not present

## 2020-06-25 ENCOUNTER — Encounter: Payer: Self-pay | Admitting: Family Medicine

## 2020-06-25 ENCOUNTER — Ambulatory Visit (INDEPENDENT_AMBULATORY_CARE_PROVIDER_SITE_OTHER): Payer: PPO | Admitting: Family Medicine

## 2020-06-25 ENCOUNTER — Other Ambulatory Visit: Payer: Self-pay

## 2020-06-25 VITALS — BP 160/88 | HR 100 | Temp 98.1°F | Ht 68.0 in | Wt 157.0 lb

## 2020-06-25 DIAGNOSIS — I6930 Unspecified sequelae of cerebral infarction: Secondary | ICD-10-CM

## 2020-06-25 DIAGNOSIS — I10 Essential (primary) hypertension: Secondary | ICD-10-CM

## 2020-06-25 DIAGNOSIS — E782 Mixed hyperlipidemia: Secondary | ICD-10-CM

## 2020-06-25 DIAGNOSIS — N289 Disorder of kidney and ureter, unspecified: Secondary | ICD-10-CM

## 2020-06-25 DIAGNOSIS — I639 Cerebral infarction, unspecified: Secondary | ICD-10-CM

## 2020-06-25 MED ORDER — LISINOPRIL 10 MG PO TABS: 10.0000 mg | ORAL_TABLET | Freq: Every day | ORAL | 1 refills | Status: DC

## 2020-06-25 NOTE — Patient Instructions (Addendum)
  Return to 5mg  of amlodipine to see if that causes less ankle swelling.  Add lisinopril once per day.  Continue hydrochlorothiazide.   Recheck in 2 weeks- fasting at that time for other bloodwork.  Keep a record of your blood pressures outside of the office and bring them to the next office visit. Make sure to drink plenty of water.   Return to the clinic or go to the nearest emergency room if any of your symptoms worsen or new symptoms occur.    If you have lab work done today you will be contacted with your lab results within the next 2 weeks.  If you have not heard from [place] then please contact us. The fastest way to get your results is to register for My Chart.   IF you received an x-ray today, you will receive an invoice from [facility]. Please contact [facility] at [phone] with questions or concerns regarding your invoice.   IF you received labwork today, you will receive an invoice from [place]. Please contact [facility] at [phone] with questions or concerns regarding your invoice.   Our billing staff will not be able to assist you with questions regarding bills from these companies.  You will be contacted with the lab results as soon as they are available. The fastest way to get your results is to activate your My Chart account. Instructions are located on the last page of this paperwork. If you have not heard from [place] regarding the results in 2 weeks, please contact this office.

## 2020-06-25 NOTE — Progress Notes (Signed)
Subjective:  Patient ID: Sara Aguirre, female    DOB: Nov 24, 1941  Age: 79 y.o. MRN: [id number]  CC:  Chief Complaint  Patient presents with  . Follow-up    on cerebrovascular accident. Pt reports that a few days after her last OV with [place] the pressue in her chest went away.. pt reports keeping tracking of her BP pt states she checks it everyday sometime 2x a day and she brought a record of her at home BP's with her today.  . Edema    Pt has some swelling in her L ankel no pain . pt states the ankel isn't swollen everyday just sometimes.    HPI Sara Aguirre presents for   Follow-up from June 10 visit.  History of CVA Hypertension with renal insufficiency. Discussed last visit, admitted May 5 through May 7 Residual deficits of numbness on left side of tongue and left angers as well as mild imbalance and visual impairment.  She was progressively improving with her gait and cognitive concerns.  Closer to independent level functioning.  Did have elevated blood pressure June 9 and June 10 in the 638 systolic.  Loop recorder without apparent atrial fibrillation.  Plan for ophthalmology follow-up to determine driving capability. Home blood pressure readings 140s to 160s at last visit.  Was continued on hydrochlorothiazide 25 mg daily.,  Increased amlodipine from 5 mg to 7.5 mg daily. Higher dose Lipitor started in the hospital, plan for repeat testing today.  Home readings variable 130's -150's. Single reading of 163/98 - repeated 146/81.  No missed doses of meds. No known new side effects, but some episodic left ankle swelling - similar in past.  Possibly more since last visit.  No new calf pain or swelling. No chest pain or dyspnea. No further chest symptoms.   GFR in 30 range since 2019. Creat range 1.30-1.72.  BP Readings from Last 3 Encounters:  06/25/20 (!) 160/88  05/27/20 (!) 154/84  05/26/20 (!) 152/84   Lab Results  Component Value Date   CREATININE 1.35 (H) 04/22/2020    Lab Results  Component Value Date   CHOL 194 04/22/2020   HDL 49 04/22/2020   LDLCALC 112 (H) 04/22/2020   TRIG 166 (H) 04/22/2020   CHOLHDL 4.0 04/22/2020    History Patient Active Problem List   Diagnosis Date Noted  . Acute CVA (cerebrovascular accident) ([place]) 04/21/2020  . Hypokalemia 04/21/2020  . Pure hypercholesterolemia 01/17/2017  . Thyroid nodule 08/20/2013  . HTN (hypertension) 08/20/2013  . Body mass index (BMI) of 24.0-24.9 in adult 08/20/2013  . Gastric ulcer 12/29/2011  . Renal insufficiency 12/29/2011  . History of bladder cancer, s/p cystectomy with ileal conduit    Past Medical History:  Diagnosis Date  . Cancer [facility])    bladder, s/p cystectomy with ileal conduit  . History of bladder cancer   . History of tobacco use   . Hyperlipidemia   . Hypertension   . Incarcerated ventral hernia, s/p lap repair 12/05/2011  . Parastomal hernia of ileal conduit, s/p lap repair [id number] 12/04/2011  . Ulcer, gastric, acute or chronic    Past Surgical History:  Procedure Laterality Date  . BLADDER REMOVAL  10/1993   Dr. Risa Grill  . HERNIA REPAIR  12/05/11   parastomal  . LOOP RECORDER INSERTION N/A 04/23/2020   Procedure: LOOP RECORDER INSERTION;  Surgeon: Deboraha Sprang, MD;  Location: [facility];  Service: Cardiovascular;  Laterality: N/A;  . PARASTOMAL HERNIA REPAIR  12/05/2011   Procedure: HERNIA REPAIR PARASTOMAL;  Surgeon: Adin Hector, MD;  Location: [facility];  Service: General;;  Laprascopic lysis of adhestons with reduction and repair with biological mesh for incarcerated parastomal and ventral long incisional hernia.  Marland Kitchen REVISION UROSTOMY CUTANEOUS    . VENTRAL HERNIA REPAIR  12/05/2011   Procedure: HERNIA REPAIR VENTRAL ADULT;  Surgeon: Adin Hector, MD;  Location: [facility];  Service: General;;   Allergies  Allergen Reactions  . Imodium [Loperamide]     Itchy all over  . Loperamide Hcl Rash   Prior to Admission medications   Medication Sig  Start Date End Date Taking? Authorizing Provider  amLODipine (NORVASC) 2.5 MG tablet Take 1 tablet (2.5 mg total) by mouth daily. 05/27/20  Yes Wendie Agreste, MD  amLODipine (NORVASC) 5 MG tablet TAKE 1 TABLET BY MOUTH EVERY DAY 05/07/20  Yes Delia Chimes A, MD  atorvastatin (LIPITOR) 40 MG tablet Take 1 tablet (40 mg total) by mouth daily. 04/24/20  Yes Dessa Phi, DO  Blood Pressure Monitor DEVI Use to check blood pressure 3 times a week 10/27/19  Yes Stallings, Zoe A, MD  clopidogrel (PLAVIX) 75 MG tablet Take 1 tablet (75 mg total) by mouth daily. 04/24/20  Yes Dessa Phi, DO  cyanocobalamin 1000 MCG tablet Take 1,000 mcg by mouth daily.   Yes [provider]  hydrochlorothiazide (HYDRODIURIL) 25 MG tablet TAKE 1 TABLET BY MOUTH EVERY DAY 05/07/20  Yes Forrest Moron, MD  UNABLE TO FIND HOLISTER UROSTOMY POUCHES ITEM [id number] 10/31/18  Yes Delia Chimes A, MD  aspirin 325 MG tablet Take 1 tablet (325 mg total) by mouth daily. Patient not taking: Reported on 06/25/2020 04/24/20 07/23/20  Dessa Phi, DO   Social History   Socioeconomic History  . Marital status: Widowed    Spouse name: Not on file  . Number of children: 2  . Years of education: Not on file  . Highest education level: Some college, no degree  Occupational History  . Occupation: retired  Tobacco Use  . Smoking status: Former Smoker    Quit date: 01/02/1993    Years since quitting: 27.4  . Smokeless tobacco: Never Used  Vaping Use  . Vaping Use: Never used  Substance and Sexual Activity  . Alcohol use: No  . Drug use: No  . Sexual activity: Not on file  Other Topics Concern  . 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  Gardening      ADLs: drives; independent with ADLs; has garden      Advanced Directives: YES: full code but no prolonged measures; HCPOA: Orvis Brill or other son Cory Munch; copy on chart 04/2017.   Social Determinants of Health   Financial Resource Strain:   . Difficulty of Paying Living Expenses:   Food Insecurity:   . Worried About Charity fundraiser in the Last Year:   . [place] in the Last Year:   Transportation Needs:   . Lack of  Transportation (Medical):   Marland Kitchen Lack of Transportation (Non-Medical):   Physical Activity:   . Days of Exercise per Week:   . Minutes of Exercise per Session:   Stress:   . Feeling of Stress :   Social Connections:   . Frequency of Communication with Friends and Family:   . Frequency of Social Gatherings with Friends and Family:   . Attends Religious Services:   . Active Member of Clubs or Organizations:   . Attends Archivist Meetings:   Marland Kitchen Marital Status:   Intimate Partner Violence:   . Fear of Current or Ex-Partner:   . Emotionally Abused:   Marland Kitchen Physically Abused:   . Sexually Abused:     Review of Systems  Constitutional: Negative for fatigue and unexpected weight change.  Respiratory: Negative for chest tightness and shortness of breath.   Cardiovascular: Negative for chest pain, palpitations and leg swelling.  Gastrointestinal: Negative for abdominal pain and blood in stool.  Neurological: Negative for dizziness, syncope, weakness (no new focal weakness. ) and light-headedness.     Objective:   Vitals:   06/25/20 1300 06/25/20 1307  BP: (!) 172/92 (!) 160/88  Pulse: 100   Temp: 98.1 F (36.7 C)   TempSrc: Temporal   SpO2: 95%   Weight: 157 lb (71.2 kg)   Height: 5\' 8"  (1.727 m)      Physical Exam Vitals reviewed.  Constitutional:      Appearance: She is well-developed.  HENT:     Head: Normocephalic and atraumatic.  Eyes:      Conjunctiva/sclera: Conjunctivae normal.     Pupils: Pupils are equal, round, and reactive to light.  Neck:     Vascular: No carotid bruit.  Cardiovascular:     Rate and Rhythm: Normal rate and regular rhythm.     Heart sounds: Normal heart sounds.  Pulmonary:     Effort: Pulmonary effort is normal.     Breath sounds: Normal breath sounds.  Abdominal:     Palpations: Abdomen is soft. There is no pulsatile mass.     Tenderness: There is no abdominal tenderness.  Musculoskeletal:     Right lower leg: Edema present.     Left lower leg: Edema (Left greater than right edema at ankles only, neurovascular intact distally, calves nontender without appreciable swelling.  Negative Homans.) present.  Skin:    General: Skin is warm and dry.  Neurological:     Mental Status: She is alert and oriented to person, place, and time.  Psychiatric:        Behavior: Behavior normal.        Assessment & Plan:  Sara Aguirre is a 79 y.o. female . Cerebrovascular accident (CVA), unspecified mechanism ([place]) Uncontrolled hypertension - Plan: lisinopril (ZESTRIL) 10 MG tablet, Basic metabolic panel Renal insufficiency  -Still some elevated readings.  Unfortunately some increased pedal edema with higher dose amlodipine.  Return to 5 mg, add 10 mg lisinopril, continue HCTZ same dose, recheck 2 weeks with repeat labs, baseline creatinine now with history of CKD.  Mixed hyperlipidemia  -Nonfasting today, tolerating higher dose statin, plan on lipids next visit with fasting labs.  Meds ordered this encounter  Medications  . lisinopril (ZESTRIL) 10 MG tablet    Sig: Take 1 tablet (10 mg total) by mouth daily.    Dispense:  30 tablet    Refill:  1   Patient Instructions    Return to 5mg  of amlodipine to see if that causes  less ankle swelling.  Add lisinopril once per day.  Continue hydrochlorothiazide.   Recheck in 2 weeks- fasting at that time for other bloodwork.  Keep a record of your blood  pressures outside of the office and bring them to the next office visit. Make sure to drink plenty of water.   Return to the clinic or go to the nearest emergency room if any of your symptoms worsen or new symptoms occur.    If you have lab work done today you will be contacted with your lab results within the next 2 weeks.  If you have not heard from [place] then please contact us. The fastest way to get your results is to register for My Chart.   IF you received an x-ray today, you will receive an invoice from [facility]. Please contact [facility] at [phone] with questions or concerns regarding your invoice.   IF you received labwork today, you will receive an invoice from [place]. Please contact [facility] at [phone] with questions or concerns regarding your invoice.   Our billing staff will not be able to assist you with questions regarding bills from these companies.  You will be contacted with the lab results as soon as they are available. The fastest way to get your results is to activate your My Chart account. Instructions are located on the last page of this paperwork. If you have not heard from [place] regarding the results in 2 weeks, please contact this office.         Signed, Merri Ray, MD [facility]

## 2020-06-26 LAB — BASIC METABOLIC PANEL
BUN/Creatinine Ratio: 12 (ref 12–28)
BUN: 17 mg/dL (ref 8–27)
CO2: 31 mmol/L — ABNORMAL HIGH (ref 20–29)
Calcium: 9.8 mg/dL (ref 8.7–10.3)
Chloride: 99 mmol/L (ref 96–106)
Creatinine, Ser: 1.37 mg/dL — ABNORMAL HIGH (ref 0.57–1.00)
GFR calc Af Amer: 42 mL/min/{1.73_m2} — ABNORMAL LOW (ref >59)
GFR calc non Af Amer: 36 mL/min/{1.73_m2} — ABNORMAL LOW (ref >59)
Glucose: 96 mg/dL (ref 65–99)
Potassium: 4.5 mmol/L (ref 3.5–5.2)
Sodium: 143 mmol/L (ref 134–144)

## 2020-06-28 ENCOUNTER — Ambulatory Visit (INDEPENDENT_AMBULATORY_CARE_PROVIDER_SITE_OTHER): Payer: PPO | Admitting: *Deleted

## 2020-06-28 DIAGNOSIS — I639 Cerebral infarction, unspecified: Secondary | ICD-10-CM | POA: Diagnosis not present

## 2020-06-28 LAB — CUP PACEART REMOTE DEVICE CHECK
Date Time Interrogation Session: 20210711230344
Implantable Pulse Generator Implant Date: 20210507

## 2020-06-29 NOTE — Progress Notes (Signed)
[facility] Summary Report / Loop Recorder 

## 2020-07-12 ENCOUNTER — Encounter: Payer: Self-pay | Admitting: Family Medicine

## 2020-07-12 ENCOUNTER — Other Ambulatory Visit: Payer: Self-pay

## 2020-07-12 ENCOUNTER — Ambulatory Visit (INDEPENDENT_AMBULATORY_CARE_PROVIDER_SITE_OTHER): Payer: PPO | Admitting: Family Medicine

## 2020-07-12 VITALS — BP 152/74 | HR 71 | Temp 98.0°F | Resp 14 | Ht 68.0 in | Wt 155.4 lb

## 2020-07-12 DIAGNOSIS — I10 Essential (primary) hypertension: Secondary | ICD-10-CM | POA: Diagnosis not present

## 2020-07-12 DIAGNOSIS — E782 Mixed hyperlipidemia: Secondary | ICD-10-CM | POA: Diagnosis not present

## 2020-07-12 DIAGNOSIS — R432 Parageusia: Secondary | ICD-10-CM | POA: Diagnosis not present

## 2020-07-12 DIAGNOSIS — I6930 Unspecified sequelae of cerebral infarction: Secondary | ICD-10-CM | POA: Diagnosis not present

## 2020-07-12 DIAGNOSIS — N289 Disorder of kidney and ureter, unspecified: Secondary | ICD-10-CM | POA: Diagnosis not present

## 2020-07-12 DIAGNOSIS — I639 Cerebral infarction, unspecified: Secondary | ICD-10-CM | POA: Diagnosis not present

## 2020-07-12 MED ORDER — HYDROCHLOROTHIAZIDE 25 MG PO TABS: 25.0000 mg | ORAL_TABLET | Freq: Every day | ORAL | 0 refills | Status: DC

## 2020-07-12 MED ORDER — ATORVASTATIN CALCIUM 40 MG PO TABS: 40.0000 mg | ORAL_TABLET | Freq: Every day | ORAL | 1 refills | Status: DC

## 2020-07-12 MED ORDER — AMLODIPINE BESYLATE 5 MG PO TABS: 5.0000 mg | ORAL_TABLET | Freq: Every day | ORAL | 0 refills | Status: DC

## 2020-07-12 MED ORDER — LISINOPRIL 20 MG PO TABS: 20.0000 mg | ORAL_TABLET | Freq: Every day | ORAL | 2 refills | Status: DC

## 2020-07-12 NOTE — Patient Instructions (Addendum)
   Tongue symptoms appear to be due to your stroke. I would recommend discussing those symptoms with your neurologist, but may need to give that some time to see if it improves.  Return to the clinic or go to the nearest emergency room if any of your symptoms worsen or new symptoms occur.  Continue atorvastatin same dose.   Blood pressure overall is still running a little bit too high. Increase lisinopril to 20mg  pill once per day. Return for lab only visit in 2 weeks.   Keep up the good work with cutting back salt in diet. Keep a record of your blood pressures outside of the office and bring them to the next office visit. If any low readings or lightheadedness be seen right away. if any new side effects of meds let me know.   Lab only visit in 2 weeks, follow-up to see me in person in 6 weeks.  Let me know if there are questions sooner    If you have lab work done today you will be contacted with your lab results within the next 2 weeks.  If you have not heard from [place] then please contact us. The fastest way to get your results is to register for My Chart.   IF you received an x-ray today, you will receive an invoice from [facility]. Please contact [facility] at [phone] with questions or concerns regarding your invoice.   IF you received labwork today, you will receive an invoice from [place]. Please contact [facility] at [phone] with questions or concerns regarding your invoice.   Our billing staff will not be able to assist you with questions regarding bills from these companies.  You will be contacted with the lab results as soon as they are available. The fastest way to get your results is to activate your My Chart account. Instructions are located on the last page of this paperwork. If you have not heard from [place] regarding the results in 2 weeks, please contact this office.

## 2020-07-12 NOTE — Progress Notes (Signed)
Subjective:  Patient ID: Sara Aguirre, female    DOB: 1941/02/18  Age: 79 y.o. MRN: [id number]  CC:  Chief Complaint  Patient presents with  . Hypertension    pt is here after changing to amlodapine 5 mg 2 weeks ago, pt reports swelling is improved but still noticable in her feet, pt did keep a log of recent BP and a copy was retained for the office records.   . Hyperlipidemia    pt is here today for fasting lab work pertaning to her cholesterol pt has been taking atorvastatin and working outdoors.  . Covid Vaccine     pt had her Covid vaccine 3/15, 4/2 pt reports since this time she has had little to no sesne of taste, pt reports feeling as if her entire tongue has been burnt     HPI Sara Aguirre presents for  Hypertension: Complicated by CKD stage III, history of CVA, with residual deficit of numbness on the left side of her tongue, left fingers, mild imbalance and visual impairment.  Admitted May 5-7 of this year.  Treated with Plavix, aspirin, atorvastatin for secondary stroke prevention..  Neurology appt in June.  Aspirin for an additional 2 months discussed at that time then discontinuation. Goal blood pressure below 130/90, A1c below 6.5, LDL below 70.  HTN treated with lisinopril 10 mg daily, HCTZ 25 mg daily.  Previously increase amlodipine from 5 mg to 7.5 mg but increased edema as July 9 visit.  Return to 5 mg with addition of 10 mg lisinopril at that time.  Swelling better in ankles most days.  No new side effects.  Cut back on salt.  Uses 4 pt [place] as needed.  No falls.  Home readings: 125-163/71-96.  BP Readings from Last 3 Encounters:  07/12/20 (!) 152/74  06/25/20 (!) 160/88  05/27/20 (!) 154/84   Lab Results  Component Value Date   CREATININE 1.37 (H) 06/25/2020    Hyperlipidemia: Lipitor 40 mg daily.no new side effects. Fasting today.  Lab Results  Component Value Date   CHOL 194 04/22/2020   HDL 49 04/22/2020   LDLCALC 112 (H) 04/22/2020   TRIG  166 (H) 04/22/2020   CHOLHDL 4.0 04/22/2020   Lab Results  Component Value Date   ALT 12 04/21/2020   AST 16 04/21/2020   ALKPHOS 74 04/21/2020   BILITOT 0.5 04/21/2020    Decreased sense of taste, tongue sensation/burning feeling: Noticed after the CVA as above.  Had covid vaccine prior to the stroke, but tongue symptoms and decreased taste noticed after CVA only.     History Patient Active Problem List   Diagnosis Date Noted  . Acute CVA (cerebrovascular accident) ([place]) 04/21/2020  . Hypokalemia 04/21/2020  . Pure hypercholesterolemia 01/17/2017  . Thyroid nodule 08/20/2013  . HTN (hypertension) 08/20/2013  . Body mass index (BMI) of 24.0-24.9 in adult 08/20/2013  . Gastric ulcer 12/29/2011  . Renal insufficiency 12/29/2011  . History of bladder cancer, s/p cystectomy with ileal conduit    Past Medical History:  Diagnosis Date  . Cancer [facility])    bladder, s/p cystectomy with ileal conduit  . History of bladder cancer   . History of tobacco use   . Hyperlipidemia   . Hypertension   . Incarcerated ventral hernia, s/p lap repair 12/05/2011  . Parastomal hernia of ileal conduit, s/p lap repair [id number] 12/04/2011  . Ulcer, gastric, acute or chronic    Past Surgical History:  Procedure Laterality Date  .  BLADDER REMOVAL  10/1993   Dr. Risa Grill  . HERNIA REPAIR  12/05/11   parastomal  . LOOP RECORDER INSERTION N/A 04/23/2020   Procedure: LOOP RECORDER INSERTION;  Surgeon: Deboraha Sprang, MD;  Location: [facility];  Service: Cardiovascular;  Laterality: N/A;  . PARASTOMAL HERNIA REPAIR  12/05/2011   Procedure: HERNIA REPAIR PARASTOMAL;  Surgeon: Adin Hector, MD;  Location: [facility];  Service: General;;  Laprascopic lysis of adhestons with reduction and repair with biological mesh for incarcerated parastomal and ventral long incisional hernia.  Marland Kitchen REVISION UROSTOMY CUTANEOUS    . VENTRAL HERNIA REPAIR  12/05/2011   Procedure: HERNIA REPAIR VENTRAL ADULT;  Surgeon:  Adin Hector, MD;  Location: [facility];  Service: General;;   Allergies  Allergen Reactions  . Imodium [Loperamide]     Itchy all over  . Loperamide Hcl Rash   Prior to Admission medications   Medication Sig Start Date End Date Taking? Authorizing Provider  amLODipine (NORVASC) 5 MG tablet TAKE 1 TABLET BY MOUTH EVERY DAY 05/07/20  Yes Delia Chimes A, MD  aspirin 325 MG tablet Take 1 tablet (325 mg total) by mouth daily. 04/24/20 07/23/20 Yes Dessa Phi, DO  atorvastatin (LIPITOR) 40 MG tablet Take 1 tablet (40 mg total) by mouth daily. 04/24/20  Yes Dessa Phi, DO  Blood Pressure Monitor DEVI Use to check blood pressure 3 times a week 10/27/19  Yes Stallings, Zoe A, MD  clopidogrel (PLAVIX) 75 MG tablet Take 1 tablet (75 mg total) by mouth daily. 04/24/20  Yes Dessa Phi, DO  cyanocobalamin 1000 MCG tablet Take 1,000 mcg by mouth daily.   Yes [provider]  hydrochlorothiazide (HYDRODIURIL) 25 MG tablet TAKE 1 TABLET BY MOUTH EVERY DAY 05/07/20  Yes Stallings, Zoe A, MD  lisinopril (ZESTRIL) 10 MG tablet Take 1 tablet (10 mg total) by mouth daily. 06/25/20  Yes Wendie Agreste, MD  UNABLE TO [place] [phone] 10/31/18  Yes Delia Chimes A, MD  amLODipine (NORVASC) 2.5 MG tablet Take 1 tablet (2.5 mg total) by mouth daily. Patient not taking: Reported on 07/12/2020 05/27/20   Wendie Agreste, MD   Social History   Socioeconomic History  . Marital status: Widowed    Spouse name: Not on file  . Number of children: 2  . Years of education: Not on file  . Highest education level: Some college, no degree  Occupational History  . Occupation: retired  Tobacco Use  . Smoking status: Former Smoker    Quit date: 01/02/1993    Years since quitting: 27.5  . Smokeless tobacco: Never Used  Vaping Use  . Vaping Use: Never used  Substance and Sexual Activity  . Alcohol use: No  . Drug use: No  . Sexual activity: Not on file  Other Topics Concern  . 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  Gardening      ADLs: drives; independent with ADLs; has garden      Advanced  Directives: YES: full code but no prolonged measures; HCPOA: Orvis Brill or other son Cory Munch; copy on chart 04/2017.   Social Determinants of Health   Financial Resource Strain:   . Difficulty of Paying Living Expenses:   Food Insecurity:   . Worried About Charity fundraiser in the Last Year:   . Arboriculturist in the Last Year:   Transportation Needs:   . Film/video editor (Medical):   Marland Kitchen Lack of Transportation (Non-Medical):   Physical Activity:   . Days of Exercise per Week:   . Minutes of Exercise per Session:   Stress:   . Feeling of Stress :   Social Connections:   . Frequency of Communication with Friends and Family:   . Frequency of Social Gatherings with Friends and Family:   . Attends Religious Services:   . Active Member of Clubs or Organizations:   . Attends Archivist Meetings:   Marland Kitchen Marital Status:   Intimate Partner Violence:   . Fear of Current or Ex-Partner:   . Emotionally Abused:   Marland Kitchen Physically Abused:   . Sexually Abused:     Review of Systems  Constitutional: Negative for fatigue and unexpected weight change.  Respiratory: Negative for chest tightness and shortness of breath.   Cardiovascular: Negative for chest pain, palpitations and leg swelling.  Gastrointestinal: Negative for abdominal pain and blood in stool.  Neurological: Negative for dizziness, syncope, light-headedness and headaches.     Objective:   Vitals:   07/12/20 0931 07/12/20 1003  BP: (!)  175/76 (!) 152/74  Pulse: 71   Resp: 14   Temp: 98 F (36.7 C)   TempSrc: Temporal   SpO2: 98%   Weight: 155 lb 6.4 oz (70.5 kg)   Height: 5\' 8"  (1.727 m)      Physical Exam Vitals reviewed.  Constitutional:      Appearance: She is well-developed.  HENT:     Head: Normocephalic and atraumatic.  Eyes:     Conjunctiva/sclera: Conjunctivae normal.     Pupils: Pupils are equal, round, and reactive to light.  Neck:     Vascular: No carotid bruit.  Cardiovascular:     Rate and Rhythm: Normal rate and regular rhythm.     Heart sounds: Normal heart sounds.  Pulmonary:     Effort: Pulmonary effort is normal.     Breath sounds: Normal breath sounds.  Abdominal:     Palpations: Abdomen is soft. There is no pulsatile mass.     Tenderness: There is no abdominal tenderness.  Skin:    General: Skin is warm and dry.  Neurological:     Mental Status: She is alert and oriented to person, place, and time.     Comments: Slight decrease sensation in her left distal second phalanx, grip strength intact.  No appreciable focal weakness appreciated otherwise.   Psychiatric:        Mood and Affect: Mood normal.        Behavior: Behavior normal.        Assessment & Plan:  SHARA HARTIS is a 79 y.o. female . Uncontrolled hypertension - Plan: Comprehensive metabolic panel, hydrochlorothiazide (HYDRODIURIL) 25 MG tablet, amLODipine (NORVASC) 5 MG tablet, lisinopril (ZESTRIL) 20 MG tablet Renal insufficiency - Plan: Comprehensive metabolic panel   -Overall still decreased control, especially with secondary prevention with prior CVA.  We will try a slight higher dose of lisinopril at 20 mg daily, orthostatic precautions given.  Will  check creatinine today as recent start of lisinopril and CKD, and close follow-up with repeat creatinine in 2 weeks at lab only visit.  Continue to maintain hydration, continue amlodipine 5 mg, HCTZ 25 mg, 6-week office visit.  Mixed hyperlipidemia - Plan: Lipid  panel, atorvastatin (LIPITOR) 40 MG tablet  -Tolerating higher dose Lipitor, check labs.  Cerebrovascular accident (CVA), unspecified mechanism ([place]) - Plan: Lipid panel, atorvastatin (LIPITOR) 40 MG tablet Decreased sense of taste  -Continuing secondary prevention.  Will remain on aspirin and Plavix, possible aspirin discontinuation after 1 more month per last neurology note.  Tighter blood pressure control planned as above, check LDL, goal of less than 70.  -Decreased taste, tongue sensation since CVA, unlikely related to Covid vaccine.  Recommended discussing further with her neurologist.  Meds ordered this encounter  Medications  . hydrochlorothiazide (HYDRODIURIL) 25 MG tablet    Sig: Take 1 tablet (25 mg total) by mouth daily.    Dispense:  90 tablet    Refill:  0  . amLODipine (NORVASC) 5 MG tablet    Sig: Take 1 tablet (5 mg total) by mouth daily.    Dispense:  90 tablet    Refill:  0  . lisinopril (ZESTRIL) 20 MG tablet    Sig: Take 1 tablet (20 mg total) by mouth daily.    Dispense:  30 tablet    Refill:  2  . atorvastatin (LIPITOR) 40 MG tablet    Sig: Take 1 tablet (40 mg total) by mouth daily.    Dispense:  90 tablet    Refill:  1   Patient Instructions     Tongue symptoms appear to be due to your stroke. I would recommend discussing those symptoms with your neurologist, but may need to give that some time to see if it improves.  Return to the clinic or go to the nearest emergency room if any of your symptoms worsen or new symptoms occur.  Continue atorvastatin same dose.   Blood pressure overall is still running a little bit too high. Increase lisinopril to 20mg  pill once per day. Return for lab only visit in 2 weeks.   Keep up the good work with cutting back salt in diet. Keep a record of your blood pressures outside of the office and bring them to the next office visit. If any low readings or lightheadedness be seen right away. if any new side effects of meds  let me know.   Lab only visit in 2 weeks, follow-up to see me in person in 6 weeks.  Let me know if there are questions sooner    If you have lab work done today you will be contacted with your lab results within the next 2 weeks.  If you have not heard from [place] then please contact us. The fastest way to get your results is to register for My Chart.   IF you received an x-ray today, you will receive an invoice from [facility]. Please contact [facility] at [phone] with questions or concerns regarding your invoice.   IF you received labwork today, you will receive an invoice from [facility]. Please contact [facility] at [phone] with questions or concerns regarding your invoice.   Our billing staff will not be able to assist you with questions regarding bills from these companies.  You will be contacted with the lab results as soon as they are available. The fastest way to get your results is to activate your My Chart account. Instructions are  located on the last page of this paperwork. If you have not heard from [place] regarding the results in 2 weeks, please contact this office.         Signed, Merri Ray, MD [facility]

## 2020-07-13 LAB — LIPID PANEL
Chol/HDL Ratio: 2.3 ratio (ref 0.0–4.4)
Cholesterol, Total: 150 mg/dL (ref 100–199)
HDL: 64 mg/dL (ref >39)
LDL Chol Calc (NIH): 63 mg/dL (ref 0–99)
Triglycerides: 133 mg/dL (ref 0–149)
VLDL Cholesterol Cal: 23 mg/dL (ref 5–40)

## 2020-07-13 LAB — COMPREHENSIVE METABOLIC PANEL
ALT: 12 IU/L (ref 0–32)
AST: 19 IU/L (ref 0–40)
Albumin/Globulin Ratio: 1.5 (ref 1.2–2.2)
Albumin: 4.1 g/dL (ref 3.6–4.6)
Alkaline Phosphatase: 101 IU/L (ref 48–121)
BUN/Creatinine Ratio: 14 (ref 12–28)
BUN: 18 mg/dL (ref 8–27)
Bilirubin Total: 0.5 mg/dL (ref 0.0–1.2)
CO2: 27 mmol/L (ref 20–29)
Calcium: 9.3 mg/dL (ref 8.7–10.3)
Chloride: 100 mmol/L (ref 96–106)
Creatinine, Ser: 1.3 mg/dL — ABNORMAL HIGH (ref 0.57–1.00)
GFR calc Af Amer: 44 mL/min/{1.73_m2} — ABNORMAL LOW (ref >59)
GFR calc non Af Amer: 39 mL/min/{1.73_m2} — ABNORMAL LOW (ref >59)
Globulin, Total: 2.7 g/dL (ref 1.5–4.5)
Glucose: 92 mg/dL (ref 65–99)
Potassium: 4.2 mmol/L (ref 3.5–5.2)
Sodium: 141 mmol/L (ref 134–144)
Total Protein: 6.8 g/dL (ref 6.0–8.5)

## 2020-07-14 ENCOUNTER — Encounter: Payer: Self-pay | Admitting: Family Medicine

## 2020-07-22 ENCOUNTER — Telehealth: Payer: Self-pay | Admitting: Family Medicine

## 2020-07-22 DIAGNOSIS — Z936 Other artificial openings of urinary tract status: Secondary | ICD-10-CM | POA: Diagnosis not present

## 2020-07-22 NOTE — Telephone Encounter (Signed)
Order was given to [facility] to give to [place] to sign.

## 2020-07-22 NOTE — Telephone Encounter (Signed)
Anderson Malta calling from [employer] is going to fax over an order. [phone]

## 2020-07-26 ENCOUNTER — Other Ambulatory Visit: Payer: Self-pay

## 2020-07-26 ENCOUNTER — Ambulatory Visit (INDEPENDENT_AMBULATORY_CARE_PROVIDER_SITE_OTHER): Payer: PPO | Admitting: Family Medicine

## 2020-07-26 DIAGNOSIS — I639 Cerebral infarction, unspecified: Secondary | ICD-10-CM | POA: Diagnosis not present

## 2020-07-26 DIAGNOSIS — I10 Essential (primary) hypertension: Secondary | ICD-10-CM

## 2020-07-26 DIAGNOSIS — E7800 Pure hypercholesterolemia, unspecified: Secondary | ICD-10-CM | POA: Diagnosis not present

## 2020-07-26 DIAGNOSIS — E782 Mixed hyperlipidemia: Secondary | ICD-10-CM | POA: Diagnosis not present

## 2020-07-27 LAB — LIPID PANEL
Chol/HDL Ratio: 2.5 ratio (ref 0.0–4.4)
Cholesterol, Total: 139 mg/dL (ref 100–199)
HDL: 55 mg/dL (ref >39)
LDL Chol Calc (NIH): 60 mg/dL (ref 0–99)
Triglycerides: 140 mg/dL (ref 0–149)
VLDL Cholesterol Cal: 24 mg/dL (ref 5–40)

## 2020-08-02 ENCOUNTER — Ambulatory Visit (INDEPENDENT_AMBULATORY_CARE_PROVIDER_SITE_OTHER): Payer: PPO | Admitting: *Deleted

## 2020-08-02 DIAGNOSIS — I639 Cerebral infarction, unspecified: Secondary | ICD-10-CM

## 2020-08-02 LAB — CUP PACEART REMOTE DEVICE CHECK
Date Time Interrogation Session: 20210813230643
Implantable Pulse Generator Implant Date: 20210507

## 2020-08-04 NOTE — Progress Notes (Signed)
[facility] Summary Report / Loop Recorder 

## 2020-08-26 ENCOUNTER — Ambulatory Visit (INDEPENDENT_AMBULATORY_CARE_PROVIDER_SITE_OTHER): Payer: PPO | Admitting: Family Medicine

## 2020-08-26 ENCOUNTER — Telehealth: Payer: Self-pay | Admitting: Family Medicine

## 2020-08-26 ENCOUNTER — Other Ambulatory Visit: Payer: Self-pay

## 2020-08-26 ENCOUNTER — Encounter: Payer: Self-pay | Admitting: Family Medicine

## 2020-08-26 VITALS — BP 154/89 | HR 111 | Temp 98.1°F | Ht 68.0 in | Wt 155.0 lb

## 2020-08-26 DIAGNOSIS — I639 Cerebral infarction, unspecified: Secondary | ICD-10-CM

## 2020-08-26 DIAGNOSIS — R5383 Other fatigue: Secondary | ICD-10-CM

## 2020-08-26 DIAGNOSIS — I6930 Unspecified sequelae of cerebral infarction: Secondary | ICD-10-CM | POA: Diagnosis not present

## 2020-08-26 DIAGNOSIS — I10 Essential (primary) hypertension: Secondary | ICD-10-CM

## 2020-08-26 MED ORDER — CLOPIDOGREL BISULFATE 75 MG PO TABS: 75.0000 mg | ORAL_TABLET | Freq: Every day | ORAL | 2 refills | Status: DC

## 2020-08-26 NOTE — Telephone Encounter (Signed)
Called pt LVM for her to call back per Dr.Greene request to schedule 1 month follow up  for Hypertension, fatigue. From today's visit

## 2020-08-26 NOTE — Patient Instructions (Addendum)
With some low readings, I am hesitant to change  blood pressure medications at this time.  Continue to watch salt in the diet.  Recheck in 1 month and bring your blood pressure machine to the next office visit to make sure it is obtaining accurate readings.  Fatigue could be from multiple different causes.  Make sure you are drinking sufficient fluids throughout the day.  I will check some electrolytes as well as blood counts today.  If fatigue is not improved in the next month, I would also consider a sleep study to make sure that sleep apnea is not a factor.  Return to the clinic or go to the nearest emergency room if any of your symptoms worsen or new symptoms occur.    Fatigue If you have fatigue, you feel tired all the time and have a lack of energy or a lack of motivation. Fatigue may make it difficult to start or complete tasks because of exhaustion. In general, occasional or mild fatigue is often a normal response to activity or life. However, long-lasting (chronic) or extreme fatigue may be a symptom of a medical condition. Follow these instructions at home: General instructions  Watch your fatigue for any changes.  Go to bed and get up at the same time every day.  Avoid fatigue by pacing yourself during the day and getting enough sleep at night.  Maintain a healthy weight. Medicines  Take over-the-counter and prescription medicines only as told by your health care provider.  Take a multivitamin, if told by your health care provider.  Do not use herbal or dietary supplements unless they are approved by your health care provider. Activity   Exercise regularly, as told by your health care provider.  Use or practice techniques to help you relax, such as yoga, tai chi, meditation, or massage therapy. Eating and drinking   Avoid heavy meals in the evening.  Eat a well-balanced diet, which includes lean proteins, whole grains, plenty of fruits and vegetables, and low-fat  dairy products.  Avoid consuming too much caffeine.  Avoid the use of alcohol.  Drink enough fluid to keep your urine pale yellow. Lifestyle  Change situations that cause you stress. Try to keep your work and personal schedule in balance.  Do not use any products that contain nicotine or tobacco, such as cigarettes and e-cigarettes. If you need help quitting, ask your health care provider.  Do not use drugs. Contact a health care provider if:  Your fatigue does not get better.  You have a fever.  You suddenly lose or gain weight.  You have headaches.  You have trouble falling asleep or sleeping through the night.  You feel angry, guilty, anxious, or sad.  You are unable to have a bowel movement (constipation).  Your skin is dry.  You have swelling in your legs or another part of your body. Get help right away if:  You feel confused.  Your vision is blurry.  You feel faint or you pass out.  You have a severe headache.  You have severe pain in your abdomen, your back, or the area between your waist and hips (pelvis).  You have chest pain, shortness of breath, or an irregular or fast heartbeat.  You are unable to urinate, or you urinate less than normal.  You have abnormal bleeding, such as bleeding from the rectum, vagina, nose, lungs, or nipples.  You vomit blood.  You have thoughts about hurting yourself or others. If you ever  feel like you may hurt yourself or others, or have thoughts about taking your own life, get help right away. You can go to your nearest emergency department or call:  Your local emergency services (911 in the [place]  A suicide crisis helpline, such as the [place] at 814-134-9378. This is open 24 hours a day. Summary  If you have fatigue, you feel tired all the time and have a lack of energy or a lack of motivation.  Fatigue may make it difficult to start or complete tasks because of  exhaustion.  Long-lasting (chronic) or extreme fatigue may be a symptom of a medical condition.  Exercise regularly, as told by your health care provider.  Change situations that cause you stress. Try to keep your work and personal schedule in balance. This information is not intended to replace advice given to you by your health care provider. Make sure you discuss any questions you have with your health care provider. Document Revised: 06/25/2019 Document Reviewed: 08/29/2017 Elsevier Patient Education  [employer].   If you have lab work done today you will be contacted with your lab results within the next 2 weeks.  If you have not heard from [place] then please contact us. The fastest way to get your results is to register for My Chart.   IF you received an x-ray today, you will receive an invoice from [facility]. Please contact [facility]iology at [phone] with questions or concerns regarding your invoice.   IF you received labwork today, you will receive an invoice from [facility]. Please contact [facility] at [phone] with questions or concerns regarding your invoice.   Our billing staff will not be able to assist you with questions regarding bills from these companies.  You will be contacted with the lab results as soon as they are available. The fastest way to get your results is to activate your My Chart account. Instructions are located on the last page of this paperwork. If you have not heard from [place] regarding the results in 2 weeks, please contact this office.

## 2020-08-26 NOTE — Progress Notes (Signed)
Subjective:  Patient ID: Sara Aguirre, female    DOB: [birth year]  Age: 79 y.o. MRN: [id number]  CC:  Chief Complaint  Patient presents with   Hypertension    c/o lack of energy at times , checks BP at home - has a log sheet    HPI Sara Aguirre presents for   Hypertension: Follow-up from July 26 visit.  Complicated by CKD, history of CVA in May of this year.  Goal blood pressure below 130/90.  She is on statin with Lipitor 40 mg daily, Lisinopril increased to 20 mg daily at July visit.  Continued on HCTZ 25 mg daily, amlodipine 5 mg daily.  Neurology plan in June was to continue aspirin for 2 additional months then discontinued, continued on Plavix 75 mg daily - ran out few days ago.  Urinating more. No dysuria, fever, abdominal pain. No missed doses.   No new weakness or chest pain.  Fatigue at times during the day after eating. 3 meals per day.  2-3 16oz bottles water during the day. No alcohol.  ? Possible snoring, but no one at home to witness. Sleeping well. Some naps during day. Ostomy bag - empties once per night. Fatigue not limiting activities.  Home readings: 128/75, (after mowing yard), 153/84, 129/77, 157/81. Overall in 130's - 150's/70-80's.   BP Readings from Last 3 Encounters:  08/26/20 (!) 154/89  07/12/20 (!) 152/74  06/25/20 (!) 160/88   Lab Results  Component Value Date   CREATININE 1.30 (H) 07/12/2020      . History Patient Active Problem List   Diagnosis Date Noted   Acute CVA (cerebrovascular accident) ([place]) 04/21/2020   Hypokalemia 04/21/2020   Pure hypercholesterolemia 01/17/2017   Thyroid nodule 08/20/2013   HTN (hypertension) 08/20/2013   Body mass index (BMI) of 24.0-24.9 in adult 08/20/2013   Gastric ulcer 12/29/2011   Renal insufficiency 12/29/2011   History of bladder cancer, s/p cystectomy with ileal conduit    Past Medical History:  Diagnosis Date   Cancer ([place])    bladder, s/p cystectomy with ileal conduit   History  of bladder cancer    History of tobacco use    Hyperlipidemia    Hypertension    Incarcerated ventral hernia, s/p lap repair 12/05/2011   Parastomal hernia of ileal conduit, s/p lap repair [id number] 12/04/2011   Ulcer, gastric, acute or chronic    Past Surgical History:  Procedure Laterality Date   BLADDER REMOVAL  10/1993   Dr. Risa Grill   HERNIA REPAIR  12/05/11   parastomal   LOOP RECORDER INSERTION N/A 04/23/2020   Procedure: LOOP RECORDER INSERTION;  Surgeon: Deboraha Sprang, MD;  Location: [facility];  Service: Cardiovascular;  Laterality: N/A;   PARASTOMAL HERNIA REPAIR  12/05/2011   Procedure: HERNIA REPAIR PARASTOMAL;  Surgeon: Adin Hector, MD;  Location: [facility];  Service: General;;  Laprascopic lysis of adhestons with reduction and repair with biological mesh for incarcerated parastomal and ventral long incisional hernia.   REVISION UROSTOMY CUTANEOUS     VENTRAL HERNIA REPAIR  12/05/2011   Procedure: HERNIA REPAIR VENTRAL ADULT;  Surgeon: Adin Hector, MD;  Location: [facility];  Service: General;;   Allergies  Allergen Reactions   Imodium [Loperamide]     Itchy all over   Loperamide Hcl Rash   Prior to Admission medications   Medication Sig Start Date End Date Taking? Authorizing Provider  amLODipine (NORVASC) 5 MG tablet Take 1 tablet (5 mg  total) by mouth daily. 07/12/20  Yes Wendie Agreste, MD  atorvastatin (LIPITOR) 40 MG tablet Take 1 tablet (40 mg total) by mouth daily. 07/12/20  Yes Wendie Agreste, MD  Blood Pressure Monitor DEVI Use to check blood pressure 3 times a week 10/27/19  Yes Stallings, Zoe A, MD  cyanocobalamin 1000 MCG tablet Take 1,000 mcg by mouth daily.   Yes [provider]  hydrochlorothiazide (HYDRODIURIL) 25 MG tablet Take 1 tablet (25 mg total) by mouth daily. 07/12/20  Yes Wendie Agreste, MD  lisinopril (ZESTRIL) 20 MG tablet Take 1 tablet (20 mg total) by mouth daily. 07/12/20  Yes Wendie Agreste, MD    UNABLE TO [place] [phone] 10/31/18  Yes Forrest Moron, MD  clopidogrel (PLAVIX) 75 MG tablet Take 1 tablet (75 mg total) by mouth daily. Patient not taking: Reported on 08/26/2020 04/24/20   Dessa Phi, DO   Social History   Socioeconomic History   Marital status: Widowed    Spouse name: Not on file   Number of children: 2   Years of education: Not on file   Highest education level: Some college, no degree  Occupational History   Occupation: retired  Tobacco Use   Smoking status: Former Smoker    Quit date: 01/02/1993    Years since quitting: 27.6   Smokeless tobacco: Never Used  Vaping Use   Vaping Use: Never used  Substance and Sexual Activity   Alcohol use: No   Drug use: No   Sexual activity: Not on file  Other Topics Concern   Not on file  Social History Narrative   Marital status: widowed since 1992; not dating; not interested      Children:  Twin boys (70); 2 grandchildren      Lives: alone with dog, 5 chickens, 2 rabbits      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  Gardening      ADLs: drives; independent with ADLs; has garden      Advanced Directives: YES: full code but no prolonged measures; HCPOA: Orvis Brill or other son Cory Munch; copy on chart 04/2017.   Social Determinants of Health   Financial Resource Strain:    Difficulty of Paying Living Expenses: Not on file  Food Insecurity:    Worried About Charity fundraiser in the Last Year: Not on file   [employer] of Food in the Last Year: Not on file  Transportation Needs:    Lack of Transportation (Medical): Not on file   Lack of Transportation (Non-Medical): Not on file  Physical Activity:    Days of Exercise per Week: Not on file   Minutes of Exercise per Session: Not on file  Stress:    Feeling of  Stress : Not on file  Social Connections:    Frequency of Communication with Friends and Family: Not on file   Frequency of Social Gatherings with Friends and Family: Not on file   Attends Religious Services: Not on file   Active Member of Clubs or Organizations: Not on file   Attends Archivist Meetings: Not on file   Marital Status: Not on file  Intimate Partner Violence:    Fear of Current or Ex-Partner: Not on file  Emotionally Abused: Not on file   Physically Abused: Not on file   Sexually Abused: Not on file    Review of Systems  Constitutional: Negative for unexpected weight change.  Respiratory: Negative for chest tightness and shortness of breath.   Cardiovascular: Negative for chest pain, palpitations and leg swelling.  Gastrointestinal: Negative for abdominal pain and blood in stool (no dark stools).  Neurological: Positive for light-headedness (only if getting up suddenly. ) and headaches (rare - few seconds. ). Negative for dizziness and syncope.     Objective:   Vitals:   08/26/20 0918 08/26/20 0925  BP: (!) 170/100 (!) 154/89  Pulse: (!) 111   Temp: 98.1 F (36.7 C)   SpO2: 96%   Weight: 155 lb (70.3 kg)   Height: 5' 8"  (1.727 m)      Physical Exam Vitals reviewed.  Constitutional:      Appearance: She is well-developed.  HENT:     Head: Normocephalic and atraumatic.  Eyes:     Conjunctiva/sclera: Conjunctivae normal.     Pupils: Pupils are equal, round, and reactive to light.  Neck:     Vascular: No carotid bruit.  Cardiovascular:     Rate and Rhythm: Normal rate and regular rhythm.     Heart sounds: Normal heart sounds.  Pulmonary:     Effort: Pulmonary effort is normal.     Breath sounds: Normal breath sounds.  Abdominal:     Palpations: Abdomen is soft. There is no pulsatile mass.     Tenderness: There is no abdominal tenderness.  Skin:    General: Skin is warm and dry.  Neurological:     Mental Status: She is alert  and oriented to person, place, and time.  Psychiatric:        Behavior: Behavior normal.        Assessment & Plan:  Sara Aguirre is a 79 y.o. female . Uncontrolled hypertension - Plan: Basic metabolic panel  -Elevated readings in office, but some variable readings at home including lower normal readings.  I am concerned with increase in medications as that may worsen fatigue, and orthostasis.  Continue same regimen, monitor sodium intake, recheck in 1 month with home monitor.  Cerebrovascular accident (CVA), unspecified mechanism ([place]) - Plan: clopidogrel (PLAVIX) 75 MG tablet  -Denies any new symptoms, refilled Plavix.  Off aspirin.  Fatigue, unspecified type - Plan: Basic metabolic panel, CBC  -Intermittent after meals.  Continued hydration, regular meals throughout the day discussed.  She expects fatigue to improve with restart of bowling.  Would consider sleep study if persistent.  Check CBC, BMP, recheck 1 month with RTC precautions.   Meds ordered this encounter  Medications   clopidogrel (PLAVIX) 75 MG tablet    Sig: Take 1 tablet (75 mg total) by mouth daily.    Dispense:  30 tablet    Refill:  2   Patient Instructions     With some low readings, I am hesitant to change  blood pressure medications at this time.  Continue to watch salt in the diet.  Recheck in 1 month and bring your blood pressure machine to the next office visit to make sure it is obtaining accurate readings.  Fatigue could be from multiple different causes.  Make sure you are drinking sufficient fluids throughout the day.  I will check some electrolytes as well as blood counts today.  If fatigue is not improved in the next month, I would also consider a sleep study  to make sure that sleep apnea is not a factor.  Return to the clinic or go to the nearest emergency room if any of your symptoms worsen or new symptoms occur.    Fatigue If you have fatigue, you feel tired all the time and have a lack of  energy or a lack of motivation. Fatigue may make it difficult to start or complete tasks because of exhaustion. In general, occasional or mild fatigue is often a normal response to activity or life. However, long-lasting (chronic) or extreme fatigue may be a symptom of a medical condition. Follow these instructions at home: General instructions  Watch your fatigue for any changes.  Go to bed and get up at the same time every day.  Avoid fatigue by pacing yourself during the day and getting enough sleep at night.  Maintain a healthy weight. Medicines  Take over-the-counter and prescription medicines only as told by your health care provider.  Take a multivitamin, if told by your health care provider.  Do not use herbal or dietary supplements unless they are approved by your health care provider. Activity   Exercise regularly, as told by your health care provider.  Use or practice techniques to help you relax, such as yoga, tai chi, meditation, or massage therapy. Eating and drinking   Avoid heavy meals in the evening.  Eat a well-balanced diet, which includes lean proteins, whole grains, plenty of fruits and vegetables, and low-fat dairy products.  Avoid consuming too much caffeine.  Avoid the use of alcohol.  Drink enough fluid to keep your urine pale yellow. Lifestyle  Change situations that cause you stress. Try to keep your work and personal schedule in balance.  Do not use any products that contain nicotine or tobacco, such as cigarettes and e-cigarettes. If you need help quitting, ask your health care provider.  Do not use drugs. Contact a health care provider if:  Your fatigue does not get better.  You have a fever.  You suddenly lose or gain weight.  You have headaches.  You have trouble falling asleep or sleeping through the night.  You feel angry, guilty, anxious, or sad.  You are unable to have a bowel movement (constipation).  Your skin is  dry.  You have swelling in your legs or another part of your body. Get help right away if:  You feel confused.  Your vision is blurry.  You feel faint or you pass out.  You have a severe headache.  You have severe pain in your abdomen, your back, or the area between your waist and hips (pelvis).  You have chest pain, shortness of breath, or an irregular or fast heartbeat.  You are unable to urinate, or you urinate less than normal.  You have abnormal bleeding, such as bleeding from the rectum, vagina, nose, lungs, or nipples.  You vomit blood.  You have thoughts about hurting yourself or others. If you ever feel like you may hurt yourself or others, or have thoughts about taking your own life, get help right away. You can go to your nearest emergency department or call:  Your local emergency services (911 in the [place]  A suicide crisis helpline, such as the [place] at 780-575-7929. This is open 24 hours a day. Summary  If you have fatigue, you feel tired all the time and have a lack of energy or a lack of motivation.  Fatigue may make it difficult to start or complete tasks because of  exhaustion.  Long-lasting (chronic) or extreme fatigue may be a symptom of a medical condition.  Exercise regularly, as told by your health care provider.  Change situations that cause you stress. Try to keep your work and personal schedule in balance. This information is not intended to replace advice given to you by your health care provider. Make sure you discuss any questions you have with your health care provider. Document Revised: 06/25/2019 Document Reviewed: 08/29/2017 Elsevier Patient Education  [employer].   If you have lab work done today you will be contacted with your lab results within the next 2 weeks.  If you have not heard from [place] then please contact us. The fastest way to get your results is to register for My Chart.   IF you  received an x-ray today, you will receive an invoice from [facility]. Please contact [facility] at [phone] with questions or concerns regarding your invoice.   IF you received labwork today, you will receive an invoice from [place]. Please contact [facility] at [phone] with questions or concerns regarding your invoice.   Our billing staff will not be able to assist you with questions regarding bills from these companies.  You will be contacted with the lab results as soon as they are available. The fastest way to get your results is to activate your My Chart account. Instructions are located on the last page of this paperwork. If you have not heard from [place] regarding the results in 2 weeks, please contact this office.          Signed, Merri Ray, MD [facility]

## 2020-08-27 LAB — CBC
Hematocrit: 43.6 % (ref 34.0–46.6)
Hemoglobin: 14.7 g/dL (ref 11.1–15.9)
MCH: 30.8 pg (ref 26.6–33.0)
MCHC: 33.7 g/dL (ref 31.5–35.7)
MCV: 91 fL (ref 79–97)
Platelets: 312 10*3/uL (ref 150–450)
RBC: 4.78 x10E6/uL (ref 3.77–5.28)
RDW: 11.9 % (ref 11.7–15.4)
WBC: 11.6 10*3/uL — ABNORMAL HIGH (ref 3.4–10.8)

## 2020-08-27 LAB — BASIC METABOLIC PANEL
BUN/Creatinine Ratio: 12 (ref 12–28)
BUN: 17 mg/dL (ref 8–27)
CO2: 25 mmol/L (ref 20–29)
Calcium: 9.4 mg/dL (ref 8.7–10.3)
Chloride: 100 mmol/L (ref 96–106)
Creatinine, Ser: 1.4 mg/dL — ABNORMAL HIGH (ref 0.57–1.00)
GFR calc Af Amer: 41 mL/min/{1.73_m2} — ABNORMAL LOW (ref >59)
GFR calc non Af Amer: 35 mL/min/{1.73_m2} — ABNORMAL LOW (ref >59)
Glucose: 100 mg/dL — ABNORMAL HIGH (ref 65–99)
Potassium: 3.9 mmol/L (ref 3.5–5.2)
Sodium: 142 mmol/L (ref 134–144)

## 2020-09-06 ENCOUNTER — Ambulatory Visit (INDEPENDENT_AMBULATORY_CARE_PROVIDER_SITE_OTHER): Payer: PPO | Admitting: *Deleted

## 2020-09-06 DIAGNOSIS — I639 Cerebral infarction, unspecified: Secondary | ICD-10-CM | POA: Diagnosis not present

## 2020-09-07 LAB — CUP PACEART REMOTE DEVICE CHECK
Date Time Interrogation Session: 20210915230437
Implantable Pulse Generator Implant Date: 20210507

## 2020-09-08 ENCOUNTER — Ambulatory Visit: Payer: PPO | Admitting: Adult Health

## 2020-09-08 NOTE — Progress Notes (Signed)
[facility] Summary Report / Loop Recorder 

## 2020-09-16 ENCOUNTER — Ambulatory Visit (INDEPENDENT_AMBULATORY_CARE_PROVIDER_SITE_OTHER): Payer: PPO | Admitting: Adult Health

## 2020-09-16 ENCOUNTER — Encounter: Payer: Self-pay | Admitting: Adult Health

## 2020-09-16 VITALS — BP 158/68 | HR 65 | Ht 68.0 in | Wt 157.0 lb

## 2020-09-16 DIAGNOSIS — I10 Essential (primary) hypertension: Secondary | ICD-10-CM | POA: Diagnosis not present

## 2020-09-16 DIAGNOSIS — E785 Hyperlipidemia, unspecified: Secondary | ICD-10-CM

## 2020-09-16 DIAGNOSIS — I639 Cerebral infarction, unspecified: Secondary | ICD-10-CM

## 2020-09-16 NOTE — Patient Instructions (Signed)
Continue clopidogrel 75 mg daily  and atorvastatin  for secondary stroke prevention  Your loop recorder will continue to be monitored for possible atrial fibrillation   Continue to follow up with PCP regarding cholesterol and blood pressure management  Maintain strict control of hypertension with blood pressure goal below 130/90 and cholesterol with LDL cholesterol (bad cholesterol) goal below 70 mg/dL.      Followup in the future with me in 6 months or call earlier if needed       Thank you for coming to see [place] at [facility]. I hope we have been able to provide you high quality care today.  You may receive a patient satisfaction survey over the next few weeks. We would appreciate your feedback and comments so that we may continue to improve ourselves and the health of our patients.

## 2020-09-16 NOTE — Progress Notes (Signed)
[facility] [address] [place]. [place] [address] [phone]       STROKE FOLLOW UP NOTE  [name] Date of Birth:  31-Oct-1941 Medical Record Number:  [id number]   Reason for Referral: stroke follow up    SUBJECTIVE:   CHIEF COMPLAINT:  Chief Complaint  Patient presents with  . Follow-up    rm 9  . Cerebrovascular Accident    Pt is having no new sx. Pt said she never had sx.    HPI:   Today, 09/16/2020, Sara Aguirre returns for 93-month stroke follow-up unaccompanied.  Stable since prior visit with residual left hand numbness and altered taste. She does report some improvement since prior visit. Complete resolution of imbalance and visual impairment.  Denies new or worsening stroke/TIA symptoms.  Completed 3 months DAPT and remains on Plavix alone without bleeding or bruising.  Remains on atorvastatin 40 mg daily without myalgias.  Blood pressure today 158/68 - monitors at home which has been stable.  Loop recorder has not shown atrial fibrillation thus far.  No concerns at this time.    History provided for reference purposes only Update 05/26/2020 JM: Sara Aguirre is being seen for hospital follow-up accompanied by her son.  Residual deficits of numbness on the side of her tongue and left fingers as well as mild imbalance and visual impairment.  Son endorses initially cognitive concerns and moderate gait abnormality which has improved over the past week.  She did not participate in any type of therapy after discharge.  She is questioning return to driving.  She has been slowly returning to independent level functioning.  Continues on aspirin 325 mg daily and Plavix 75 mg daily without bleeding but easy bruising and plans on continuing over the next 2 months.  Continues on atorvastatin 40 mg daily without myalgias.  Blood pressure today 152/84. Monitors at home and typically 140s/80s.  Loop recorder has not shown atrial fibrillation thus far.  No further  concerns at this time.   Stroke admission 04/21/2020 Sara Aguirre is a 79 y.o. 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  Eventually transferred to [facility] ED with stroke work-up revealing right PCA (mesial right temporal lobe/hippocampus, medial right temporal lobe, right occipital lobe and callosal splenium) and right thalamic infarcts, embolic secondary to unknown source.  CTA showed fetal right PCA occlusion/near occlusive stenosis and bilateral M2 stenosis with severe stenosis proximal left M2.  Loop recorder placed to evaluate for atrial fibrillation as stroke etiology.  Recommended DAPT for 3 months then Plavix alone due to PCA occlusion.  Presented with hypertensive urgency with BP 198/114 stabilized during admission with long-term BP goal normotensive range.  LDL 112 and increased atorvastatin dose from 20 mg to 40 mg daily.  Other stroke risk factors include advanced age, former tobacco use and family history of stroke but no personal history of stroke.  Other active problems include hx of bladder cancer s/p cystectomy w/ ileal conduit 1994, CKD stage III and known thyroid nodules.  Residual deficits of left upper quadrantanopia and decreased sensory with therapy recommending home health versus outpatient PT and discharged home in stable condition.  Stroke:   R PCA including R thalamic infarcts, embolic secondary to unknown source  CT head No acute abnormality.  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 Atrophy. Sinus dz.     CTA head fetal R PCA w/ occlusion/near occlusive stenosis. B M2 stenosis w/ severe stenoses proximal L M2. B ICA w/ mild atherosclerosis.   CTA neck R ICA  30-40% stenosis. L ICA w/ mild plaque (origin not seen). Proximal L V1 high-grade stenosis vs artifact.  MRI  R  PCA territory infarcts. Small R thalamic infarcts. Small vessel disease. Atrophy.   LE Doppler no DVT  2D Echo EF 55-60%, no RLS  implantable loop recorder placed 5/7 to evaluate for atrial fibrillation as etiology of stroke    LDL 112  HgbA1c 5.9  Lovenox 40 mg sq daily for VTE prophylaxis  aspirin 81 mg daily prior to admission, now on aspirin 325 mg daily and clopidogrel 75 mg daily. Continue DAPT x 3 months then plavix alone given PCA occlusion   Therapy recommendations:  HH vs OP PT. No OT or SLP.  Disposition:  Return home     ROS:   14 system review of systems performed and negative with exception of numbness  PMH:  Past Medical History:  Diagnosis Date  . Cancer [facility])    bladder, s/p cystectomy with ileal conduit  . History of bladder cancer   . History of tobacco use   . Hyperlipidemia   . Hypertension   . Incarcerated ventral hernia, s/p lap repair 12/05/2011  . Parastomal hernia of ileal conduit, s/p lap repair [id number] 12/04/2011  . Ulcer, gastric, acute or chronic     PSH:  Past Surgical History:  Procedure Laterality Date  . BLADDER REMOVAL  10/1993   Dr. Risa Grill  . HERNIA REPAIR  12/05/11   parastomal  . LOOP RECORDER INSERTION N/A 04/23/2020   Procedure: LOOP RECORDER INSERTION;  Surgeon: Deboraha Sprang, MD;  Location: [facility];  Service: Cardiovascular;  Laterality: N/A;  . PARASTOMAL HERNIA REPAIR  12/05/2011   Procedure: HERNIA REPAIR PARASTOMAL;  Surgeon: Adin Hector, MD;  Location: [facility];  Service: General;;  Laprascopic lysis of adhestons with reduction and repair with biological mesh for incarcerated parastomal and ventral long incisional hernia.  Marland Kitchen REVISION UROSTOMY CUTANEOUS    . VENTRAL HERNIA REPAIR  12/05/2011   Procedure: HERNIA REPAIR VENTRAL ADULT;  Surgeon: Adin Hector, MD;  Location: [facility];  Service: General;;    Social History:  Social History   Socioeconomic History  . Marital status: Widowed    Spouse  name: Not on file  . Number of children: 2  . Years of education: Not on file  . Highest education level: Some college, no degree  Occupational History  . Occupation: retired  Tobacco Use  . Smoking status: Former Smoker    Quit date: 01/02/1993    Years since quitting: 27.7  . Smokeless tobacco: Never Used  Vaping Use  . Vaping Use: Never used  Substance and Sexual Activity  . Alcohol use: No  . Drug use: No  . Sexual activity: Not on file  Other Topics Concern  . 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  Gardening      ADLs: drives; independent with ADLs; has garden      Advanced Directives: YES: full code but no prolonged measures; HCPOA: Orvis Brill or other son Cory Munch; copy on chart 04/2017.   Social Determinants of Health   Financial Resource Strain:   . Difficulty of Paying Living Expenses: Not on file  Food Insecurity:   . Worried About Charity fundraiser in the Last Year: Not on file  . Ran Out of Food in the Last Year: Not on file  Transportation Needs:   . Lack of Transportation (Medical): Not on file  . Lack of Transportation (Non-Medical): Not on file  Physical Activity:   . Days of Exercise per Week: Not on file  . Minutes of Exercise per Session: Not on file  Stress:   . Feeling of Stress : Not on file  Social Connections:   . Frequency of Communication with Friends and Family: Not on file  . Frequency of Social Gatherings with Friends and Family: Not on file  . Attends Religious Services: Not on file  . Active Member of Clubs or Organizations: Not on file  . Attends Archivist Meetings:  Not on file  . Marital Status: Not on file  Intimate Partner Violence:   . Fear of Current or Ex-Partner: Not on file  . Emotionally Abused: Not on file  . Physically Abused: Not on file  . Sexually Abused: Not on file    Family History:  Family History  Problem Relation Age of Onset  . Heart disease Mother 36       CHF  . Alcohol abuse Father   . Stroke Father     Medications:   Current Outpatient Medications on File Prior to Visit  Medication Sig Dispense Refill  . amLODipine (NORVASC) 5 MG tablet Take 1 tablet (5 mg total) by mouth daily. 90 tablet 0  . atorvastatin (LIPITOR) 40 MG tablet Take 1 tablet (40 mg total) by mouth daily. 90 tablet 1  . Blood Pressure Monitor DEVI Use to check blood pressure 3 times a week 1 Device 0  . clopidogrel (PLAVIX) 75 MG tablet Take 1 tablet (75 mg total) by mouth daily. 30 tablet 2  . cyanocobalamin 1000 MCG tablet Take 1,000 mcg by mouth daily.    . hydrochlorothiazide (HYDRODIURIL) 25 MG tablet Take 1 tablet (25 mg total) by mouth daily. 90 tablet 0  . lisinopril (ZESTRIL) 20 MG tablet Take 1 tablet (20 mg total) by mouth daily. 30 tablet 2  . UNABLE TO FIND HOLISTER UROSTOMY POUCHES ITEM 8478 100 Device 3   No current facility-administered medications on file prior to visit.    Allergies:   Allergies  Allergen Reactions  . Imodium [Loperamide]     Itchy all over  . Loperamide Hcl Rash      OBJECTIVE:  Physical Exam  Vitals:   09/16/20 1254  BP: (!) 158/68  Pulse: 65  Weight: 157 lb (71.2 kg)  Height: 5\' 8"  (1.727 m)   Body mass index is 23.87 kg/m. No exam data present   General: well developed, well nourished,very pleasant elderly Caucasian female, seated, in no evident distress Head: head normocephalic and atraumatic.   Neck: supple with no carotid or supraclavicular bruits Cardiovascular: regular rate and rhythm, no murmurs Musculoskeletal: no deformity Skin:  no rash/petichiae Vascular:  Normal pulses all  extremities   Neurologic Exam Mental Status: Awake and  fully alert. Fluent speech and language. Oriented to place and time. Recent and remote memory intact. Attention span, concentration and fund of knowledge appropriate. Mood and affect appropriate.  Cranial Nerves: Pupils equal, briskly reactive to light. Extraocular movements full without nystagmus. Visual fields full to confrontation. Hearing intact. Facial sensation intact.  Mild left facial weakness Motor: Normal bulk and tone. Normal strength in all tested extremity muscles Sensory.: intact to touch , pinprick , position and vibratory sensation.  Coordination: Rapid alternating movements normal in all extremities. Finger-to-nose and heel-to-shin performed accurately bilaterally. Gait and Station: Arises from chair without difficulty. Stance is normal. Gait demonstrates normal stride length and imbalance with slight lean towards right. Unable to perform tandem walk.  Romberg negative. Reflexes: 1+ and symmetric. Toes downgoing.          ASSESSMENT/PLAN: Sara Aguirre is a 79 y.o. year old female presented with dizziness and left facial and arm numbness on 04/21/2020 with stroke work-up revealing right PCA (mesial right temporal lobe/hippocampus, medial right temporal lobe, right occipital lobe and callosal splenium) and right thalamic infarcts with right PCA occlusion, embolic secondary to unknown source therefore loop recorder placed to evaluate for atrial fibrillation as possible etiology. Vascular risk factors include HTN, HLD, former tobacco use and advanced age.       1. Right PCA and thalamic stroke:  a. Residual deficits: Altered taste and numbness left fingertips.  Mild imbalance but per patient, chronic.  No residual visual impairment.  She has returned back to all prior activities without difficulty b. continue clopidogrel 75 mg daily  and atorvastatin for secondary stroke prevention.  c. Loop recorder has not shown atrial  fibrillation thus far -continued monitoring by cardiology d. Discussed secondary stroke prevention measures and importance of close PCP follow-up for aggressive stroke risk factor management 2. HTN: BP goal<130/90.  Stable today. On lisinopril, hydrochlorothiazide and amlodipine per PCP 3. HLD: LDL goal<70.  Recent LDL 60 (07/2020) on atorvastatin 40 mg daily per PCP    Follow up in 6 months or call earlier if needed   I spent 25 minutes of face-to-face and non-face-to-face time with patient.  This included previsit chart review, lab review, study review, order entry, electronic health record documentation, patient education regarding stroke, residual deficits, secondary stroke prevention measures and importance of managing stroke risk factors and answered all questions to patient satisfaction    Frann Rider, [facility]  [facility] [address] [place], [place] [address]  Phone [phone] Fax [phone] Note: This document was prepared with digital dictation and possible smart phrase technology. Any transcriptional errors that result from this process are unintentional.

## 2020-09-16 NOTE — Progress Notes (Signed)
I agree with the above plan 

## 2020-09-27 ENCOUNTER — Telehealth: Payer: Self-pay | Admitting: Family Medicine

## 2020-09-27 NOTE — Telephone Encounter (Signed)
LVMTCB to resch appt on 09/29/20 due to provider being out of the office. Pts appt is canceled. This pt can be see by any other provider same day.

## 2020-09-27 NOTE — Telephone Encounter (Signed)
Pt called back at the luch hour. Pt states just reschedule with Dr Carlota Raspberry and she will see appt on her mychart.

## 2020-09-27 NOTE — Telephone Encounter (Signed)
Rescheduled appt for 11/04/20 at 2 pm with Provider Dr. Carlota Raspberry.

## 2020-09-29 ENCOUNTER — Ambulatory Visit: Payer: PPO | Admitting: Family Medicine

## 2020-10-03 ENCOUNTER — Other Ambulatory Visit: Payer: Self-pay | Admitting: Family Medicine

## 2020-10-03 DIAGNOSIS — I10 Essential (primary) hypertension: Secondary | ICD-10-CM

## 2020-10-03 NOTE — Telephone Encounter (Signed)
Requested Prescriptions  Pending Prescriptions Disp Refills   lisinopril (ZESTRIL) 20 MG tablet [Pharmacy Med Name: LISINOPRIL 20 MG TABLET] 90 tablet 0    Sig: TAKE 1 TABLET BY MOUTH EVERY DAY     Cardiovascular:  ACE Inhibitors Failed - 10/03/2020 11:07 AM      Failed - Cr in normal range and within 180 days    Creat  Date Value Ref Range Status  09/29/2015 1.46 (H) 0.60 - 0.93 mg/dL Final   Creatinine, Ser  Date Value Ref Range Status  08/26/2020 1.40 (H) 0.57 - 1.00 mg/dL Final         Failed - Last BP in normal range    BP Readings from Last 1 Encounters:  09/16/20 (!) 158/68         Passed - K in normal range and within 180 days    Potassium  Date Value Ref Range Status  08/26/2020 3.9 3.5 - 5.2 mmol/L Final         Passed - Patient is not pregnant      Passed - Valid encounter within last 6 months    Recent Outpatient Visits          1 month ago Uncontrolled hypertension   [facility], Ranell Patrick, MD   2 months ago Mixed hyperlipidemia   [facility], Ranell Patrick, MD   2 months ago Uncontrolled hypertension   [facility], Ranell Patrick, MD   3 months ago Cerebrovascular accident (CVA), unspecified mechanism [facility])   [facility], Ranell Patrick, MD   4 months ago Cerebrovascular accident (CVA), unspecified mechanism [facility])   [facility], Ranell Patrick, MD      Future Appointments            In 1 month Carlota Raspberry Ranell Patrick, MD [facility] [place], [facility]

## 2020-10-05 LAB — CUP PACEART REMOTE DEVICE CHECK
Date Time Interrogation Session: 20211018230705
Implantable Pulse Generator Implant Date: 20210507

## 2020-10-11 ENCOUNTER — Ambulatory Visit (INDEPENDENT_AMBULATORY_CARE_PROVIDER_SITE_OTHER): Payer: PPO

## 2020-10-11 DIAGNOSIS — I639 Cerebral infarction, unspecified: Secondary | ICD-10-CM | POA: Diagnosis not present

## 2020-10-14 NOTE — Progress Notes (Signed)
[facility] Summary Report / Loop Recorder 

## 2020-10-16 ENCOUNTER — Encounter: Payer: Self-pay | Admitting: Family Medicine

## 2020-10-20 NOTE — Telephone Encounter (Signed)
10/20/2020 - PATIENT DID CALL BACK TO SPEAK WITH KARAC. I EXPLAINED TO HER THAT HE IS TRIAGING PATIENTS FOR DR. Carlota Raspberry TO SEE IN THE OFFICE. I TOLD HER I WOULD PUT A MESSAGE IN FOR HER TO RECEIVE A CALL BACK REGARDING THE QUESTIONS SHE HAS ABOUT THE STATIN DRUG. SHE HAS WRITTEN DR. Carlota Raspberry A DETAILED MY-CHART MESSAGE. BEST PHONE [phone] (CELL) [place]

## 2020-10-22 ENCOUNTER — Encounter: Payer: Self-pay | Admitting: Adult Health

## 2020-11-04 ENCOUNTER — Ambulatory Visit (INDEPENDENT_AMBULATORY_CARE_PROVIDER_SITE_OTHER): Payer: PPO | Admitting: Family Medicine

## 2020-11-04 ENCOUNTER — Other Ambulatory Visit: Payer: Self-pay

## 2020-11-04 ENCOUNTER — Encounter: Payer: Self-pay | Admitting: Family Medicine

## 2020-11-04 VITALS — BP 150/90 | HR 110 | Temp 98.3°F | Ht 68.0 in | Wt 148.0 lb

## 2020-11-04 DIAGNOSIS — I10 Essential (primary) hypertension: Secondary | ICD-10-CM | POA: Diagnosis not present

## 2020-11-04 DIAGNOSIS — D72829 Elevated white blood cell count, unspecified: Secondary | ICD-10-CM

## 2020-11-04 DIAGNOSIS — E782 Mixed hyperlipidemia: Secondary | ICD-10-CM

## 2020-11-04 NOTE — Patient Instructions (Addendum)
  I think you will tolerate a slight higher dose of amlodipine for total dose 7.5mg  per day (take 1 and 1/2 pill of the amlodipine each day). continue hydrochlorothiazide and lisinopril same doses for now.   Return in 6 weeks. If LDL above 70 at that visit, will need to return to full dose of atorvastatin. Can recheck blood pressure at that time as well.   I will recheck your blood count today but if you have any fevers or new symptoms return for recheck prior to the visit in 6 weeks.  If you have lab work done today you will be contacted with your lab results within the next 2 weeks.  If you have not heard from [place] then please contact us. The fastest way to get your results is to register for My Chart.   IF you received an x-ray today, you will receive an invoice from [facility]. Please contact [facility] at [phone] with questions or concerns regarding your invoice.   IF you received labwork today, you will receive an invoice from [facility]. Please contact [facility] at [phone] with questions or concerns regarding your invoice.   Our billing staff will not be able to assist you with questions regarding bills from these companies.  You will be contacted with the lab results as soon as they are available. The fastest way to get your results is to activate your My Chart account. Instructions are located on the last page of this paperwork. If you have not heard from [place] regarding the results in 2 weeks, please contact this office.

## 2020-11-04 NOTE — Progress Notes (Signed)
Subjective:  Patient ID: Sara Aguirre, female    DOB: 1941-05-22  Age: 79 y.o. MRN: [id number]  CC:  Chief Complaint  Patient presents with  . Follow-up    on recent blood work and BP monitor check. Pt has been eatting healthy pt reports not eatting anything that has salt/chestorol in it. pt has brought her BP monitors with her today and a log of home readings. Compaired BP's from pt's 3 monitors with a manual BP.BP readings are as follows 174/104,165/99,and 153/102. Manual BP is 150/90.All BPs check on pt's L arm. Pt reports no physical symptoms of hypertension. Pt wants to gett off of all her medeication in the next 6 months.    HPI Sara Aguirre presents for   Hypertension: Complicated by history of CKD and CVA in May of this year.  Goal blood pressure below 130/90.  Lisinopril 20 mg daily, HCTZ 25 mg daily, amlodipine 5 mg daily.  Has remained on Plavix 75 mg daily.  Variability in readings last visit but with some low home readings I was hesitant to change her medications.  Recommend she monitor her sodium intake, and follow-up with home monitor.  In office reading with her machines as above on various monitors.  Has been trying to eat healthier and avoiding salt. Home readings: still variable - lowest 118/70, 115/78. Other 130-150's/60-80's.  No leg swelling.  BP Readings from Last 3 Encounters:  11/04/20 (!) 150/90  09/16/20 (!) 158/68  08/26/20 (!) 154/89   Lab Results  Component Value Date   CREATININE 1.40 (H) 08/26/2020   Hyperlipidemia: Complicated by CVA as above.  She is on Lipitor 40 mg daily.  LDL at 60, at goal with history of CVA.  She would like to lower her medication if possible. Taking 20mg  per day (1/2 pill), decreased from full pill last week. No new myalgias.  Has been watching diet. Energy better with eating more food.  Lab Results  Component Value Date   CHOL 139 07/26/2020   HDL 55 07/26/2020   LDLCALC 60 07/26/2020   TRIG 140 07/26/2020   CHOLHDL  2.5 07/26/2020   Lab Results  Component Value Date   ALT 12 07/12/2020   AST 19 07/12/2020   ALKPHOS 101 07/12/2020   BILITOT 0.5 07/12/2020   Leukocytosis: No fever, recent illness, night sweats or unexplained weight loss.  Lab Results  Component Value Date   WBC 11.6 (H) 08/26/2020   HGB 14.7 08/26/2020   HCT 43.6 08/26/2020   MCV 91 08/26/2020   PLT 312 08/26/2020    HM: No prior bone density testing -  She declines repeat test at this time.   History Patient Active Problem List   Diagnosis Date Noted  . Acute CVA (cerebrovascular accident) ([place]) 04/21/2020  . Hypokalemia 04/21/2020  . Pure hypercholesterolemia 01/17/2017  . Thyroid nodule 08/20/2013  . HTN (hypertension) 08/20/2013  . Body mass index (BMI) of 24.0-24.9 in adult 08/20/2013  . Gastric ulcer 12/29/2011  . Renal insufficiency 12/29/2011  . History of bladder cancer, s/p cystectomy with ileal conduit    Past Medical History:  Diagnosis Date  . Cancer [facility])    bladder, s/p cystectomy with ileal conduit  . History of bladder cancer   . History of tobacco use   . Hyperlipidemia   . Hypertension   . Incarcerated ventral hernia, s/p lap repair 12/05/2011  . 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 Ulcer, gastric, acute or  chronic    Past Surgical History:  Procedure Laterality Date  . BLADDER REMOVAL  10/1993   Dr. Risa Grill  . HERNIA REPAIR  12/05/11   parastomal  . LOOP RECORDER INSERTION N/A 04/23/2020   Procedure: LOOP RECORDER INSERTION;  Surgeon: Deboraha Sprang, MD;  Location: [facility] [facility];  Service: Cardiovascular;  Laterality: N/A;  . PARASTOMAL HERNIA REPAIR  12/05/2011   Procedure: HERNIA REPAIR PARASTOMAL;  Surgeon: Adin Hector, MD;  Location: [facility];  Service: General;;  Laprascopic lysis of adhestons with reduction and repair with biological mesh for incarcerated parastomal and ventral long incisional hernia.  Marland Kitchen REVISION UROSTOMY CUTANEOUS    .  VENTRAL HERNIA REPAIR  12/05/2011   Procedure: HERNIA REPAIR VENTRAL ADULT;  Surgeon: Adin Hector, MD;  Location: [facility];  Service: General;;   Allergies  Allergen Reactions  . Imodium [Loperamide]     Itchy all over  . Loperamide Hcl Rash   Prior to Admission medications   Medication Sig Start Date End Date Taking? Authorizing Provider  amLODipine (NORVASC) 5 MG tablet Take 1 tablet (5 mg total) by mouth daily. 07/12/20  Yes Wendie Agreste, MD  atorvastatin (LIPITOR) 40 MG tablet Take 1 tablet (40 mg total) by mouth daily. 07/12/20  Yes Wendie Agreste, MD  Blood Pressure Monitor DEVI Use to check blood pressure 3 times a week 10/27/19  Yes Stallings, Zoe A, MD  clopidogrel (PLAVIX) 75 MG tablet Take 1 tablet (75 mg total) by mouth daily. 08/26/20  Yes Wendie Agreste, MD  cyanocobalamin 1000 MCG tablet Take 1,000 mcg by mouth daily.   Yes [provider]  FLUZONE HIGH-DOSE QUADRIVALENT 0.7 ML SUSY  08/22/20  Yes [provider]  hydrochlorothiazide (HYDRODIURIL) 25 MG tablet Take 1 tablet (25 mg total) by mouth daily. 07/12/20  Yes Wendie Agreste, MD  lisinopril (ZESTRIL) 20 MG tablet TAKE 1 TABLET BY MOUTH EVERY DAY 10/03/20  Yes Wendie Agreste, MD  UNABLE TO FIND HOLISTER UROSTOMY POUCHES ITEM [phone] 10/31/18  Yes Forrest Moron, MD   Social History   Socioeconomic History  . Marital status: Widowed    Spouse name: Not on file  . Number of children: 2  . Years of education: Not on file  . Highest education level: Some college, no degree  Occupational History  . Occupation: retired  Tobacco Use  . Smoking status: Former Smoker    Quit date: 01/02/1993    Years since quitting: 27.8  . Smokeless tobacco: Never Used  Vaping Use  . Vaping Use: Never used  Substance and Sexual Activity  . Alcohol use: No  . Drug use: No  . Sexual activity: Not on file  Other Topics Concern  . 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  Gardening      ADLs: drives; independent with ADLs; has garden      Advanced Directives: YES: full code but no prolonged measures; HCPOA: Orvis Brill or other son Cory Munch; copy  on chart 04/2017.   Social Determinants of Health   Financial Resource Strain:   . Difficulty of Paying Living Expenses: Not on file  Food Insecurity:   . Worried About Charity fundraiser in the Last Year: Not on file  . Ran Out of Food in the Last Year: Not on file  Transportation Needs:   . Lack of Transportation (Medical): Not on file  . Lack of Transportation (Non-Medical): Not on file  Physical Activity:   . Days of Exercise per Week: Not on file  . Minutes of Exercise per Session: Not on file  Stress:   . Feeling of Stress : Not on file  Social Connections:   . Frequency of Communication with Friends and Family: Not on file  . Frequency of Social Gatherings with Friends and Family: Not on file  . Attends Religious Services: Not on file  . Active Member of Clubs or Organizations: Not on file  . Attends Archivist Meetings: Not on file  . Marital Status: Not on file  Intimate Partner Violence:   . Fear of Current or Ex-Partner: Not on file  . Emotionally Abused: Not on file  . Physically Abused: Not on file  . Sexually Abused: Not on file    Review of Systems  Constitutional: Negative for fatigue and unexpected weight change.  Respiratory: Negative for chest tightness and shortness of breath.   Cardiovascular: Negative for chest pain, palpitations and leg swelling.  Gastrointestinal: Negative for abdominal pain and blood in stool.  Neurological: Negative for  dizziness, syncope, light-headedness and headaches.     Objective:   Vitals:   11/04/20 1332 11/04/20 1339  BP: (!) 171/107 (!) 150/90  Pulse: (!) 110   Temp: 98.3 F (36.8 C)   TempSrc: Temporal   SpO2: 95%   Weight: 148 lb (67.1 kg)   Height: 5\' 8"  (1.727 m)      Physical Exam Vitals reviewed.  Constitutional:      Appearance: She is well-developed.  HENT:     Head: Normocephalic and atraumatic.  Eyes:     Conjunctiva/sclera: Conjunctivae normal.     Pupils: Pupils are equal, round, and reactive to light.  Neck:     Vascular: No carotid bruit.  Cardiovascular:     Rate and Rhythm: Normal rate and regular rhythm.     Heart sounds: Normal heart sounds.  Pulmonary:     Effort: Pulmonary effort is normal.     Breath sounds: Normal breath sounds.  Abdominal:     Palpations: Abdomen is soft. There is no pulsatile mass.     Tenderness: There is no abdominal tenderness.  Skin:    General: Skin is warm and dry.  Neurological:     Mental Status: She is alert and oriented to person, place, and time.  Psychiatric:        Behavior: Behavior normal.        Assessment & Plan:  AUBRYNN KATONA is a 79 y.o. female . Leukocytosis, unspecified type - Plan: CBC  -Denies symptoms or previous illness.  Borderline leukocytosis on previous labs.  Repeat testing today.  RTC precautions  Uncontrolled hypertension  -Some variability in readings but still above goal as most are above 544 systolic.  We will try to increase amlodipine to 7.5 mg total dose per day, continue same dose lisinopril, HCTZ, recheck 6 weeks. Orthostatic precautions.  Mixed hyperlipidemia  -Tolerated full 40 mg Lipitor, but she would like to try decreased dosing  and has done so on her own in the past week at 1/2 pill/day.  We will follow up closely in 6 weeks with repeat fasting labs but stressed importance of goal LDL less than 70 with previous CVA.  Understanding expressed.  No orders of the defined types  were placed in this encounter.  Patient Instructions    I think you will tolerate a slight higher dose of amlodipine for total dose 7.5mg  per day (take 1 and 1/2 pill of the amlodipine each day). continue hydrochlorothiazide and lisinopril same doses for now.   Return in 6 weeks. If LDL above 70 at that visit, will need to return to full dose of atorvastatin. Can recheck blood pressure at that time as well.   I will recheck your blood count today but if you have any fevers or new symptoms return for recheck prior to the visit in 6 weeks.  If you have lab work done today you will be contacted with your lab results within the next 2 weeks.  If you have not heard from [place] then please contact us. The fastest way to get your results is to register for My Chart.   IF you received an x-ray today, you will receive an invoice from [facility]. Please contact [facility] at [phone] with questions or concerns regarding your invoice.   IF you received labwork today, you will receive an invoice from [place]. Please contact [facility] at [phone] with questions or concerns regarding your invoice.   Our billing staff will not be able to assist you with questions regarding bills from these companies.  You will be contacted with the lab results as soon as they are available. The fastest way to get your results is to activate your My Chart account. Instructions are located on the last page of this paperwork. If you have not heard from [place] regarding the results in 2 weeks, please contact this office.         Signed, Merri Ray, MD [facility]

## 2020-11-05 LAB — CBC
Hematocrit: 42.8 % (ref 34.0–46.6)
Hemoglobin: 13.8 g/dL (ref 11.1–15.9)
MCH: 29.7 pg (ref 26.6–33.0)
MCHC: 32.2 g/dL (ref 31.5–35.7)
MCV: 92 fL (ref 79–97)
Platelets: 313 10*3/uL (ref 150–450)
RBC: 4.65 x10E6/uL (ref 3.77–5.28)
RDW: 12.2 % (ref 11.7–15.4)
WBC: 8.2 10*3/uL (ref 3.4–10.8)

## 2020-11-12 LAB — CUP PACEART REMOTE DEVICE CHECK
Date Time Interrogation Session: 20211120230152
Implantable Pulse Generator Implant Date: 20210507

## 2020-11-13 ENCOUNTER — Other Ambulatory Visit: Payer: Self-pay | Admitting: Family Medicine

## 2020-11-13 ENCOUNTER — Encounter: Payer: Self-pay | Admitting: Family Medicine

## 2020-11-13 DIAGNOSIS — I10 Essential (primary) hypertension: Secondary | ICD-10-CM

## 2020-11-13 NOTE — Telephone Encounter (Signed)
Requested Prescriptions  Pending Prescriptions Disp Refills  . amLODipine (NORVASC) 5 MG tablet [Pharmacy Med Name: AMLODIPINE BESYLATE 5 MG TAB] 90 tablet 0    Sig: TAKE 1 TABLET BY MOUTH EVERY DAY     Cardiovascular:  Calcium Channel Blockers Failed - 11/13/2020  9:09 AM      Failed - Last BP in normal range    BP Readings from Last 1 Encounters:  11/04/20 (!) 150/90         Passed - Valid encounter within last 6 months    Recent Outpatient Visits          1 week ago Leukocytosis, unspecified type   [facility] [facility], Ranell Patrick, MD   2 months ago Uncontrolled hypertension   [facility] [place] [facility], Ranell Patrick, MD   3 months ago Mixed hyperlipidemia   [facility] [place] [facility], Ranell Patrick, MD   4 months ago Uncontrolled hypertension   [facility] [place] [facility], Ranell Patrick, MD   4 months ago Cerebrovascular accident (CVA), unspecified mechanism [facility])   [facility] [place], Ranell Patrick, MD      Future Appointments            In 1 month Carlota Raspberry Ranell Patrick, MD [facility] [place], [facility]           . hydrochlorothiazide (HYDRODIURIL) 25 MG tablet [Pharmacy Med Name: HYDROCHLOROTHIAZIDE 25 MG TAB] 90 tablet 0    Sig: TAKE 1 TABLET BY MOUTH EVERY DAY     Cardiovascular: Diuretics - Thiazide Failed - 11/13/2020  9:09 AM      Failed - Cr in normal range and within 360 days    Creat  Date Value Ref Range Status  09/29/2015 1.46 (H) 0.60 - 0.93 mg/dL Final   Creatinine, Ser  Date Value Ref Range Status  08/26/2020 1.40 (H) 0.57 - 1.00 mg/dL Final         Failed - Last BP in normal range    BP Readings from Last 1 Encounters:  11/04/20 (!) 150/90         Passed - Ca in normal range and within 360 days    Calcium  Date Value Ref Range Status  08/26/2020 9.4 8.7 - 10.3 mg/dL Final   Calcium, Ion  Date Value Ref Range Status  04/21/2020 1.19 1.15 - 1.40 mmol/L Final         Passed - K in normal range and within 360 days    Potassium   Date Value Ref Range Status  08/26/2020 3.9 3.5 - 5.2 mmol/L Final         Passed - Na in normal range and within 360 days    Sodium  Date Value Ref Range Status  08/26/2020 142 134 - 144 mmol/L Final         Passed - Valid encounter within last 6 months    Recent Outpatient Visits          1 week ago Leukocytosis, unspecified type   [facility] [place] [facility], Ranell Patrick, MD   2 months ago Uncontrolled hypertension   [facility] [place] [facility], Ranell Patrick, MD   3 months ago Mixed hyperlipidemia   [facility] [place] [facility], Ranell Patrick, MD   4 months ago Uncontrolled hypertension   [facility] [place], MD   4 months ago Cerebrovascular accident (CVA), unspecified mechanism [facility])   [facility] [place] [facility],  Ranell Patrick, MD      Future Appointments            In 1 month Carlota Raspberry Ranell Patrick, MD [facility] [place], [facility]

## 2020-12-20 ENCOUNTER — Ambulatory Visit (INDEPENDENT_AMBULATORY_CARE_PROVIDER_SITE_OTHER): Payer: PPO

## 2020-12-20 DIAGNOSIS — I639 Cerebral infarction, unspecified: Secondary | ICD-10-CM

## 2020-12-21 DIAGNOSIS — Z936 Other artificial openings of urinary tract status: Secondary | ICD-10-CM | POA: Diagnosis not present

## 2020-12-21 LAB — CUP PACEART REMOTE DEVICE CHECK
Date Time Interrogation Session: 20220101230300
Implantable Pulse Generator Implant Date: 20210507

## 2020-12-23 ENCOUNTER — Encounter: Payer: Self-pay | Admitting: Family Medicine

## 2020-12-23 ENCOUNTER — Ambulatory Visit (INDEPENDENT_AMBULATORY_CARE_PROVIDER_SITE_OTHER): Payer: PPO | Admitting: Family Medicine

## 2020-12-23 ENCOUNTER — Other Ambulatory Visit: Payer: Self-pay

## 2020-12-23 VITALS — BP 136/74 | HR 69 | Temp 97.8°F | Ht 68.0 in | Wt 143.0 lb

## 2020-12-23 DIAGNOSIS — I10 Essential (primary) hypertension: Secondary | ICD-10-CM | POA: Diagnosis not present

## 2020-12-23 DIAGNOSIS — E782 Mixed hyperlipidemia: Secondary | ICD-10-CM | POA: Diagnosis not present

## 2020-12-23 DIAGNOSIS — I6930 Unspecified sequelae of cerebral infarction: Secondary | ICD-10-CM

## 2020-12-23 DIAGNOSIS — R7303 Prediabetes: Secondary | ICD-10-CM

## 2020-12-23 DIAGNOSIS — I639 Cerebral infarction, unspecified: Secondary | ICD-10-CM

## 2020-12-23 MED ORDER — CLOPIDOGREL BISULFATE 75 MG PO TABS: 75.0000 mg | ORAL_TABLET | Freq: Every day | ORAL | 2 refills | Status: DC

## 2020-12-23 MED ORDER — ATORVASTATIN CALCIUM 40 MG PO TABS: 20.0000 mg | ORAL_TABLET | Freq: Every day | ORAL | 1 refills | Status: DC

## 2020-12-23 MED ORDER — AMLODIPINE BESYLATE 5 MG PO TABS: 7.5000 mg | ORAL_TABLET | Freq: Every day | ORAL | 1 refills | Status: DC

## 2020-12-23 MED ORDER — HYDROCHLOROTHIAZIDE 25 MG PO TABS: 25.0000 mg | ORAL_TABLET | Freq: Every day | ORAL | 1 refills | Status: DC

## 2020-12-23 MED ORDER — LISINOPRIL 20 MG PO TABS: 20.0000 mg | ORAL_TABLET | Freq: Every day | ORAL | 1 refills | Status: DC

## 2020-12-23 NOTE — Patient Instructions (Addendum)
Keep up the good work with watching diet.  Blood pressure looks better today, continue same medicines for now.  I will check your labs including cholesterol at the lower dose of the statin medicine but no changes for now.  Follow-up with me in 6 months but please let me know if there are questions sooner.    If you have lab work done today you will be contacted with your lab results within the next 2 weeks.  If you have not heard from [place] then please contact us. The fastest way to get your results is to register for My Chart.   IF you received an x-ray today, you will receive an invoice from [facility]. Please contact [facility] at [phone] with questions or concerns regarding your invoice.   IF you received labwork today, you will receive an invoice from [place]. Please contact [facility] at [phone] with questions or concerns regarding your invoice.   Our billing staff will not be able to assist you with questions regarding bills from these companies.  You will be contacted with the lab results as soon as they are available. The fastest way to get your results is to activate your My Chart account. Instructions are located on the last page of this paperwork. If you have not heard from [place] regarding the results in 2 weeks, please contact this office.

## 2020-12-23 NOTE — Progress Notes (Signed)
Subjective:  Patient ID: Sara Aguirre, female    DOB: 1941-07-13  Age: 80 y.o. MRN: [id number]  CC:  Chief Complaint  Patient presents with  . Follow-up    On hypertension and hyperlipidemia. Pt reports no issues with BP since last OV. Pt states she noticed if she takes her BP medication at 5 pm when she checks her BP she will get a good reading.Pt reports no physical symptoms of these conditions since last OV. Pt states she is still taking 1/2 a tab of the Lipitor as instructed by the provider.    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  Amlodipine was increased in November to a total of 7.5 mg daily.  Continue on lisinopril 20 mg daily, hydrochlorothiazide 25 mg daily.  She is on Plavix 75 mg. No new side effects on current meds. Taking meds in evening now - feels like work better now.  Improving diet. Avoiding dairy, animal products. Low sodium broth. Airfryer. Is obtaining plant protein.  Home readings: 120-140/60-80.  covid vaccine - considering getting booster - recommended.  BP Readings from Last 3 Encounters:  12/23/20 136/74  11/04/20 (!) 150/90  09/16/20 (!) 158/68   Lab Results  Component Value Date   CREATININE 1.40 (H) 08/26/2020   Hyperlipidemia: Lipitor 40 mg, decreased to 1/2 pill daily prior to last visit.  Has been watching diet and eating healthier. No new myalgias.  Fasting today. Min creamer in coffee.  Lab Results  Component Value Date   CHOL 139 07/26/2020   HDL 55 07/26/2020   LDLCALC 60 07/26/2020   TRIG 140 07/26/2020   CHOLHDL 2.5 07/26/2020   Lab Results  Component Value Date   ALT 12 07/12/2020   AST 19 07/12/2020   ALKPHOS 101 07/12/2020   BILITOT 0.5 07/12/2020    Hyperglycemia/prediabetes: Borderline few times on electrolytes, 100 at September visit. Diet improvements as above.  Lab Results  Component Value Date   HGBA1C 5.9 (H) 04/22/2020     History Patient Active Problem List   Diagnosis Date Noted  . Acute CVA (cerebrovascular accident) ([place]) 04/21/2020  . Hypokalemia 04/21/2020  . Pure hypercholesterolemia 01/17/2017  . Thyroid nodule 08/20/2013  . HTN (hypertension) 08/20/2013  . Body mass index (BMI) of 24.0-24.9 in adult 08/20/2013  . Gastric ulcer 12/29/2011  . Renal insufficiency 12/29/2011  . History of bladder cancer, s/p cystectomy with ileal conduit    Past Medical History:  Diagnosis Date  . Cancer [facility])    bladder, s/p cystectomy with ileal conduit  . History of bladder cancer   . History of tobacco use   . Hyperlipidemia   . Hypertension   . Incarcerated ventral hernia, s/p lap repair 12/05/2011  . 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 Ulcer, gastric, acute or chronic    Past Surgical History:  Procedure Laterality Date  . BLADDER REMOVAL  10/1993   Dr. Risa Grill  . HERNIA REPAIR  12/05/11   parastomal  . LOOP RECORDER INSERTION N/A 04/23/2020   Procedure: LOOP RECORDER INSERTION;  Surgeon: Deboraha Sprang, MD;  Location: [place] [facility];  Service: Cardiovascular;  Laterality: N/A;  . PARASTOMAL HERNIA REPAIR  12/05/2011   Procedure: HERNIA REPAIR PARASTOMAL;  Surgeon: Adin Hector, MD;  Location: [facility];  Service: General;;  Laprascopic lysis of adhestons with reduction and repair with biological  mesh for incarcerated parastomal and ventral long incisional hernia.  Marland Kitchen REVISION UROSTOMY CUTANEOUS    . VENTRAL HERNIA REPAIR  12/05/2011   Procedure: HERNIA REPAIR VENTRAL ADULT;  Surgeon: Adin Hector, MD;  Location: [facility];  Service: General;;   Allergies  Allergen Reactions  . Imodium [Loperamide]     Itchy all over  . Loperamide Hcl Rash   Prior to Admission medications   Medication Sig Start Date End Date Taking? Authorizing Provider  amLODipine (NORVASC) 5 MG tablet TAKE 1 TABLET BY MOUTH EVERY DAY 11/13/20  Yes Wendie Agreste, MD   atorvastatin (LIPITOR) 40 MG tablet Take 1 tablet (40 mg total) by mouth daily. 07/12/20  Yes Wendie Agreste, MD  Blood Pressure Monitor DEVI Use to check blood pressure 3 times a week 10/27/19  Yes Stallings, Zoe A, MD  clopidogrel (PLAVIX) 75 MG tablet Take 1 tablet (75 mg total) by mouth daily. 08/26/20  Yes Wendie Agreste, MD  cyanocobalamin 1000 MCG tablet Take 1,000 mcg by mouth daily.   Yes [provider]  FLUZONE HIGH-DOSE QUADRIVALENT 0.7 ML SUSY  08/22/20  Yes [provider]  hydrochlorothiazide (HYDRODIURIL) 25 MG tablet TAKE 1 TABLET BY MOUTH EVERY DAY 11/13/20  Yes Wendie Agreste, MD  lisinopril (ZESTRIL) 20 MG tablet TAKE 1 TABLET BY MOUTH EVERY DAY 10/03/20  Yes Wendie Agreste, MD  UNABLE TO FIND Marcy Siren ITEM [phone] 10/31/18  Yes Forrest Moron, MD   Social History   Socioeconomic History  . Marital status: Widowed    Spouse name: Not on file  . Number of children: 2  . Years of education: Not on file  . Highest education level: Some college, no degree  Occupational History  . Occupation: retired  Tobacco Use  . Smoking status: Former Smoker    Quit date: 01/02/1993    Years since quitting: 27.9  . Smokeless tobacco: Never Used  Vaping Use  . Vaping Use: Never used  Substance and Sexual Activity  . Alcohol use: No  . Drug use: No  . Sexual activity: Not on file  Other Topics Concern  . 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  Gardening      ADLs: drives; independent with ADLs; has garden      Advanced Directives: YES: full code but no prolonged measures; HCPOA: Orvis Brill or other son Cory Munch; copy on chart 04/2017.   Social Determinants of Health   Financial Resource Strain: Not on file  Food Insecurity: Not on file  Transportation Needs: Not on file  Physical Activity: Not on file  Stress: Not on file  Social Connections: Not on file  Intimate Partner Violence: Not on file    Review of Systems  Constitutional: Negative for fatigue and unexpected weight change.  Respiratory: Negative for chest tightness and shortness of breath.   Cardiovascular: Negative for chest pain, palpitations and leg swelling.  Gastrointestinal: Negative for abdominal pain and blood in stool.  Neurological: Negative  for dizziness, syncope, weakness, light-headedness and headaches.     Objective:   Vitals:   12/23/20 0853 12/23/20 0855  BP: (!) 172/80 136/74  Pulse: 69   Temp: 97.8 F (36.6 C)   TempSrc: Temporal   SpO2: (!) 89%   Weight: 143 lb (64.9 kg)   Height: 5' 8"  (1.727 m)     Physical Exam Vitals reviewed.  Constitutional:      Appearance: She is well-developed and well-nourished.  HENT:     Head: Normocephalic and atraumatic.  Eyes:     Extraocular Movements: EOM normal.     Conjunctiva/sclera: Conjunctivae normal.     Pupils: Pupils are equal, round, and reactive to light.  Neck:     Vascular: No carotid bruit.  Cardiovascular:     Rate and Rhythm: Normal rate and regular rhythm.     Pulses: Intact distal pulses.     Heart sounds: Normal heart sounds.  Pulmonary:     Effort: Pulmonary effort is normal.     Breath sounds: Normal breath sounds.  Abdominal:     Palpations: Abdomen is soft. There is no pulsatile mass.     Tenderness: There is no abdominal tenderness.  Skin:    General: Skin is warm and dry.     Comments: Cap refill 1s at fingertips.   Neurological:     Mental Status: She is alert and oriented to person, place, and time.  Psychiatric:        Mood and Affect: Mood and affect normal.        Behavior:  Behavior normal.     Assessment & Plan:  KECIA SWOBODA is a 80 y.o. female . Prediabetes  -Check A1c.  Commended on diet change.  Uncontrolled hypertension  -Now improved with additional amlodipine, tolerating current regimen.  Continue same.  Mixed hyperlipidemia  -Tolerating half dosing of Lipitor, 20 mg daily.  Check labs.  Goal LDL less than 70 with history of CVA.  Cerebrovascular accident (CVA), unspecified mechanism ([place])  -Continue statin, BP goal 130/80, LDL goal less than 70.  Continue Plavix.  Meds ordered this encounter  Medications  . amLODipine (NORVASC) 5 MG tablet    Sig: Take 1.5 tablets (7.5 mg total) by mouth daily.    Dispense:  135 tablet    Refill:  1  . atorvastatin (LIPITOR) 40 MG tablet    Sig: Take 0.5 tablets (20 mg total) by mouth daily.    Dispense:  45 tablet    Refill:  1  . lisinopril (ZESTRIL) 20 MG tablet    Sig: Take 1 tablet (20 mg total) by mouth daily.    Dispense:  90 tablet    Refill:  1  . clopidogrel (PLAVIX) 75 MG tablet    Sig: Take 1 tablet (75 mg total) by mouth daily.    Dispense:  90 tablet    Refill:  2  . hydrochlorothiazide (HYDRODIURIL) 25 MG tablet    Sig: Take 1 tablet (25 mg total) by mouth daily.    Dispense:  90 tablet    Refill:  1   Patient Instructions   Keep up the good work with watching diet.  Blood pressure looks better today, continue same medicines for now.  I will check your labs including cholesterol at the lower dose of the statin medicine but no changes for now.  Follow-up with me in 6 months but please let me know if there are questions sooner.    If you  have lab work done today you will be contacted with your lab results within the next 2 weeks.  If you have not heard from [place] then please contact us. The fastest way to get your results is to register for My Chart.   IF you received an x-ray today, you will receive an invoice from [facility]. Please contact [facility] at  [phone] with questions or concerns regarding your invoice.   IF you received labwork today, you will receive an invoice from [place]. Please contact [facility] at [phone] with questions or concerns regarding your invoice.   Our billing staff will not be able to assist you with questions regarding bills from these companies.  You will be contacted with the lab results as soon as they are available. The fastest way to get your results is to activate your My Chart account. Instructions are located on the last page of this paperwork. If you have not heard from [place] regarding the results in 2 weeks, please contact this office.          Signed, Merri Ray, MD [facility]

## 2020-12-24 LAB — COMPREHENSIVE METABOLIC PANEL
ALT: 7 IU/L (ref 0–32)
AST: 16 IU/L (ref 0–40)
Albumin/Globulin Ratio: 1.3 (ref 1.2–2.2)
Albumin: 3.9 g/dL (ref 3.6–4.6)
Alkaline Phosphatase: 93 IU/L (ref 44–121)
BUN/Creatinine Ratio: 13 (ref 12–28)
BUN: 18 mg/dL (ref 8–27)
Bilirubin Total: 0.4 mg/dL (ref 0.0–1.2)
CO2: 26 mmol/L (ref 20–29)
Calcium: 9.8 mg/dL (ref 8.7–10.3)
Chloride: 97 mmol/L (ref 96–106)
Creatinine, Ser: 1.44 mg/dL — ABNORMAL HIGH (ref 0.57–1.00)
GFR calc Af Amer: 39 mL/min/{1.73_m2} — ABNORMAL LOW (ref >59)
GFR calc non Af Amer: 34 mL/min/{1.73_m2} — ABNORMAL LOW (ref >59)
Globulin, Total: 3 g/dL (ref 1.5–4.5)
Glucose: 111 mg/dL — ABNORMAL HIGH (ref 65–99)
Potassium: 4 mmol/L (ref 3.5–5.2)
Sodium: 140 mmol/L (ref 134–144)
Total Protein: 6.9 g/dL (ref 6.0–8.5)

## 2020-12-24 LAB — LIPID PANEL
Chol/HDL Ratio: 2.8 ratio (ref 0.0–4.4)
Cholesterol, Total: 168 mg/dL (ref 100–199)
HDL: 60 mg/dL (ref >39)
LDL Chol Calc (NIH): 85 mg/dL (ref 0–99)
Triglycerides: 135 mg/dL (ref 0–149)
VLDL Cholesterol Cal: 23 mg/dL (ref 5–40)

## 2020-12-24 LAB — HEMOGLOBIN A1C
Est. average glucose Bld gHb Est-mCnc: 117 mg/dL
Hgb A1c MFr Bld: 5.7 % — ABNORMAL HIGH (ref 4.8–5.6)

## 2021-01-03 ENCOUNTER — Encounter: Payer: Self-pay | Admitting: Family Medicine

## 2021-01-04 NOTE — Progress Notes (Signed)
[facility] Summary Report / Loop Recorder 

## 2021-01-21 LAB — CUP PACEART REMOTE DEVICE CHECK
Date Time Interrogation Session: 20220203230548
Implantable Pulse Generator Implant Date: 20210507

## 2021-01-24 ENCOUNTER — Ambulatory Visit (INDEPENDENT_AMBULATORY_CARE_PROVIDER_SITE_OTHER): Payer: PPO

## 2021-01-24 DIAGNOSIS — I639 Cerebral infarction, unspecified: Secondary | ICD-10-CM | POA: Diagnosis not present

## 2021-01-31 NOTE — Progress Notes (Signed)
[facility] Summary Report / Loop Recorder 

## 2021-02-28 ENCOUNTER — Ambulatory Visit (INDEPENDENT_AMBULATORY_CARE_PROVIDER_SITE_OTHER): Payer: PPO

## 2021-02-28 DIAGNOSIS — I639 Cerebral infarction, unspecified: Secondary | ICD-10-CM

## 2021-02-28 LAB — CUP PACEART REMOTE DEVICE CHECK
Date Time Interrogation Session: 20220308230332
Implantable Pulse Generator Implant Date: 20210507

## 2021-03-07 NOTE — Progress Notes (Signed)
[facility] Summary Report / Loop Recorder 

## 2021-03-16 ENCOUNTER — Ambulatory Visit: Payer: PPO | Admitting: Adult Health

## 2021-03-28 ENCOUNTER — Ambulatory Visit (INDEPENDENT_AMBULATORY_CARE_PROVIDER_SITE_OTHER): Payer: PPO

## 2021-03-28 DIAGNOSIS — I639 Cerebral infarction, unspecified: Secondary | ICD-10-CM | POA: Diagnosis not present

## 2021-03-30 LAB — CUP PACEART REMOTE DEVICE CHECK
Date Time Interrogation Session: 20220410230216
Implantable Pulse Generator Implant Date: 20210507

## 2021-04-12 NOTE — Progress Notes (Signed)
[facility] Summary Report / Loop Recorder 

## 2021-05-23 ENCOUNTER — Encounter: Payer: Self-pay | Admitting: Family Medicine

## 2021-05-24 DIAGNOSIS — Z936 Other artificial openings of urinary tract status: Secondary | ICD-10-CM | POA: Diagnosis not present

## 2021-06-06 ENCOUNTER — Ambulatory Visit (INDEPENDENT_AMBULATORY_CARE_PROVIDER_SITE_OTHER): Payer: PPO

## 2021-06-06 DIAGNOSIS — I639 Cerebral infarction, unspecified: Secondary | ICD-10-CM

## 2021-06-06 LAB — CUP PACEART REMOTE DEVICE CHECK
Date Time Interrogation Session: 20220615230440
Implantable Pulse Generator Implant Date: 20210507

## 2021-06-22 ENCOUNTER — Ambulatory Visit: Payer: Self-pay | Admitting: Family Medicine

## 2021-06-24 NOTE — Progress Notes (Signed)
[facility] Summary Report / Loop Recorder 

## 2021-07-07 LAB — CUP PACEART REMOTE DEVICE CHECK
Date Time Interrogation Session: 20220718230813
Implantable Pulse Generator Implant Date: 20210507

## 2021-07-11 ENCOUNTER — Ambulatory Visit (INDEPENDENT_AMBULATORY_CARE_PROVIDER_SITE_OTHER): Payer: PPO

## 2021-07-11 DIAGNOSIS — I639 Cerebral infarction, unspecified: Secondary | ICD-10-CM

## 2021-07-18 ENCOUNTER — Encounter: Payer: Self-pay | Admitting: Family Medicine

## 2021-07-18 ENCOUNTER — Telehealth: Payer: Self-pay | Admitting: Family Medicine

## 2021-07-18 NOTE — Telephone Encounter (Signed)
Mr. Elta Guadeloupe called in wanted to know if Anda Kraft would take his mom Sara Aguirre as a patient due to she has moved in with him and [employer] is a ways away now for them.

## 2021-07-19 NOTE — Telephone Encounter (Signed)
Yes, I am happy to take Mr. Rayvon Char mother, Ms. Mosley.  Please set up for TOC.

## 2021-07-19 NOTE — Telephone Encounter (Signed)
I understand. Elida with me.

## 2021-07-24 ENCOUNTER — Encounter: Payer: Self-pay | Admitting: Adult Health

## 2021-07-24 ENCOUNTER — Encounter: Payer: Self-pay | Admitting: Family Medicine

## 2021-07-25 NOTE — Telephone Encounter (Signed)
LVMTCB

## 2021-08-04 NOTE — Progress Notes (Signed)
[facility] Summary Report / Loop Recorder 

## 2021-08-11 ENCOUNTER — Ambulatory Visit (INDEPENDENT_AMBULATORY_CARE_PROVIDER_SITE_OTHER): Payer: PPO | Admitting: Primary Care

## 2021-08-11 ENCOUNTER — Other Ambulatory Visit: Payer: Self-pay

## 2021-08-11 ENCOUNTER — Telehealth: Payer: Self-pay

## 2021-08-11 VITALS — BP 155/80 | HR 80 | Temp 98.1°F | Ht 68.0 in | Wt 143.0 lb

## 2021-08-11 DIAGNOSIS — Z8551 Personal history of malignant neoplasm of bladder: Secondary | ICD-10-CM | POA: Diagnosis not present

## 2021-08-11 DIAGNOSIS — E7800 Pure hypercholesterolemia, unspecified: Secondary | ICD-10-CM

## 2021-08-11 DIAGNOSIS — N289 Disorder of kidney and ureter, unspecified: Secondary | ICD-10-CM | POA: Diagnosis not present

## 2021-08-11 DIAGNOSIS — I10 Essential (primary) hypertension: Secondary | ICD-10-CM

## 2021-08-11 DIAGNOSIS — E041 Nontoxic single thyroid nodule: Secondary | ICD-10-CM | POA: Diagnosis not present

## 2021-08-11 DIAGNOSIS — Z8673 Personal history of transient ischemic attack (TIA), and cerebral infarction without residual deficits: Secondary | ICD-10-CM

## 2021-08-11 DIAGNOSIS — I639 Cerebral infarction, unspecified: Secondary | ICD-10-CM

## 2021-08-11 DIAGNOSIS — E782 Mixed hyperlipidemia: Secondary | ICD-10-CM

## 2021-08-11 DIAGNOSIS — R7303 Prediabetes: Secondary | ICD-10-CM | POA: Diagnosis not present

## 2021-08-11 LAB — CBC
HCT: 40.5 % (ref 36.0–46.0)
Hemoglobin: 13.3 g/dL (ref 12.0–15.0)
MCHC: 32.8 g/dL (ref 30.0–36.0)
MCV: 90.5 fl (ref 78.0–100.0)
Platelets: 267 10*3/uL (ref 150.0–400.0)
RBC: 4.47 Mil/uL (ref 3.87–5.11)
RDW: 13.4 % (ref 11.5–15.5)
WBC: 9 10*3/uL (ref 4.0–10.5)

## 2021-08-11 LAB — COMPREHENSIVE METABOLIC PANEL
ALT: 17 U/L (ref 0–35)
AST: 24 U/L (ref 0–37)
Albumin: 3.9 g/dL (ref 3.5–5.2)
Alkaline Phosphatase: 68 U/L (ref 39–117)
BUN: 22 mg/dL (ref 6–23)
CO2: 30 mEq/L (ref 19–32)
Calcium: 9.6 mg/dL (ref 8.4–10.5)
Chloride: 101 mEq/L (ref 96–112)
Creatinine, Ser: 1.81 mg/dL — ABNORMAL HIGH (ref 0.40–1.20)
GFR: 25.73 mL/min — ABNORMAL LOW (ref >60.00)
Glucose, Bld: 91 mg/dL (ref 70–99)
Potassium: 4.5 mEq/L (ref 3.5–5.1)
Sodium: 138 mEq/L (ref 135–145)
Total Bilirubin: 0.4 mg/dL (ref 0.2–1.2)
Total Protein: 7.2 g/dL (ref 6.0–8.3)

## 2021-08-11 LAB — LIPID PANEL
Cholesterol: 229 mg/dL — ABNORMAL HIGH (ref 0–200)
HDL: 65.1 mg/dL (ref >39.00)
LDL Cholesterol: 128 mg/dL — ABNORMAL HIGH (ref 0–99)
NonHDL: 163.67
Total CHOL/HDL Ratio: 4
Triglycerides: 178 mg/dL — ABNORMAL HIGH (ref 0.0–149.0)
VLDL: 35.6 mg/dL (ref 0.0–40.0)

## 2021-08-11 LAB — HEMOGLOBIN A1C: Hgb A1c MFr Bld: 5.6 % (ref 4.6–6.5)

## 2021-08-11 MED ORDER — AMLODIPINE BESYLATE 2.5 MG PO TABS: 2.5000 mg | ORAL_TABLET | Freq: Every day | ORAL | 3 refills | Status: DC

## 2021-08-11 MED ORDER — LISINOPRIL-HYDROCHLOROTHIAZIDE 20-25 MG PO TABS: 1.0000 | ORAL_TABLET | Freq: Every day | ORAL | 3 refills | Status: DC

## 2021-08-11 MED ORDER — CLOPIDOGREL BISULFATE 75 MG PO TABS: 75.0000 mg | ORAL_TABLET | Freq: Every day | ORAL | 3 refills | Status: DC

## 2021-08-11 NOTE — Assessment & Plan Note (Signed)
Stable, doing well. Will need refill of ileal conduit bags in early 2023.

## 2021-08-11 NOTE — Telephone Encounter (Signed)
Noted, thanks! Joellen, FYI . I have changed her medications so no need to put this in her med list.

## 2021-08-11 NOTE — Telephone Encounter (Signed)
Santiago Glad, Pharmacist with [facility] in [place] called stating she received request from [place] to get an updated medication list on file for the patient. Santiago Glad gave this information verbally.  Below are the medications on file with them and the date of last fill- all for 90 day supply:  1) Amlodipine 5 mg take 1 1/2 tablet once daily-03/14/21  2) Plavix 75 mg 1 tablet daily-03/14/21  3) Lisinopril 20 mg 1 tablet daily- 03/14/21  4) HCTZ 25 mg 1 tablet daily-03/14/21  5) Atorvastatin 40 mg 1/2 tablet daily-12/23/20

## 2021-08-11 NOTE — Patient Instructions (Signed)
Take your mediations everyday.  I combined two of your medications into one pill: Start taking lisinopril-hydrochlorothiazide 20-25 mg once daily for blood pressure.  I sent in a new pill for amlodipine 2.5 mg, take this once daily for blood pressure. Do not cut in half.   Continue taking clopidogrel 75 mg for stroke.  Stop by the lab prior to leaving today. I will notify you of your results once received.   You will be contacted regarding your referral to Neurology.  Please let us know if you have not been contacted within two weeks.   Please schedule a follow up visit to meet back with me in 2-3 weeks for blood pressure check.   It was a pleasure to meet you today! Please don't hesitate to contact me with any questions. Welcome to [facility]!

## 2021-08-11 NOTE — Assessment & Plan Note (Signed)
Above goal in the office today, also missing doses of lisinopril and HCTZ, only taking 2.5 mg of amlodipine.  Will combine lisinopril-HCTZ 20-25 mg and change amlodipine to 2.5 mg. New Rx's sent to pharmacy.  Stressed the importance of medication compliance. We will see her in 2-3 weeks for BP follow up.   Labs pending.

## 2021-08-11 NOTE — Assessment & Plan Note (Signed)
Last completed in May 2021, no further follow up required. History of biopsy.

## 2021-08-11 NOTE — Progress Notes (Signed)
Subjective:    Patient ID: Sara Aguirre, female    DOB: Jul 07, 1941, 80 y.o.   MRN: [id number]  HPI  Sara Aguirre is a very pleasant 80 y.o. female with a history of CVA, hypertension, thyroid nodule, renal insufficiency, bladder cancer, hyperlipidemia who presents today who presents today to establish care and discuss the problems mentioned below. Will obtain/review records.  She is accompanied by her son today, recently moved from [place] to [place] to live with her son.   1) Essential Hypertension: Also with CKD. Currently managed on amlodipine 7.5 mg daily, lisinopril 20 mg daily, HCTZ 25 mg daily.   She is non compliant to her BP regimen as she doesn't want to remain on BP medication, wants to get off of all medications. She will take half doses of her medication to "test" and see if she really needs these medications.   She misses doses of her lisinopril and HCTZ 1-2 times weekly, is taking 2.5 mg of amlodipine only given symptoms of ankle edema on 7.5 mg dose. She does not experience ankle swelling with 2.5 mg of amlodipine.   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 Managed on atorvastatin 20 mg and clopidogrel 75 mg. LDL goal of <70. Last lipid panel was in January 2022 with LDL of 85.   Following with neurology in [place], last visit in September 2021. From this note it is mentioned that her Loop recorder has not shown atrial fibrillation. Embolic work up in hospital stay without DVT, Echo with LVEF of 55-60%. Recommendation was to follow up 6 months later (April 2022). No recent visit with neurology.   Following with cardiology, Loop recorder in place. Evaluation of Loop recorder remotely in July 2022 does not reveal atrial fibrillation.   Today she endorses not taking her atorvastatin as it caused her muscle aches. She has moved in with her son as "her home became unnamable" per her son. She is on a plant based diet but her son is making her eat meat  in order to gain weight.   Her son plans on managing her medications moving forward.   She denies increased weakness, dizziness. 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 She believes that her stroke was secondary to [employer] Covid vaccine.   BP Readings from Last 3 Encounters:  08/11/21 (!) 155/80  12/23/20 136/74  11/04/20 (!) 150/90       Review of Systems  Eyes:  Negative for visual disturbance.  Respiratory:  Negative for shortness of breath.   Cardiovascular:  Negative for chest pain.  Neurological:  Positive for numbness. Negative for weakness and headaches.        Past Medical History:  Diagnosis Date   Cancer [facility])    bladder, s/p cystectomy with ileal conduit   History of bladder cancer    History of tobacco use    Hyperlipidemia    Hypertension    Incarcerated ventral hernia, s/p lap repair 12/05/2011   Parastomal hernia of ileal conduit, s/p lap repair [id number] 12/04/2011   Ulcer, gastric, acute or chronic     Social History   Socioeconomic History   Marital status: Widowed    Spouse name: Not on file   Number of children: 2   Years of education: Not on file   Highest education level: Some college, no degree  Occupational History   Occupation: retired  Tobacco Use   Smoking  status: Former    Types: Cigarettes    Quit date: 01/02/1993    Years since quitting: 28.6   Smokeless tobacco: Never  Vaping Use   Vaping Use: Never used  Substance and Sexual Activity   Alcohol use: No   Drug use: No   Sexual activity: Not on file  Other Topics Concern   Not on file  Social History Narrative   Marital status: widowed since 69; not dating; not interested      Children:  Twin boys (52); 2 grandchildren      Lives: alone with dog, 5 chickens, 2 rabbits      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  Gardening      ADLs: drives; independent with ADLs; has garden      Advanced Directives: YES: full code but no prolonged measures; HCPOA: Orvis Brill or other son Cory Munch; copy on chart 04/2017.   Social Determinants of Health   Financial Resource Strain: Not on file  Food Insecurity: Not on file  Transportation Needs: Not on file  Physical Activity: Not on file  Stress: Not on file  Social Connections: Not on file  Intimate Partner Violence: Not on file    Past Surgical History:  Procedure Laterality Date   BLADDER REMOVAL  10/1993   Dr. Risa Grill   HERNIA REPAIR  12/05/11   parastomal   LOOP RECORDER INSERTION N/A 04/23/2020   Procedure: LOOP RECORDER INSERTION;  Surgeon: Deboraha Sprang, MD;  Location: [place] [facility];  Service: Cardiovascular;  Laterality: N/A;   PARASTOMAL HERNIA REPAIR  12/05/2011   Procedure: HERNIA REPAIR PARASTOMAL;  Surgeon: Adin Hector, MD;  Location: [facility];  Service: General;;  Laprascopic lysis of adhestons with reduction and repair with biological mesh for incarcerated parastomal and ventral long incisional hernia.   REVISION UROSTOMY CUTANEOUS     VENTRAL HERNIA REPAIR  12/05/2011   Procedure: HERNIA REPAIR VENTRAL ADULT;  Surgeon: Adin Hector, MD;  [facility]: [facility];  Service: General;;    Family History  Problem Relation Age of Onset   Heart disease Mother 43       CHF   Alcohol abuse Father    Stroke Father     Allergies  Allergen Reactions   Imodium [Loperamide]     Itchy all over   Loperamide Hcl Rash    Current Outpatient Medications on File Prior to Visit  Medication Sig Dispense Refill   Blood Pressure Monitor DEVI Use to check blood pressure 3 times a week 1 Device 0   UNABLE TO FIND HOLISTER UROSTOMY POUCHES ITEM 8478 100 Device 3   cyanocobalamin 1000 MCG tablet Take 1,000 mcg by mouth daily.     No current facility-administered medications on file prior  to visit.    BP (!) 155/80   Pulse 80   Temp 98.1 F (36.7 C) (Temporal)   Ht 5\' 8"  (1.727 m)   Wt 143 lb (64.9 kg)   SpO2 98%   BMI 21.74 kg/m  Objective:   Physical Exam Cardiovascular:     Rate and Rhythm: Normal rate and regular rhythm.  Pulmonary:     Effort: Pulmonary effort is normal.     Breath sounds: Normal breath sounds.  Musculoskeletal:     Cervical  back: Neck supple.  Skin:    General: Skin is warm and dry.  Neurological:     Mental Status: She is alert and oriented to person, place, and time.  Psychiatric:        Mood and Affect: Mood normal.          Assessment & Plan:      This visit occurred during the SARS-CoV-2 public health emergency.  Safety protocols were in place, including screening questions prior to the visit, additional usage of staff PPE, and extensive cleaning of exam room while observing appropriate contact time as indicated for disinfecting solutions.

## 2021-08-11 NOTE — Assessment & Plan Note (Signed)
LDL goal of <70 given history of CVA. Intolerant to atorvastatin at 20 mg and 40 mg.  Repeat lipid panel pending today. Consider Crestor or pravastatin.

## 2021-08-11 NOTE — Assessment & Plan Note (Signed)
CKD stage III.  Discussed the importance of BP control in the setting of CKD.   Labs pending.

## 2021-08-11 NOTE — Assessment & Plan Note (Signed)
In May 2021, right sided, no atrial fibrillation seen on loop recorder thus far. No new symptoms.  Stressed the importance of compliance to statin therapy and clopidogrel.  She could not tolerate atorvastatin. Check lipids today. Consider starting rosuvastatin or pravastatin.   Referral placed to neurology.

## 2021-08-12 MED ORDER — AMLODIPINE BESYLATE 2.5 MG PO TABS: 2.5000 mg | ORAL_TABLET | Freq: Every day | ORAL | 3 refills | Status: DC

## 2021-08-12 MED ORDER — LISINOPRIL-HYDROCHLOROTHIAZIDE 20-25 MG PO TABS: 1.0000 | ORAL_TABLET | Freq: Every day | ORAL | 3 refills | Status: DC

## 2021-08-12 MED ORDER — CLOPIDOGREL BISULFATE 75 MG PO TABS: 75.0000 mg | ORAL_TABLET | Freq: Every day | ORAL | 3 refills | Status: DC

## 2021-08-12 NOTE — Addendum Note (Signed)
Addended by: Kris Mouton on: 08/12/2021 04:31 PM   Modules accepted: Orders

## 2021-08-12 NOTE — Telephone Encounter (Signed)
Sent after first message.

## 2021-08-13 ENCOUNTER — Other Ambulatory Visit: Payer: Self-pay | Admitting: Primary Care

## 2021-08-13 DIAGNOSIS — E782 Mixed hyperlipidemia: Secondary | ICD-10-CM

## 2021-08-13 DIAGNOSIS — N289 Disorder of kidney and ureter, unspecified: Secondary | ICD-10-CM

## 2021-08-13 MED ORDER — ROSUVASTATIN CALCIUM 5 MG PO TABS: 5.0000 mg | ORAL_TABLET | Freq: Every day | ORAL | 3 refills | Status: DC

## 2021-08-25 ENCOUNTER — Ambulatory Visit (INDEPENDENT_AMBULATORY_CARE_PROVIDER_SITE_OTHER): Payer: PPO | Admitting: Primary Care

## 2021-08-25 ENCOUNTER — Encounter: Payer: Self-pay | Admitting: Primary Care

## 2021-08-25 ENCOUNTER — Other Ambulatory Visit: Payer: Self-pay

## 2021-08-25 VITALS — BP 148/82 | HR 74 | Temp 98.6°F | Ht 68.0 in | Wt 142.0 lb

## 2021-08-25 DIAGNOSIS — I10 Essential (primary) hypertension: Secondary | ICD-10-CM | POA: Diagnosis not present

## 2021-08-25 DIAGNOSIS — N289 Disorder of kidney and ureter, unspecified: Secondary | ICD-10-CM | POA: Diagnosis not present

## 2021-08-25 DIAGNOSIS — E7800 Pure hypercholesterolemia, unspecified: Secondary | ICD-10-CM

## 2021-08-25 MED ORDER — AMLODIPINE BESYLATE 5 MG PO TABS: 5.0000 mg | ORAL_TABLET | Freq: Every day | ORAL | 2 refills | Status: DC

## 2021-08-25 NOTE — Assessment & Plan Note (Signed)
Uncontrolled today. She is compliant.  Increase amlodipine to 5 mg. Continue lisinopril-HCTZ 20-25 mg.   Her son will send updated readings via My chart in 2 weeks. She will also be seeing nephrology

## 2021-08-25 NOTE — Assessment & Plan Note (Signed)
Decline noted on prior labs which were reviewed today.  She has an appointment with nephrology scheduled for early October.  Working to gain better control over BP.

## 2021-08-25 NOTE — Patient Instructions (Signed)
We increased your amlodipine to 5 mg. I sent a new prescription to the pharmacy.  Continue taking lisinopril-HCTZ 20-25 mg once daily for blood pressure.  Please send me blood pressure readings via My Chart in 2 weeks.  Return for cholesterol labs in 2 months as scheduled.   It was a pleasure to see you today!

## 2021-08-25 NOTE — Assessment & Plan Note (Signed)
Compliant to rosuvastatin 5 mg. Repeat lipids in 2 months.

## 2021-08-25 NOTE — Progress Notes (Signed)
Subjective:    Patient ID: Sara Aguirre, female    DOB: [birth year], 80 y.o.   MRN: [id number]  HPI  Sara Aguirre is a very pleasant 80 y.o. female with a history of hypertension, renal insufficiency, CVA, bladder cancer who presents today for follow up of hypertension.  She was last evaluated as a new patient on 08/11/21. Endorsed non compliance to BP regimen of amlodipine 7.5 mg, lisinopril 20 mg, and HCTZ 25 mg. She also endorsed ankle swelling and had been taking amlodipine 2.5 mg rather than 7.5. she was also frustrated with how many pills she's taking.   Given this information we combined her lisinopril-HCTZ 20-25 mg and changed amlodipine to 2.5 mg tablet. Labs returned with decline in renal function and uncontrolled hyperlipidemia. She was asked to start rosuvastatin 5 mg. She is here today for follow up.  Since her last visit she is compliant to her blood pressure medications daily. She denies ankle swelling, headaches, chest pain. She is checking her BP at home which is running 150's/80's.   She has an appointment scheduled for a renal ultrasound in late September and with nephrology in late October.   BP Readings from Last 3 Encounters:  08/25/21 (!) 158/82  08/11/21 (!) 155/80  12/23/20 136/74     Review of Systems  Eyes:  Negative for visual disturbance.  Respiratory:  Negative for shortness of breath.   Cardiovascular:  Negative for chest pain and leg swelling.  Neurological:  Negative for dizziness and headaches.        Past Medical History:  Diagnosis Date   Cancer [facility])    bladder, s/p cystectomy with ileal conduit   History of bladder cancer    History of tobacco use    Hyperlipidemia    Hypertension    Incarcerated ventral hernia, s/p lap repair 12/05/2011   Parastomal hernia of ileal conduit, s/p lap repair [id number] 12/04/2011   Ulcer, gastric, acute or chronic     Social History   Socioeconomic History   Marital status: Widowed    Spouse name:  Not on file   Number of children: 2   Years of education: Not on file   Highest education level: Some college, no degree  Occupational History   Occupation: retired  Tobacco Use   Smoking status: Former    Types: Cigarettes    Quit date: 01/02/1993    Years since quitting: 28.6   Smokeless tobacco: Never  Vaping Use   Vaping Use: Never used  Substance and Sexual Activity   Alcohol use: No   Drug use: No   Sexual activity: Not on file  Other Topics Concern   Not on file  Social History Narrative   Marital status: widowed since 1992; not dating; not interested      Children:  Twin boys (4); 2 grandchildren      Lives: alone with dog, 5 chickens, 2 rabbits      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  Gardening      ADLs: drives; independent with ADLs; has garden      Advanced Directives: YES: full code but no prolonged measures; HCPOA: Orvis Brill or other son Cory Munch; copy on chart 04/2017.   Social Determinants of Health   Financial Resource Strain: Not  on file  Food Insecurity: Not on file  Transportation Needs: Not on file  Physical Activity: Not on file  Stress: Not on file  Social Connections: Not on file  Intimate Partner Violence: Not on file    Past Surgical History:  Procedure Laterality Date   BLADDER REMOVAL  10/1993   Dr. Risa Grill   HERNIA REPAIR  12/05/11   parastomal   LOOP RECORDER INSERTION N/A 04/23/2020   Procedure: LOOP RECORDER INSERTION;  Surgeon: Deboraha Sprang, MD;  Location: [facility];  Service: Cardiovascular;  Laterality: N/A;   PARASTOMAL HERNIA REPAIR  12/05/2011   Procedure: HERNIA REPAIR PARASTOMAL;  Surgeon: Adin Hector, MD;  Location: [facility];  Service: General;;  Laprascopic lysis of adhestons with reduction and repair with biological mesh for incarcerated parastomal and  ventral long incisional hernia.   REVISION UROSTOMY CUTANEOUS     VENTRAL HERNIA REPAIR  12/05/2011   Procedure: HERNIA REPAIR VENTRAL ADULT;  Surgeon: Adin Hector, MD;  Location: [facility];  Service: General;;    Family History  Problem Relation Age of Onset   Heart disease Mother 44       CHF   Alcohol abuse Father    Stroke Father     Allergies  Allergen Reactions   Imodium [Loperamide]     Itchy all over   Loperamide Hcl Rash    Current Outpatient Medications on File Prior to Visit  Medication Sig Dispense Refill   amLODipine (NORVASC) 2.5 MG tablet Take 1 tablet (2.5 mg total) by mouth daily. For blood pressure. 90 tablet 3   Blood Pressure Monitor DEVI Use to check blood pressure 3 times a week 1 Device 0   clopidogrel (PLAVIX) 75 MG tablet Take 1 tablet (75 mg total) by mouth daily. For stroke 90 tablet 3   cyanocobalamin 1000 MCG tablet Take 1,000 mcg by mouth daily.     lisinopril-hydrochlorothiazide (ZESTORETIC) 20-25 MG tablet Take 1 tablet by mouth daily. For blood pressure. 90 tablet 3   rosuvastatin (CRESTOR) 5 MG tablet Take 1 tablet (5 mg total) by mouth daily. For cholesterol. 90 tablet 3   UNABLE TO FIND HOLISTER UROSTOMY POUCHES ITEM 8478 100 Device 3   No current facility-administered medications on file prior to visit.    BP (!) 158/82   Pulse 74   Temp 98.6 F (37 C) (Temporal)   Ht 5\' 8"  (1.727 m)   Wt 142 lb (64.4 kg)   SpO2 97%   BMI 21.59 kg/m  Objective:   Physical Exam Cardiovascular:     Rate and Rhythm: Normal rate and regular rhythm.     Comments: No lower extremity edema noted. Pulmonary:     Effort: Pulmonary effort is normal.     Breath sounds: Normal breath sounds.  Musculoskeletal:     Cervical back: Neck supple.  Skin:    General: Skin is warm and dry.          Assessment & Plan:      This visit occurred during the SARS-CoV-2 public health emergency.  Safety protocols were in place, including screening questions  prior to the visit, additional usage of staff PPE, and extensive cleaning of exam room while observing appropriate contact time as indicated for disinfecting solutions.

## 2021-08-30 ENCOUNTER — Ambulatory Visit: Payer: PPO

## 2021-09-12 ENCOUNTER — Ambulatory Visit
Admission: RE | Admit: 2021-09-12 | Discharge: 2021-09-12 | Disposition: A | Discharge status: Home / Self Care | Payer: PPO | Source: Ambulatory Visit | Attending: Primary Care | Admitting: Primary Care

## 2021-09-12 ENCOUNTER — Other Ambulatory Visit: Payer: Self-pay

## 2021-09-12 DIAGNOSIS — N289 Disorder of kidney and ureter, unspecified: Secondary | ICD-10-CM | POA: Insufficient documentation

## 2021-09-12 DIAGNOSIS — N1330 Unspecified hydronephrosis: Secondary | ICD-10-CM | POA: Insufficient documentation

## 2021-09-12 DIAGNOSIS — Z906 Acquired absence of other parts of urinary tract: Secondary | ICD-10-CM | POA: Insufficient documentation

## 2021-09-12 DIAGNOSIS — N189 Chronic kidney disease, unspecified: Secondary | ICD-10-CM | POA: Diagnosis not present

## 2021-09-12 IMAGING — US US RENAL
1 series · 14 of 25 positions shown · non-contrast
Comparison: CT 12 17 [RZ]

CLINICAL DATA: Chronic renal disease. Prior cystectomy and
urostomy.

EXAM:
RENAL / URINARY TRACT ULTRASOUND COMPLETE

[Series 1: us renal · 0.19mm/px · 14 of 32 slices shown]
[im 1/32]
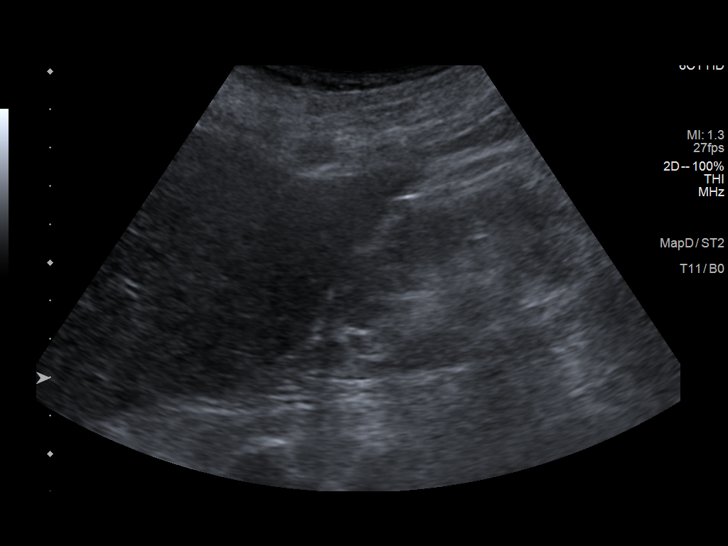
[im 3/32]
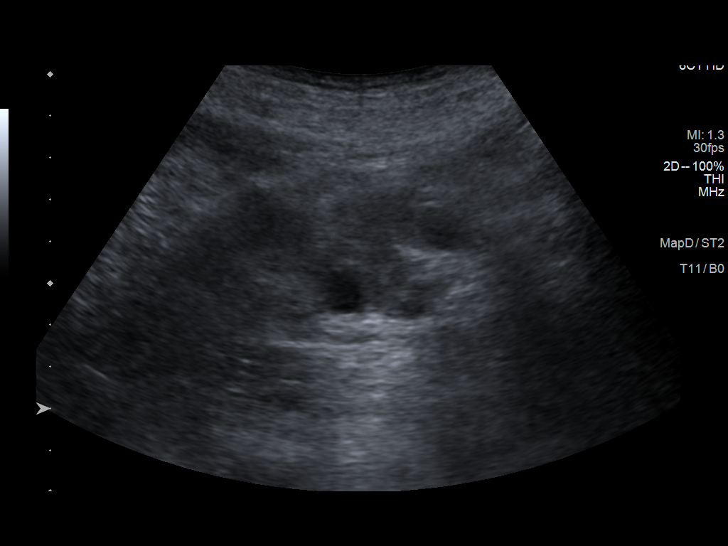
[im 6/32]
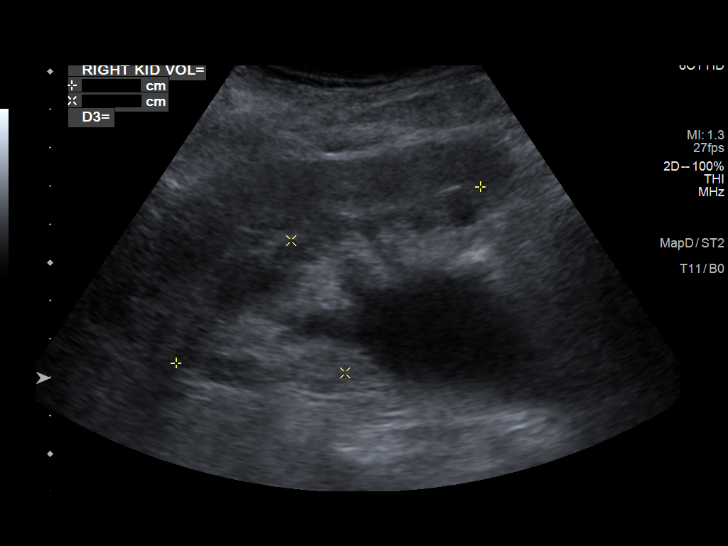
[im 8/32]
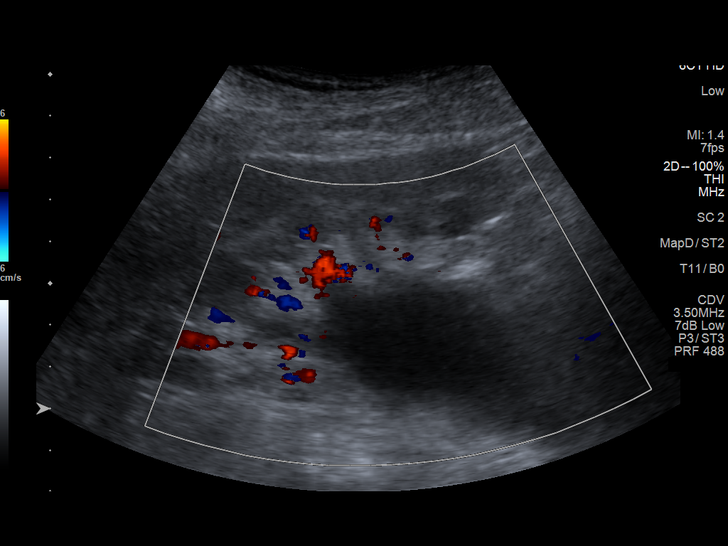
[im 11/32]
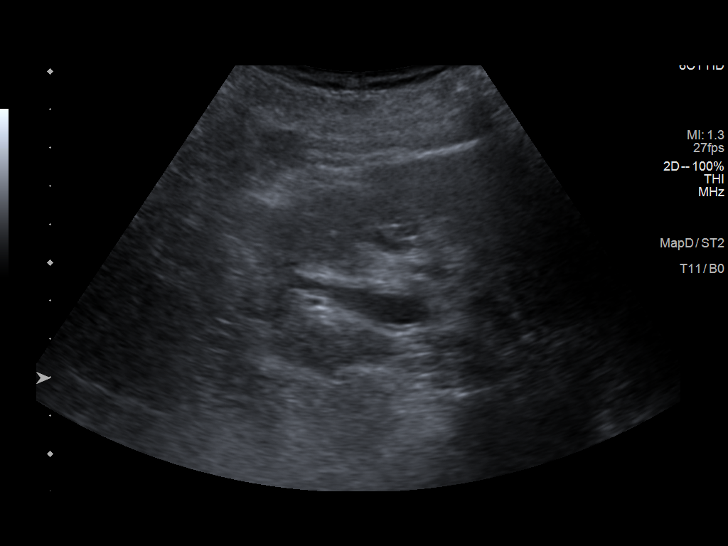
[im 12/32]
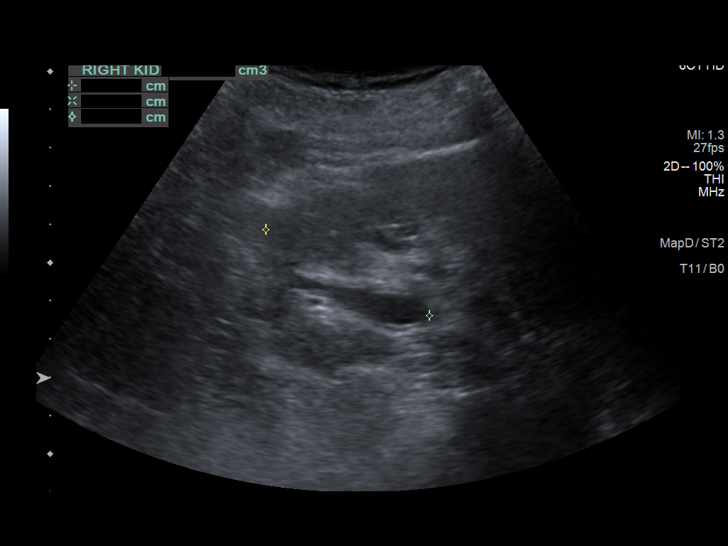
[im 15/32]
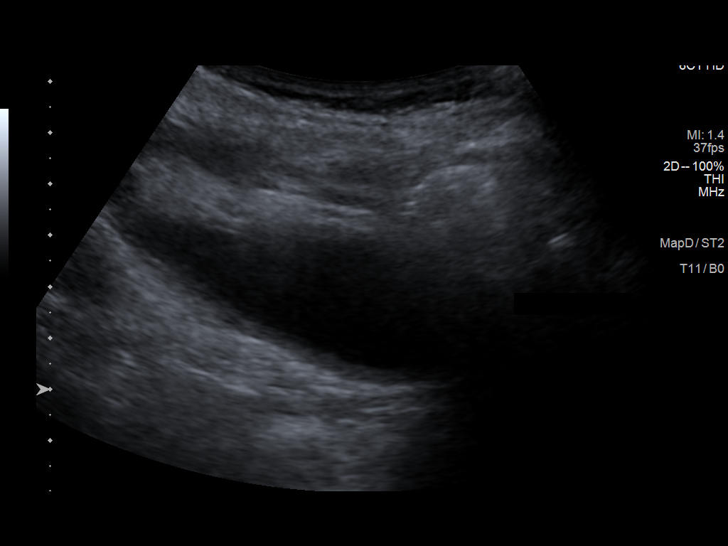
[im 17/32]
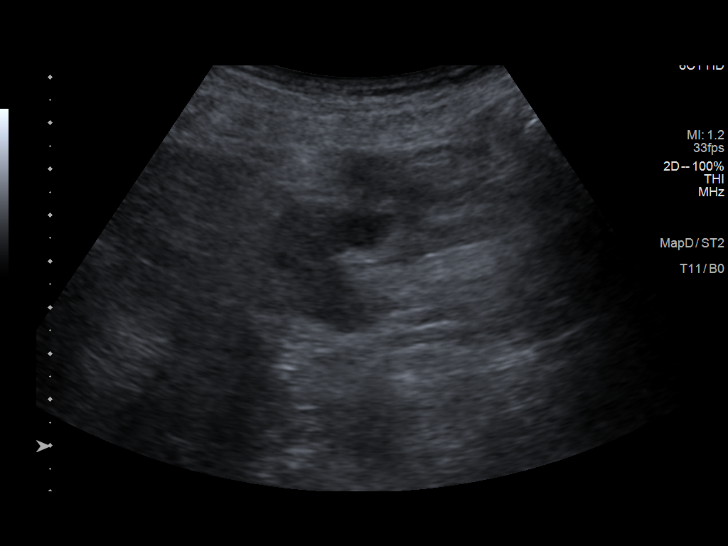
[im 20/32]
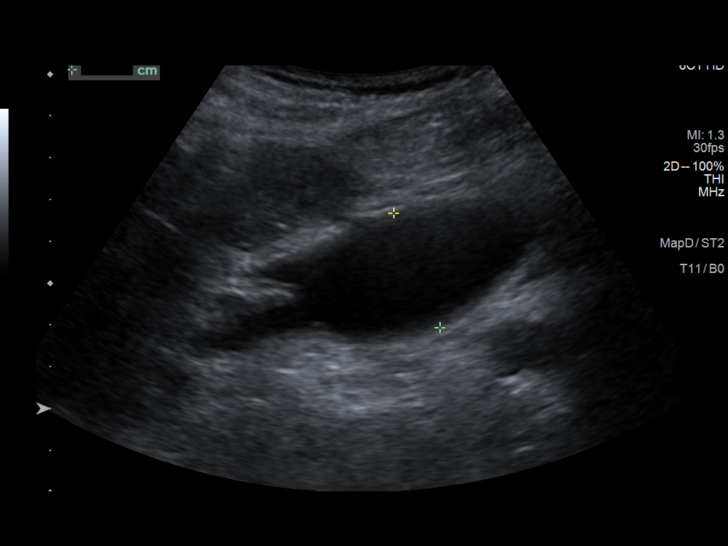
[im 21/32]
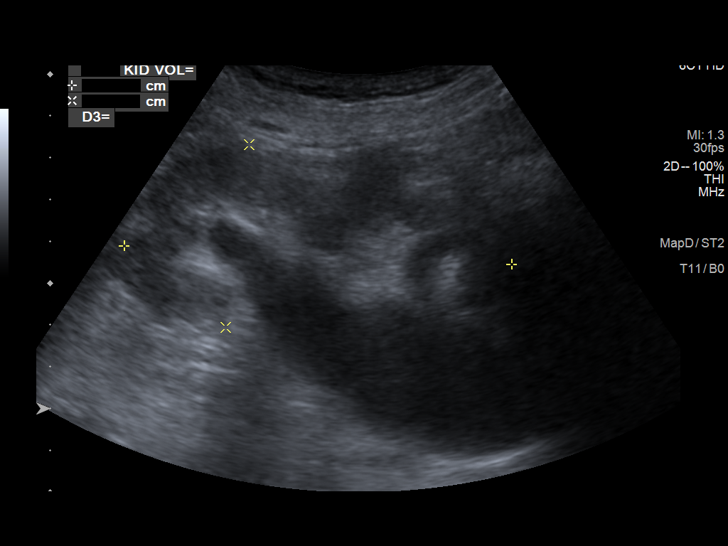
[im 24/32]
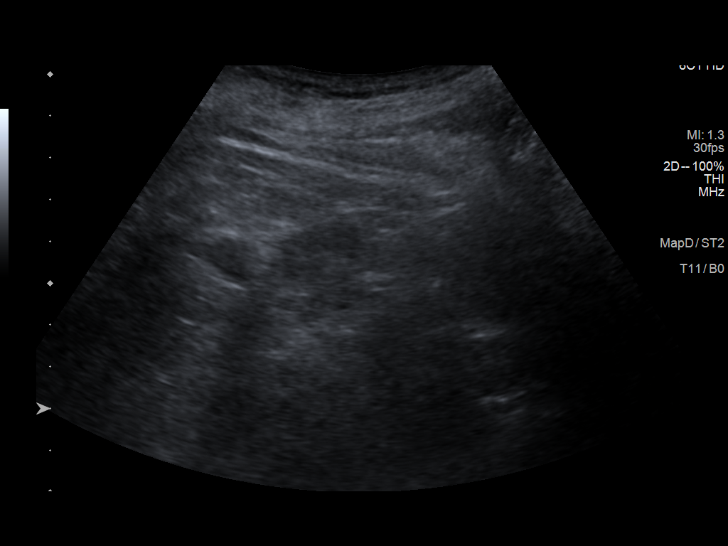
[im 26/32]
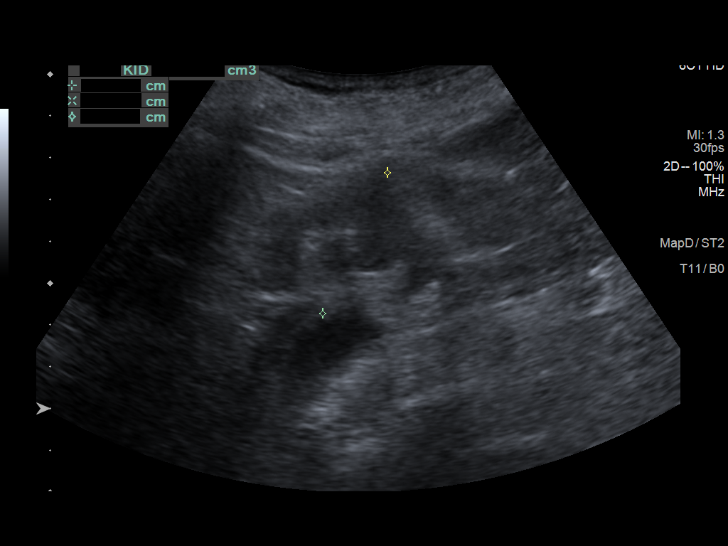
[im 29/32]
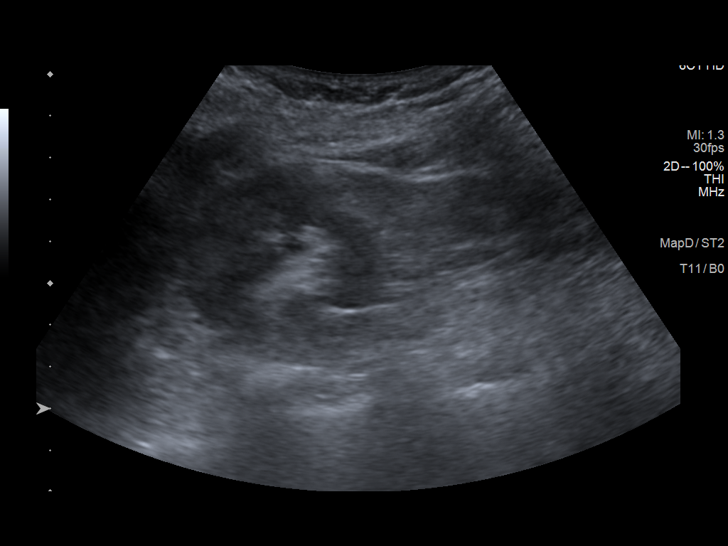
[im 32/32]
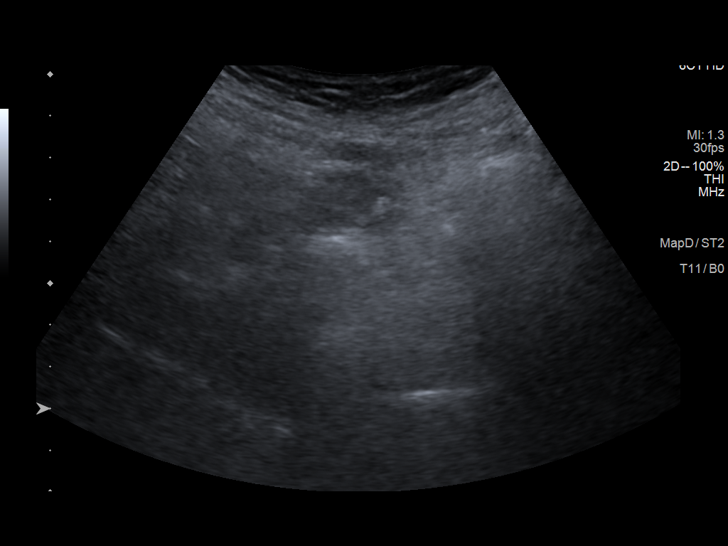

[14 of 25 positions shown; findings below may reference images not displayed]

FINDINGS: Right Kidney:

Renal measurements: 9.2 x 3.7 x 4.8 cm = volume: 87 mL. Increased
echogenicity. Renal cortical thinning. Mild to moderate
hydronephrosis. Tiny simple cysts can not be excluded. No solid mass
noted.

Left Kidney:

Renal measurements: 9.3 x 4.4 x 3.7 cm = volume: 80 mL. Increased
echogenicity. Renal cortical thinning. Mild to moderate
hydronephrosis. Tiny simple cyst can not be excluded. No solid mass
noted.

Bladder:

Prior cystectomy.

Other:

None.
IMPRESSION: 1. Increased echogenicity both kidneys consistent with chronic
medical renal disease. Renal cortical thinning noted bilaterally.

2. Mild to moderate bilateral hydronephrosis again noted, similar
findings noted on prior CT. Prior cystectomy.

## 2021-09-22 ENCOUNTER — Ambulatory Visit: Payer: PPO | Admitting: Primary Care

## 2021-09-27 ENCOUNTER — Encounter: Payer: Self-pay | Admitting: Family Medicine

## 2021-09-27 DIAGNOSIS — R8290 Unspecified abnormal findings in urine: Secondary | ICD-10-CM | POA: Diagnosis not present

## 2021-09-27 DIAGNOSIS — R319 Hematuria, unspecified: Secondary | ICD-10-CM

## 2021-09-27 DIAGNOSIS — R809 Proteinuria, unspecified: Secondary | ICD-10-CM | POA: Diagnosis not present

## 2021-09-27 DIAGNOSIS — I129 Hypertensive chronic kidney disease with stage 1 through stage 4 chronic kidney disease, or unspecified chronic kidney disease: Secondary | ICD-10-CM | POA: Diagnosis not present

## 2021-09-27 DIAGNOSIS — N184 Chronic kidney disease, stage 4 (severe): Secondary | ICD-10-CM | POA: Diagnosis not present

## 2021-09-27 HISTORY — DX: Hematuria, unspecified: R31.9

## 2021-09-28 NOTE — Telephone Encounter (Signed)
FYI

## 2021-09-29 NOTE — Telephone Encounter (Signed)
Message handled by patient's current PCP.

## 2021-09-30 DIAGNOSIS — Z936 Other artificial openings of urinary tract status: Secondary | ICD-10-CM | POA: Diagnosis not present

## 2021-10-08 ENCOUNTER — Observation Stay: Payer: PPO

## 2021-10-08 ENCOUNTER — Observation Stay
Admission: EM | Admit: 2021-10-08 | Discharge: 2021-10-09 | Disposition: A | Discharge status: Home / Self Care | Payer: PPO | Attending: Internal Medicine | Admitting: Internal Medicine

## 2021-10-08 ENCOUNTER — Emergency Department: Payer: PPO

## 2021-10-08 ENCOUNTER — Other Ambulatory Visit: Payer: Self-pay

## 2021-10-08 DIAGNOSIS — N1832 Chronic kidney disease, stage 3b: Secondary | ICD-10-CM | POA: Diagnosis not present

## 2021-10-08 DIAGNOSIS — G459 Transient cerebral ischemic attack, unspecified: Principal | ICD-10-CM

## 2021-10-08 DIAGNOSIS — Z7902 Long term (current) use of antithrombotics/antiplatelets: Secondary | ICD-10-CM | POA: Diagnosis not present

## 2021-10-08 DIAGNOSIS — R200 Anesthesia of skin: Secondary | ICD-10-CM | POA: Diagnosis present

## 2021-10-08 DIAGNOSIS — U071 COVID-19: Secondary | ICD-10-CM | POA: Diagnosis not present

## 2021-10-08 DIAGNOSIS — Z87891 Personal history of nicotine dependence: Secondary | ICD-10-CM | POA: Diagnosis not present

## 2021-10-08 DIAGNOSIS — Z8551 Personal history of malignant neoplasm of bladder: Secondary | ICD-10-CM | POA: Insufficient documentation

## 2021-10-08 DIAGNOSIS — Z79899 Other long term (current) drug therapy: Secondary | ICD-10-CM | POA: Insufficient documentation

## 2021-10-08 DIAGNOSIS — N183 Chronic kidney disease, stage 3 unspecified: Secondary | ICD-10-CM | POA: Diagnosis present

## 2021-10-08 DIAGNOSIS — Z8673 Personal history of transient ischemic attack (TIA), and cerebral infarction without residual deficits: Secondary | ICD-10-CM | POA: Diagnosis not present

## 2021-10-08 DIAGNOSIS — I10 Essential (primary) hypertension: Secondary | ICD-10-CM | POA: Diagnosis not present

## 2021-10-08 DIAGNOSIS — E785 Hyperlipidemia, unspecified: Secondary | ICD-10-CM | POA: Diagnosis present

## 2021-10-08 LAB — CBC WITH DIFFERENTIAL/PLATELET
Abs Immature Granulocytes: 0.02 10*3/uL (ref 0.00–0.07)
Basophils Absolute: 0 10*3/uL (ref 0.0–0.1)
Basophils Relative: 0 %
Eosinophils Absolute: 0.2 10*3/uL (ref 0.0–0.5)
Eosinophils Relative: 2 %
HCT: 41.4 % (ref 36.0–46.0)
Hemoglobin: 13.7 g/dL (ref 12.0–15.0)
Immature Granulocytes: 0 %
Lymphocytes Relative: 11 %
Lymphs Abs: 1 10*3/uL (ref 0.7–4.0)
MCH: 30.7 pg (ref 26.0–34.0)
MCHC: 33.1 g/dL (ref 30.0–36.0)
MCV: 92.8 fL (ref 80.0–100.0)
Monocytes Absolute: 0.6 10*3/uL (ref 0.1–1.0)
Monocytes Relative: 7 %
Neutro Abs: 7 10*3/uL (ref 1.7–7.7)
Neutrophils Relative %: 80 %
Platelets: 264 10*3/uL (ref 150–400)
RBC: 4.46 MIL/uL (ref 3.87–5.11)
RDW: 13.2 % (ref 11.5–15.5)
WBC: 8.8 10*3/uL (ref 4.0–10.5)
nRBC: 0 % (ref 0.0–0.2)

## 2021-10-08 LAB — PROTIME-INR
INR: 1.1 (ref 0.8–1.2)
Prothrombin Time: 13.8 seconds (ref 11.4–15.2)

## 2021-10-08 LAB — COMPREHENSIVE METABOLIC PANEL
ALT: 10 U/L (ref 0–44)
AST: 20 U/L (ref 15–41)
Albumin: 3.8 g/dL (ref 3.5–5.0)
Alkaline Phosphatase: 59 U/L (ref 38–126)
Anion gap: 9 (ref 5–15)
BUN: 18 mg/dL (ref 8–23)
CO2: 29 mmol/L (ref 22–32)
Calcium: 9.7 mg/dL (ref 8.9–10.3)
Chloride: 101 mmol/L (ref 98–111)
Creatinine, Ser: 1.54 mg/dL — ABNORMAL HIGH (ref 0.44–1.00)
GFR, Estimated: 34 mL/min — ABNORMAL LOW (ref >60)
Glucose, Bld: 138 mg/dL — ABNORMAL HIGH (ref 70–99)
Potassium: 4.2 mmol/L (ref 3.5–5.1)
Sodium: 139 mmol/L (ref 135–145)
Total Bilirubin: 0.7 mg/dL (ref 0.3–1.2)
Total Protein: 7.4 g/dL (ref 6.5–8.1)

## 2021-10-08 LAB — CBG MONITORING, ED: Glucose-Capillary: 146 mg/dL — ABNORMAL HIGH (ref 70–99)

## 2021-10-08 LAB — RESP PANEL BY RT-PCR (FLU A&B, COVID) ARPGX2
Influenza A by PCR: NEGATIVE
Influenza B by PCR: NEGATIVE
SARS Coronavirus 2 by RT PCR: POSITIVE — AB

## 2021-10-08 LAB — TROPONIN I (HIGH SENSITIVITY): Troponin I (High Sensitivity): 6 ng/L (ref <18)

## 2021-10-08 IMAGING — MR MR MRA HEAD W/O CM
1 series · 18 of 48 positions shown · non-contrast
Comparison: No pertinent prior exam.

CLINICAL DATA: TIA

EXAM:
MRI HEAD WITHOUT CONTRAST
MRA HEAD WITHOUT CONTRAST
TECHNIQUE: Multiplanar, multi-echo pulse sequences of the brain and surrounding
structures were acquired without intravenous contrast. Angiographic
images of the Circle of Willis were acquired using MRA technique
without intravenous contrast.

[Series 5: TOF · axial · 0.5mm · 0.41mm/px · z∈[-87,+7]mm · 18 of 205 slices shown]
[im 1/205]
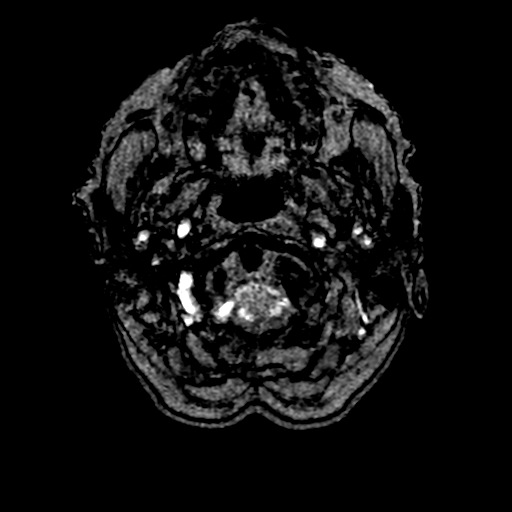
[im 5/205]
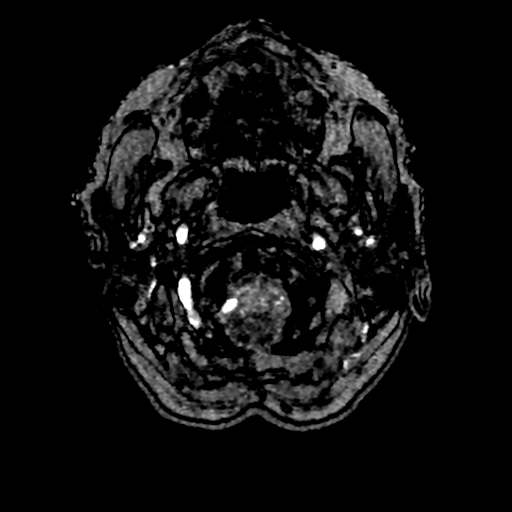
[im 9/205]
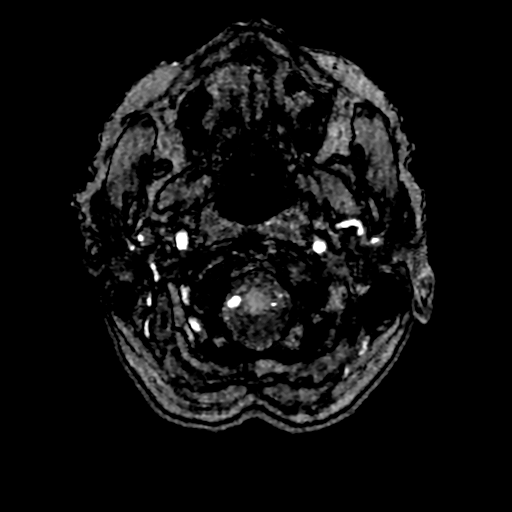
[im 14/205]
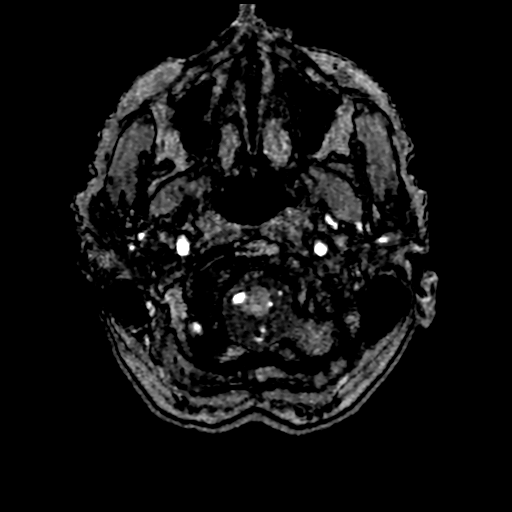
[im 18/205]
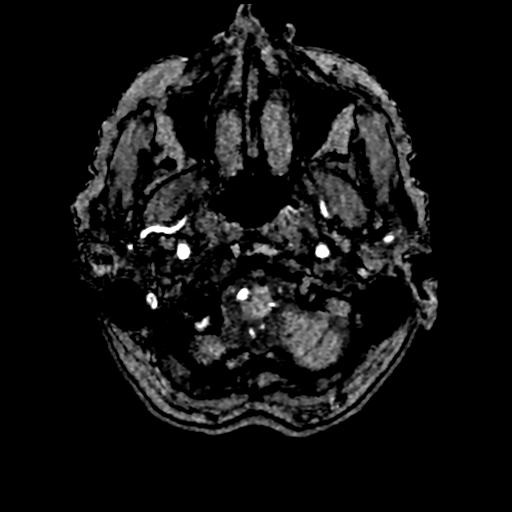
[im 22/205]
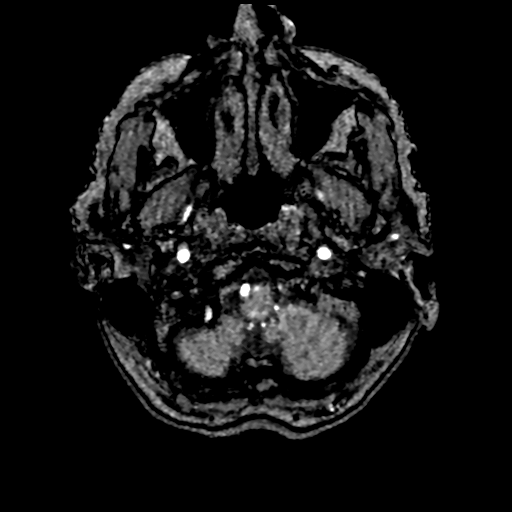
[im 27/205]
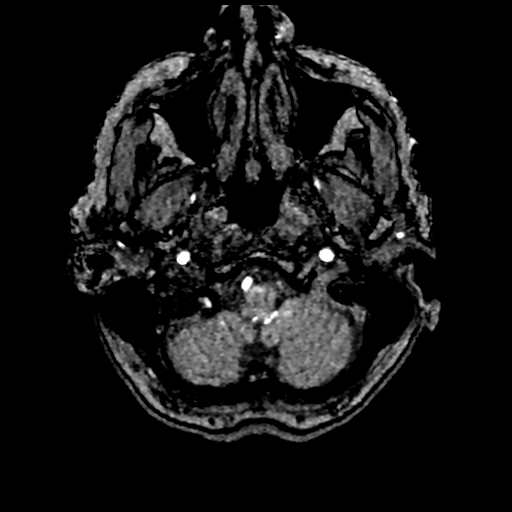
[im 31/205]
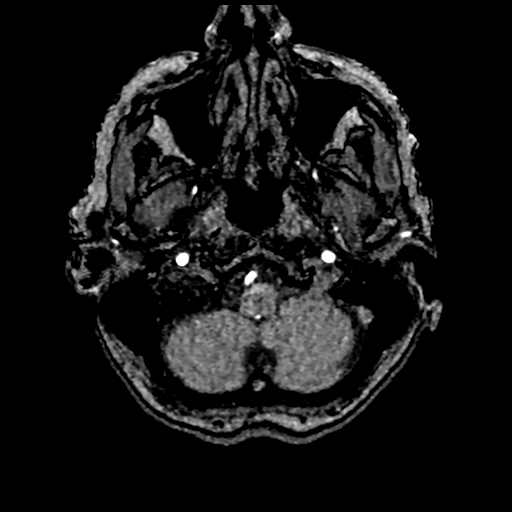
[im 35/205]
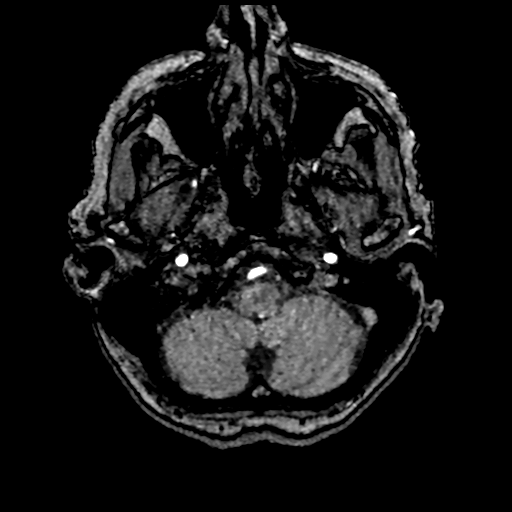
[im 40/205]
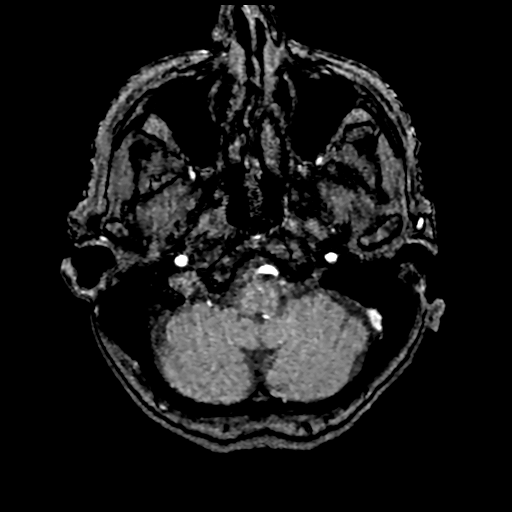
[im 66/205]
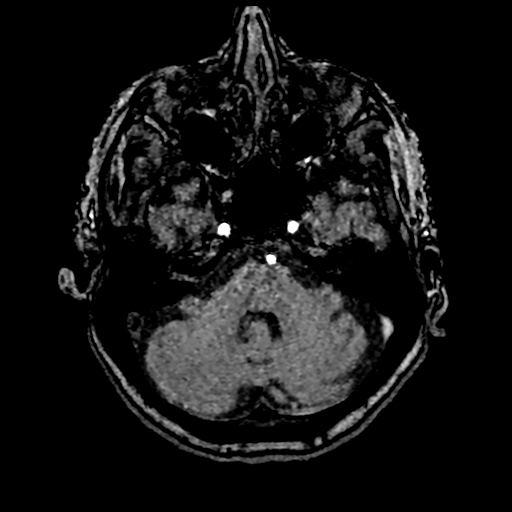
[im 92/205]
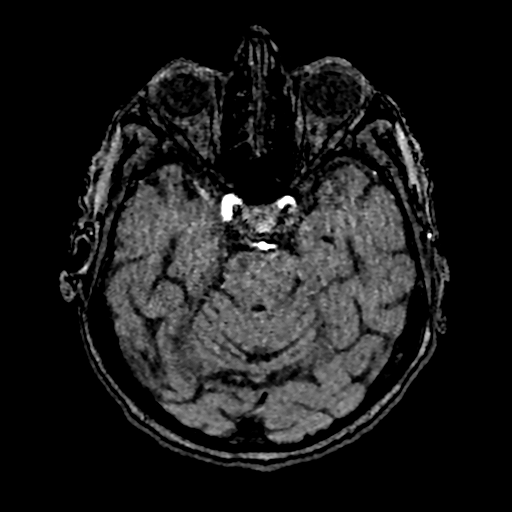
[im 105/205]
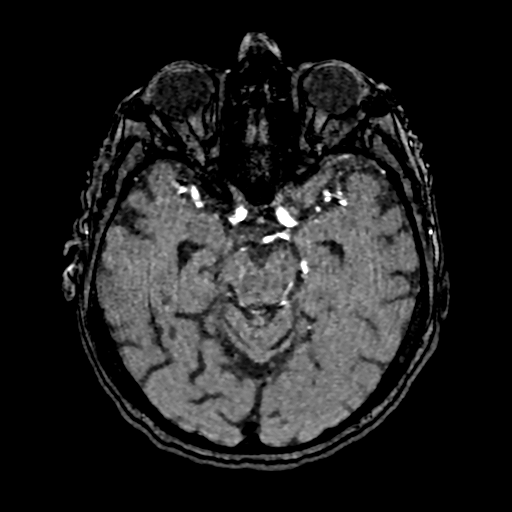
[im 118/205]
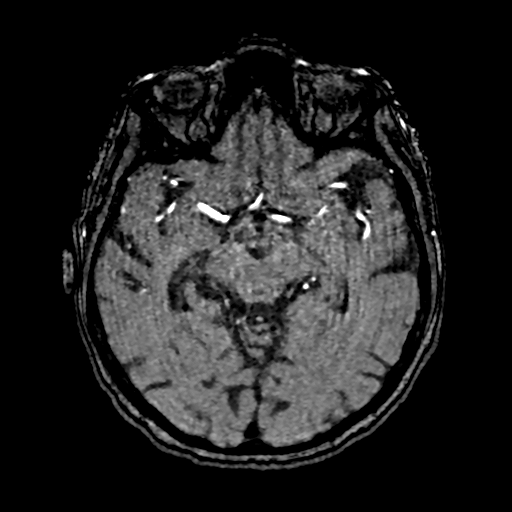
[im 144/205]
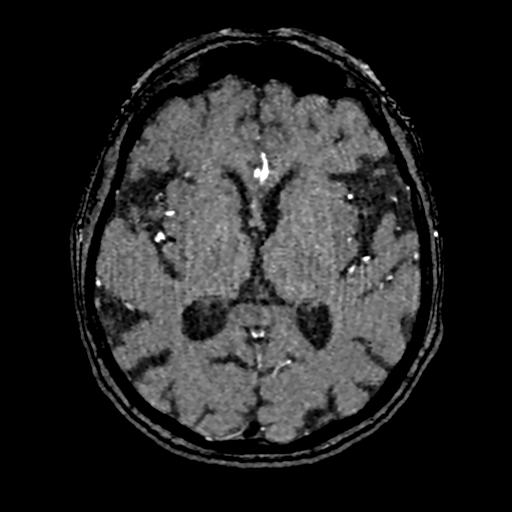
[im 170/205]
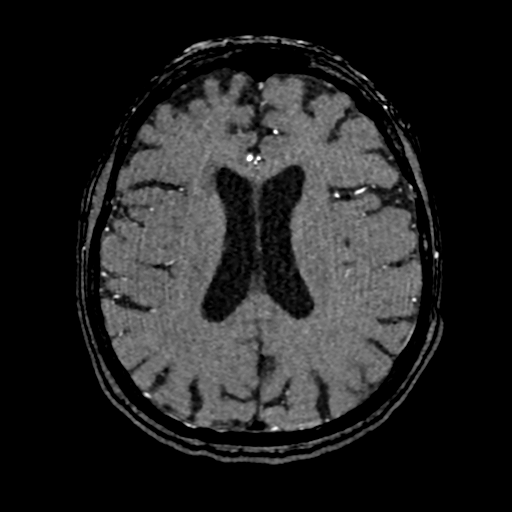
[im 174/205]
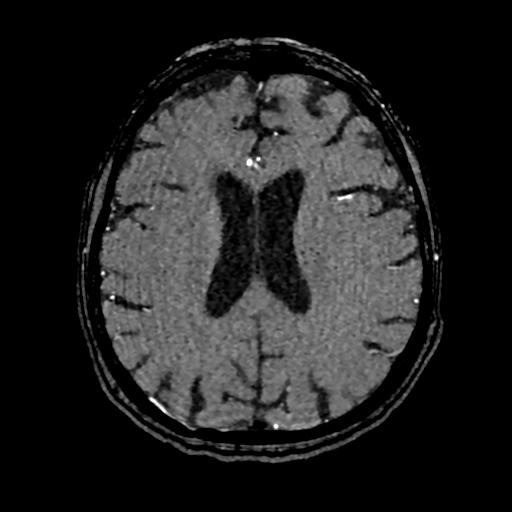
[im 196/205]
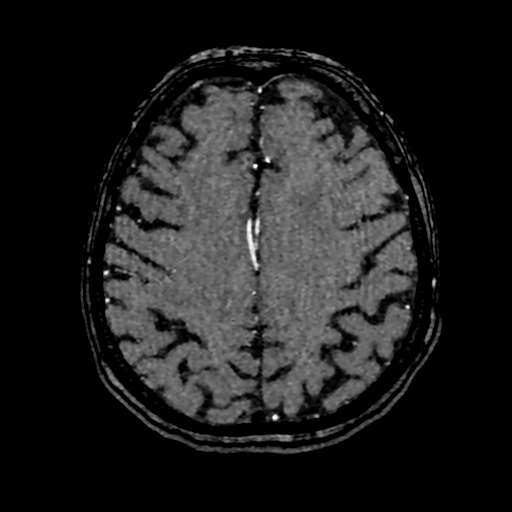

[18 of 48 positions shown; findings below may reference images not displayed]

FINDINGS: MRI HEAD FINDINGS

Brain: Restricted diffusion in the right parietal and occipital lobe
cortex (series 5, images 20-28, with additional punctate areas of
restricted diffusion right in the caudate head (series 5, image 26),
right temporal lobe (image 18), and para falcine posterior right
frontal lobe (image 36).

No acute hemorrhage, mass, mass effect, or midline shift. No
hydrocephalus or extra-axial collection. Confluent T2 hyperintense
signal in the periventricular white matter, likely the sequela of
severe chronic small vessel ischemic disease.

Vascular: See MRA findings below.

Skull and upper cervical spine: Normal marrow signal.

Sinuses/Orbits: Mucous retention cyst in the left maxillary sinus.
Status post bilateral lens replacements.

Other: The mastoids are well aerated.

MRA HEAD FINDINGS

Anterior circulation: Both internal carotid arteries are patent to
the termini, without stenosis or other abnormality. A1 segments
patent. Normal anterior communicating artery. Anterior cerebral
arteries are patent to their distal aspects. No M1 stenosis or
occlusion. Normal MCA bifurcations. Distal MCA branches perfused and
symmetric.

Posterior circulation: The left vertebral artery is hypoplastic or
significantly stenosed. The right vertebral artery is widely patent.
Posterior inferior cerebral arteries patent bilaterally. Basilar
patent to its distal aspect. Superior cerebral arteries patent
bilaterally. The left PCA is well perfused to its distal aspects
without stenosis. Focal stenosis in the right P1. The right
posterior communicating artery is visualized but demonstrates
stenosis distally (series 5, image 99). The junction and proximal
right P2 segment are not opacified (series 5, image 98), with poor
opacification of the remainder of the right PCA, likely severe
stenosis.

Anatomic variants: None significant
IMPRESSION: 1. Restricted diffusion in the right parietal and occipital lobe
cortex, with additional punctate areas of restricted diffusion
throughout the right cerebral hemisphere, likely acute infarcts.
Given multiple vascular territories, an embolic etiology is
suspected.
2. Poor opacification of the proximal right P1 and distal right
posterior communicating artery, with non visualization of the
junction and proximal right P2, with only minimal opacification
distally, likely severe stenosis.
3. Hypoplastic versus significantly stenosed intracranial left
vertebral artery.

These results were called by telephone at the time of interpretation
on [DATE] at [DATE] to provider Dr. EBNER, who verbally
acknowledged these results.

## 2021-10-08 IMAGING — CT CT HEAD W/O CM
3 series · 16 of 47 positions shown, 19 images · non-contrast
Comparison: Comparison made with [DATE].

CLINICAL DATA: An 80-year-old female presents with symptoms of TIA.

EXAM:
CT HEAD WITHOUT CONTRAST
TECHNIQUE: Contiguous axial images were obtained from the base of the skull
through the vertex without intravenous contrast.

[Series 2: head wo · axial · 0.42mm/px · z∈[-75,+65]mm · 10 of 34 slices shown, 13 images]
[im 3/34  brain]
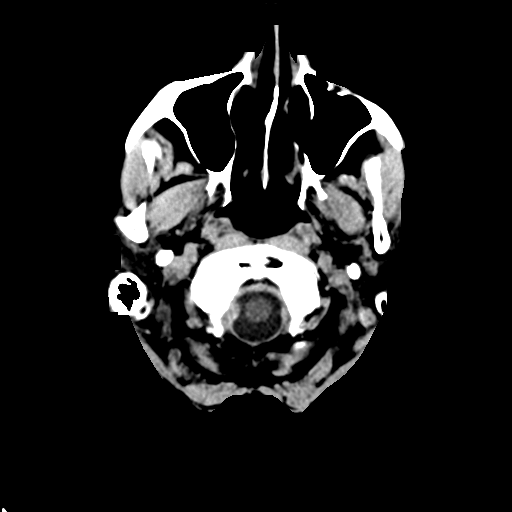
[im 3/34  bone]
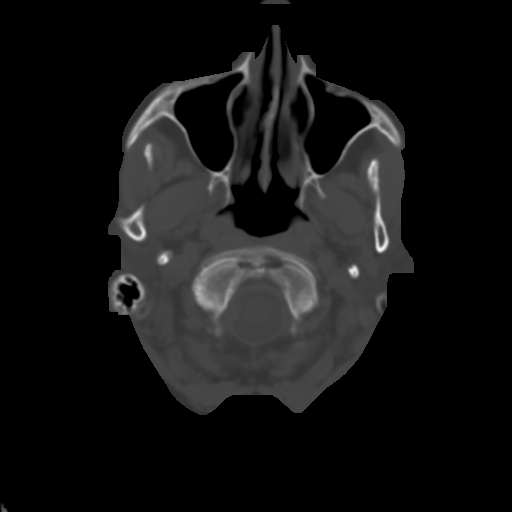
[im 6/34  brain]
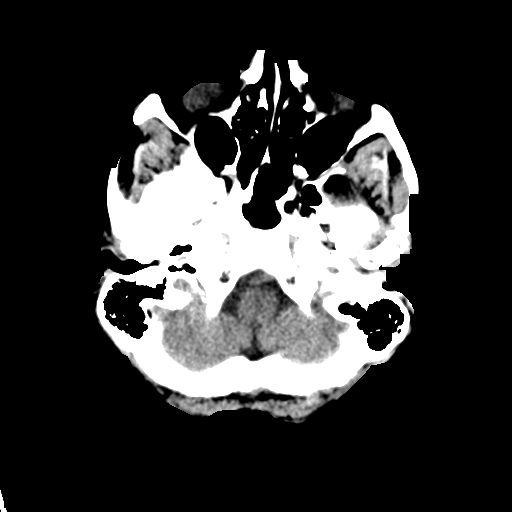
[im 10/34  brain]
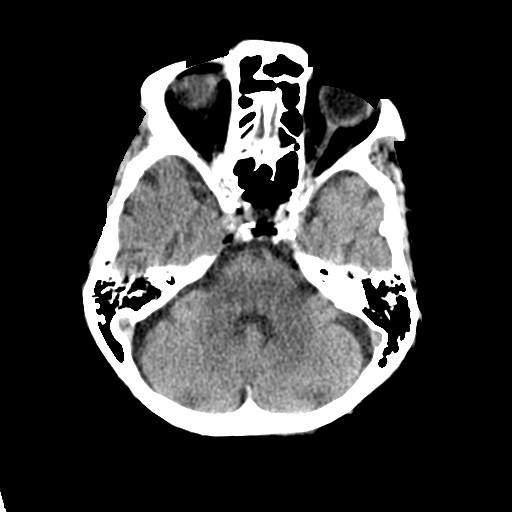
[im 12/34  brain]
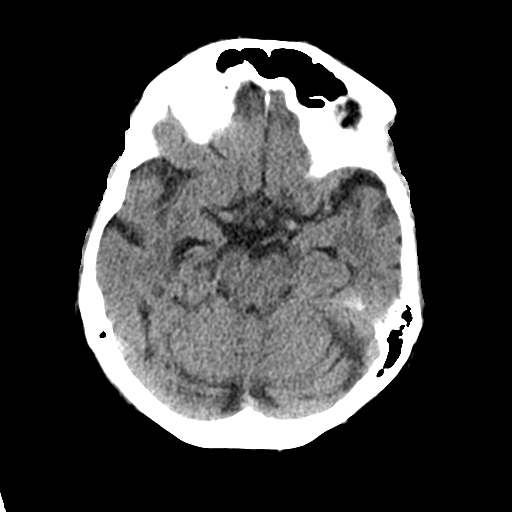
[im 15/34  brain]
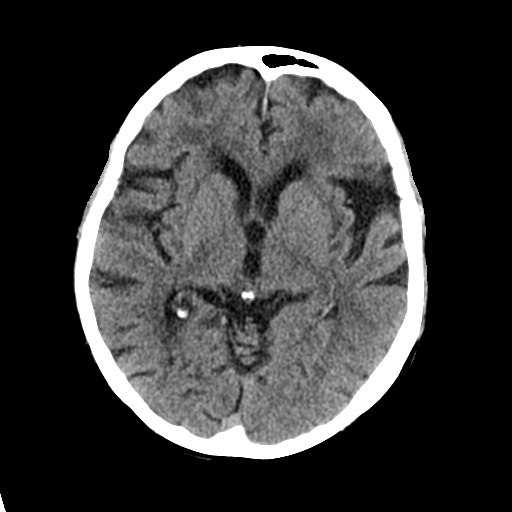
[im 15/34  bone]
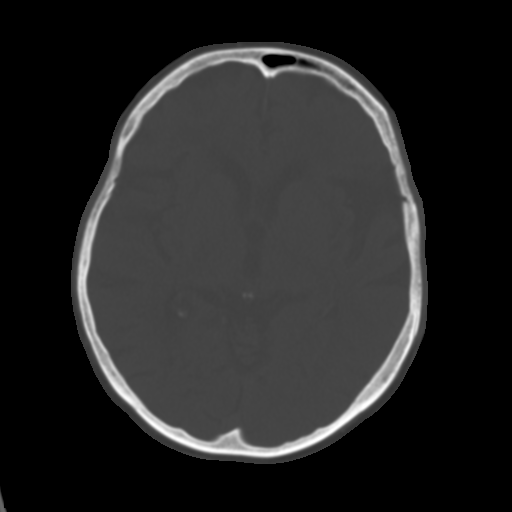
[im 19/34  brain]
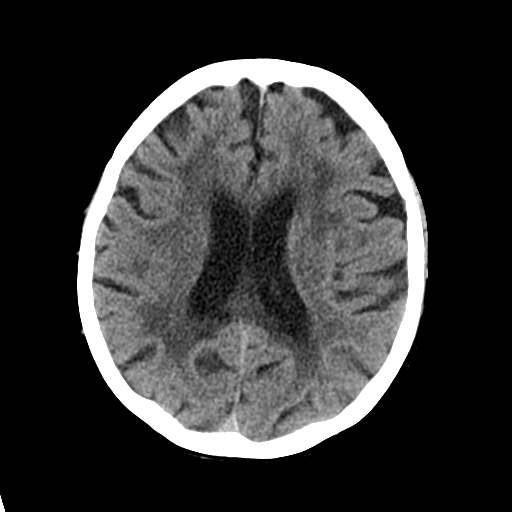
[im 22/34  brain]
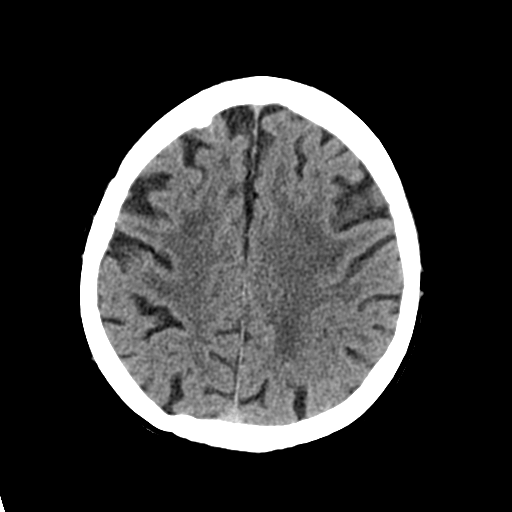
[im 26/34  brain]
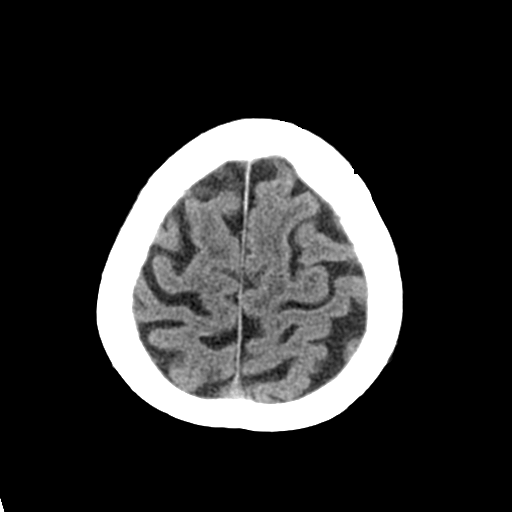
[im 28/34  brain]
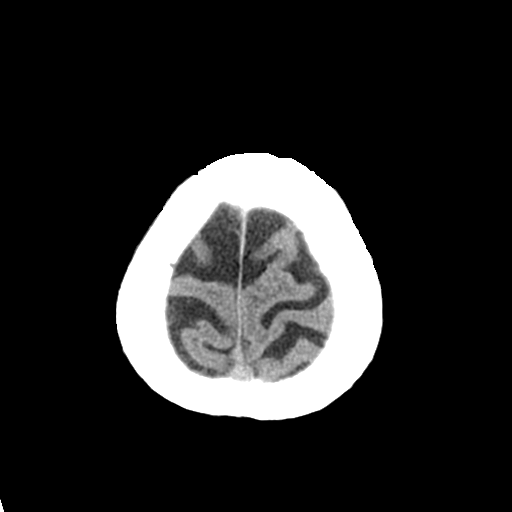
[im 28/34  bone]
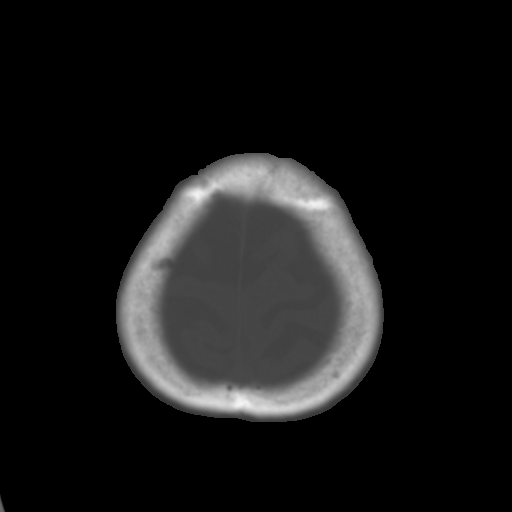
[im 31/34  brain]
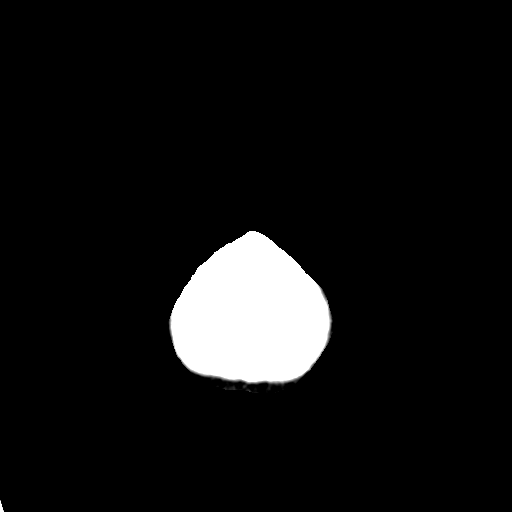

[Series 4: coronal soft tissue · coronal · 0.33mm/px · 3 of 66 slices shown]
[im 22/66  brain]
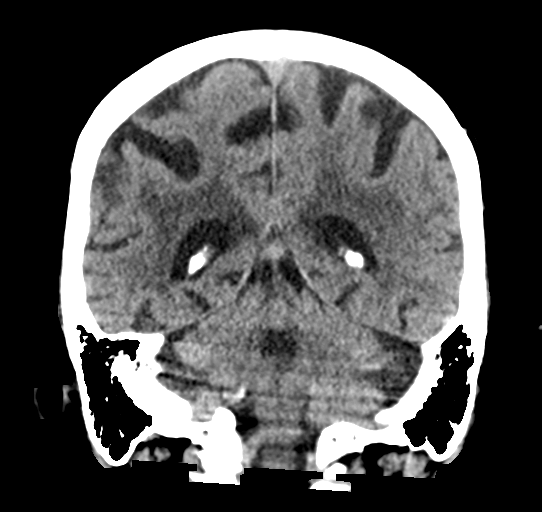
[im 29/66  brain]
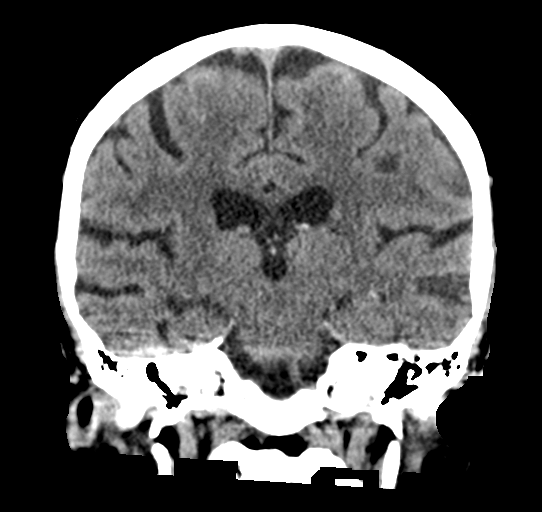
[im 37/66  brain]
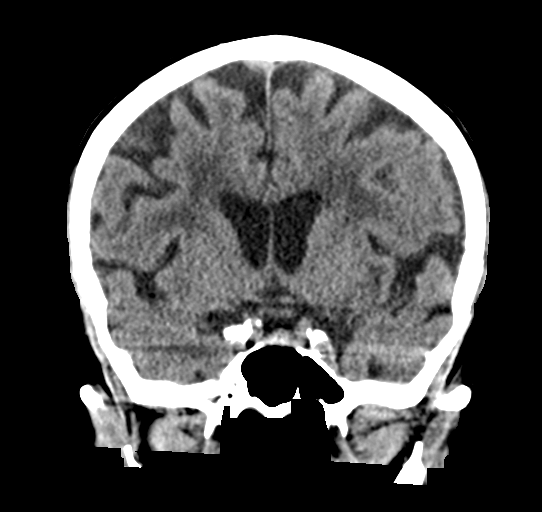

[Series 5: sagittal soft tissue · sagittal · 0.33mm/px · 3 of 60 slices shown]
[im 20/60  brain]
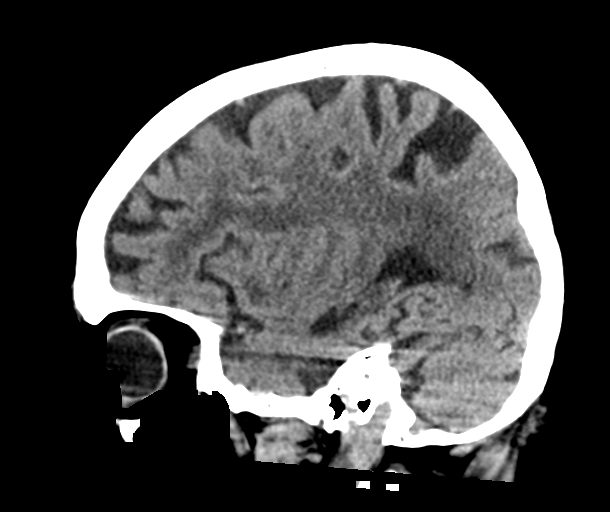
[im 30/60  brain]
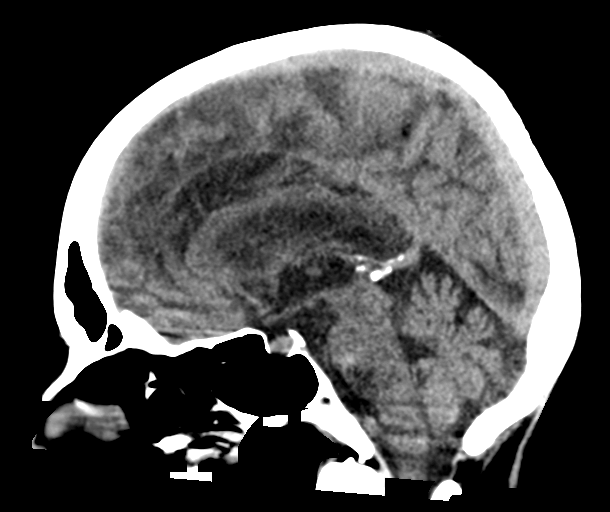
[im 40/60  brain]
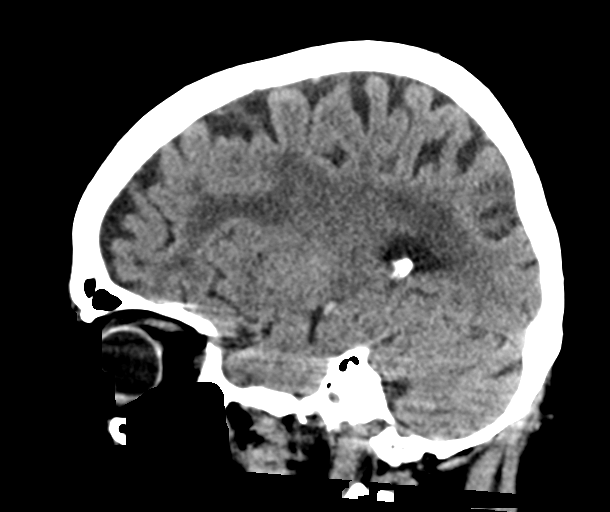

[16 of 47 positions shown; findings below may reference images not displayed]

FINDINGS: Brain: No evidence of acute infarction, hemorrhage, hydrocephalus,
extra-axial collection or mass lesion/mass effect. Signs of atrophy
and chronic microvascular ischemic change are unchanged.

Vascular: No hyperdense vessel or unexpected calcification.

Skull: Normal. Negative for fracture or focal lesion.

Sinuses/Orbits: Visualized paranasal sinuses and orbits are
unremarkable.

Other: None.
IMPRESSION: No acute intracranial pathology.

Signs of atrophy and chronic microvascular ischemic change are
unchanged.

## 2021-10-08 IMAGING — MR MR HEAD W/O CM
12 series · 43 of 48 positions shown · non-contrast
Comparison: No pertinent prior exam.

CLINICAL DATA: TIA

EXAM:
MRI HEAD WITHOUT CONTRAST
MRA HEAD WITHOUT CONTRAST
TECHNIQUE: Multiplanar, multi-echo pulse sequences of the brain and surrounding
structures were acquired without intravenous contrast. Angiographic
images of the Circle of Willis were acquired using MRA technique
without intravenous contrast.

[Series 5: ax dwi_tracew · axial · 3.0mm · 0.65mm/px · z∈[-88,+50]mm · 3 of 44 slices shown]
[im 1/44]
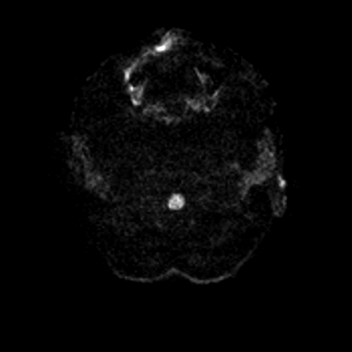
[im 22/44]
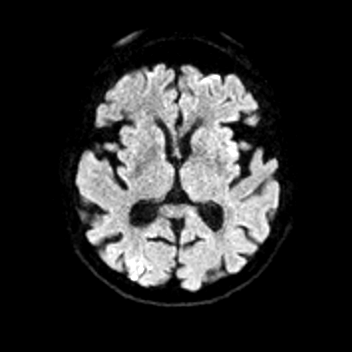
[im 44/44]
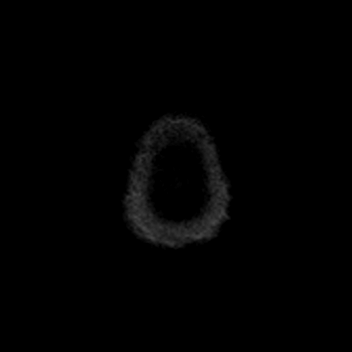

[Series 6: ax dwi_adc · axial · 3.0mm · 0.65mm/px · z∈[-88,+50]mm · 3 of 44 slices shown]
[im 1/44]
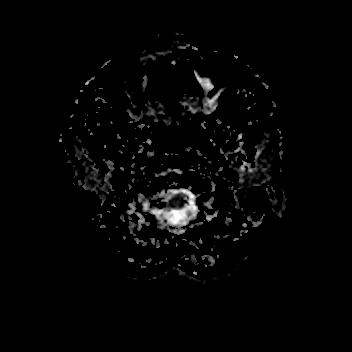
[im 22/44]
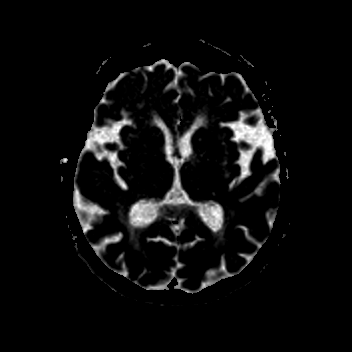
[im 44/44]
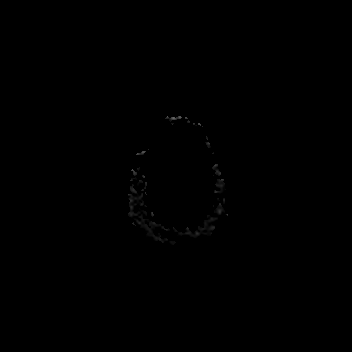

[Series 7: cor dwi_tracew · coronal · 5.0mm · 0.60mm/px · 3 of 32 slices shown]
[im 1/32]
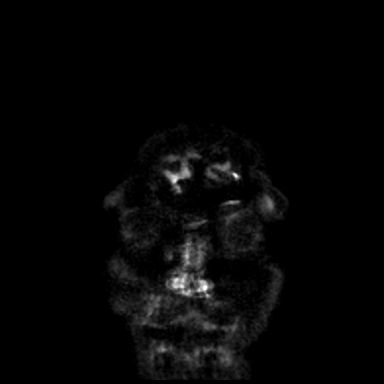
[im 16/32]
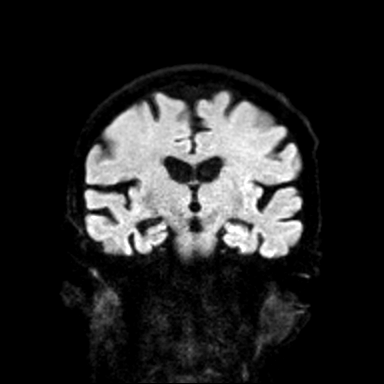
[im 32/32]
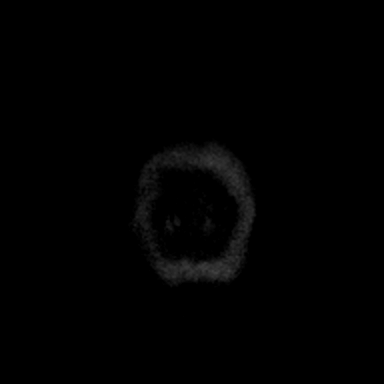

[Series 8: cor dwi_adc · coronal · 5.0mm · 0.60mm/px · 2 of 31 slices shown]
[im 1/31]
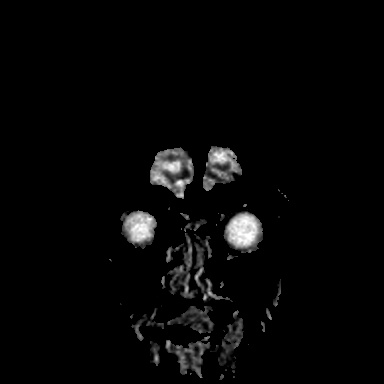
[im 31/31]
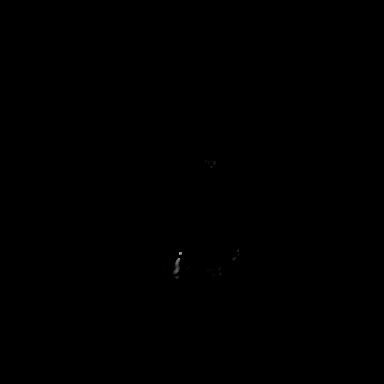

[Series 9: T1 · sagittal · 5.0mm · 0.62mm/px · 2 of 22 slices shown (1 of 2)]
[im 1/22]
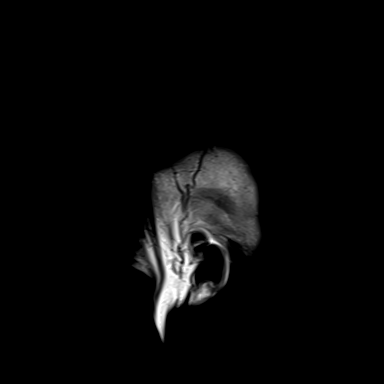
[im 22/22]
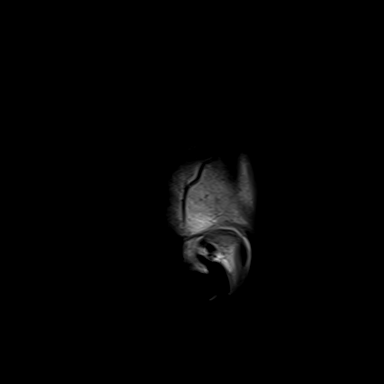

[Series 10: T2 · axial · 5.0mm · 0.53mm/px · z∈[-84,+50]mm · 2 of 24 slices shown (1 of 2)]
[im 1/24]
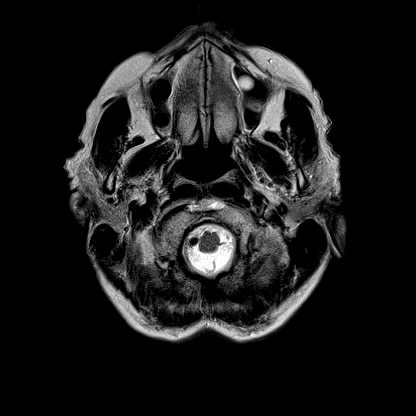
[im 24/24]
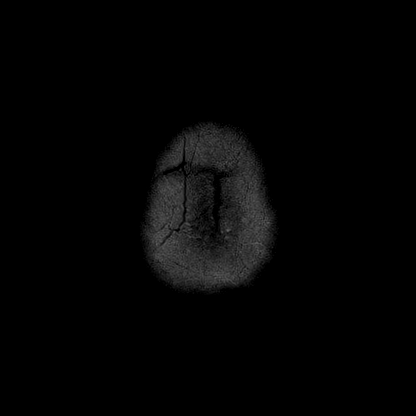

[Series 11: mag_images · axial · 3.0mm · 0.90mm/px · z∈[-103,+68]mm · 5 of 60 slices shown]
[im 1/60]
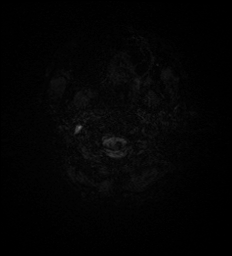
[im 15/60]
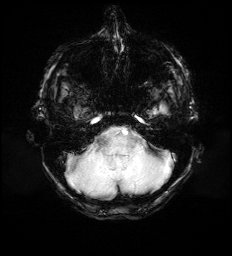
[im 30/60]
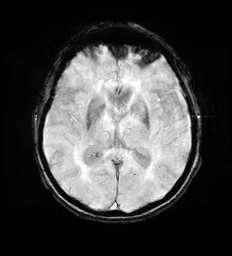
[im 45/60]
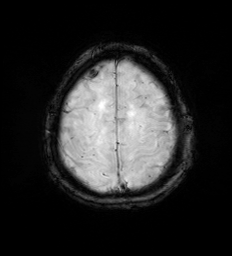
[im 60/60]
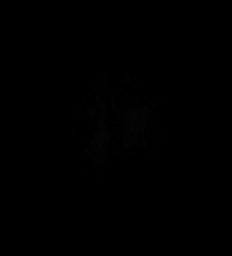

[Series 12: pha_images · axial · 3.0mm · 0.90mm/px · z∈[-103,+68]mm · 5 of 60 slices shown]
[im 1/60]
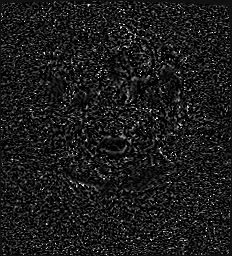
[im 15/60]
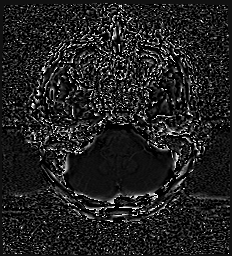
[im 30/60]
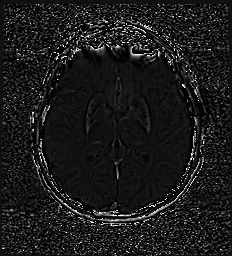
[im 45/60]
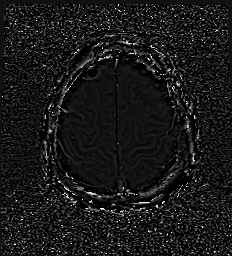
[im 60/60]
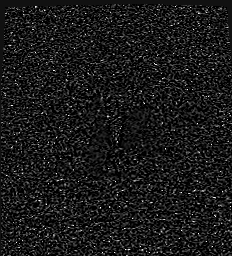

[Series 13: swi_images · axial · 3.0mm · 0.90mm/px · z∈[-103,+25]mm · 4 of 60 slices shown]
[im 1/60]
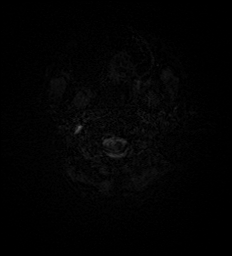
[im 15/60]
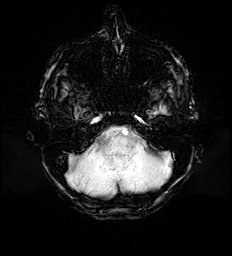
[im 30/60]
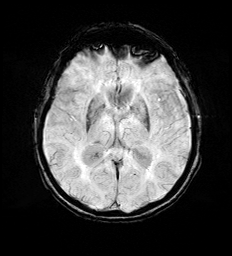
[im 45/60]
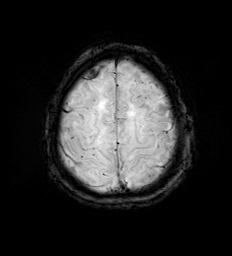

[Series 15: FLAIR · axial · 3.0mm · 0.53mm/px · z∈[-96,+62]mm · 4 of 55 slices shown]
[im 1/55]
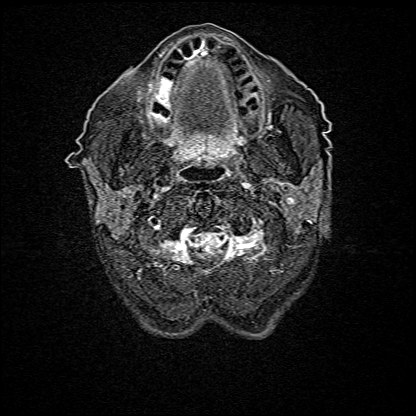
[im 19/55]
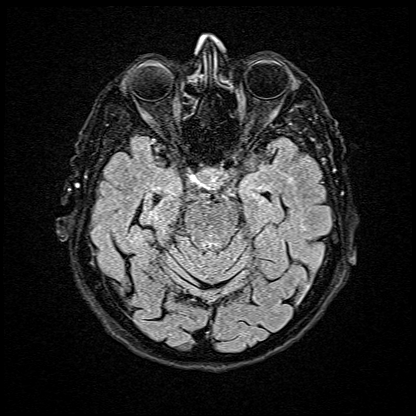
[im 37/55]
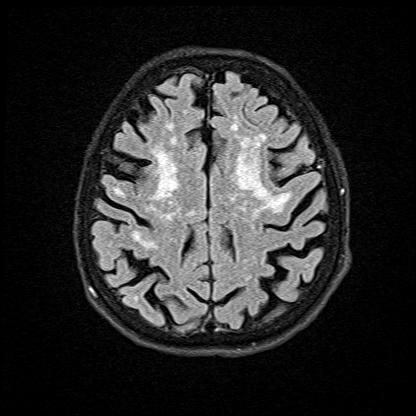
[im 55/55]
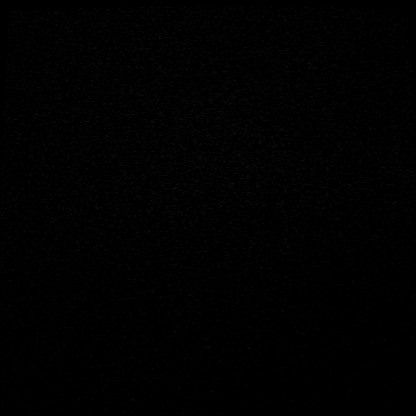

[Series 16: T1 · axial · 1.0mm · 0.98mm/px · z∈[-90,+49]mm · 8 of 144 slices shown (2 of 2)]
[im 1/144]
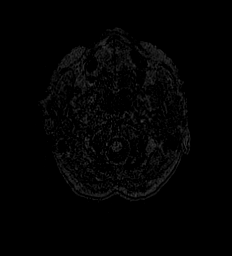
[im 27/144]
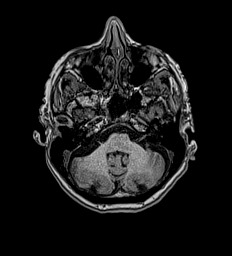
[im 40/144]
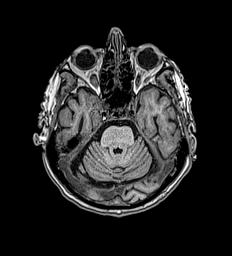
[im 66/144]
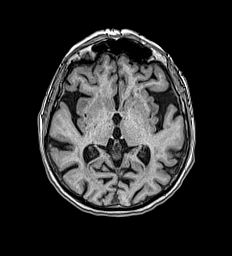
[im 79/144]
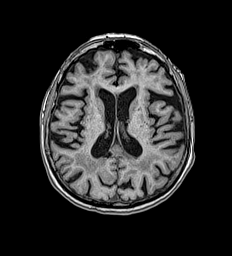
[im 105/144]
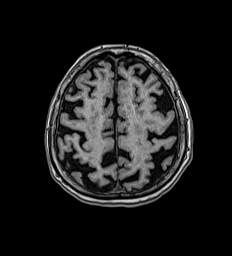
[im 118/144]
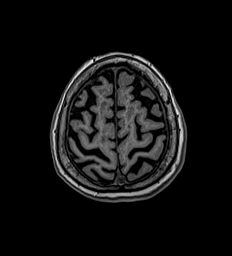
[im 144/144]
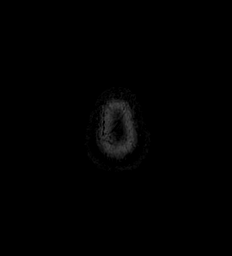

[Series 17: T2 · coronal · 5.0mm · 0.57mm/px · 2 of 24 slices shown (2 of 2)]
[im 1/24]
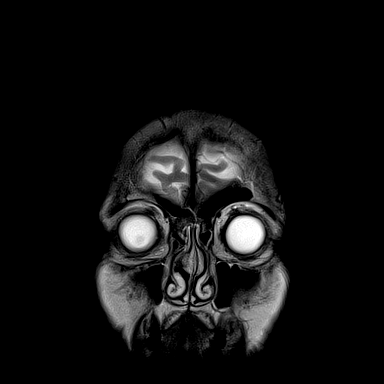
[im 24/24]
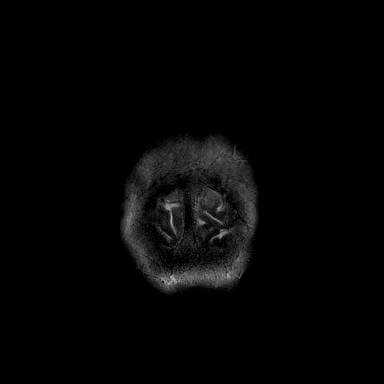

[43 of 48 positions shown; findings below may reference images not displayed]

FINDINGS: MRI HEAD FINDINGS

Brain: Restricted diffusion in the right parietal and occipital lobe
cortex (series 5, images 20-28, with additional punctate areas of
restricted diffusion right in the caudate head (series 5, image 26),
right temporal lobe (image 18), and para falcine posterior right
frontal lobe (image 36).

No acute hemorrhage, mass, mass effect, or midline shift. No
hydrocephalus or extra-axial collection. Confluent T2 hyperintense
signal in the periventricular white matter, likely the sequela of
severe chronic small vessel ischemic disease.

Vascular: See MRA findings below.

Skull and upper cervical spine: Normal marrow signal.

Sinuses/Orbits: Mucous retention cyst in the left maxillary sinus.
Status post bilateral lens replacements.

Other: The mastoids are well aerated.

MRA HEAD FINDINGS

Anterior circulation: Both internal carotid arteries are patent to
the termini, without stenosis or other abnormality. A1 segments
patent. Normal anterior communicating artery. Anterior cerebral
arteries are patent to their distal aspects. No M1 stenosis or
occlusion. Normal MCA bifurcations. Distal MCA branches perfused and
symmetric.

Posterior circulation: The left vertebral artery is hypoplastic or
significantly stenosed. The right vertebral artery is widely patent.
Posterior inferior cerebral arteries patent bilaterally. Basilar
patent to its distal aspect. Superior cerebral arteries patent
bilaterally. The left PCA is well perfused to its distal aspects
without stenosis. Focal stenosis in the right P1. The right
posterior communicating artery is visualized but demonstrates
stenosis distally (series 5, image 99). The junction and proximal
right P2 segment are not opacified (series 5, image 98), with poor
opacification of the remainder of the right PCA, likely severe
stenosis.

Anatomic variants: None significant
IMPRESSION: 1. Restricted diffusion in the right parietal and occipital lobe
cortex, with additional punctate areas of restricted diffusion
throughout the right cerebral hemisphere, likely acute infarcts.
Given multiple vascular territories, an embolic etiology is
suspected.
2. Poor opacification of the proximal right P1 and distal right
posterior communicating artery, with non visualization of the
junction and proximal right P2, with only minimal opacification
distally, likely severe stenosis.
3. Hypoplastic versus significantly stenosed intracranial left
vertebral artery.

These results were called by telephone at the time of interpretation
on [DATE] at [DATE] to provider Dr. EBNER, who verbally
acknowledged these results.

## 2021-10-08 MED ORDER — DM-GUAIFENESIN ER 30-600 MG PO TB12: 1.0000 | ORAL_TABLET | Freq: Two times a day (BID) | ORAL | Status: DC | PRN

## 2021-10-08 MED ORDER — ONDANSETRON HCL 4 MG/2ML IJ SOLN: 4.0000 mg | Freq: Three times a day (TID) | INTRAMUSCULAR | Status: DC | PRN

## 2021-10-08 MED ORDER — HYDRALAZINE HCL 20 MG/ML IJ SOLN: 5.0000 mg | INTRAMUSCULAR | Status: DC | PRN

## 2021-10-08 MED ORDER — ASPIRIN EC 81 MG PO TBEC
81.0000 mg | DELAYED_RELEASE_TABLET | Freq: Every day | ORAL | Status: DC
  Administered 2021-10-09: 81 mg via ORAL
  Filled 2021-10-08: qty 1

## 2021-10-08 MED ORDER — ALBUTEROL SULFATE HFA 108 (90 BASE) MCG/ACT IN AERS
2.0000 | INHALATION_SPRAY | RESPIRATORY_TRACT | Status: DC | PRN
  Filled 2021-10-08: qty 6.7

## 2021-10-08 MED ORDER — CLOPIDOGREL BISULFATE 75 MG PO TABS
75.0000 mg | ORAL_TABLET | Freq: Every day | ORAL | Status: DC
  Administered 2021-10-09: 08:00:00 75 mg via ORAL
  Filled 2021-10-08: qty 1

## 2021-10-08 MED ORDER — ENOXAPARIN SODIUM 30 MG/0.3ML IJ SOSY
30.0000 mg | PREFILLED_SYRINGE | INTRAMUSCULAR | Status: DC
  Administered 2021-10-08: 30 mg via SUBCUTANEOUS
  Filled 2021-10-08: qty 0.3

## 2021-10-08 MED ORDER — ACETAMINOPHEN 650 MG RE SUPP: 650.0000 mg | RECTAL | Status: DC | PRN

## 2021-10-08 MED ORDER — ACETAMINOPHEN 160 MG/5ML PO SOLN
650.0000 mg | ORAL | Status: DC | PRN
  Filled 2021-10-08: qty 20.3

## 2021-10-08 MED ORDER — STROKE: EARLY STAGES OF RECOVERY BOOK: Freq: Once | Status: AC

## 2021-10-08 MED ORDER — VITAMIN B-12 1000 MCG PO TABS
1000.0000 ug | ORAL_TABLET | Freq: Every day | ORAL | Status: DC
  Administered 2021-10-09: 08:00:00 1000 ug via ORAL
  Filled 2021-10-08: qty 1

## 2021-10-08 MED ORDER — SENNOSIDES-DOCUSATE SODIUM 8.6-50 MG PO TABS
1.0000 | ORAL_TABLET | Freq: Every evening | ORAL | Status: DC | PRN
Start: 2021-10-08 — End: 2021-10-09

## 2021-10-08 MED ORDER — ROSUVASTATIN CALCIUM 10 MG PO TABS
5.0000 mg | ORAL_TABLET | Freq: Every day | ORAL | Status: DC
  Administered 2021-10-09: 08:00:00 5 mg via ORAL
  Filled 2021-10-08: qty 1

## 2021-10-08 MED ORDER — ACETAMINOPHEN 325 MG PO TABS: 650.0000 mg | ORAL_TABLET | ORAL | Status: DC | PRN

## 2021-10-08 NOTE — ED Provider Notes (Signed)
Emergency Medicine Provider Triage Evaluation Note  FIDELA CIESLAK , a 80 y.o. female  was evaluated in triage.  Pt complains of left arm numbness and left lip numbness that started about 45 minutes ago. Symptoms have resolved.   Review of Systems  Positive: Numbness in left arm, lip Negative: Headache  Physical Exam  BP (!) 166/85   Pulse 93   Temp 97.9 F (36.6 C) (Oral)   Resp 18   Ht 5\' 8"  (1.727 m)   Wt 64.4 kg   SpO2 95%   BMI 21.59 kg/m  Gen:   Awake, no distress   Resp:  Normal effort  MSK:   Moves extremities without difficulty  Other:    Medical Decision Making  Medically screening exam initiated at 2:47 PM.  Appropriate orders placed.  [name] was informed that the remainder of the evaluation will be completed by another provider, this initial triage assessment does not replace that evaluation, and the importance of remaining in the ED until their evaluation is complete.   Victorino Dike, FNP 10/08/21 1448    Naaman Plummer, MD 10/09/21 1245

## 2021-10-08 NOTE — H&P (Addendum)
History and Physical    Sara Aguirre:[id number] DOB: October 15, 1941 DOA: 10/08/2021  Referring MD/NP/PA:   PCP: Pleas Koch, NP   Patient coming from:  The patient is coming from home.  At baseline, pt is independent for most of ADL.        Chief Complaint: left arm heaviness  HPI: Sara Aguirre is a 80 y.o. female with medical history significant of stroke with mild balance issue, hypertension, hyperlipidemia, gastric ulcer, bladder cancer (s/p of cystectomy with ileal conduit), incarcerated ventral hernia repair, former smoker, s/p of  repair of parastomal hernia of ileal conduit, who present with left arm heaviness.  Patient states she had a 1 episode of left arm heaviness at about 1:15 PM.  Symptoms lasted for about 45 minutes then resolved spontaneously.  She states that she has mild left finger numbness from a previous stroke, which has not changed.  No slurred speech, facial droop.  No leg weakness or numbness. Patient does not have chest pain, cough, shortness breath, fever or chills.  No nausea vomiting, diarrhea or abdominal pain.  No symptoms of UTI.  ED Course: pt was found to have WBC 8.8, troponin level 6, pending COVID-19 PCR, slightly worsening renal function, temperature normal, blood pressure 189/69, heart rate 65, RR 18, oxygen saturation 95% on room air.  CT of head is negative for acute intracranial abnormality. Consulted Dr. Curly Shores of neuro.  Review of Systems:   General: no fevers, chills, no body weight gain, has fatigue HEENT: no blurry vision, hearing changes or sore throat Respiratory: no dyspnea, coughing, wheezing CV: no chest pain, no palpitations GI: no nausea, vomiting, abdominal pain, diarrhea, constipation.  GU: no dysuria, burning on urination, increased urinary frequency, hematuria  Ext: no leg edema Neuro:  no vision change or hearing loss. Has left arm heaviness and chronic right hand finger numbness Skin: no rash, no skin tear. MSK: No  muscle spasm, no deformity, no limitation of range of movement in spin Heme: No easy bruising.  Travel history: No recent long distant travel.  Allergy:  Allergies  Allergen Reactions   Imodium [Loperamide]     Itchy all over   Loperamide Hcl Rash    Past Medical History:  Diagnosis Date   Cancer ([place])    bladder, s/p cystectomy with ileal conduit   History of bladder cancer    History of tobacco use    Hyperlipidemia    Hypertension    Incarcerated ventral hernia, s/p lap repair 12/05/2011   Parastomal hernia of ileal conduit, s/p lap repair [id number] 12/04/2011   Ulcer, gastric, acute or chronic     Past Surgical History:  Procedure Laterality Date   BLADDER REMOVAL  10/1993   Dr. Risa Grill   HERNIA REPAIR  12/05/11   parastomal   LOOP RECORDER INSERTION N/A 04/23/2020   Procedure: LOOP RECORDER INSERTION;  Surgeon: Deboraha Sprang, MD;  Location: [facility];  Service: Cardiovascular;  Laterality: N/A;   PARASTOMAL HERNIA REPAIR  12/05/2011   Procedure: HERNIA REPAIR PARASTOMAL;  Surgeon: Adin Hector, MD;  Location: [facility];  Service: General;;  Laprascopic lysis of adhestons with reduction and repair with biological mesh for incarcerated parastomal and ventral long incisional hernia.   REVISION UROSTOMY CUTANEOUS     VENTRAL HERNIA REPAIR  12/05/2011   Procedure: HERNIA REPAIR VENTRAL ADULT;  Surgeon: Adin Hector, MD;  Location: [facility];  Service: General;;    Social History:  reports that  she quit smoking about 28 years ago. She has never used smokeless tobacco. She reports that she does not drink alcohol and does not use drugs.  Family History:  Family History  Problem Relation Age of Onset   Heart disease Mother 10       CHF   Alcohol abuse Father    Stroke Father      Prior to Admission medications   Medication Sig Start Date End Date Taking? Authorizing Provider  amLODipine (NORVASC) 5 MG tablet Take 1 tablet (5 mg total) by mouth daily. For blood  pressure. 08/25/21   Pleas Koch, NP  Blood Pressure Monitor DEVI Use to check blood pressure 3 times a week 10/27/19   Forrest Moron, MD  clopidogrel (PLAVIX) 75 MG tablet Take 1 tablet (75 mg total) by mouth daily. For stroke 08/12/21   Pleas Koch, NP  cyanocobalamin 1000 MCG tablet Take 1,000 mcg by mouth daily.    [provider]  lisinopril-hydrochlorothiazide (ZESTORETIC) 20-25 MG tablet Take 1 tablet by mouth daily. For blood pressure. 08/12/21   Pleas Koch, NP  rosuvastatin (CRESTOR) 5 MG tablet Take 1 tablet (5 mg total) by mouth daily. For cholesterol. 08/13/21 08/13/22  Pleas Koch, NP  UNABLE TO FIND HOLISTER UROSTOMY POUCHES ITEM [phone] 10/31/18   Forrest Moron, MD    Physical Exam: Vitals:   10/08/21 1630 10/08/21 1700 10/08/21 1723 10/08/21 1804  BP: (!) 162/102 (!) 175/67  (!) 166/66  Pulse: (!) 54 (!) 59  (!) 57  Resp: 10 16  17   Temp:   98.1 F (36.7 C) 98.2 F (36.8 C)  TempSrc:   Oral Oral  SpO2: 99% 99%  100%  Weight:      Height:       General: Not in acute distress HEENT:       Eyes: PERRL, EOMI, no scleral icterus.       ENT: No discharge from the ears and nose, no pharynx injection, no tonsillar enlargement.        Neck: No JVD, no bruit, no mass felt. Heme: No neck lymph node enlargement. Cardiac: S1/S2, RRR, No murmurs, No gallops or rubs. Respiratory: No rales, wheezing, rhonchi or rubs. GI: Soft, nondistended, nontender, no rebound pain, no organomegaly, BS present. Has ileal conduit bag in place GU: No hematuria Ext: No pitting leg edema bilaterally. 1+DP/PT pulse bilaterally. Musculoskeletal: No joint deformities, No joint redness or warmth, no limitation of ROM in spin. Skin: No rashes.  Neuro: Alert, oriented X3, cranial nerves II-XII grossly intact, moves all extremities normally. Muscle strength 5/5 in all extremities, sensation to light touch intact.  Psych: Patient is not psychotic, no suicidal or hemocidal  ideation.  Labs on Admission: I have personally reviewed following labs and imaging studies  CBC: Recent Labs  Lab 10/08/21 1442  WBC 8.8  NEUTROABS 7.0  HGB 13.7  HCT 41.4  MCV 92.8  PLT 182   Basic Metabolic Panel: Recent Labs  Lab 10/08/21 1442  NA 139  K 4.2  CL 101  CO2 29  GLUCOSE 138*  BUN 18  CREATININE 1.54*  CALCIUM 9.7   GFR: Estimated Creatinine Clearance: 28.4 mL/min (A) (by C-G formula based on SCr of 1.54 mg/dL (H)). Liver Function Tests: Recent Labs  Lab 10/08/21 1442  AST 20  ALT 10  ALKPHOS 59  BILITOT 0.7  PROT 7.4  ALBUMIN 3.8   No results for input(s): LIPASE, AMYLASE in the last 168  hours. No results for input(s): AMMONIA in the last 168 hours. Coagulation Profile: Recent Labs  Lab 10/08/21 1825  INR 1.1   Cardiac Enzymes: No results for input(s): CKTOTAL, CKMB, CKMBINDEX, TROPONINI in the last 168 hours. BNP (last 3 results) No results for input(s): PROBNP in the last 8760 hours. HbA1C: No results for input(s): HGBA1C in the last 72 hours. CBG: Recent Labs  Lab 10/08/21 1445  GLUCAP 146*   Lipid Profile: No results for input(s): CHOL, HDL, LDLCALC, TRIG, CHOLHDL, LDLDIRECT in the last 72 hours. Thyroid Function Tests: No results for input(s): TSH, T4TOTAL, FREET4, T3FREE, THYROIDAB in the last 72 hours. Anemia Panel: No results for input(s): VITAMINB12, FOLATE, FERRITIN, TIBC, IRON, RETICCTPCT in the last 72 hours. Urine analysis:    Component Value Date/Time   COLORURINE YELLOW 04/21/2020 1916   APPEARANCEUR CLOUDY (A) 04/21/2020 1916   LABSPEC 1.016 04/21/2020 1916   PHURINE 7.0 04/21/2020 1916   GLUCOSEU NEGATIVE 04/21/2020 1916   HGBUR NEGATIVE 04/21/2020 1916   BILIRUBINUR NEGATIVE 04/21/2020 1916   KETONESUR NEGATIVE 04/21/2020 1916   PROTEINUR 100 (A) 04/21/2020 1916   UROBILINOGEN 0.2 12/13/2012 2155   NITRITE NEGATIVE 04/21/2020 1916   LEUKOCYTESUR LARGE (A) 04/21/2020 1916   Sepsis  Labs: @LABRCNTIP (procalcitonin:4,lacticidven:4) ) Recent Results (from the past 240 hour(s))  Resp Panel by RT-PCR (Flu A&B, Covid) Nasopharyngeal Swab     Status: Abnormal   Collection Time: 10/08/21  4:30 PM   Specimen: Nasopharyngeal Swab; Nasopharyngeal(NP) swabs in vial transport medium  Result Value Ref Range Status   SARS Coronavirus 2 by RT PCR POSITIVE (A) NEGATIVE Final    Comment: RESULT CALLED TO, READ BACK BY AND VERIFIED WITH: C/ROBIN THOMAS AT 1745 10/08/21.PMF (NOTE) SARS-CoV-2 target nucleic acids are DETECTED.  The SARS-CoV-2 RNA is generally detectable in upper respiratory specimens during the acute phase of infection. Positive results are indicative of the presence of the identified virus, but do not rule out bacterial infection or co-infection with other pathogens not detected by the test. Clinical correlation with patient history and other diagnostic information is necessary to determine patient infection status. The expected result is Negative.  Fact Sheet for Patients: EntrepreneurPulse.com.au  Fact Sheet for Healthcare Providers: IncredibleEmployment.be  This test is not yet approved or cleared by the [place] FDA and  has been authorized for detection and/or diagnosis of SARS-CoV-2 by FDA under an Emergency Use Authorization (EUA).  This EUA will remain in effect (meaning this test can  be used) for the duration of  the COVID-19 declaration under Section 564(b)(1) of the Act, 21 U.S.C. section 360bbb-3(b)(1), unless the authorization is terminated or revoked sooner.     Influenza A by PCR NEGATIVE NEGATIVE Final   Influenza B by PCR NEGATIVE NEGATIVE Final    Comment: (NOTE) The Xpert Xpress SARS-CoV-2/FLU/RSV plus assay is intended as an aid in the diagnosis of influenza from Nasopharyngeal swab specimens and should not be used as a sole basis for treatment. Nasal washings and aspirates are unacceptable for  Xpert Xpress SARS-CoV-2/FLU/RSV testing.  Fact Sheet for Patients: EntrepreneurPulse.com.au  Fact Sheet for Healthcare Providers: IncredibleEmployment.be  This test is not yet approved or cleared by the [place] FDA and has been authorized for detection and/or diagnosis of SARS-CoV-2 by FDA under an Emergency Use Authorization (EUA). This EUA will remain in effect (meaning this test can be used) for the duration of the COVID-19 declaration under Section 564(b)(1) of the Act, 21 U.S.C. section 360bbb-3(b)(1), unless the authorization is  terminated or revoked.  Performed at [facility], [address], [place], [place] [address]      Radiological Exams on Admission: CT Head Wo Contrast  Result Date: 10/08/2021 CLINICAL DATA:  An 80 year old female presents with symptoms of TIA. EXAM: CT HEAD WITHOUT CONTRAST TECHNIQUE: Contiguous axial images were obtained from the base of the skull through the vertex without intravenous contrast. COMPARISON:  Comparison made with Apr 21, 2020. FINDINGS: Brain: No evidence of acute infarction, hemorrhage, hydrocephalus, extra-axial collection or mass lesion/mass effect. Signs of atrophy and chronic microvascular ischemic change are unchanged. Vascular: No hyperdense vessel or unexpected calcification. Skull: Normal. Negative for fracture or focal lesion. Sinuses/Orbits: Visualized paranasal sinuses and orbits are unremarkable. Other: None. IMPRESSION: No acute intracranial pathology. Signs of atrophy and chronic microvascular ischemic change are unchanged. Electronically Signed   By: Zetta Bills M.D.   On: 10/08/2021 15:07     EKG: I have personally reviewed.  Seems to be sinus rhythm, QTC 423, with artificial effects   Assessment/Plan  Principal Problem:   TIA (transient ischemic attack) Active Problems:   HTN (hypertension)   History of CVA (cerebrovascular accident)   CKD (chronic kidney  disease), stage IIIb   Hyperlipidemia   COVID-19 virus infection  TIA (transient ischemic attack) and hx of CVA (cerebrovascular accident): CT-head negative.  Her symptoms are consistent with TIA. Dr. Curly Shores of neuro is consulted  -Placed on MedSurg bed for observation - Obtain MRI/MRA  - will hold oral Bp meds to allow permissive HTN in the setting of acute stroke, for SBP>220 or dBP>120 - Check carotid dopplers  - Continue Plavix and add ASA   - fasting lipid panel and HbA1c  - 2D transthoracic echocardiography  - swallowing screen. If fails, will get SLP - PT/OT consult  HTN (hypertension) -prn IV hydralazine for SBP>220 or dBP>120  CKD (chronic kidney disease), stage IIIb: Baseline creatinine 1.3-1.4.  Her creatinine is 1.54, BUN 18, close to baseline. -Follow-up with BMP  Hyperlipidemia -Crestor  COVID-19 infection: Patient is asymptomatic, no treatment needed. -As needed albuterol   DVT ppx: SQ Lovenox Code Status: DNR per pt and her son Family Communication:  Yes, patient's son  at bed side Disposition Plan:  Anticipate discharge back to previous environment Consults called:  message sent to Dr. Curly Shores of neuro Admission status and Level of care: Med-Surg:   for obs    Status is: Observation  The patient remains OBS appropriate and will d/c before 2 midnights.          Date of Service 10/08/2021    Ivor Costa [facility]   If 7PM-7AM, please contact night-coverage [email or web address] 10/08/2021, 7:44 PM

## 2021-10-08 NOTE — Progress Notes (Signed)
Verified with on call ID MD via telephone call (Dr Juleen China) that the pt does not need to be on Airborne and Contact Isolation for Covid even though the pt's Covid swab came back positive on 10/08/2021 at 1630 her CT value is 34.0 (after clarification via telephone call with the lab); Dr Juleen China also asked what the pt's admitting diagnosis is, advised him TIA and the pt verbalized that she was initially diagnosed 2.5 weeks ago

## 2021-10-08 NOTE — ED Triage Notes (Signed)
Pt comes pov after getting left arm numbness and left lip numbness about 45 mins ago. Pt states her symptoms have since resolved. Able to move left arm around, no facial droop, grips equal. Hx of stroke.

## 2021-10-08 NOTE — Consult Note (Signed)
Neurology Consultation  Reason for Consult: TIA Requesting Physician: Nicole Kindred  CC: c/f TIA  History is obtained from: Patient and chart review  HPI: ZILLAH ALEXIE is a 80 y.o. female with a past medical history significant for hypertension, hyperlipidemia, CKD stage IV, bladder cancer s/p colectomy with ileal conduit, gastric ulcer, right PCA infarcts (06/5642, embolic secondary to unknown source though with significant fetal right PCA stenosis other intracranial atherosclerotic disease, s/p loop recorder, residual left upper quadrantanopsia and decreased left sensation), elevated free kappa/lambda light chains,  She presented on 10/22 for worsening left upper extremity weakness  LKW: 1:15 PM on 10/22 tPA given?: No, symptoms felt to have fully resolved by the time of her evaluation IA performed?: No, exam not consistent with LVO Premorbid modified rankin scale:      1 - No significant disability. Able to carry out all usual activities, despite some symptoms.  ROS: All other review of systems was negative except as noted in the HPI.   Past Medical History:  Diagnosis Date   Cancer [facility])    bladder, s/p cystectomy with ileal conduit   History of bladder cancer    History of tobacco use    Hyperlipidemia    Hypertension    Incarcerated ventral hernia, s/p lap repair 12/05/2011   Parastomal hernia of ileal conduit, s/p lap repair [id number] 12/04/2011   Ulcer, gastric, acute or chronic    Past Surgical History:  Procedure Laterality Date   BLADDER REMOVAL  10/1993   Dr. Risa Grill   HERNIA REPAIR  12/05/11   parastomal   LOOP RECORDER INSERTION N/A 04/23/2020   Procedure: LOOP RECORDER INSERTION;  Surgeon: Deboraha Sprang, MD;  Location: [facility];  Service: Cardiovascular;  Laterality: N/A;   PARASTOMAL HERNIA REPAIR  12/05/2011   Procedure: HERNIA REPAIR PARASTOMAL;  Surgeon: Adin Hector, MD;  Location: [facility];  Service: General;;  Laprascopic lysis of adhestons  with reduction and repair with biological mesh for incarcerated parastomal and ventral long incisional hernia.   REVISION UROSTOMY CUTANEOUS     VENTRAL HERNIA REPAIR  12/05/2011   Procedure: HERNIA REPAIR VENTRAL ADULT;  Surgeon: Adin Hector, MD;  Location: [facility];  Service: General;;    Current Facility-Administered Medications:    acetaminophen (TYLENOL) tablet 650 mg, 650 mg, Oral, Q4H PRN **OR** acetaminophen (TYLENOL) 160 MG/5ML solution 650 mg, 650 mg, Per Tube, Q4H PRN **OR** acetaminophen (TYLENOL) suppository 650 mg, 650 mg, Rectal, Q4H PRN, Ivor Costa, MD   albuterol (VENTOLIN HFA) 108 (90 Base) MCG/ACT inhaler 2 puff, 2 puff, Inhalation, Q4H PRN, Ivor Costa, MD   aspirin EC tablet 81 mg, 81 mg, Oral, Daily, Ivor Costa, MD, 81 mg at 10/09/21 0755   clopidogrel (PLAVIX) tablet 75 mg, 75 mg, Oral, Daily, Ivor Costa, MD, 75 mg at 10/09/21 0755   dextromethorphan-guaiFENesin ([place] DM) 30-600 MG per 12 hr tablet 1 tablet, 1 tablet, Oral, BID PRN, Ivor Costa, MD   enoxaparin (LOVENOX) injection 30 mg, 30 mg, Subcutaneous, Q24H, Ivor Costa, MD, 30 mg at 10/08/21 2153   hydrALAZINE (APRESOLINE) injection 5 mg, 5 mg, Intravenous, Q2H PRN, Ivor Costa, MD   ondansetron [facility]) injection 4 mg, 4 mg, Intravenous, Q8H PRN, Ivor Costa, MD   rosuvastatin (CRESTOR) tablet 5 mg, 5 mg, Oral, Daily, Ivor Costa, MD, 5 mg at 10/09/21 0755   senna-docusate (Senokot-S) tablet 1 tablet, 1 tablet, Oral, QHS PRN, Ivor Costa, MD   vitamin B-12 (CYANOCOBALAMIN) tablet 1,000 mcg, 1,000  mcg, Oral, Daily, Ivor Costa, MD, 1,000 mcg at 10/09/21 1914  Current Outpatient Medications  Medication Instructions   amLODipine (NORVASC) 5 mg, Oral, Daily, For blood pressure.   aspirin EC 81 mg, Oral, Daily, Swallow whole.   Blood Pressure Monitor DEVI Use to check blood pressure 3 times a week   clopidogrel (PLAVIX) 75 mg, Oral, Daily, For stroke   cyanocobalamin 1,000 mcg, Oral, Daily    lisinopril-hydrochlorothiazide (ZESTORETIC) 20-25 MG tablet 1 tablet, Oral, Daily, For blood pressure.   losartan (COZAAR) 100 mg, Oral, Daily   rosuvastatin (CRESTOR) 5 mg, Oral, Daily, For cholesterol.   UNABLE TO FIND HOLISTER UROSTOMY POUCHES ITEM [id number]     Family History  Problem Relation Age of Onset   Heart disease Mother 53       CHF   Alcohol abuse Father    Stroke Father     Social History:  reports that she quit smoking about 28 years ago. She has never used smokeless tobacco. She reports that she does not drink alcohol and does not use drugs.  Exam: Current vital signs: BP (!) 166/66 (BP Location: Left Arm)   Pulse (!) 57   Temp 98.2 F (36.8 C) (Oral)   Resp 17   Ht 5\' 8"  (1.727 m)   Wt 64.4 kg   SpO2 100%   BMI 21.59 kg/m  Vital signs in last 24 hours: Temp:  [97.9 F (36.6 C)-98.2 F (36.8 C)] 98.2 F (36.8 C) (10/22 1804) Pulse Rate:  [54-93] 57 (10/22 1804) Resp:  [10-18] 17 (10/22 1804) BP: (162-189)/(66-102) 166/66 (10/22 1804) SpO2:  [95 %-100 %] 100 % (10/22 1804) Weight:  [64.4 kg] 64.4 kg (10/22 1442)   Physical Exam  Constitutional: Appears well-developed and well-nourished.  Psych: Affect appropriate to situation, calm and cooperative Eyes: No scleral injection HENT: No oropharyngeal obstruction.  MSK: no joint deformities.  Cardiovascular: Normal rate and regular rhythm.  Respiratory: Effort normal, non-labored breathing GI: Soft.  No distension. There is no tenderness.  Skin: Warm dry and intact visible skin  Neuro: Mental Status: Patient is awake, alert, oriented to person, place, month, year, and situation. Patient is able to give some history but mostly states that she is fine at this time No signs of aphasia or neglect, she does have mildly poor attention Cranial Nerves: II: Visual Fields are notable for left hemanopia. Pupils are equal, round, and reactive to light.   III,IV, VI: EOMI without ptosis or diploplia.  V: Facial  sensation is symmetric to temperature VII: Facial movement is symmetric.  VIII: hearing is intact to voice X: Uvula elevates symmetrically XI: Shoulder shrug is symmetric. XII: tongue is midline without atrophy or fasciculations.  Motor: Tone is normal. Bulk is normal. 5/5 strength was present in all four extremities.  Sensory: Sensation is symmetric to light touch and temperature in the arms and legs. Deep Tendon Reflexes: 2+ and symmetric in the biceps and patellae, 3+ and symmetric brachioradialis.  Cerebellar: FNF and HKS are intact bilaterally  NIHSS total 4 Score breakdown: 2 points for left hemianopia (previously upper quadrantanopia), one-point for left nasolabial fold flattening, one-point for some partial numbness in the left hand (chronic)    I have reviewed labs in epic and the results pertinent to this consultation are:   Basic Metabolic Panel: Recent Labs  Lab 10/08/21 1442  NA 139  K 4.2  CL 101  CO2 29  GLUCOSE 138*  BUN 18  CREATININE 1.54*  CALCIUM 9.7  CBC: Recent Labs  Lab 10/08/21 1442  WBC 8.8  NEUTROABS 7.0  HGB 13.7  HCT 41.4  MCV 92.8  PLT 264    Coagulation Studies: Recent Labs    10/08/21 1825  LABPROT 13.8  INR 1.1     Lab Results  Component Value Date   HGBA1C 5.6 08/11/2021   Lab Results  Component Value Date   CHOL 122 10/09/2021   HDL 61 10/09/2021   LDLCALC 46 10/09/2021   TRIG 73 10/09/2021   CHOLHDL 2.0 10/09/2021     I have reviewed the images obtained:  MRI brain and MRA head personally reviewed, agree with radiology: 1. Restricted diffusion in the right parietal and occipital lobe cortex, with additional punctate areas of restricted diffusion throughout the right cerebral hemisphere, likely acute infarcts. Given multiple vascular territories, an embolic etiology is suspected. 2. Poor opacification of the proximal right P1 and distal right posterior communicating artery, with non visualization of  the junction and proximal right P2, with only minimal opacification distally, likely severe stenosis. [Known fetal right PCA] 3. 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  Carotid ultrasound: 1. Right carotid artery system: Greater than 69% but less than near occlusion secondary to moderate multifocal atherosclerotic plaque formation, most prominent about the proximal ICA. 2. Left carotid artery system: Less than 50% stenosis secondary to mild multifocal atherosclerotic plaque formation. 3.  Vertebral artery system: Patent with antegrade flow bilaterally.  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  I do believe her visual field deficit has worsened based on prior documentation and her examination today, which does match her MRI findings of a occipital stroke. Appreciate vascular surgery's evaluation for urgent surgical intervention to minimize her risk of stroke.  Atrial fibrillation as an etiology could be definitively ruled out by interrogation of her loop recorder, but this does not need to hold her discharge today given I think the etiology is most likely atheroembolic and she is not complaining of any new cardiac complaints  Recommendations: - Continue home DAPT  - Risk factor modification - Telemetry monitoring; loop recorder interrogation - Echocardiogram read to be followed up by PCP, outpatient cardiologist, and neurologist - Blood pressure goal              - Gradually normalize BP, start a single agent on discharge, with step wise reintroduction of home meds on PCP follow-up -Vascular surgery consult for symptomatic right internal carotid artery stenosis, appreciate consideration of urgent revascularization - Appreciate  management of COVID19 positive result, elevated urinary light chains, and other co-morbidities per primary team  Lesleigh Noe MD-PhD [facility] [phone] [facility] coverage for [facility] is from 8 AM to 4 AM in-house and 4 PM to 8 PM by telephone/video. 8 PM to 8 AM emergent questions or overnight urgent questions should be addressed to Teleneurology On-call or [facility] neurohospitalist; contact information can be found on AMION

## 2021-10-08 NOTE — Plan of Care (Signed)
?  Problem: Education: ?Goal: Knowledge of disease or condition will improve ?Outcome: Progressing ?Goal: Knowledge of secondary prevention will improve (SELECT ALL) ?Outcome: Progressing ?Goal: Knowledge of patient specific risk factors will improve (INDIVIDUALIZE FOR PATIENT) ?Outcome: Progressing ?  ?

## 2021-10-08 NOTE — ED Provider Notes (Addendum)
[facility] Emergency Department Provider Note  ____________________________________________   Event Date/Time   First MD Initiated Contact with Patient 10/08/21 1535     (approximate)  I have reviewed the triage vital signs and the nursing notes.   HISTORY  Chief Complaint left arm numbness    HPI Sara Aguirre is a 80 y.o. female with history of stroke with no significant residual side effects who comes in with concern for stroke.  Patient reports that she felt fine this morning was walking outside came back inside felt tired sat down and when she went to go stand back up she noticed that her left arm felt very heavy and she could not use it as well, constant, started around 45 minutes prior to arrival145PM 10/08/2021.  By time that she was seen in triage her symptoms had completely resolved.  She denies having this happen previously.  She is currently on aspirin and Plavix.  Denies any chest pain, shortness of breath or any other concerns.          Past Medical History:  Diagnosis Date   Cancer [facility])    bladder, s/p cystectomy with ileal conduit   History of bladder cancer    History of tobacco use    Hyperlipidemia    Hypertension    Incarcerated ventral hernia, s/p lap repair 12/05/2011   Parastomal hernia of ileal conduit, s/p lap repair [id number] 12/04/2011   Ulcer, gastric, acute or chronic     Patient Active Problem List   Diagnosis Date Noted   History of CVA (cerebrovascular accident) 04/21/2020   Hypokalemia 04/21/2020   Pure hypercholesterolemia 01/17/2017   Thyroid nodule 08/20/2013   HTN (hypertension) 08/20/2013   Body mass index (BMI) of 24.0-24.9 in adult 08/20/2013   Gastric ulcer 12/29/2011   Renal insufficiency 12/29/2011   History of bladder cancer, s/p cystectomy with ileal conduit     Past Surgical History:  Procedure Laterality Date   BLADDER REMOVAL  10/1993   Dr. Risa Grill   HERNIA REPAIR  12/05/11   parastomal    LOOP RECORDER INSERTION N/A 04/23/2020   Procedure: LOOP RECORDER INSERTION;  Surgeon: Deboraha Sprang, MD;  Location: [facility];  Service: Cardiovascular;  Laterality: N/A;   PARASTOMAL HERNIA REPAIR  12/05/2011   Procedure: HERNIA REPAIR PARASTOMAL;  Surgeon: Adin Hector, MD;  Location: [facility];  Service: General;;  Laprascopic lysis of adhestons with reduction and repair with biological mesh for incarcerated parastomal and ventral long incisional hernia.   REVISION UROSTOMY CUTANEOUS     VENTRAL HERNIA REPAIR  12/05/2011   Procedure: HERNIA REPAIR VENTRAL ADULT;  Surgeon: Adin Hector, MD;  Location: [facility];  Service: General;;    Prior to Admission medications   Medication Sig Start Date End Date Taking? Authorizing Provider  amLODipine (NORVASC) 5 MG tablet Take 1 tablet (5 mg total) by mouth daily. For blood pressure. 08/25/21   Pleas Koch, NP  Blood Pressure Monitor DEVI Use to check blood pressure 3 times a week 10/27/19   Forrest Moron, MD  clopidogrel (PLAVIX) 75 MG tablet Take 1 tablet (75 mg total) by mouth daily. For stroke 08/12/21   Pleas Koch, NP  cyanocobalamin 1000 MCG tablet Take 1,000 mcg by mouth daily.    [provider]  lisinopril-hydrochlorothiazide (ZESTORETIC) 20-25 MG tablet Take 1 tablet by mouth daily. For blood pressure. 08/12/21   Pleas Koch, NP  rosuvastatin (CRESTOR) 5 MG tablet  Take 1 tablet (5 mg total) by mouth daily. For cholesterol. 08/13/21 08/13/22  Pleas Koch, NP  UNABLE TO FIND HOLISTER UROSTOMY POUCHES ITEM [phone] 10/31/18   Forrest Moron, MD    Allergies Imodium [loperamide] and Loperamide hcl  Family History  Problem Relation Age of Onset   Heart disease Mother 76       CHF   Alcohol abuse Father    Stroke Father     Social History Social History   Tobacco Use   Smoking status: Former    Types: Cigarettes    Quit date: 01/02/1993    Years since quitting: 28.7   Smokeless tobacco:  Never  Vaping Use   Vaping Use: Never used  Substance Use Topics   Alcohol use: No   Drug use: No      Review of Systems Constitutional: No fever/chills Eyes: No visual changes. ENT: No sore throat. Cardiovascular: Denies chest pain. Respiratory: Denies shortness of breath. Gastrointestinal: No abdominal pain.  No nausea, no vomiting.  No diarrhea.  No constipation. Genitourinary: Negative for dysuria. Musculoskeletal: Negative for back pain. Skin: Negative for rash. Neurological: Left arm weakness All other ROS negative ____________________________________________   PHYSICAL EXAM:  VITAL SIGNS: ED Triage Vitals  Enc Vitals Group     BP 10/08/21 1441 (!) 166/85     Pulse Rate 10/08/21 1441 93     Resp 10/08/21 1441 18     Temp 10/08/21 1441 97.9 F (36.6 C)     Temp Source 10/08/21 1441 Oral     SpO2 10/08/21 1441 95 %     Weight 10/08/21 1442 142 lb (64.4 kg)     Height 10/08/21 1442 5\' 8"  (1.727 m)     Head Circumference --      Peak Flow --      Pain Score 10/08/21 1442 0     Pain Loc --      Pain Edu? --      Excl. in [place]? --     Constitutional: Alert and oriented. Well appearing and in no acute distress. Eyes: Conjunctivae are normal. EOMI. Head: Atraumatic. Nose: No congestion/rhinnorhea. Mouth/Throat: Mucous membranes are moist.   Neck: No stridor. Trachea Midline. FROM Cardiovascular: Normal rate, regular rhythm. Grossly normal heart sounds.  Good peripheral circulation. Respiratory: Normal respiratory effort.  No retractions. Lungs CTAB. Gastrointestinal: Soft and nontender. No distention. No abdominal bruits.  Musculoskeletal: No lower extremity tenderness nor edema.  No joint effusions. Neurologic:  Normal speech and language. No gross focal neurologic deficits are appreciated.  Cranial nerves II to XII are intact.  Equal strength in arms and legs.  No pronator drift. NIHSS zero Skin:  Skin is warm, dry and intact. No rash noted. Psychiatric: Mood  and affect are normal. Speech and behavior are normal. GU: Deferred   ____________________________________________   LABS (all labs ordered are listed, but only abnormal results are displayed)  Labs Reviewed  COMPREHENSIVE METABOLIC PANEL - Abnormal; Notable for the following components:      Result Value   Glucose, Bld 138 (*)    Creatinine, Ser 1.54 (*)    GFR, Estimated 34 (*)    All other components within normal limits  CBG MONITORING, ED - Abnormal; Notable for the following components:   Glucose-Capillary 146 (*)    All other components within normal limits  CBC WITH DIFFERENTIAL/PLATELET  URINALYSIS, ROUTINE W REFLEX MICROSCOPIC  TROPONIN I (HIGH SENSITIVITY)   ____________________________________________   ED ECG REPORT I,  Vanessa Loachapoka, the attending physician, personally viewed and interpreted this ECG.  There is a lot of artifact on her EKG we will have to get a repeat _ Repeat EKG is sinus rate of 55 bradycardic, no ST elevation, no T wave inversions, normal intervals ___________________________________________  RADIOLOGY Robert Bellow, personally viewed and evaluated these images (plain radiographs) as part of my medical decision making, as well as reviewing the written report by the radiologist.  ED MD interpretation: No intracranial hemorrhage  Official radiology report(s): CT Head Wo Contrast  Result Date: 10/08/2021 CLINICAL DATA:  An 80 year old female presents with symptoms of TIA. EXAM: CT HEAD WITHOUT CONTRAST TECHNIQUE: Contiguous axial images were obtained from the base of the skull through the vertex without intravenous contrast. COMPARISON:  Comparison made with Apr 21, 2020. FINDINGS: Brain: No evidence of acute infarction, hemorrhage, hydrocephalus, extra-axial collection or mass lesion/mass effect. Signs of atrophy and chronic microvascular ischemic change are unchanged. Vascular: No hyperdense vessel or unexpected calcification. Skull: Normal.  Negative for fracture or focal lesion. Sinuses/Orbits: Visualized paranasal sinuses and orbits are unremarkable. Other: None. IMPRESSION: No acute intracranial pathology. Signs of atrophy and chronic microvascular ischemic change are unchanged. Electronically Signed   By: Zetta Bills M.D.   On: 10/08/2021 15:07    ____________________________________________   PROCEDURES  Procedure(s) performed (including Critical Care):  Procedures   ____________________________________________   INITIAL IMPRESSION / ASSESSMENT AND PLAN / ED COURSE  Sara Aguirre was evaluated in Emergency Department on 10/08/2021 for the symptoms described in the history of present illness. She was evaluated in the context of the global COVID-19 pandemic, which necessitated consideration that the patient might be at risk for infection with the SARS-CoV-2 virus that causes COVID-19. Institutional protocols and algorithms that pertain to the evaluation of patients at risk for COVID-19 are in a state of rapid change based on information released by regulatory bodies including the CDC and federal and state organizations. These policies and algorithms were followed during the patient's care in the ED.    Patient comes in with sudden onset of left arm heaviness.  With a history of stroke.  I am very concerned about the potential of a TIA.  Her stroke scale is currently 0 therefore stroke code was not called.  Cardiac markers were ordered to make sure no evidence of ACS although she denied any chest pain but just in case it was a mimicker.  Low suspicion for dissection.  Labs ordered evaluate for any electrolyte abnormalities, AKI, glucose issues  Labs are reassuring.  CT head was negative for any intracranial hemorrhage.  Will discuss with hospital team for admission for TIA       ____________________________________________   FINAL CLINICAL IMPRESSION(S) / ED DIAGNOSES   Final diagnoses:  TIA (transient ischemic  attack)      MEDICATIONS GIVEN DURING THIS VISIT:  Medications - No data to display   ED Discharge Orders     None        Note:  This document was prepared using Dragon voice recognition software and may include unintentional dictation errors.    Vanessa Heath, MD 10/08/21 1611    Vanessa Irvington, MD 10/08/21 [phone]

## 2021-10-08 NOTE — Plan of Care (Signed)
Preliminary recs:  # Transient L arm heaviness, c/f TIA  - Stroke labs HgbA1c, fasting lipid panel - MRI brain  - MRA of the brain without contrast and MRA neck w/wo  - Frequent neuro checks - Echocardiogram  - Carotid dopplers only if MRA neck is motion limited - Continue home DAPT (if patient non-adherent, please give loading dose of plavix 300 and ASA 325 respectively) - May need to increase home statin if patient is adherent and LDL > 70  - Risk factor modification - Telemetry monitoring; loop recorder interrogation - Blood pressure goal   - Permissive hypertension to 220/120 for 24-48 hrs unless MRI brain confirms no acute stroke, then gradually normalize - PT consult, OT consult, Speech consult, not needed if patient is back to baseline - Appreciate management of COVID19 positive result, elevated urinary light chains, and other co-morbidities per primary team - Neurology to follow with full recs and eval tomorrow  Data reviewed:   ABCD2 risk score 5 per chart review  Follows with Dr. Melrose Nakayama for mild cognitive impairment and stroke  On home DAPT per chart review  Head CT personally reviewed, no acute intracranial process but chronic microvascular disease may obscure acute process  Labs notable for Cr 1.5, GFR 34, Glucose 138  COVID positive  Lab Results  Component Value Date   HGBA1C 5.6 08/11/2021   Lab Results  Component Value Date   CHOL 229 (H) 08/11/2021   HDL 65.10 08/11/2021   LDLCALC 128 (H) 08/11/2021   TRIG 178.0 (H) 08/11/2021   CHOLHDL 4 08/11/2021    (ABNORMAL) Protein Electrophoresis, Urine Random (09/27/2021 12:21 PM EDT) Lab Results - (ABNORMAL) Protein Electrophoresis, Urine Random (09/27/2021 12:21 PM EDT) Component Value Ref Range Test Method Analysis Time Performed At Pathologist Signature  Creatinine, Ur 70 20 - 275 mg/dL     [facility]    Urine Protein/Creatinine Ratio 543 (H) 24 - 184 mg/g creat     [facility]    Protein/Creatinine  Ratio, Urine 0.543 (H) 0.024 - 0.184 mg/mg creat     [facility]    Protein Urine Random 38 (H) 5 - 24 mg/dL     [facility]    Albumin % 52 %     [facility]    Alpha 1 Globulin, Ur 4 %     [facility]    Alpha 2 Globulin, Ur 10 %     [facility]    Beta Globulin/Total, Ur 15 %     [facility]    Gamma Globulin, Ur 19 %     [facility]    Abnormal Protein Band 1 NOTE NONE DETECTED mg/dL     [facility]    Abnormal Protein Band 2 CANCELED NONE DETECTED mg/dL     [facility]    Comment: Result canceled by the ancillary.  Abnormal Protein Band 3 CANCELED NONE DETECTED mg/dL     [facility]    Comment: Result canceled by the ancillary.  Interpretation:         [facility]    Comment:            Possible abnormal protein band detected in the beta globulins.     This band may represent beta-2 microglobulin, monoclonal     immunoglobulin, or free light chain (Bence-Jones protein).     Immunofixation analysis is available if clinically indicated.   (ABNORMAL) Kappa/Lambda free LT chains w/ratio, Serum (09/27/2021 12:21 PM EDT) Lab Results - (ABNORMAL)  Kappa/Lambda free LT chains w/ratio, Serum (09/27/2021 12:21 PM EDT) Component Value Ref Range Test Method Analysis Time Performed At Pathologist Signature  Free Kappa Lt Chains, S 74.2 (H) 3.3 - 19.4 mg/L     [facility]    Free Lambda Lt Chains, S 42.4 (H) 5.7 - 26.3 mg/L     [facility]    Free Kappa/Lambda Ratio 1.75 (H) 0.26 - 1.65     [facility]    Comment:      Free kappa/lambda ratio in serum of normal individuals     is 0.26-1.65. Excess production of free kappa or lambda     chains can alter this ratio. Monoclonal free light     chains are found in serum of patients with multiple     myeloma, Waldenstrom's macroglobulinemia, mu-heavy chain     disease, primary amyloidosis, light chain deposition     disease, monoclonal gammopathy of undetermined     significance, and lymphoproliferative disorders.      Measurement of free light chain concentration in serum     is useful for diagnosis, prognosis, monitoring disease     activity and following response to therapy of these     disorders.    ECHO 04/22/2020  1. Left ventricular ejection fraction, by estimation, is 55 to 60%. The  left ventricle has normal function. The left ventricle has no regional  wall motion abnormalities. There is mild left ventricular hypertrophy.  Left ventricular diastolic parameters  are consistent with Grade I diastolic dysfunction (impaired relaxation).   2. Right ventricular systolic function is normal. The right ventricular  size is normal. There is normal pulmonary artery systolic pressure. The  estimated right ventricular systolic pressure is 95.2 mmHg.   3. The mitral valve is degenerative. No evidence of mitral valve  regurgitation. No evidence of mitral stenosis.   4. The aortic valve is tricuspid. Aortic valve regurgitation is mild.  Mild aortic valve sclerosis is present, with no evidence of aortic valve  stenosis.   5. The inferior vena cava is normal in size with greater than 50%  respiratory variability, suggesting right atrial pressure of 3 mmHg.   "Stroke admission 04/21/2020 [name] is a 80 y.o. female with history of hypertension, hyperlipidemia, CKD stage III, bladder cancer status post cystectomy with ileal conduit, gastric ulcer who presented on 04/21/2020 to [facility] ED with dizziness and L face and arm numbness.  Eventually transferred to [facility] ED with stroke work-up revealing right PCA (mesial right temporal lobe/hippocampus, medial right temporal lobe, right occipital lobe and callosal splenium) and right thalamic infarcts, embolic secondary to unknown source.  CTA showed fetal right PCA occlusion/near occlusive stenosis and bilateral M2 stenosis with severe stenosis proximal left M2.  Loop recorder placed to evaluate for atrial fibrillation as stroke etiology.  Recommended DAPT  for 3 months then Plavix alone due to PCA occlusion.  Presented with hypertensive urgency with BP 198/114 stabilized during admission with long-term BP goal normotensive range.  LDL 112 and increased atorvastatin dose from 20 mg to 40 mg daily.  Other stroke risk factors include advanced age, former tobacco use and family history of stroke but no personal history of stroke.  Other active problems include hx of bladder cancer s/p cystectomy w/ ileal conduit 1994, CKD stage III and known thyroid nodules.  Residual deficits of left upper quadrantanopia and decreased sensory with therapy recommending home health versus outpatient PT and discharged home in stable condition.  Stroke:   R PCA including R thalamic infarcts, embolic secondary to unknown source CT head No acute abnormality.  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 Atrophy. Sinus dz.    CTA head fetal R PCA w/ occlusion/near occlusive stenosis. B M2 stenosis w/ severe stenoses proximal L M2. B ICA w/ mild atherosclerosis.  CTA neck R ICA  30-40% stenosis. L ICA w/ mild plaque (origin not seen). Proximal L V1 high-grade stenosis vs artifact. MRI  R PCA territory infarcts. Small R thalamic infarcts. Small vessel disease. Atrophy.  LE Doppler no DVT 2D Echo EF 55-60%, no RLS implantable loop recorder placed 5/7 to evaluate for atrial fibrillation as etiology of stroke   LDL 112 HgbA1c 5.9 Lovenox 40 mg sq daily for VTE prophylaxis aspirin 81 mg daily prior to admission, now on aspirin 325 mg daily and clopidogrel 75 mg daily. Continue DAPT x 3 months then plavix alone given PCA occlusion  Therapy recommendations:  HH vs OP PT. No OT or SLP. Disposition:  Return home"

## 2021-10-08 NOTE — Progress Notes (Signed)
PHARMACIST - PHYSICIAN COMMUNICATION  CONCERNING:  Enoxaparin (Lovenox) for DVT Prophylaxis   DESCRIPTION: Patient was prescribed enoxaprin 40mg  q24 hours for VTE prophylaxis.   Filed Weights   10/08/21 1442  Weight: 64.4 kg (142 lb)    Body mass index is 21.59 kg/m.  Estimated Creatinine Clearance: 28.4 mL/min (A) (by C-G formula based on SCr of 1.54 mg/dL (H)).   Patient is candidate for enoxaparin 30mg  every 24 hours based on CrCl <98ml/min or Weight <45kg  RECOMMENDATION: Pharmacy has adjusted enoxaparin dose per [facility] policy.  Patient is now receiving enoxaparin 30 mg every 24 hours    Darnelle Bos, PharmD Clinical Pharmacist  10/08/2021 5:08 PM

## 2021-10-09 ENCOUNTER — Observation Stay: Payer: PPO

## 2021-10-09 ENCOUNTER — Observation Stay (HOSPITAL_BASED_OUTPATIENT_CLINIC_OR_DEPARTMENT_OTHER)
Admit: 2021-10-09 | Discharge: 2021-10-09 | Disposition: A | Discharge status: Home / Self Care | Payer: PPO | Attending: Internal Medicine | Admitting: Internal Medicine

## 2021-10-09 DIAGNOSIS — G459 Transient cerebral ischemic attack, unspecified: Secondary | ICD-10-CM | POA: Diagnosis not present

## 2021-10-09 DIAGNOSIS — Z8673 Personal history of transient ischemic attack (TIA), and cerebral infarction without residual deficits: Secondary | ICD-10-CM | POA: Diagnosis not present

## 2021-10-09 DIAGNOSIS — I6523 Occlusion and stenosis of bilateral carotid arteries: Secondary | ICD-10-CM | POA: Diagnosis not present

## 2021-10-09 LAB — LIPID PANEL
Cholesterol: 122 mg/dL (ref 0–200)
HDL: 61 mg/dL (ref >40)
LDL Cholesterol: 46 mg/dL (ref 0–99)
Total CHOL/HDL Ratio: 2 RATIO
Triglycerides: 73 mg/dL (ref <150)
VLDL: 15 mg/dL (ref 0–40)

## 2021-10-09 IMAGING — US US CAROTID DUPLEX BILAT
1 series · 13 of 24 positions shown · non-contrast
Comparison: None.

CLINICAL DATA: 80-year-old female with history of TIA.

EXAM:
BILATERAL CAROTID DUPLEX ULTRASOUND
TECHNIQUE: Gray scale imaging, color Doppler and duplex ultrasound were
performed of bilateral carotid and vertebral arteries in the neck.

[Series 1: us carotid bilateral · 13 of 67 slices shown]
[im 1/67]
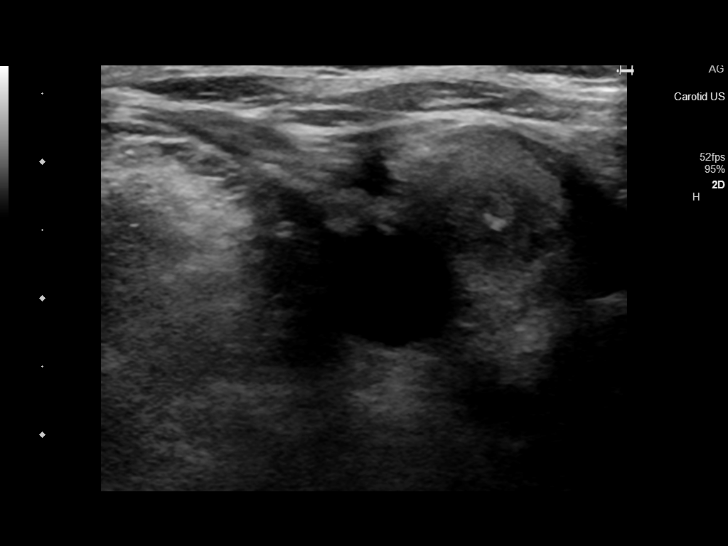
[im 6/67]
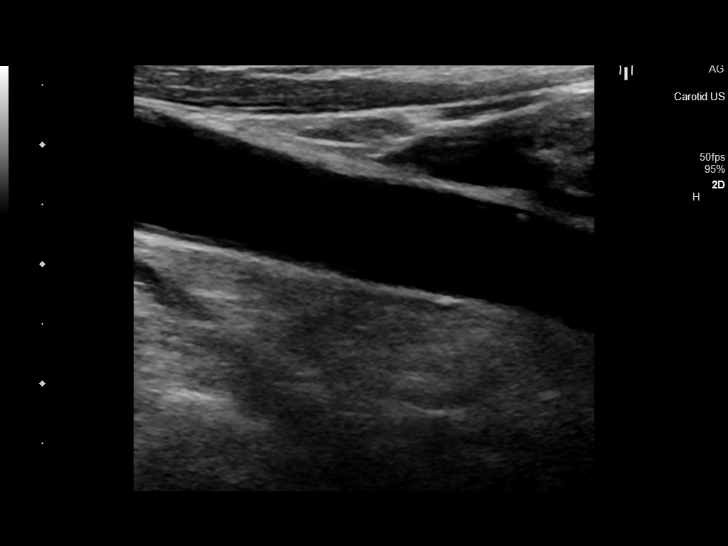
[im 12/67]
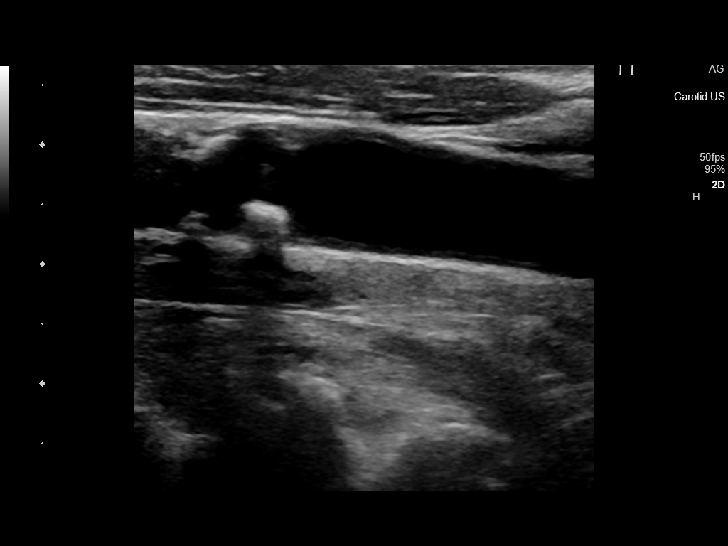
[im 18/67]
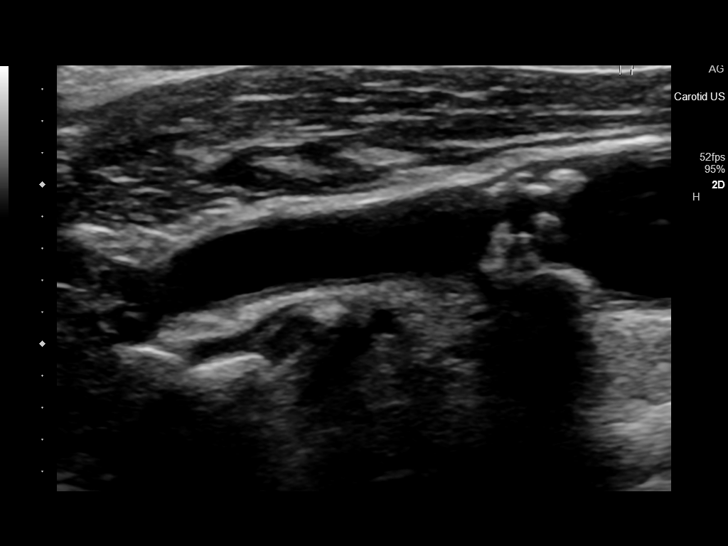
[im 23/67]
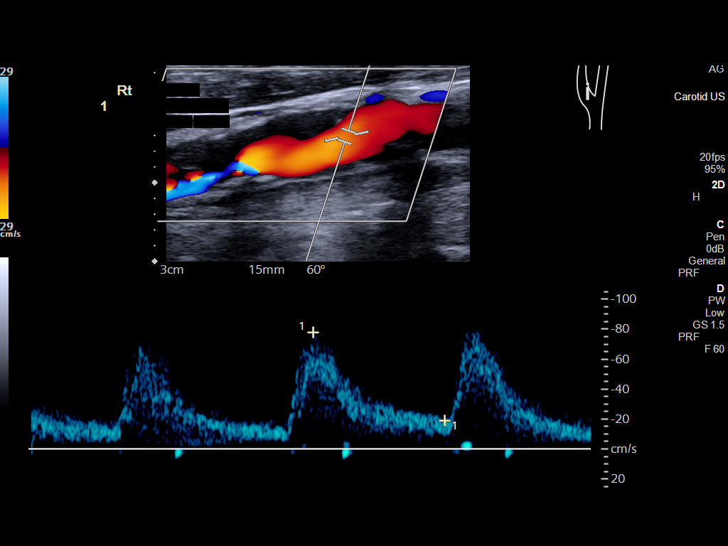
[im 29/67]
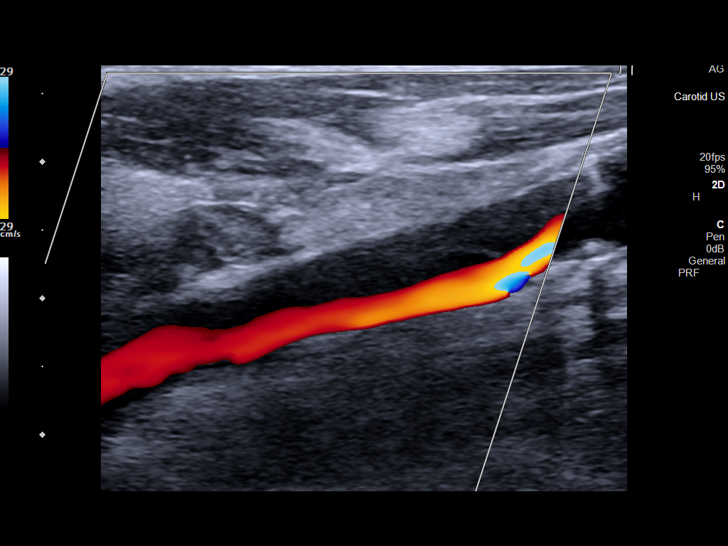
[im 35/67]
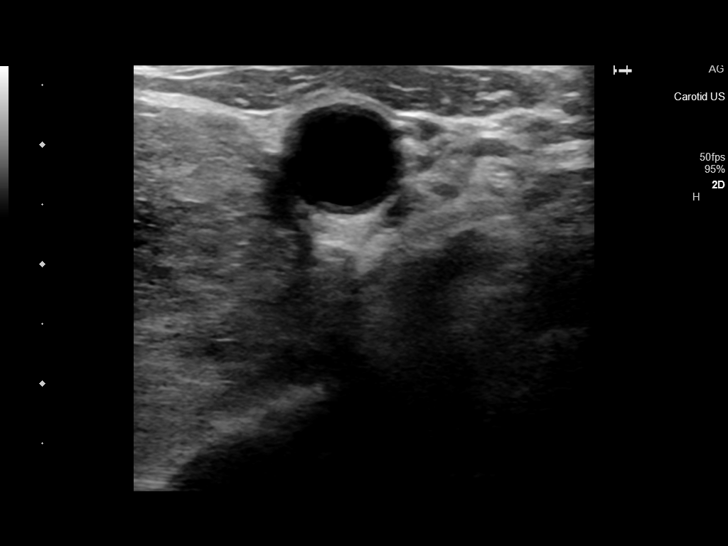
[im 38/67]
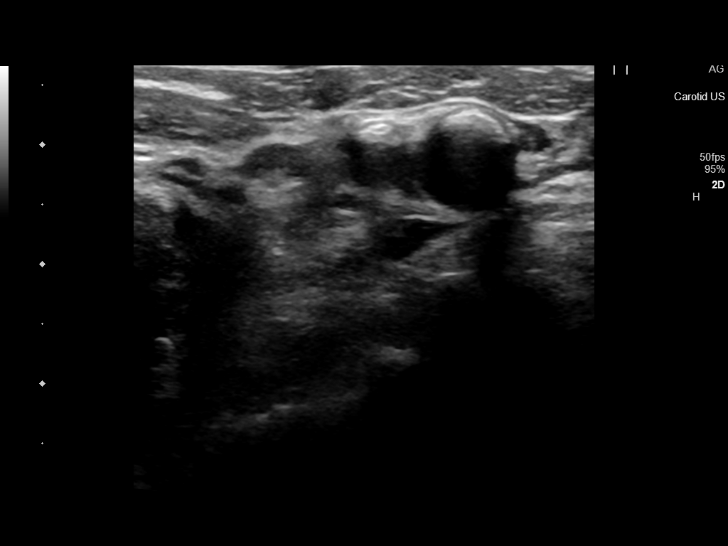
[im 44/67]
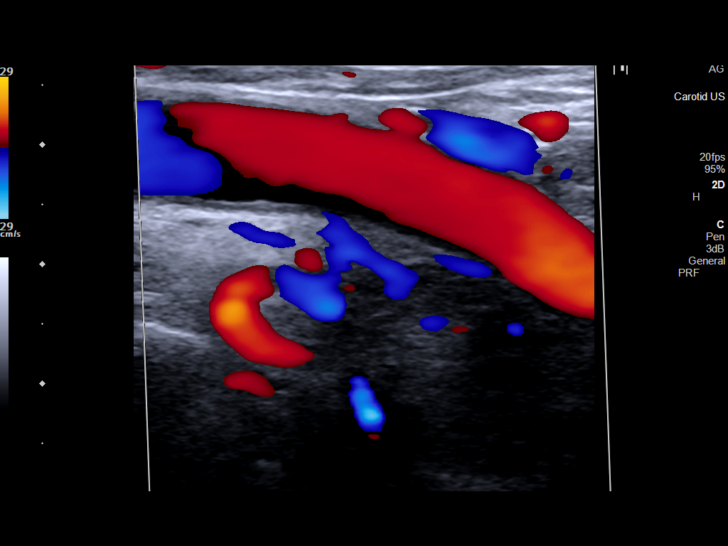
[im 49/67]
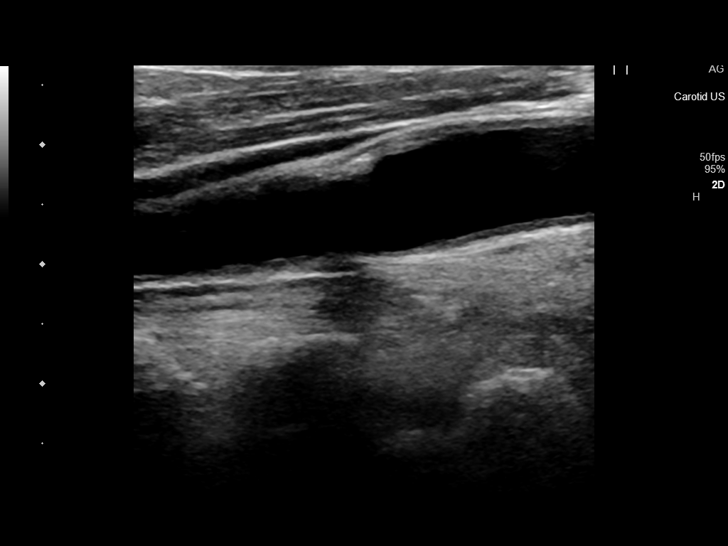
[im 55/67]
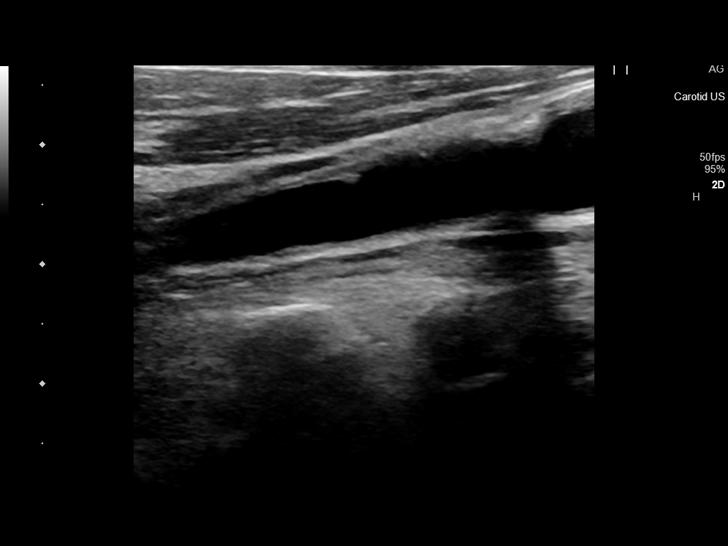
[im 61/67]
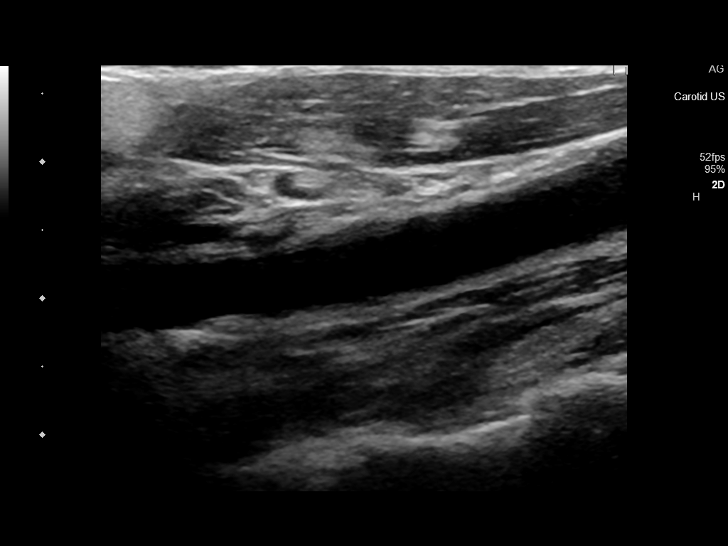
[im 67/67]
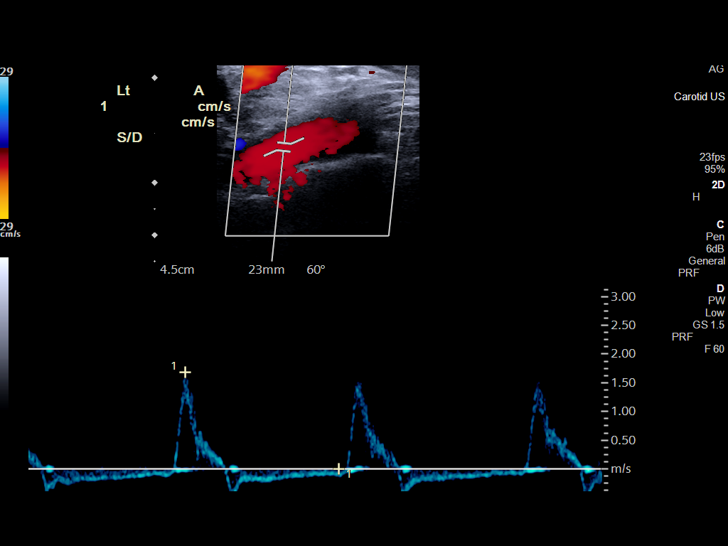

[13 of 24 positions shown; findings below may reference images not displayed]

FINDINGS: Criteria: Quantification of carotid stenosis is based on velocity
parameters that correlate the residual internal carotid diameter
with NASCET-based stenosis levels, using the diameter of the distal
internal carotid lumen as the denominator for stenosis measurement.

The following velocity measurements were obtained:

RIGHT

ICA: Peak systolic velocity 281 cm/sec, End diastolic velocity 80
cm/sec

CCA: Peak systolic velocity 33 cm/sec

SYSTOLIC ICA/CCA RATIO:

ECA: Peak systolic velocity 419 cm/sec

LEFT

ICA: Peak systolic velocity 77 cm/sec, End diastolic velocity 21
cm/sec

CCA: 42 cm/sec

SYSTOLIC ICA/CCA RATIO:

ECA: 93 cm/sec

RIGHT CAROTID ARTERY: Moderate multifocal atherosclerotic plaque
formation, most prominent about the proximal ICA. No significant
tortuosity. Normal low resistance waveforms.

RIGHT VERTEBRAL ARTERY:  Antegrade flow.

LEFT CAROTID ARTERY: Mild multifocal atherosclerotic plaque
formation. No significant tortuosity. Normal low resistance
waveforms.

LEFT VERTEBRAL ARTERY:  Antegrade flow.

Upper extremity non-invasive blood pressures:

Not obtained.
IMPRESSION: 1. Right carotid artery system: Greater than 69% but less than near
occlusion secondary to moderate multifocal atherosclerotic plaque
formation, most prominent about the proximal ICA.

2. Left carotid artery system: Less than 50% stenosis secondary to
mild multifocal atherosclerotic plaque formation.

3.  Vertebral artery system: Patent with antegrade flow bilaterally.

## 2021-10-09 NOTE — Progress Notes (Signed)
MD order received in [facility] to discharge pt home today; verbally reviewed AVS with pt and her son; no new Rxs; pt to contact her primary care physician tomorrow per specific discharge instructions; Vascular Surgeon's office will be contacting the pt in 1-2 weeks to set up appointment for recommended procedure; no questions voiced at this time; volunteer contacted via telephone call for a wheelchair for pt discharge

## 2021-10-09 NOTE — Evaluation (Signed)
Occupational Therapy Evaluation Patient Details Name: KHAMARI YOUSUF MRN: [id number] DOB: 01/26/1941 Today's Date: 10/09/2021   History of Present Illness 80 y.o. female with medical history significant of stroke with mild balance issue, hypertension, hyperlipidemia, gastric ulcer, bladder cancer (s/p of cystectomy with ileal conduit), incarcerated ventral hernia repair, former smoker, s/p of  repair of parastomal hernia of ileal conduit, who present with left arm heaviness. MRI brain reveals "restricted diffusion in the right parietal and occipital lobe cortex, with additional punctate areas of restricted diffusion throughout the right cerebral hemisphere, likely acute infarcts.  Given multiple vascular territories, an embolic etiology is suspected."   Clinical Impression   Pt seen for OT evaluation this date. Prior to hospital admission, pt was independent in all aspects of ADLs, using a quad cane for functional mobility on uneven/unfamiliar surfaces. Pt denies falls history in past 6 months. Pt lives in the in-law suite of her son and daughter-in-law's home with level entry. Pt demonstrates baseline independence to perform ADLs and functional mobility mobility, and no strength, sensory, or visual deficits appreciated with assessment. No additional skilled OT needs identified. Will sign off. Please re-consult if additional OT needs arise.      Recommendations for follow up therapy are one component of a multi-disciplinary discharge planning process, led by the attending physician.  Recommendations may be updated based on patient status, additional functional criteria and insurance authorization.   Follow Up Recommendations  No OT follow up    Equipment Recommendations  None recommended by OT       Precautions / Restrictions Precautions Precautions: None Restrictions Weight Bearing Restrictions: No      Mobility Bed Mobility Overal bed mobility: Independent             General  bed mobility comments: Able to perform with ease, with HOB low and without use of bedrails    Transfers Overall transfer level: Needs assistance Equipment used: None Transfers: Sit to/from Stand Sit to Stand: Supervision         General transfer comment: Supervision d/t some unsteadiness upon standing, but no physical assist required    Balance Overall balance assessment: Needs assistance Sitting-balance support: No upper extremity supported;Feet supported Sitting balance-Leahy Scale: Normal     Standing balance support: No upper extremity supported;During functional activity Standing balance-Leahy Scale: Good Standing balance comment: SUPERVISION for functional mobility without AD                           ADL either performed or assessed with clinical judgement   ADL Overall ADL's : Needs assistance/impaired     Grooming: Wash/dry hands;Supervision/safety;Standing               Lower Body Dressing: Modified independent;Sitting/lateral leans Lower Body Dressing Details (indicate cue type and reason): to don/doff socks             Functional mobility during ADLs: Supervision/safety       Vision Baseline Vision/History: 1 Wears glasses Patient Visual Report: No change from baseline              Pertinent Vitals/Pain Pain Assessment: 0-10 Pain Score: 3  Pain Location: head Pain Descriptors / Indicators: Aching Pain Intervention(s): Limited activity within patient's tolerance;Monitored during session     Hand Dominance Right   Extremity/Trunk Assessment Upper Extremity Assessment Upper Extremity Assessment: Overall WFL for tasks assessed   Lower Extremity Assessment Lower Extremity Assessment: Overall WFL for tasks assessed  Communication Communication Communication: No difficulties   Cognition Arousal/Alertness: Awake/alert Behavior During Therapy: WFL for tasks assessed/performed Overall Cognitive Status: Within  Functional Limits for tasks assessed                                                Home Living Family/patient expects to be discharged to:: Private residence (Pt lives in in-law suite of son & daughter-in-law's home) Living Arrangements: Alone Available Help at Discharge: Family;Available PRN/intermittently Type of Home: House Home Access: Level entry     Home Layout: One level     Bathroom Shower/Tub: Tub/shower unit         Home Equipment: Environmental consultant - 4 wheels;Cane - quad          Prior Functioning/Environment Level of Independence: Independent with assistive device(s)        Comments: Uses cane for mobility on uneven surfaces. Independent with AD for household mobility. Denies fall hx. Independent with ADLs.        OT Problem List: Impaired balance (sitting and/or standing)      OT Treatment/Interventions:      OT Goals(Current goals can be found in the care plan section) Acute Rehab OT Goals Patient Stated Goal: to return home OT Goal Formulation: With patient Time For Goal Achievement: 10/23/21 Potential to Achieve Goals: Good  OT Frequency:      AM-PAC OT "6 Clicks" Daily Activity     Outcome Measure Help from another person eating meals?: None Help from another person taking care of personal grooming?: A Little Help from another person toileting, which includes using toliet, bedpan, or urinal?: A Little Help from another person bathing (including washing, rinsing, drying)?: A Little Help from another person to put on and taking off regular upper body clothing?: None Help from another person to put on and taking off regular lower body clothing?: A Little 6 Click Score: 20   End of Session Nurse Communication: Mobility status  Activity Tolerance: Patient tolerated treatment well Patient left: in chair;with call bell/phone within reach;Other (comment) (with PT)  OT Visit Diagnosis: Unsteadiness on feet (R26.81)                Time:  9357-0177 OT Time Calculation (min): 18 min Charges:  OT General Charges $OT Visit: 1 Visit OT Evaluation $OT Eval Moderate Complexity: 1 Mod OT Treatments $Self Care/Home Management : 8-22 mins  Fredirick Maudlin, OTR/L [place]

## 2021-10-09 NOTE — Evaluation (Signed)
Physical Therapy Evaluation Patient Details Name: Sara Aguirre MRN: [id number] DOB: 10/02/1941 Today's Date: 10/09/2021  History of Present Illness  80 y.o. female with medical history significant of stroke with mild balance issue, hypertension, hyperlipidemia, gastric ulcer, bladder cancer (s/p of cystectomy with ileal conduit), incarcerated ventral hernia repair, former smoker, s/p of  repair of parastomal hernia of ileal conduit, who present with left arm heaviness. MRI brain reveals "restricted diffusion in the right parietal and occipital lobe cortex, with additional punctate areas of restricted diffusion throughout the right cerebral hemisphere, likely acute infarcts.  Given multiple vascular territories, an embolic etiology is suspected."  Clinical Impression  Patient just finishing OT session upon arrival to room. Alert and oriented, follows commands and agreeable to participation with session.  Reports some mild L UE weakness/heaviness upon admission, but feels symptoms have fully resolved at this time.  Endorses baseline paresthesia in L hand digits 2-3 from previous CVA.  Otherwise, bilat UE/LE strength and ROM grossly symmetrical and WFL. No focal weakness, sensory or coordination deficits noted.  Able to complete sit/stand, basic transfers and gait (200') without assist device, supervision. Demonstrates reciprocal stepipng pattern with slighty choppy cadence; mildly excessive weight shift to R throughout gait cycle.  Mild gait deviations with head turns and changes of direction, but no overt buckling or LOB, no physical assist required for recovery.  BERG balance score 47/56, indicative of higher level balance deficits (patient does endorse working with neurologist on balance exercise prior to admission). Would benefit from skilled PT to address above deficits and promote optimal return to PLOF.; recommend transition to home with outpatient PT services upon discharge.       Recommendations  for follow up therapy are one component of a multi-disciplinary discharge planning process, led by the attending physician.  Recommendations may be updated based on patient status, additional functional criteria and insurance authorization.  Follow Up Recommendations Outpatient PT    Equipment Recommendations       Recommendations for Other Services       Precautions / Restrictions Precautions Precautions: None Restrictions Weight Bearing Restrictions: No      Mobility  Bed Mobility Overal bed mobility: Independent             General bed mobility comments: seated in recliner beginning/end of treatment session    Transfers Overall transfer level: Needs assistance Equipment used: None Transfers: Sit to/from Stand Sit to Stand: Supervision         General transfer comment: does require UE support to assist with lift off and overall safety  Ambulation/Gait Ambulation/Gait assistance: Supervision Gait Distance (Feet): 200 Feet Assistive device: None   Gait velocity: 10' walk time, 7-8 seconds   General Gait Details: reciprocal stepipng pattern with slighty choppy cadence; mildly excessive weight shift to R throughout gait cycle.  Mild gait deviations with head turns and changes of direction, but no overt buckling or LOB, no physical assist required for recovery.  Stairs            Wheelchair Mobility    Modified Rankin (Stroke Patients Only)       Balance Overall balance assessment: Needs assistance Sitting-balance support: Feet supported;No upper extremity supported Sitting balance-Leahy Scale: Good     Standing balance support: No upper extremity supported Standing balance-Leahy Scale: Good Standing balance comment: SUPERVISION for functional mobility without AD                 Standardized Balance Assessment Standardized Balance Assessment : Merrilee Jansky  Balance Test Berg Balance Test Sit to Stand: Able to stand without using hands and  stabilize independently Standing Unsupported: Able to stand safely 2 minutes Sitting with Back Unsupported but Feet Supported on Floor or Stool: Able to sit safely and securely 2 minutes Stand to Sit: Sits safely with minimal use of hands Transfers: Able to transfer safely, minor use of hands Standing Unsupported with Eyes Closed: Able to stand 10 seconds safely Standing Ubsupported with Feet Together: Able to place feet together independently and stand 1 minute safely From Standing, Reach Forward with Outstretched Arm: Can reach confidently >25 cm (10") From Standing Position, Pick up Object from Floor: Able to pick up shoe safely and easily From Standing Position, Turn to Look Behind Over each Shoulder: Looks behind one side only/other side shows less weight shift Turn 360 Degrees: Able to turn 360 degrees safely in 4 seconds or less Standing Unsupported, Alternately Place Feet on Step/Stool: Able to complete >2 steps/needs minimal assist Standing Unsupported, One Foot in Front: Able to take small step independently and hold 30 seconds Standing on One Leg: Tries to lift leg/unable to hold 3 seconds but remains standing independently Total Score: 47         Pertinent Vitals/Pain Pain Assessment: Faces Pain Score: 3  Faces Pain Scale: No hurt Pain Location: head Pain Descriptors / Indicators: Aching Pain Intervention(s): Limited activity within patient's tolerance;Monitored during session    Home Living Family/patient expects to be discharged to:: Private residence (lives in in-law suite of son and daughter-in-laws home) Living Arrangements: Alone Available Help at Discharge: Family;Available PRN/intermittently Type of Home: House Home Access: Level entry     Home Layout: One level Home Equipment: Besecker - 4 wheels;Cane - quad      Prior Function Level of Independence: Independent with assistive device(s)         Comments: Uses SPC for mobility on uneven surfaces.  Independent with AD for household mobility. Denies fall hx. Independent with ADLs.     Hand Dominance   Dominant Hand: Right    Extremity/Trunk Assessment   Upper Extremity Assessment Upper Extremity Assessment: Overall WFL for tasks assessed    Lower Extremity Assessment Lower Extremity Assessment: Overall WFL for tasks assessed (grossly 4+/5 throughout; no sensory, coordination deficits appreciated)       Communication   Communication: No difficulties  Cognition Arousal/Alertness: Awake/alert Behavior During Therapy: WFL for tasks assessed/performed Overall Cognitive Status: Within Functional Limits for tasks assessed                                        General Comments      Exercises     Assessment/Plan    PT Assessment Patient needs continued PT services  PT Problem List Decreased strength;Decreased balance;Decreased mobility;Decreased coordination       PT Treatment Interventions DME instruction;Gait training;Balance training;Functional mobility training;Therapeutic activities;Therapeutic exercise;Patient/family education;Neuromuscular re-education    PT Goals (Current goals can be found in the Care Plan section)  Acute Rehab PT Goals Patient Stated Goal: to return home PT Goal Formulation: With patient Time For Goal Achievement: 10/23/21 Potential to Achieve Goals: Good    Frequency Min 2X/week   Barriers to discharge        Co-evaluation               AM-PAC PT "6 Clicks" Mobility  Outcome Measure Help needed turning from your  back to your side while in a flat bed without using bedrails?: None Help needed moving from lying on your back to sitting on the side of a flat bed without using bedrails?: None Help needed moving to and from a bed to a chair (including a wheelchair)?: None Help needed standing up from a chair using your arms (e.g., wheelchair or bedside chair)?: None Help needed to walk in hospital room?: A  Little Help needed climbing 3-5 steps with a railing? : A Little 6 Click Score: 22    End of Session   Activity Tolerance: Patient tolerated treatment well Patient left: in chair;with call bell/phone within reach Nurse Communication: Mobility status PT Visit Diagnosis: Muscle weakness (generalized) (M62.81);Difficulty in walking, not elsewhere classified (R26.2);Hemiplegia and hemiparesis Hemiplegia - Right/Left: Left Hemiplegia - dominant/non-dominant: Non-dominant Hemiplegia - caused by: Cerebral infarction    Time: 3818-4037 PT Time Calculation (min) (ACUTE ONLY): 12 min   Charges:   PT Evaluation $PT Eval Low Complexity: 1 Low         Daymon Hora H. Owens Shark, PT, DPT, NCS 10/09/21, 10:17 AM [phone]

## 2021-10-09 NOTE — Consult Note (Signed)
Reason for Consult: Symptomatic right carotid artery stenosis Referring Physician: Dr. Nicole Kindred  Sara Aguirre is an 80 y.o. female.  HPI: This is an 80 year old lady that comes in with complaints of left arm weakness that subsided within less than a few minutes.  She was admitted via the emergency room and found to have elevated carotid artery velocities suggesting stenosis greater than 69%.  Patient states that her left arm symptoms have completely subsided and she is back to normal, but is concerned.   Past Medical History:  Diagnosis Date  . Cancer [facility])    bladder, s/p cystectomy with ileal conduit  . History of bladder cancer   . History of tobacco use   . Hyperlipidemia   . Hypertension   . Incarcerated ventral hernia, s/p lap repair 12/05/2011  . Parastomal hernia of ileal conduit, s/p lap repair [id number] 12/04/2011  . Ulcer, gastric, acute or chronic     Past Surgical History:  Procedure Laterality Date  . BLADDER REMOVAL  10/1993   Dr. Risa Grill  . HERNIA REPAIR  12/05/11   parastomal  . LOOP RECORDER INSERTION N/A 04/23/2020   Procedure: LOOP RECORDER INSERTION;  Surgeon: Deboraha Sprang, MD;  Location: [facility];  Service: Cardiovascular;  Laterality: N/A;  . PARASTOMAL HERNIA REPAIR  12/05/2011   Procedure: HERNIA REPAIR PARASTOMAL;  Surgeon: Adin Hector, MD;  Location: [facility];  Service: General;;  Laprascopic lysis of adhestons with reduction and repair with biological mesh for incarcerated parastomal and ventral long incisional hernia.  Marland Kitchen REVISION UROSTOMY CUTANEOUS    . VENTRAL HERNIA REPAIR  12/05/2011   Procedure: HERNIA REPAIR VENTRAL ADULT;  Surgeon: Adin Hector, MD;  Location: [facility];  Service: General;;    Family History  Problem Relation Age of Onset  . Heart disease Mother 49       CHF  . Alcohol abuse Father   . Stroke Father     Social History:  reports that she quit smoking about 28 years ago. She has never used smokeless tobacco.  She reports that she does not drink alcohol and does not use drugs.  Allergies:  Allergies  Allergen Reactions  . Imodium [Loperamide]     Itchy all over  . Loperamide Hcl Rash    Medications: I have reviewed the patient's current medications. Prior to Admission:  Medications Prior to Admission  Medication Sig Dispense Refill Last Dose  . amLODipine (NORVASC) 5 MG tablet Take 1 tablet (5 mg total) by mouth daily. For blood pressure. 90 tablet 2 10/08/2021  . aspirin EC 81 MG tablet Take 81 mg by mouth daily. Swallow whole.   10/08/2021  . clopidogrel (PLAVIX) 75 MG tablet Take 1 tablet (75 mg total) by mouth daily. For stroke 90 tablet 3 10/08/2021  . cyanocobalamin 1000 MCG tablet Take 1,000 mcg by mouth daily.   10/08/2021  . losartan (COZAAR) 100 MG tablet Take 100 mg by mouth daily.   10/08/2021  . rosuvastatin (CRESTOR) 5 MG tablet Take 1 tablet (5 mg total) by mouth daily. For cholesterol. 90 tablet 3 10/08/2021  . Blood Pressure Monitor DEVI Use to check blood pressure 3 times a week 1 Device 0   . lisinopril-hydrochlorothiazide (ZESTORETIC) 20-25 MG tablet Take 1 tablet by mouth daily. For blood pressure. (Patient not taking: Reported on 10/08/2021) 90 tablet 3 Not Taking  . UNABLE TO FIND HOLISTER UROSTOMY POUCHES ITEM 8478 100 Device 3    Scheduled: . aspirin EC  81 mg Oral Daily  . clopidogrel  75 mg Oral Daily  . enoxaparin (LOVENOX) injection  30 mg Subcutaneous Q24H  . rosuvastatin  5 mg Oral Daily  . cyanocobalamin  1,000 mcg Oral Daily   Continuous:  Results for orders placed or performed during the hospital encounter of 10/08/21 (from the past 48 hour(s))  Comprehensive metabolic panel     Status: Abnormal   Collection Time: 10/08/21  2:42 PM  Result Value Ref Range   Sodium 139 135 - 145 mmol/L   Potassium 4.2 3.5 - 5.1 mmol/L   Chloride 101 98 - 111 mmol/L   CO2 29 22 - 32 mmol/L   Glucose, Bld 138 (H) 70 - 99 mg/dL    Comment: Glucose reference range  applies only to samples taken after fasting for at least 8 hours.   BUN 18 8 - 23 mg/dL   Creatinine, Ser 1.54 (H) 0.44 - 1.00 mg/dL   Calcium 9.7 8.9 - 10.3 mg/dL   Total Protein 7.4 6.5 - 8.1 g/dL   Albumin 3.8 3.5 - 5.0 g/dL   AST 20 15 - 41 U/L   ALT 10 0 - 44 U/L   Alkaline Phosphatase 59 38 - 126 U/L   Total Bilirubin 0.7 0.3 - 1.2 mg/dL   GFR, Estimated 34 (L) >60 mL/min    Comment: (NOTE) Calculated using the CKD-EPI Creatinine Equation (2021)    Anion gap 9 5 - 15    Comment: Performed at [facility], [place], [place] [address]  Troponin I (High Sensitivity)     Status: None   Collection Time: 10/08/21  2:42 PM  Result Value Ref Range   Troponin I (High Sensitivity) 6 <18 ng/L    Comment: (NOTE) Elevated high sensitivity troponin I (hsTnI) values and significant  changes across serial measurements may suggest ACS but many other  chronic and acute conditions are known to elevate hsTnI results.  Refer to the "Links" section for chest pain algorithms and additional  guidance. Performed at [facility], [address] [place], [place] [address]   CBC with Differential     Status: None   Collection Time: 10/08/21  2:42 PM  Result Value Ref Range   WBC 8.8 4.0 - 10.5 K/uL   RBC 4.46 3.87 - 5.11 MIL/uL   Hemoglobin 13.7 12.0 - 15.0 g/dL   HCT 41.4 36.0 - 46.0 %   MCV 92.8 80.0 - 100.0 fL   MCH 30.7 26.0 - 34.0 pg   MCHC 33.1 30.0 - 36.0 g/dL   RDW 13.2 11.5 - 15.5 %   Platelets 264 150 - 400 K/uL   nRBC 0.0 0.0 - 0.2 %   Neutrophils Relative % 80 %   Neutro Abs 7.0 1.7 - 7.7 K/uL   Lymphocytes Relative 11 %   Lymphs Abs 1.0 0.7 - 4.0 K/uL   Monocytes Relative 7 %   Monocytes Absolute 0.6 0.1 - 1.0 K/uL   Eosinophils Relative 2 %   Eosinophils Absolute 0.2 0.0 - 0.5 K/uL   Basophils Relative 0 %   Basophils Absolute 0.0 0.0 - 0.1 K/uL   Immature Granulocytes 0 %   Abs Immature Granulocytes 0.02 0.00 - 0.07 K/uL    Comment:  Performed at [facility], [address] [place], [place] [address]  CBG monitoring, ED     Status: Abnormal   Collection Time: 10/08/21  2:45 PM  Result Value Ref Range   Glucose-Capillary  146 (H) 70 - 99 mg/dL    Comment: Glucose reference range applies only to samples taken after fasting for at least 8 hours.  Resp Panel by RT-PCR (Flu A&B, Covid) Nasopharyngeal Swab     Status: Abnormal   Collection Time: 10/08/21  4:30 PM   Specimen: Nasopharyngeal Swab; Nasopharyngeal(NP) swabs in vial transport medium  Result Value Ref Range   SARS Coronavirus 2 by RT PCR POSITIVE (A) NEGATIVE    Comment: RESULT CALLED TO, READ BACK BY AND VERIFIED WITH: C/ROBIN THOMAS AT 1745 10/08/21.PMF (NOTE) SARS-CoV-2 target nucleic acids are DETECTED.  The SARS-CoV-2 RNA is generally detectable in upper respiratory specimens during the acute phase of infection. Positive results are indicative of the presence of the identified virus, but do not rule out bacterial infection or co-infection with other pathogens not detected by the test. Clinical correlation with patient history and other diagnostic information is necessary to determine patient infection status. The expected result is Negative.  Fact Sheet for Patients: EntrepreneurPulse.com.au  Fact Sheet for Healthcare Providers: IncredibleEmployment.be  This test is not yet approved or cleared by the [place] FDA and  has been authorized for detection and/or diagnosis of SARS-CoV-2 by FDA under an Emergency Use Authorization (EUA).  This EUA will remain in effect (meaning this test can  be used) for the duration of  the COVID-19 declaration under Section 564(b)(1) of the Act, 21 U.S.C. section 360bbb-3(b)(1), unless the authorization is terminated or revoked sooner.     Influenza A by PCR NEGATIVE NEGATIVE   Influenza B by PCR NEGATIVE NEGATIVE    Comment: (NOTE) The Xpert Xpress  SARS-CoV-2/FLU/RSV plus assay is intended as an aid in the diagnosis of influenza from Nasopharyngeal swab specimens and should not be used as a sole basis for treatment. Nasal washings and aspirates are unacceptable for Xpert Xpress SARS-CoV-2/FLU/RSV testing.  Fact Sheet for Patients: EntrepreneurPulse.com.au  Fact Sheet for Healthcare Providers: IncredibleEmployment.be  This test is not yet approved or cleared by the [place] FDA and has been authorized for detection and/or diagnosis of SARS-CoV-2 by FDA under an Emergency Use Authorization (EUA). This EUA will remain in effect (meaning this test can be used) for the duration of the COVID-19 declaration under Section 564(b)(1) of the Act, 21 U.S.C. section 360bbb-3(b)(1), unless the authorization is terminated or revoked.  Performed at [facility], [address] [place], [place] [address]   Protime-INR     Status: None   Collection Time: 10/08/21  6:25 PM  Result Value Ref Range   Prothrombin Time 13.8 11.4 - 15.2 seconds   INR 1.1 0.8 - 1.2    Comment: (NOTE) INR goal varies based on device and disease states. Performed at [facility], [address] [place], [place] [address]   Lipid panel     Status: None   Collection Time: 10/09/21  3:30 AM  Result Value Ref Range   Cholesterol 122 0 - 200 mg/dL   Triglycerides 73 <150 mg/dL   HDL 61 >40 mg/dL   Total CHOL/HDL Ratio 2.0 RATIO   VLDL 15 0 - 40 mg/dL   LDL Cholesterol 46 0 - 99 mg/dL    Comment:        Total Cholesterol/HDL:CHD Risk Coronary Heart Disease Risk Table                     Men   Women  1/2 Average Risk   3.4   3.3  Average Risk  5.0   4.4  2 X Average Risk   9.6   7.1  3 X Average Risk  23.4   11.0        Use the calculated Patient Ratio above and the CHD Risk Table to determine the patient's CHD Risk.        ATP III CLASSIFICATION (LDL):  <100     mg/dL   Optimal  100-129   mg/dL   Near or Above                    Optimal  130-159  mg/dL   Borderline  160-189  mg/dL   High  >190     mg/dL   Very High Performed at [place], [place] [address]     CT Head Wo Contrast  Result Date: 10/08/2021 CLINICAL DATA:  An 80 year old female presents with symptoms of TIA. EXAM: CT HEAD WITHOUT CONTRAST TECHNIQUE: Contiguous axial images were obtained from the base of the skull through the vertex without intravenous contrast. COMPARISON:  Comparison made with Apr 21, 2020. FINDINGS: Brain: No evidence of acute infarction, hemorrhage, hydrocephalus, extra-axial collection or mass lesion/mass effect. Signs of atrophy and chronic microvascular ischemic change are unchanged. Vascular: No hyperdense vessel or unexpected calcification. Skull: Normal. Negative for fracture or focal lesion. Sinuses/Orbits: Visualized paranasal sinuses and orbits are unremarkable. Other: None. IMPRESSION: No acute intracranial pathology. Signs of atrophy and chronic microvascular ischemic change are unchanged. Electronically Signed   By: Zetta Bills M.D.   On: 10/08/2021 15:07   MR ANGIO HEAD WO CONTRAST  Result Date: 10/08/2021 CLINICAL DATA:  TIA EXAM: MRI HEAD WITHOUT CONTRAST MRA HEAD WITHOUT CONTRAST TECHNIQUE: Multiplanar, multi-echo pulse sequences of the brain and surrounding structures were acquired without intravenous contrast. Angiographic images of the Circle of Willis were acquired using MRA technique without intravenous contrast. COMPARISON:  No pertinent prior exam. FINDINGS: MRI HEAD FINDINGS Brain: Restricted diffusion in the right parietal and occipital lobe cortex (series 5, images 20-28, with additional punctate areas of restricted diffusion right in the caudate head (series 5, image 26), right temporal lobe (image 18), and para falcine posterior right frontal lobe (image 36). No acute hemorrhage, mass, mass effect, or midline shift. No hydrocephalus  or extra-axial collection. Confluent T2 hyperintense signal in the periventricular white matter, likely the sequela of severe chronic small vessel ischemic disease. Vascular: See MRA findings below. Skull and upper cervical spine: Normal marrow signal. Sinuses/Orbits: Mucous retention cyst in the left maxillary sinus. Status post bilateral lens replacements. Other: The mastoids are well aerated. MRA HEAD FINDINGS Anterior circulation: Both internal carotid arteries are patent to the termini, without stenosis or other abnormality. A1 segments patent. Normal anterior communicating artery. Anterior cerebral arteries are patent to their distal aspects. No M1 stenosis or occlusion. Normal MCA bifurcations. Distal MCA branches perfused and symmetric. Posterior circulation: The left vertebral artery is hypoplastic or significantly stenosed. The right vertebral artery is widely patent. Posterior inferior cerebral arteries patent bilaterally. Basilar patent to its distal aspect. Superior cerebral arteries patent bilaterally. The left PCA is well perfused to its distal aspects without stenosis. Focal stenosis in the right P1. The right posterior communicating artery is visualized but demonstrates stenosis distally (series 5, image 99). The junction and proximal right P2 segment are not opacified (series 5, image 98), with poor opacification of the remainder of the right PCA, likely severe stenosis. Anatomic variants: None significant IMPRESSION: 1. Restricted diffusion in  the right parietal and occipital lobe cortex, with additional punctate areas of restricted diffusion throughout the right cerebral hemisphere, likely acute infarcts. Given multiple vascular territories, an embolic etiology is suspected. 2. Poor opacification of the proximal right P1 and distal right posterior communicating artery, with non visualization of the junction and proximal right P2, with only minimal opacification distally, likely severe stenosis.  3. 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 These results were called by telephone at the time of interpretation on 10/08/2021 at 10:13 pm to provider Dr. Sidney Ace, who verbally acknowledged these results. Electronically Signed   By: Merilyn Baba M.D.   On: 10/08/2021 22:14   MR BRAIN WO CONTRAST  Result Date: 10/08/2021 CLINICAL DATA:  TIA EXAM: MRI HEAD WITHOUT CONTRAST MRA HEAD WITHOUT CONTRAST TECHNIQUE: Multiplanar, multi-echo pulse sequences of the brain and surrounding structures were acquired without intravenous contrast. Angiographic images of the Circle of Willis were acquired using MRA technique without intravenous contrast. COMPARISON:  No pertinent prior exam. FINDINGS: MRI HEAD FINDINGS Brain: Restricted diffusion in the right parietal and occipital lobe cortex (series 5, images 20-28, with additional punctate areas of restricted diffusion right in the caudate head (series 5, image 26), right temporal lobe (image 18), and para falcine posterior right frontal lobe (image 36). No acute hemorrhage, mass, mass effect, or midline shift. No hydrocephalus or extra-axial collection. Confluent T2 hyperintense signal in the periventricular white matter, likely the sequela of severe chronic small vessel ischemic disease. Vascular: See MRA findings below. Skull and upper cervical spine: Normal marrow signal. Sinuses/Orbits: Mucous retention cyst in the left maxillary sinus. Status post bilateral lens replacements. Other: The mastoids are well aerated. MRA HEAD FINDINGS Anterior circulation: Both internal carotid arteries are patent to the termini, without stenosis or other abnormality. A1 segments patent. Normal anterior communicating artery. Anterior cerebral arteries are patent to their distal aspects. No M1 stenosis or occlusion. Normal MCA bifurcations. Distal MCA branches perfused and symmetric. Posterior circulation: The left vertebral artery is hypoplastic or  significantly stenosed. The right vertebral artery is widely patent. Posterior inferior cerebral arteries patent bilaterally. Basilar patent to its distal aspect. Superior cerebral arteries patent bilaterally. The left PCA is well perfused to its distal aspects without stenosis. Focal stenosis in the right P1. The right posterior communicating artery is visualized but demonstrates stenosis distally (series 5, image 99). The junction and proximal right P2 segment are not opacified (series 5, image 98), with poor opacification of the remainder of the right PCA, likely severe stenosis. Anatomic variants: None significant IMPRESSION: 1. Restricted diffusion in the right parietal and occipital lobe cortex, with additional punctate areas of restricted diffusion throughout the right cerebral hemisphere, likely acute infarcts. Given multiple vascular territories, an embolic etiology is suspected. 2. Poor opacification of the proximal right P1 and distal right posterior communicating artery, with non visualization of the junction and proximal right P2, with only minimal opacification distally, likely severe stenosis. 3. 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 These results were called by telephone at the time of interpretation on 10/08/2021 at 10:13 pm to provider Dr. Sidney Ace, who verbally acknowledged these results. Electronically Signed   By: Merilyn Baba M.D.   On: 10/08/2021 22:14   US Carotid Bilateral (at [facility] and [facility] only)  Result Date: 10/09/2021 CLINICAL DATA:  80 year old female with history of TIA. EXAM: BILATERAL CAROTID DUPLEX ULTRASOUND TECHNIQUE: Pearline Cables scale imaging, color Doppler and duplex ultrasound were performed of bilateral carotid and vertebral arteries in the neck.  COMPARISON:  None. FINDINGS: Criteria: Quantification of carotid stenosis is based on velocity parameters that correlate the residual internal carotid diameter with NASCET-based stenosis levels, using  the diameter of the distal internal carotid lumen as the denominator for stenosis measurement. The following velocity measurements were obtained: RIGHT ICA: Peak systolic velocity 366 cm/sec, End diastolic velocity 80 cm/sec CCA: Peak systolic velocity 33 cm/sec SYSTOLIC ICA/CCA RATIO:  8.4 ECA: Peak systolic velocity 294 cm/sec LEFT ICA: Peak systolic velocity 77 cm/sec, End diastolic velocity 21 cm/sec CCA: 42 cm/sec SYSTOLIC ICA/CCA RATIO:  1.8 ECA: 93 cm/sec RIGHT CAROTID ARTERY: Moderate multifocal atherosclerotic plaque formation, most prominent about the proximal ICA. No significant tortuosity. Normal low resistance waveforms. RIGHT VERTEBRAL ARTERY:  Antegrade flow. LEFT CAROTID ARTERY: Mild multifocal atherosclerotic plaque formation. No significant tortuosity. Normal low resistance waveforms. LEFT VERTEBRAL ARTERY:  Antegrade flow. Upper extremity non-invasive blood pressures: Not obtained. IMPRESSION: 1. Right carotid artery system: Greater than 69% but less than near occlusion secondary to moderate multifocal atherosclerotic plaque formation, most prominent about the proximal ICA. 2. Left carotid artery system: Less than 50% stenosis secondary to mild multifocal atherosclerotic plaque formation. 3.  Vertebral artery system: Patent with antegrade flow bilaterally. Ruthann Cancer, MD [facility] Electronically Signed   By: Ruthann Cancer M.D.   On: 10/09/2021 09:23    Review of Systems  All other systems reviewed and are negative. Blood pressure (!) 153/59, pulse 61, temperature 98.6 F (37 C), resp. rate 16, height 5\' 8"  (1.727 m), weight 64.4 kg, SpO2 96 %. Physical Exam Vitals and nursing note reviewed.  Constitutional:      General: She is not in acute distress.    Appearance: Normal appearance. She is normal weight. She is not ill-appearing, toxic-appearing or diaphoretic.  HENT:     Head: Normocephalic and atraumatic.     Nose: Nose  normal.     Mouth/Throat:     Mouth: Mucous membranes are moist.     Pharynx: Oropharynx is clear.  Eyes:     Extraocular Movements: Extraocular movements intact.     Conjunctiva/sclera: Conjunctivae normal.     Pupils: Pupils are equal, round, and reactive to light.  Neck:     Thyroid: Thyromegaly present.     Vascular: Carotid bruit present.   Cardiovascular:     Rate and Rhythm: Normal rate and regular rhythm.     Pulses: Normal pulses.     Heart sounds: Normal heart sounds.  Abdominal:     General: Abdomen is flat. Bowel sounds are normal. There is no distension.     Palpations: Abdomen is soft. There is no mass.     Tenderness: There is no abdominal tenderness. There is no left CVA tenderness, guarding or rebound.     Hernia: No hernia is present.  Musculoskeletal:        General: No swelling, tenderness, deformity or signs of injury. Normal range of motion.     Right upper arm: Normal.     Left upper arm: Normal.     Cervical back: Normal range of motion and neck supple. No rigidity or tenderness.     Comments: Weakly palpable bilateral lower extremity dorsalis pedis and posterior tibial vessels.  Skin:    General: Skin is warm and dry.  Neurological:     General: No focal deficit present.     Mental Status: She is alert and oriented to person, place, and time. Mental status is at baseline.  Psychiatric:        Mood and Affect: Mood normal.        Behavior: Behavior normal.        Thought Content: Thought content normal.        Judgment: Judgment normal.    Assessment/Plan: This is an 80 year old female patient with right carotid artery stenosis and left upper extremity affecting transit ischemic attack. Recommend antiplatelet aggregates (DAPT) I will recommend carotid artery stenting versus carotid artery endarterectomy.  Discussed my findings with her son.  Surgery should be performed within 1 to 2 weeks of initial neurological insult.  Elmore Guise 10/09/2021,  2:34 PM

## 2021-10-09 NOTE — Plan of Care (Signed)
  Problem: Education: Goal: Knowledge of disease or condition will improve Outcome: Progressing Goal: Knowledge of secondary prevention will improve (SELECT ALL) Outcome: Progressing Goal: Knowledge of patient specific risk factors will improve (INDIVIDUALIZE FOR PATIENT) Outcome: Progressing   Problem: Coping: Goal: Will verbalize positive feelings about self Outcome: Progressing Goal: Will identify appropriate support needs Outcome: Progressing   Problem: Health Behavior/Discharge Planning: Goal: Ability to manage health-related needs will improve Outcome: Progressing   Problem: Self-Care: Goal: Ability to participate in self-care as condition permits will improve Outcome: Progressing Goal: Verbalization of feelings and concerns over difficulty with self-care will improve Outcome: Progressing Goal: Ability to communicate needs accurately will improve Outcome: Progressing   Problem: Ischemic Stroke/TIA Tissue Perfusion: Goal: Complications of ischemic stroke/TIA will be minimized Outcome: Progressing   Problem: Education: Goal: Knowledge of General Education information will improve Description: Including pain rating scale, medication(s)/side effects and non-pharmacologic comfort measures Outcome: Progressing   Problem: Health Behavior/Discharge Planning: Goal: Ability to manage health-related needs will improve Outcome: Progressing   Problem: Clinical Measurements: Goal: Ability to maintain clinical measurements within normal limits will improve Outcome: Progressing Goal: Will remain free from infection Outcome: Progressing Goal: Diagnostic test results will improve Outcome: Progressing Goal: Respiratory complications will improve Outcome: Progressing Goal: Cardiovascular complication will be avoided Outcome: Progressing   Problem: Activity: Goal: Risk for activity intolerance will decrease Outcome: Progressing  Ambulated in hallway with Physical Therapist this  shift Problem: Nutrition: Goal: Adequate nutrition will be maintained Outcome: Progressing   Problem: Coping: Goal: Level of anxiety will decrease Outcome: Progressing   Problem: Elimination: Goal: Will not experience complications related to bowel motility Outcome: Progressing Goal: Will not experience complications related to urinary retention Outcome: Progressing   Problem: Pain Managment: Goal: General experience of comfort will improve Outcome: Progressing   Problem: Safety: Goal: Ability to remain free from injury will improve Outcome: Progressing   Problem: Skin Integrity: Goal: Risk for impaired skin integrity will decrease Outcome: Progressing

## 2021-10-09 NOTE — Progress Notes (Signed)
Pt discharged via wheelchair by volunteer to the [place] entrance

## 2021-10-09 NOTE — Plan of Care (Signed)
Problem: Education: Goal: Knowledge of disease or condition will improve 10/09/2021 1813 by Oris Drone, RN Outcome: Adequate for Discharge 10/09/2021 1141 by Oris Drone, RN Outcome: Progressing Goal: Knowledge of secondary prevention will improve (SELECT ALL) 10/09/2021 1813 by Oris Drone, RN Outcome: Adequate for Discharge 10/09/2021 1141 by Oris Drone, RN Outcome: Progressing Goal: Knowledge of patient specific risk factors will improve (INDIVIDUALIZE FOR PATIENT) 10/09/2021 1813 by Oris Drone, RN Outcome: Adequate for Discharge 10/09/2021 1141 by Oris Drone, RN Outcome: Progressing   Problem: Coping: Goal: Will verbalize positive feelings about self 10/09/2021 1813 by Oris Drone, RN Outcome: Adequate for Discharge 10/09/2021 1141 by Oris Drone, RN Outcome: Progressing Goal: Will identify appropriate support needs 10/09/2021 1813 by Oris Drone, RN Outcome: Adequate for Discharge 10/09/2021 1141 by Oris Drone, RN Outcome: Progressing   Problem: Health Behavior/Discharge Planning: Goal: Ability to manage health-related needs will improve 10/09/2021 1813 by Oris Drone, RN Outcome: Adequate for Discharge 10/09/2021 1141 by Oris Drone, RN Outcome: Progressing   Problem: Self-Care: Goal: Ability to participate in self-care as condition permits will improve 10/09/2021 1813 by Oris Drone, RN Outcome: Adequate for Discharge 10/09/2021 1141 by Oris Drone, RN Outcome: Progressing Goal: Verbalization of feelings and concerns over difficulty with self-care will improve 10/09/2021 1813 by Oris Drone, RN Outcome: Adequate for Discharge 10/09/2021 1141 by Oris Drone, RN Outcome: Progressing Goal: Ability to communicate needs accurately will improve 10/09/2021 1813 by Oris Drone, RN Outcome: Adequate for Discharge 10/09/2021 1141 by Oris Drone, RN Outcome: Progressing   Problem: Ischemic Stroke/TIA Tissue Perfusion: Goal: Complications of ischemic stroke/TIA will be minimized 10/09/2021 1813 by Oris Drone, RN Outcome: Adequate for Discharge 10/09/2021 1141 by Oris Drone, RN Outcome: Progressing   Problem: Education: Goal: Knowledge of General Education information will improve Description: Including pain rating scale, medication(s)/side effects and non-pharmacologic comfort measures 10/09/2021 1813 by Oris Drone, RN Outcome: Adequate for Discharge 10/09/2021 1141 by Oris Drone, RN Outcome: Progressing   Problem: Health Behavior/Discharge Planning: Goal: Ability to manage health-related needs will improve 10/09/2021 1813 by Oris Drone, RN Outcome: Adequate for Discharge 10/09/2021 1141 by Oris Drone, RN Outcome: Progressing   Problem: Clinical Measurements: Goal: Ability to maintain clinical measurements within normal limits will improve 10/09/2021 1813 by Oris Drone, RN Outcome: Adequate for Discharge 10/09/2021 1141 by Oris Drone, RN Outcome: Progressing Goal: Will remain free from infection 10/09/2021 1813 by Oris Drone, RN Outcome: Adequate for Discharge 10/09/2021 1141 by Oris Drone, RN Outcome: Progressing Goal: Diagnostic test results will improve 10/09/2021 1813 by Oris Drone, RN Outcome: Adequate for Discharge 10/09/2021 1141 by Oris Drone, RN Outcome: Progressing Goal: Respiratory complications will improve 10/09/2021 1813 by Oris Drone, RN Outcome: Adequate for Discharge 10/09/2021 1141 by Oris Drone, RN Outcome: Progressing Goal: Cardiovascular complication will be avoided 10/09/2021 1813 by Oris Drone, RN Outcome: Adequate for Discharge 10/09/2021 1141 by Oris Drone, RN Outcome: Progressing   Problem: Activity: Goal: Risk for activity intolerance will  decrease 10/09/2021 1813 by Oris Drone, RN Outcome: Adequate for Discharge 10/09/2021 1141 by Oris Drone, RN Outcome: Progressing   Problem: Nutrition: Goal: Adequate nutrition will be maintained 10/09/2021 1813 by Oris Drone, RN Outcome: Adequate for Discharge 10/09/2021 1141 by Oris Drone, RN Outcome: Progressing   Problem: Coping: Goal: Level of anxiety will  decrease 10/09/2021 1813 by Oris Drone, RN Outcome: Adequate for Discharge 10/09/2021 1141 by Oris Drone, RN Outcome: Progressing   Problem: Elimination: Goal: Will not experience complications related to bowel motility 10/09/2021 1813 by Oris Drone, RN Outcome: Adequate for Discharge 10/09/2021 1141 by Oris Drone, RN Outcome: Progressing Goal: Will not experience complications related to urinary retention 10/09/2021 1813 by Oris Drone, RN Outcome: Adequate for Discharge 10/09/2021 1141 by Oris Drone, RN Outcome: Progressing   Problem: Pain Managment: Goal: General experience of comfort will improve 10/09/2021 1813 by Oris Drone, RN Outcome: Adequate for Discharge 10/09/2021 1141 by Oris Drone, RN Outcome: Progressing   Problem: Safety: Goal: Ability to remain free from injury will improve 10/09/2021 1813 by Oris Drone, RN Outcome: Adequate for Discharge 10/09/2021 1141 by Oris Drone, RN Outcome: Progressing   Problem: Skin Integrity: Goal: Risk for impaired skin integrity will decrease 10/09/2021 1813 by Oris Drone, RN Outcome: Adequate for Discharge 10/09/2021 1141 by Oris Drone, RN Outcome: Progressing

## 2021-10-09 NOTE — Progress Notes (Signed)
*  PRELIMINARY RESULTS* Echocardiogram 2D Echocardiogram has been performed.  Sara Aguirre 10/09/2021, 11:15 AM

## 2021-10-10 LAB — ECHOCARDIOGRAM COMPLETE
AR max vel: 2.13 cm2
AV Area VTI: 2.05 cm2
AV Area mean vel: 2.44 cm2
AV Mean grad: 3.5 mmHg
AV Peak grad: 7.2 mmHg
Ao pk vel: 1.35 m/s
Area-P 1/2: 4.93 cm2
Height: 68 in
MV VTI: 2.28 cm2
S' Lateral: 2.1 cm
Weight: 2272 oz

## 2021-10-10 LAB — HEMOGLOBIN A1C
Hgb A1c MFr Bld: 5.6 % (ref 4.8–5.6)
Mean Plasma Glucose: 114 mg/dL

## 2021-10-10 NOTE — Telephone Encounter (Signed)
Pt son Elta Guadeloupe called in wants to know if pt should continue to take amLODipine (NORVASC) 5 MG tablet and losartan (COZAAR) 100 MG tablet  . Please  Advise . Would  like a call back #[phone]

## 2021-10-11 NOTE — Telephone Encounter (Signed)
Printed form and placed in your folder.

## 2021-10-13 NOTE — Telephone Encounter (Signed)
Printed and placed in your folder for review.

## 2021-10-18 NOTE — Telephone Encounter (Signed)
Pt son called and schedule an appt for his mother on 10/20/21

## 2021-10-20 ENCOUNTER — Ambulatory Visit (INDEPENDENT_AMBULATORY_CARE_PROVIDER_SITE_OTHER): Payer: PPO | Admitting: Primary Care

## 2021-10-20 ENCOUNTER — Encounter: Payer: Self-pay | Admitting: Primary Care

## 2021-10-20 ENCOUNTER — Other Ambulatory Visit: Payer: Self-pay

## 2021-10-20 VITALS — BP 148/80 | HR 93 | Temp 97.8°F | Ht 68.0 in | Wt 137.8 lb

## 2021-10-20 DIAGNOSIS — N1832 Chronic kidney disease, stage 3b: Secondary | ICD-10-CM

## 2021-10-20 DIAGNOSIS — I6523 Occlusion and stenosis of bilateral carotid arteries: Secondary | ICD-10-CM | POA: Diagnosis not present

## 2021-10-20 DIAGNOSIS — I10 Essential (primary) hypertension: Secondary | ICD-10-CM

## 2021-10-20 DIAGNOSIS — Z8673 Personal history of transient ischemic attack (TIA), and cerebral infarction without residual deficits: Secondary | ICD-10-CM | POA: Diagnosis not present

## 2021-10-20 DIAGNOSIS — E7800 Pure hypercholesterolemia, unspecified: Secondary | ICD-10-CM

## 2021-10-20 NOTE — Assessment & Plan Note (Signed)
Uncontrolled on current regimen.  Increase amlodipine to 10 mg. Her son will update in 2 weeks via MyChart.  Continue losartan 100 mg.

## 2021-10-20 NOTE — Assessment & Plan Note (Signed)
Following with nephrology.   Continue ARB. Working to gain better BP control.

## 2021-10-20 NOTE — Assessment & Plan Note (Signed)
Recent hospitalization for TIA, thought to be embolic.  LDL at goal, continue rosuvastatin 5 mg.  BP above goal, increase amlodipine 10 mg, continue losartan 100 mg.  Continue Plavix 75 mg and aspirin 81 mg.  Urgent referral placed to vascular services for surgical treatment.   Hospital notes, labs, imaging reviewed.

## 2021-10-20 NOTE — Assessment & Plan Note (Signed)
LDL at goal of <70. Continue Crestor 5 mg.

## 2021-10-20 NOTE — Progress Notes (Signed)
Subjective:    Patient ID: Sara Aguirre, female    DOB: [birth year], 80 y.o.   MRN: [id number]  HPI  Sara Aguirre is a very pleasant 80 y.o. female with a history of uncontrolled hypertension, TIA, CKD, bladder cancer, hyperlipidemia who presents today for hospital follow up.  She presented to [facility] ED on 10/08/21 for left upper extremity numbness and weakness. Symptoms resolved in triage prior to MD evaluation. Work up in the ED included negative CT head, hypertension with BP of 189/69, elevated troponin of 6. She was admitted for further evaluation.  During her hospital stay she underwent carotid dopplers (R>69% occlusion, L<50% occlusion), transthoracic echo, MRI/MRA brain (acute stroke to right cerebral hemisphere).  Neurology consulted who recommended continuation of aspirin and Plavix, loop recorder interrogation, vascular surgery consultation.   Vascular surgery consulted who recommended outpatient carotid artery stenting vs carotid artery endarterectomy within 1-2 weeks post discharge.  She was discharged home on 10/09/21 with recommendations for PCP follow up and vascular surgery for carotid stenosis. Neither the patient or her son have heard anything from vascular services since discharge.   She denies left arm heaviness and weakness, dizziness, headaches, changes in speech. She is compliant to all of her medications, including aspirin and Plavix. She is checking BP at home which is running mostly in the 140's-160's/80's-90's.   She denies ankle edema from amlodipine.   BP Readings from Last 3 Encounters:  10/20/21 (!) 148/80  10/09/21 (!) 153/59  08/25/21 (!) 148/82        Review of Systems  Respiratory:  Negative for shortness of breath.   Cardiovascular:  Negative for chest pain and leg swelling.  Neurological:  Negative for dizziness, speech difficulty, weakness and numbness.        Past Medical History:  Diagnosis Date   Cancer [facility])    bladder, s/p  cystectomy with ileal conduit   History of bladder cancer    History of tobacco use    Hyperlipidemia    Hypertension    Incarcerated ventral hernia, s/p lap repair 12/05/2011   Parastomal hernia of ileal conduit, s/p lap repair [id number] 12/04/2011   Ulcer, gastric, acute or chronic     Social History   Socioeconomic History   Marital status: Widowed    Spouse name: Not on file   Number of children: 2   Years of education: Not on file   Highest education level: Some college, no degree  Occupational History   Occupation: retired  Tobacco Use   Smoking status: Former    Types: Cigarettes    Quit date: 01/02/1993    Years since quitting: 28.8   Smokeless tobacco: Never  Vaping Use   Vaping Use: Never used  Substance and Sexual Activity   Alcohol use: No   Drug use: No   Sexual activity: Not on file  Other Topics Concern   Not on file  Social History Narrative   Marital status: widowed since 1992; not dating; not interested      Children:  Twin boys (65); 2 grandchildren      Lives: alone with dog, 5 chickens, 2 rabbits      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  Gardening      ADLs: drives; independent with ADLs; has garden  Advanced Directives: YES: full code but no prolonged measures; HCPOA: Orvis Brill or other son Cory Munch; copy on chart 04/2017.   Social Determinants of Health   Financial Resource Strain: Not on file  Food Insecurity: Not on file  Transportation Needs: Not on file  Physical Activity: Not on file  Stress: Not on file  Social Connections: Not on file  Intimate Partner Violence: Not on file    Past Surgical History:  Procedure Laterality Date   BLADDER REMOVAL  10/1993   Dr. Risa Grill   HERNIA REPAIR  12/05/11   parastomal   LOOP RECORDER INSERTION N/A 04/23/2020   Procedure: LOOP  RECORDER INSERTION;  Surgeon: Deboraha Sprang, MD;  Location: [facility];  Service: Cardiovascular;  Laterality: N/A;   PARASTOMAL HERNIA REPAIR  12/05/2011   Procedure: HERNIA REPAIR PARASTOMAL;  Surgeon: Adin Hector, MD;  Location: [facility];  Service: General;;  Laprascopic lysis of adhestons with reduction and repair with biological mesh for incarcerated parastomal and ventral long incisional hernia.   REVISION UROSTOMY CUTANEOUS     VENTRAL HERNIA REPAIR  12/05/2011   Procedure: HERNIA REPAIR VENTRAL ADULT;  Surgeon: Adin Hector, MD;  Location: [facility];  Service: General;;    Family History  Problem Relation Age of Onset   Heart disease Mother 46       CHF   Alcohol abuse Father    Stroke Father     Allergies  Allergen Reactions   Imodium [Loperamide]     Itchy all over   Loperamide Hcl Rash    Current Outpatient Medications on File Prior to Visit  Medication Sig Dispense Refill   amLODipine (NORVASC) 5 MG tablet Take 1 tablet (5 mg total) by mouth daily. For blood pressure. 90 tablet 2   aspirin EC 81 MG tablet Take 81 mg by mouth daily. Swallow whole.     Blood Pressure Monitor DEVI Use to check blood pressure 3 times a week 1 Device 0   clopidogrel (PLAVIX) 75 MG tablet Take 1 tablet (75 mg total) by mouth daily. For stroke 90 tablet 3   cyanocobalamin 1000 MCG tablet Take 1,000 mcg by mouth daily.     losartan (COZAAR) 100 MG tablet Take 100 mg by mouth daily.     rosuvastatin (CRESTOR) 5 MG tablet Take 1 tablet (5 mg total) by mouth daily. For cholesterol. 90 tablet 3   UNABLE TO FIND HOLISTER UROSTOMY POUCHES ITEM 8478 100 Device 3   No current facility-administered medications on file prior to visit.    BP (!) 148/80   Pulse 93   Temp 97.8 F (36.6 C) (Temporal)   Ht 5\' 8"  (1.727 m)   Wt 137 lb 12.8 oz (62.5 kg)   SpO2 97%   BMI 20.95 kg/m  Objective:   Physical Exam Eyes:     Extraocular Movements: Extraocular movements intact.   Cardiovascular:     Rate and Rhythm: Normal rate and regular rhythm.  Pulmonary:     Effort: Pulmonary effort is normal.     Breath sounds: Normal breath sounds.  Musculoskeletal:     Cervical back: Neck supple.  Skin:    General: Skin is warm and dry.  Neurological:     Mental Status: She is alert and oriented to person, place, and time.     Cranial Nerves: No cranial nerve deficit.     Motor: No weakness.          Assessment & Plan:  This visit occurred during the SARS-CoV-2 public health emergency.  Safety protocols were in place, including screening questions prior to the visit, additional usage of staff PPE, and extensive cleaning of exam room while observing appropriate contact time as indicated for disinfecting solutions.

## 2021-10-20 NOTE — Patient Instructions (Signed)
Increase your amlodipine to 10 mg blood pressure medication, take 2 tablets daily and update me via MyChart in 2 weeks.   Continue all other medications.   You will be contacted regarding your referral to vascular services  Please let us know if you have not been contacted within two days.   It was a pleasure to see you today!

## 2021-10-20 NOTE — Assessment & Plan Note (Addendum)
Carotid dopplers reviewed. Bruit noted on exam bilaterally.  Continue statin, Plavix, aspirin.  Urgent referral sent to vascular services for surgical procedure.

## 2021-10-24 ENCOUNTER — Other Ambulatory Visit: Payer: Self-pay

## 2021-10-24 ENCOUNTER — Encounter (INDEPENDENT_AMBULATORY_CARE_PROVIDER_SITE_OTHER): Payer: Self-pay | Admitting: Nurse Practitioner

## 2021-10-24 ENCOUNTER — Ambulatory Visit (INDEPENDENT_AMBULATORY_CARE_PROVIDER_SITE_OTHER): Payer: PPO | Admitting: Nurse Practitioner

## 2021-10-24 VITALS — BP 160/71 | HR 90 | Ht 68.0 in | Wt 138.0 lb

## 2021-10-24 DIAGNOSIS — I6523 Occlusion and stenosis of bilateral carotid arteries: Secondary | ICD-10-CM | POA: Diagnosis not present

## 2021-10-24 DIAGNOSIS — N289 Disorder of kidney and ureter, unspecified: Secondary | ICD-10-CM | POA: Diagnosis not present

## 2021-10-24 DIAGNOSIS — I10 Essential (primary) hypertension: Secondary | ICD-10-CM

## 2021-10-24 NOTE — Progress Notes (Signed)
Subjective:    Patient ID: Sara Aguirre, female    DOB: [birth year], 80 y.o.   MRN: [id number] Chief Complaint  Patient presents with   New Patient (Initial Visit)    NP consult 80 year old female recent hospitalization for TIA. Had >69% right craniostenosis and <50%  Lt craniostenosis. Vascular services recommend  surgical treatment  referred by  Alma Friendly    Sara Aguirre is an 80 year old female that presents today after recent TIA.  The patient had numbness and weakness of her left side but it resolved shortly after arriving at the hospital.  The symptoms subsided within a few minutes.  A carotid duplex done at [facility] noted a greater than 69% stenosis of the right ICA however not advanced enough to be a string sign.  It is also noted that the left side has less than 50% stenosis.  Since that time the patient notes that she has little numbness in her fingertips but otherwise fairly back to normal.   Review of Systems  Neurological:  Positive for weakness and numbness.  All other systems reviewed and are negative.     Objective:   Physical Exam Vitals reviewed.  HENT:     Head: Normocephalic.  Cardiovascular:     Rate and Rhythm: Normal rate.  Pulmonary:     Effort: Pulmonary effort is normal.  Neurological:     Mental Status: She is alert and oriented to person, place, and time.  Psychiatric:        Mood and Affect: Mood normal.        Behavior: Behavior normal.        Thought Content: Thought content normal.        Judgment: Judgment normal.    BP (!) 160/71   Pulse 90   Ht 5\' 8"  (1.727 m)   Wt 138 lb (62.6 kg)   BMI 20.98 kg/m   Past Medical History:  Diagnosis Date   Cancer ([place])    bladder, s/p cystectomy with ileal conduit   History of bladder cancer    History of tobacco use    Hyperlipidemia    Hypertension    Incarcerated ventral hernia, s/p lap repair 12/05/2011   Parastomal hernia of ileal conduit, s/p lap repair  [id number] 12/04/2011   Ulcer, gastric, acute or chronic     Social History   Socioeconomic History   Marital status: Widowed    Spouse name: Not on file   Number of children: 2   Years of education: Not on file   Highest education level: Some college, no degree  Occupational History   Occupation: retired  Tobacco Use   Smoking status: Former    Types: Cigarettes    Quit date: 01/02/1993    Years since quitting: 28.8   Smokeless tobacco: Never  Vaping Use   Vaping Use: Never used  Substance and Sexual Activity   Alcohol use: No   Drug use: No   Sexual activity: Not on file  Other Topics Concern   Not on file  Social History Narrative   Marital status: widowed since 1992; not dating; not interested      Children:  Twin boys (30); 2 grandchildren      Lives: alone with dog, 5 chickens, 2 rabbits      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  Gardening      ADLs: drives; independent with ADLs; has garden      Advanced Directives: YES: full code but no prolonged measures; HCPOA: Orvis Brill or other son Cory Munch; copy on chart 04/2017.   Social Determinants of Health   Financial Resource Strain: Not on file  Food Insecurity: Not on file  Transportation Needs: Not on file  Physical Activity: Not on file  Stress: Not on file  Social Connections: Not on file  Intimate Partner Violence: Not on file    Past Surgical History:  Procedure Laterality Date   BLADDER REMOVAL  10/1993   Dr. Risa Grill   HERNIA REPAIR  12/05/11   parastomal   LOOP RECORDER INSERTION N/A 04/23/2020   Procedure: LOOP RECORDER INSERTION;  Surgeon: Deboraha Sprang, MD;  Location: [facility];  Service: Cardiovascular;  Laterality: N/A;   PARASTOMAL HERNIA REPAIR  12/05/2011   Procedure: HERNIA REPAIR PARASTOMAL;  Surgeon: Adin Hector, MD;   Location: [facility];  Service: General;;  Laprascopic lysis of adhestons with reduction and repair with biological mesh for incarcerated parastomal and ventral long incisional hernia.   REVISION UROSTOMY CUTANEOUS     VENTRAL HERNIA REPAIR  12/05/2011   Procedure: HERNIA REPAIR VENTRAL ADULT;  Surgeon: Adin Hector, MD;  Location: [facility];  Service: General;;    Family History  Problem Relation Age of Onset   Heart disease Mother 23       CHF   Alcohol abuse Father    Stroke Father     Allergies  Allergen Reactions   Imodium [Loperamide]     Itchy all over   Loperamide Hcl Rash    CBC Latest Ref Rng & Units 10/08/2021 08/11/2021 11/04/2020  WBC 4.0 - 10.5 K/uL 8.8 9.0 8.2  Hemoglobin 12.0 - 15.0 g/dL 13.7 13.3 13.8  Hematocrit 36.0 - 46.0 % 41.4 40.5 42.8  Platelets 150 - 400 K/uL 264 267.0 313      CMP     Component Value Date/Time   NA 139 10/08/2021 1442   NA 140 12/23/2020 1101   K 4.2 10/08/2021 1442   CL 101 10/08/2021 1442   CO2 29 10/08/2021 1442   GLUCOSE 138 (H) 10/08/2021 1442   BUN 18 10/08/2021 1442   BUN 18 12/23/2020 1101   CREATININE 1.54 (H) 10/08/2021 1442   CREATININE 1.46 (H) 09/29/2015 0854   CALCIUM 9.7 10/08/2021 1442   PROT 7.4 10/08/2021 1442   PROT 6.9 12/23/2020 1101   ALBUMIN 3.8 10/08/2021 1442   ALBUMIN 3.9 12/23/2020 1101   AST 20 10/08/2021 1442   ALT 10 10/08/2021 1442   ALKPHOS 59 10/08/2021 1442   BILITOT 0.7 10/08/2021 1442   BILITOT 0.4 12/23/2020 1101   GFRNONAA 34 (L) 10/08/2021 1442   GFRNONAA 35 (L) 09/29/2015 0854   GFRAA 39 (L) 12/23/2020 1101   GFRAA 40 (L) 09/29/2015 0854     No results found.     Assessment & Plan:   1. Bilateral carotid artery stenosis Noninvasive studies done at [facility] show a greater than 69% stenosis of the right ICA but it is noted that it is less than fully occluded.  There is also a less than 50% stenosis of the left ICA.  Given that the patient symptoms are  on her left side a high-grade right ICA lesion is likely.  We discussed a neck  step of a CT angiogram however there is concern with patient's kidney function.  We also discussed a carotid angiogram due to this as well.  The patient will contact her office to move forward with the decision as noted below.  2. Primary hypertension Continue antihypertensive medications as already ordered, these medications have been reviewed and there are no changes at this time. Continue antihypertensive medications as already ordered, these medications have been reviewed and there are no changes at this time.   3. Renal insufficiency The patient has an upcoming appointment with her nephrologist Dr. Juleen China.  She would like to discuss the options with him.  We discussed a CT angiogram which would require more dye but be less invasive for the patient as well as take less time to perform.  A carotid angiogram however will use less dye but, it is more invasive.  Patient and family are advised to discuss with Dr. Juleen China and contact our office to let us know which option they prefer to proceed with.   Current Outpatient Medications on File Prior to Visit  Medication Sig Dispense Refill   amLODipine (NORVASC) 5 MG tablet Take 1 tablet (5 mg total) by mouth daily. For blood pressure. 90 tablet 2   aspirin EC 81 MG tablet Take 81 mg by mouth daily. Swallow whole.     Blood Pressure Monitor DEVI Use to check blood pressure 3 times a week 1 Device 0   clopidogrel (PLAVIX) 75 MG tablet Take 1 tablet (75 mg total) by mouth daily. For stroke 90 tablet 3   cyanocobalamin 1000 MCG tablet Take 1,000 mcg by mouth daily.     losartan (COZAAR) 100 MG tablet Take 100 mg by mouth daily.     rosuvastatin (CRESTOR) 5 MG tablet Take 1 tablet (5 mg total) by mouth daily. For cholesterol. 90 tablet 3   UNABLE TO FIND HOLISTER UROSTOMY POUCHES ITEM 8478 100 Device 3   No current facility-administered medications on file prior to visit.     There are no Patient Instructions on file for this visit. No follow-ups on file.   Kris Hartmann, NP

## 2021-10-25 ENCOUNTER — Encounter (INDEPENDENT_AMBULATORY_CARE_PROVIDER_SITE_OTHER): Payer: Self-pay

## 2021-10-25 DIAGNOSIS — R319 Hematuria, unspecified: Secondary | ICD-10-CM | POA: Diagnosis not present

## 2021-10-25 DIAGNOSIS — R809 Proteinuria, unspecified: Secondary | ICD-10-CM | POA: Diagnosis not present

## 2021-10-25 DIAGNOSIS — N184 Chronic kidney disease, stage 4 (severe): Secondary | ICD-10-CM | POA: Diagnosis not present

## 2021-10-25 DIAGNOSIS — I129 Hypertensive chronic kidney disease with stage 1 through stage 4 chronic kidney disease, or unspecified chronic kidney disease: Secondary | ICD-10-CM | POA: Diagnosis not present

## 2021-10-26 ENCOUNTER — Telehealth (INDEPENDENT_AMBULATORY_CARE_PROVIDER_SITE_OTHER): Payer: Self-pay

## 2021-10-26 ENCOUNTER — Other Ambulatory Visit: Payer: PPO

## 2021-10-26 DIAGNOSIS — I10 Essential (primary) hypertension: Secondary | ICD-10-CM

## 2021-10-26 NOTE — Telephone Encounter (Signed)
Spoke with the patient's son and the patient is scheduled for a carotid angio with Dr. Delana Meyer on 11/08/21 with a 11:00 am arrival time to the [facility]. Pre-procedure instructions were discussed and will be mailed.

## 2021-10-27 MED ORDER — AMLODIPINE BESYLATE 10 MG PO TABS: 10.0000 mg | ORAL_TABLET | Freq: Every day | ORAL | 3 refills | Status: DC

## 2021-11-02 DIAGNOSIS — Z936 Other artificial openings of urinary tract status: Secondary | ICD-10-CM | POA: Diagnosis not present

## 2021-11-07 ENCOUNTER — Other Ambulatory Visit (INDEPENDENT_AMBULATORY_CARE_PROVIDER_SITE_OTHER): Payer: Self-pay | Admitting: Nurse Practitioner

## 2021-11-07 DIAGNOSIS — I6523 Occlusion and stenosis of bilateral carotid arteries: Secondary | ICD-10-CM

## 2021-11-08 ENCOUNTER — Encounter
Admission: RE | Disposition: A | Discharge status: Home / Self Care | Payer: Self-pay | Source: Home / Self Care | Attending: Vascular Surgery

## 2021-11-08 ENCOUNTER — Other Ambulatory Visit: Payer: Self-pay

## 2021-11-08 ENCOUNTER — Encounter: Payer: Self-pay | Admitting: Vascular Surgery

## 2021-11-08 ENCOUNTER — Inpatient Hospital Stay
Admission: RE | Admit: 2021-11-08 | Discharge: 2021-11-09 | DRG: 027 | Disposition: A | Discharge status: Home / Self Care | Payer: PPO | Attending: Vascular Surgery | Admitting: Vascular Surgery

## 2021-11-08 DIAGNOSIS — N289 Disorder of kidney and ureter, unspecified: Secondary | ICD-10-CM | POA: Diagnosis present

## 2021-11-08 DIAGNOSIS — I6529 Occlusion and stenosis of unspecified carotid artery: Secondary | ICD-10-CM

## 2021-11-08 DIAGNOSIS — Z87891 Personal history of nicotine dependence: Secondary | ICD-10-CM | POA: Diagnosis not present

## 2021-11-08 DIAGNOSIS — I6523 Occlusion and stenosis of bilateral carotid arteries: Secondary | ICD-10-CM

## 2021-11-08 DIAGNOSIS — I10 Essential (primary) hypertension: Secondary | ICD-10-CM | POA: Diagnosis present

## 2021-11-08 DIAGNOSIS — Z7902 Long term (current) use of antithrombotics/antiplatelets: Secondary | ICD-10-CM | POA: Diagnosis not present

## 2021-11-08 DIAGNOSIS — E785 Hyperlipidemia, unspecified: Secondary | ICD-10-CM | POA: Diagnosis present

## 2021-11-08 DIAGNOSIS — Z8551 Personal history of malignant neoplasm of bladder: Secondary | ICD-10-CM | POA: Diagnosis not present

## 2021-11-08 DIAGNOSIS — I6521 Occlusion and stenosis of right carotid artery: Principal | ICD-10-CM | POA: Diagnosis present

## 2021-11-08 DIAGNOSIS — Z79899 Other long term (current) drug therapy: Secondary | ICD-10-CM | POA: Diagnosis not present

## 2021-11-08 DIAGNOSIS — Z7982 Long term (current) use of aspirin: Secondary | ICD-10-CM | POA: Diagnosis not present

## 2021-11-08 DIAGNOSIS — I2510 Atherosclerotic heart disease of native coronary artery without angina pectoris: Secondary | ICD-10-CM | POA: Diagnosis present

## 2021-11-08 DIAGNOSIS — Z8249 Family history of ischemic heart disease and other diseases of the circulatory system: Secondary | ICD-10-CM

## 2021-11-08 DIAGNOSIS — Z8673 Personal history of transient ischemic attack (TIA), and cerebral infarction without residual deficits: Secondary | ICD-10-CM | POA: Diagnosis not present

## 2021-11-08 DIAGNOSIS — I63239 Cerebral infarction due to unspecified occlusion or stenosis of unspecified carotid arteries: Secondary | ICD-10-CM | POA: Diagnosis present

## 2021-11-08 DIAGNOSIS — Z811 Family history of alcohol abuse and dependence: Secondary | ICD-10-CM

## 2021-11-08 DIAGNOSIS — Z823 Family history of stroke: Secondary | ICD-10-CM

## 2021-11-08 DIAGNOSIS — Z20822 Contact with and (suspected) exposure to covid-19: Secondary | ICD-10-CM | POA: Diagnosis present

## 2021-11-08 DIAGNOSIS — Z888 Allergy status to other drugs, medicaments and biological substances status: Secondary | ICD-10-CM

## 2021-11-08 HISTORY — PX: CAROTID PTA/STENT INTERVENTION: CATH118231

## 2021-11-08 LAB — GLUCOSE, CAPILLARY
Glucose-Capillary: 61 mg/dL — ABNORMAL LOW (ref 70–99)
Glucose-Capillary: 151 mg/dL — ABNORMAL HIGH (ref 70–99)
Glucose-Capillary: 119 mg/dL — ABNORMAL HIGH (ref 70–99)

## 2021-11-08 LAB — CREATININE, SERUM
Creatinine, Ser: 1.29 mg/dL — ABNORMAL HIGH (ref 0.44–1.00)
GFR, Estimated: 41 mL/min — ABNORMAL LOW (ref >60)

## 2021-11-08 LAB — MRSA NEXT GEN BY PCR, NASAL: MRSA by PCR Next Gen: NOT DETECTED

## 2021-11-08 LAB — BUN: BUN: 20 mg/dL (ref 8–23)

## 2021-11-08 SURGERY — CAROTID PTA/STENT INTERVENTION
Anesthesia: Moderate Sedation | Laterality: Right

## 2021-11-08 MED ORDER — MORPHINE SULFATE (PF) 4 MG/ML IV SOLN: 2.0000 mg | INTRAVENOUS | Status: DC | PRN

## 2021-11-08 MED ORDER — IODIXANOL 320 MG/ML IV SOLN
INTRAVENOUS | Status: DC | PRN
  Administered 2021-11-08: 65 mL

## 2021-11-08 MED ORDER — ASPIRIN EC 81 MG PO TBEC
81.0000 mg | DELAYED_RELEASE_TABLET | Freq: Once | ORAL | Status: AC
  Administered 2021-11-09: 81 mg via ORAL
  Filled 2021-11-08: qty 1

## 2021-11-08 MED ORDER — VITAMIN B-12 1000 MCG PO TABS
1000.0000 ug | ORAL_TABLET | Freq: Every day | ORAL | Status: DC
  Administered 2021-11-09: 1000 ug via ORAL
  Filled 2021-11-08: qty 1

## 2021-11-08 MED ORDER — PHENYLEPHRINE HCL-NACL 20-0.9 MG/250ML-% IV SOLN
INTRAVENOUS | Status: AC
  Filled 2021-11-08: qty 250

## 2021-11-08 MED ORDER — ONDANSETRON HCL 4 MG/2ML IJ SOLN
4.0000 mg | Freq: Four times a day (QID) | INTRAMUSCULAR | Status: DC | PRN
  Administered 2021-11-08: 4 mg via INTRAVENOUS
  Filled 2021-11-08: qty 2

## 2021-11-08 MED ORDER — DEXTROSE 50 % IV SOLN
INTRAVENOUS | Status: AC
  Administered 2021-11-08: 50 mL
  Filled 2021-11-08: qty 50

## 2021-11-08 MED ORDER — MIDAZOLAM HCL 2 MG/ML PO SYRP: 8.0000 mg | ORAL_SOLUTION | Freq: Once | ORAL | Status: DC | PRN

## 2021-11-08 MED ORDER — ALUM & MAG HYDROXIDE-SIMETH 200-200-20 MG/5ML PO SUSP: 15.0000 mL | ORAL | Status: DC | PRN

## 2021-11-08 MED ORDER — PHENOL 1.4 % MT LIQD
1.0000 | OROMUCOSAL | Status: DC | PRN
  Filled 2021-11-08: qty 177

## 2021-11-08 MED ORDER — PHENYLEPHRINE 40 MCG/ML (10ML) SYRINGE FOR IV PUSH (FOR BLOOD PRESSURE SUPPORT)
PREFILLED_SYRINGE | INTRAVENOUS | Status: AC
  Filled 2021-11-08: qty 10

## 2021-11-08 MED ORDER — ATROPINE SULFATE 1 MG/10ML IJ SOSY
PREFILLED_SYRINGE | INTRAMUSCULAR | Status: AC
  Filled 2021-11-08: qty 10

## 2021-11-08 MED ORDER — LABETALOL HCL 5 MG/ML IV SOLN: 10.0000 mg | INTRAVENOUS | Status: DC | PRN

## 2021-11-08 MED ORDER — MIDAZOLAM HCL 5 MG/5ML IJ SOLN
INTRAMUSCULAR | Status: AC
  Filled 2021-11-08: qty 5

## 2021-11-08 MED ORDER — SODIUM CHLORIDE 0.9 % IV SOLN: 500.0000 mL | Freq: Once | INTRAVENOUS | Status: DC | PRN

## 2021-11-08 MED ORDER — METOPROLOL TARTRATE 5 MG/5ML IV SOLN: 2.0000 mg | INTRAVENOUS | Status: DC | PRN

## 2021-11-08 MED ORDER — HYDROMORPHONE HCL 1 MG/ML IJ SOLN: 1.0000 mg | Freq: Once | INTRAMUSCULAR | Status: DC | PRN

## 2021-11-08 MED ORDER — MIDAZOLAM HCL 2 MG/2ML IJ SOLN
INTRAMUSCULAR | Status: DC | PRN
  Administered 2021-11-08 (×3): 1 mg via INTRAVENOUS

## 2021-11-08 MED ORDER — CEFAZOLIN SODIUM-DEXTROSE 2-4 GM/100ML-% IV SOLN
2.0000 g | Freq: Three times a day (TID) | INTRAVENOUS | Status: AC
  Administered 2021-11-08 (×2): 2 g via INTRAVENOUS
  Filled 2021-11-08 (×3): qty 100

## 2021-11-08 MED ORDER — CEFAZOLIN SODIUM-DEXTROSE 2-4 GM/100ML-% IV SOLN
INTRAVENOUS | Status: AC
  Filled 2021-11-08: qty 100

## 2021-11-08 MED ORDER — CEFAZOLIN SODIUM-DEXTROSE 2-4 GM/100ML-% IV SOLN
2.0000 g | Freq: Once | INTRAVENOUS | Status: AC
  Administered 2021-11-08: 2 g via INTRAVENOUS

## 2021-11-08 MED ORDER — AMLODIPINE BESYLATE 5 MG PO TABS
10.0000 mg | ORAL_TABLET | Freq: Every day | ORAL | Status: DC
  Administered 2021-11-09: 10 mg via ORAL
  Filled 2021-11-08: qty 2

## 2021-11-08 MED ORDER — PANTOPRAZOLE SODIUM 40 MG IV SOLR
40.0000 mg | Freq: Every day | INTRAVENOUS | Status: DC
  Administered 2021-11-08: 40 mg via INTRAVENOUS
  Filled 2021-11-08: qty 40

## 2021-11-08 MED ORDER — HYDRALAZINE HCL 20 MG/ML IJ SOLN: 5.0000 mg | INTRAMUSCULAR | Status: DC | PRN

## 2021-11-08 MED ORDER — DIPHENHYDRAMINE HCL 50 MG/ML IJ SOLN: 50.0000 mg | Freq: Once | INTRAMUSCULAR | Status: DC | PRN

## 2021-11-08 MED ORDER — FAMOTIDINE 20 MG PO TABS: 40.0000 mg | ORAL_TABLET | Freq: Once | ORAL | Status: DC | PRN

## 2021-11-08 MED ORDER — OXYCODONE-ACETAMINOPHEN 5-325 MG PO TABS: 1.0000 | ORAL_TABLET | ORAL | Status: DC | PRN

## 2021-11-08 MED ORDER — ACETAMINOPHEN 325 MG RE SUPP
325.0000 mg | RECTAL | Status: DC | PRN
  Filled 2021-11-08: qty 2

## 2021-11-08 MED ORDER — ONDANSETRON HCL 4 MG/2ML IJ SOLN: 4.0000 mg | Freq: Four times a day (QID) | INTRAMUSCULAR | Status: DC | PRN

## 2021-11-08 MED ORDER — CHLORHEXIDINE GLUCONATE CLOTH 2 % EX PADS
6.0000 | MEDICATED_PAD | Freq: Every day | CUTANEOUS | Status: DC
  Administered 2021-11-08: 6 via TOPICAL

## 2021-11-08 MED ORDER — SODIUM CHLORIDE 0.9 % IV SOLN: INTRAVENOUS | Status: DC

## 2021-11-08 MED ORDER — GUAIFENESIN-DM 100-10 MG/5ML PO SYRP: 15.0000 mL | ORAL_SOLUTION | ORAL | Status: DC | PRN

## 2021-11-08 MED ORDER — ATROPINE SULFATE 1 MG/ML IV SOLN
INTRAVENOUS | Status: DC | PRN
  Administered 2021-11-08: 1 mg via INTRAVENOUS

## 2021-11-08 MED ORDER — ROSUVASTATIN CALCIUM 5 MG PO TABS
5.0000 mg | ORAL_TABLET | Freq: Every day | ORAL | Status: DC
  Administered 2021-11-09: 5 mg via ORAL
  Filled 2021-11-08: qty 1

## 2021-11-08 MED ORDER — METHYLPREDNISOLONE SODIUM SUCC 125 MG IJ SOLR: 125.0000 mg | Freq: Once | INTRAMUSCULAR | Status: DC | PRN

## 2021-11-08 MED ORDER — FENTANYL CITRATE PF 50 MCG/ML IJ SOSY
PREFILLED_SYRINGE | INTRAMUSCULAR | Status: AC
  Filled 2021-11-08: qty 1

## 2021-11-08 MED ORDER — ACETAMINOPHEN 325 MG PO TABS: 325.0000 mg | ORAL_TABLET | ORAL | Status: DC | PRN

## 2021-11-08 MED ORDER — HEPARIN SODIUM (PORCINE) 1000 UNIT/ML IJ SOLN
INTRAMUSCULAR | Status: AC
  Filled 2021-11-08: qty 1

## 2021-11-08 MED ORDER — KCL IN DEXTROSE-NACL 20-5-0.9 MEQ/L-%-% IV SOLN
INTRAVENOUS | Status: DC
  Filled 2021-11-08 (×5): qty 1000

## 2021-11-08 MED ORDER — LOSARTAN POTASSIUM 50 MG PO TABS
100.0000 mg | ORAL_TABLET | Freq: Every day | ORAL | Status: DC
  Administered 2021-11-09: 100 mg via ORAL
  Filled 2021-11-08: qty 2

## 2021-11-08 MED ORDER — ASPIRIN EC 81 MG PO TBEC
81.0000 mg | DELAYED_RELEASE_TABLET | Freq: Every day | ORAL | Status: DC
  Administered 2021-11-09: 81 mg via ORAL
  Filled 2021-11-08: qty 1

## 2021-11-08 MED ORDER — FENTANYL CITRATE (PF) 100 MCG/2ML IJ SOLN
INTRAMUSCULAR | Status: DC | PRN
  Administered 2021-11-08: 50 ug via INTRAVENOUS
  Administered 2021-11-08 (×2): 25 ug via INTRAVENOUS

## 2021-11-08 MED ORDER — DOCUSATE SODIUM 100 MG PO CAPS
100.0000 mg | ORAL_CAPSULE | Freq: Every day | ORAL | Status: DC
  Filled 2021-11-08: qty 1

## 2021-11-08 MED ORDER — HEPARIN SODIUM (PORCINE) 1000 UNIT/ML IJ SOLN
INTRAMUSCULAR | Status: DC | PRN
  Administered 2021-11-08: 2000 [IU] via INTRAVENOUS
  Administered 2021-11-08: 8000 [IU] via INTRAVENOUS

## 2021-11-08 MED ORDER — CLOPIDOGREL BISULFATE 75 MG PO TABS
75.0000 mg | ORAL_TABLET | Freq: Every day | ORAL | Status: DC
  Administered 2021-11-09: 75 mg via ORAL
  Filled 2021-11-08: qty 1

## 2021-11-08 SURGICAL SUPPLY — 28 items
BALLN VTRAC 4.5X30X135 (BALLOONS) ×2
BALLOON VTRAC 4.5X30X135 (BALLOONS) ×1 IMPLANT
CANNULA 5F STIFF (CANNULA) ×2 IMPLANT
CATH ANGIO 5F PIGTAIL 100CM (CATHETERS) ×2 IMPLANT
CATH NAVICROSS ANGLED 135CM (MICROCATHETER) ×2 IMPLANT
CATH SIM1 5FR 125CM (CATHETERS) ×2 IMPLANT
CATH VERT 5FR 125CM (CATHETERS) ×2 IMPLANT
COVER DRAPE FLUORO 36X44 (DRAPES) ×2 IMPLANT
COVER PROBE U/S 5X48 (MISCELLANEOUS) ×2 IMPLANT
DEVICE EMBOSHIELD NAV6 4.0-7.0 (FILTER) ×2 IMPLANT
DEVICE SAFEGUARD 24CM (GAUZE/BANDAGES/DRESSINGS) ×2 IMPLANT
DEVICE STARCLOSE SE CLOSURE (Vascular Products) ×2 IMPLANT
DEVICE TORQUE .025-.038 (MISCELLANEOUS) ×2 IMPLANT
GAUZE SPONGE 4X4 12PLY STRL (GAUZE/BANDAGES/DRESSINGS) ×4 IMPLANT
GLIDECATH ANGLED 4FR 120CM (CATHETERS) ×2 IMPLANT
GLIDEWIRE ANGLED SS 035X260CM (WIRE) ×2 IMPLANT
GUIDEWIRE VASC STIFF .038X260 (WIRE) ×2 IMPLANT
KIT CAROTID MANIFOLD (MISCELLANEOUS) ×2 IMPLANT
KIT ENCORE 26 ADVANTAGE (KITS) ×2 IMPLANT
PACK ANGIOGRAPHY (CUSTOM PROCEDURE TRAY) ×2 IMPLANT
SHEATH BRITE TIP 5FRX11 (SHEATH) ×2 IMPLANT
SHEATH SHUTTLE 7FR (SHEATH) ×2 IMPLANT
STENT XACT CAR 10-8X40X136 (Permanent Stent) ×2 IMPLANT
SYR MEDRAD MARK 7 150ML (SYRINGE) ×2 IMPLANT
TOWEL OR 17X26 4PK STRL BLUE (TOWEL DISPOSABLE) ×4 IMPLANT
TUBING CONTRAST HIGH PRESS 72 (TUBING) ×2 IMPLANT
WIRE AMPLATZ SSTIFF .035X260CM (WIRE) ×2 IMPLANT
WIRE GUIDERIGHT .035X150 (WIRE) ×2 IMPLANT

## 2021-11-08 NOTE — Op Note (Signed)
OPERATIVE NOTE DATE: 11/08/2021  PROCEDURE:  Ultrasound guidance for vascular access right femoral artery  Placement of a 10 x 8 x 40 exact stent with the use of the NAV-6 embolic protection device in the right internal carotid artery  PRE-OPERATIVE DIAGNOSIS: 1.  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 2.  Right hemispheric CVA  POST-OPERATIVE DIAGNOSIS:  Same as above  SURGEON: Hortencia Pilar, MD  ASSISTANT(S): None  ANESTHESIA: local/MCS  ESTIMATED BLOOD LOSS: 50 cc  CONTRAST: 65 cc  FLUORO TIME: 12.2 minutes  MODERATE CONSCIOUS SEDATION TIME: Continuous ECG pulse oximetry and cardiopulmonary monitoring was performed throughout the entire procedure by the interventional radiology nurse.  Parenteral Versed and fentanyl were utilized.  Total sedation time was 1 hour 11 minutes and 44 seconds.  FINDING(S): 1.   Greater than 80% right internal carotid artery stenosis.. 2.   Type III aortic arch  SPECIMEN(S):   none  INDICATIONS:   Patient is a 80 y.o. female who presents with symptomatic critical right carotid artery stenosis.  The patient has had a recent CVA.  She has a very high bifurcation and significant comorbidities and carotid artery stenting was felt to be preferred to endarterectomy for that reason.  Risks and benefits were discussed and informed consent was obtained.   DESCRIPTION: After obtaining full informed written consent, the patient was brought back to the vascular suite and placed supine upon the table.  The patient received IV antibiotics prior to induction. Moderate conscious sedation was administered during a face to face encounter with the patient throughout the procedure with my supervision of the RN administering medicines and monitoring the patients vital signs and mental status throughout from the start of the procedure until the patient was taken to the recovery room.  After obtaining adequate anesthesia, the patient  was prepped and draped in the standard fashion.   The right femoral artery was visualized with ultrasound and found to be widely patent. It was then accessed under direct ultrasound guidance without difficulty with a Seldinger needle. A permanent image was recorded. A J-wire was placed and we then placed a 6 French sheath. The patient was then heparinized and a total of 10,000 units of intravenous heparin were given and an ACT was checked to confirm successful anticoagulation. A pigtail catheter was then placed into the ascending aorta. This showed a type III aortic arch with the innominate rotated well over the apex of the arch.  Using a stiff angled Glidewire I then selectively cannulated the innominate without difficulty with a Simmons 1 catheter.  However, given the anatomy the [facility]: [place] Rhonda W 1 catheter would not track into the common carotid artery.  I reselected the innominate with a Rosita Fire and this time advanced the stiff angle Glidewire into the subclavian artery and ultimately the axillary artery.  I was then able to advance the [facility] catheter so that the tip was located in the right axillary artery.  Next, I advanced an Amplatz wire through the [facility] catheter and remove the [facility] catheter.  A 7 French shuttle sheath was then advanced over the Amplatz wire and positioned with its tip just distal to the origin of the innominate artery.  I introduced the stiff angled Glidewire through the 7 French sheath and a buddy wire fashion and was able to select the right common carotid.  I was then able to advance a Ferd Hibbs cross catheter over the wire.  The Glidewire was removed and hand-injection of contrast was utilized to  demonstrate the carotid anatomy.  I completed my cervical and intracranial imaging and then did a magnified image of the carotid bifurcation.    Angiography demonstrated greater than 80% stenosis within the right internal carotid artery.  There is a string sign noted at the origin of the right  external carotid artery.  Distal to these lesions there are atherosclerotic changes but I do not identify hemodynamically significant stenosis.  The innominate and right common carotid artery are widely patent.  I then reintroduced the Glidewire and selected the external carotid artery.  I advanced a Ferd Hibbs cross into the external carotid artery.  Unfortunately attempts at advancing the Amplatz wire through the [place] cross were not successful and the wire and catheter were removed.  I then reselected the common carotid with the Glidewire and this time advanced a 4 French angled glide catheter.  Again a magnified image of the carotid bifurcation was performed I selected the external carotid artery and advanced the glide catheter into the external.  The angled glide catheter used to exchanged for the Amplatz Super Stiff wire.  At this point the initial Amplatz wire which was in the subclavian and axillary arteries is removed.  Next, over the Amplatz Super Stiff wire located in the carotid artery, the 7 [place] shuttle sheath dilator was reinserted and the sheath and dilator advanced into the mid common carotid artery under fluoroscopic guidance. I then used the NAV-6  Embolic protection device and crossed the lesion and parked this in the distal internal carotid artery at the base of the skull.  I then selected a 10 x 8 x 40 Exact stent. This was deployed across the lesion encompassing it in its entirety. A 4.5 mm x 30 mm length balloon was used to post dilate the stent. Only about a 10% residual stenosis was present after angioplasty. Completion angiogram showed normal intracranial filling without new defects. At this point I elected to terminate the procedure. The sheath was removed and StarClose closure device was deployed in the right femoral artery with excellent hemostatic result. The patient was taken to the recovery room in stable condition having tolerated the procedure well.  COMPLICATIONS:  none  CONDITION: stable  Hortencia Pilar 11/08/2021 6:41 PM   This note was created with Dragon Medical transcription system. Any errors in dictation are purely unintentional.

## 2021-11-08 NOTE — Plan of Care (Signed)

## 2021-11-09 ENCOUNTER — Encounter: Payer: Self-pay | Admitting: Vascular Surgery

## 2021-11-09 DIAGNOSIS — I63239 Cerebral infarction due to unspecified occlusion or stenosis of unspecified carotid arteries: Secondary | ICD-10-CM

## 2021-11-09 LAB — BASIC METABOLIC PANEL
Anion gap: 8 (ref 5–15)
BUN: 16 mg/dL (ref 8–23)
CO2: 24 mmol/L (ref 22–32)
Calcium: 8.3 mg/dL — ABNORMAL LOW (ref 8.9–10.3)
Chloride: 106 mmol/L (ref 98–111)
Creatinine, Ser: 1.51 mg/dL — ABNORMAL HIGH (ref 0.44–1.00)
GFR, Estimated: 34 mL/min — ABNORMAL LOW (ref >60)
Glucose, Bld: 111 mg/dL — ABNORMAL HIGH (ref 70–99)
Potassium: 4.1 mmol/L (ref 3.5–5.1)
Sodium: 138 mmol/L (ref 135–145)

## 2021-11-09 LAB — CBC
HCT: 33.8 % — ABNORMAL LOW (ref 36.0–46.0)
Hemoglobin: 10.7 g/dL — ABNORMAL LOW (ref 12.0–15.0)
MCH: 29.5 pg (ref 26.0–34.0)
MCHC: 31.7 g/dL (ref 30.0–36.0)
MCV: 93.1 fL (ref 80.0–100.0)
Platelets: 265 10*3/uL (ref 150–400)
RBC: 3.63 MIL/uL — ABNORMAL LOW (ref 3.87–5.11)
RDW: 13.4 % (ref 11.5–15.5)
WBC: 11.3 10*3/uL — ABNORMAL HIGH (ref 4.0–10.5)
nRBC: 0 % (ref 0.0–0.2)

## 2021-11-09 NOTE — Plan of Care (Signed)

## 2021-11-09 NOTE — Discharge Instructions (Signed)
Vascular Surgery Discharge Instructions:  1) You may shower as of tomorrow.  Gently clean your groins with soap and water.  Gently pat dry. 2) Please do not engage in strenuous activity or lifting greater than 10 pounds until you are cleared at your first postoperative follow-up. 3) Please do not drive for at least 2 weeks.

## 2021-11-09 NOTE — Discharge Summary (Signed)
[facility]    Discharge Summary  Patient ID:  Sara Aguirre MRN: [id number] DOB/AGE: September 14, 1941 80 y.o.  Admit date: 11/08/2021 Discharge date: 11/09/2021 Date of Surgery: 11/08/2021 Surgeon: Surgeon(s): Schnier, Dolores Lory, MD  Admission Diagnosis: Carotid stenosis, symptomatic, with infarction [facility]) [I63.239]  Discharge Diagnoses:  Carotid stenosis, symptomatic, with infarction [facility]) [I63.239]  Secondary Diagnoses: Past Medical History:  Diagnosis Date   Cancer ([place])    bladder, s/p cystectomy with ileal conduit   History of bladder cancer    History of tobacco use    Hyperlipidemia    Hypertension    Incarcerated ventral hernia, s/p lap repair 12/05/2011   Parastomal hernia of ileal conduit, s/p lap repair [id number] 12/04/2011   Ulcer, gastric, acute or chronic    Procedure(s): 11/08/21:  Ultrasound guidance for vascular access right femoral artery  Placement of a 10 x 8 x 40 exact stent with the use of the NAV-6 embolic protection device in the right internal carotid artery  Discharged Condition: Good  HPI / Hospital Course:  Patient is an 80 year old female who presents with symptomatic critical right carotid artery stenosis.  The patient has had a recent CVA.  She has a very high bifurcation and significant comorbidities and carotid artery stenting was felt to be preferred to endarterectomy for that reason.  Risks and benefits were discussed and informed consent was obtained. On 11/08/21, the patient underwent:   Ultrasound guidance for vascular access right femoral artery  Placement of a 10 x 8 x 40 exact stent with the use of the NAV-6 embolic protection device in the right internal carotid artery  Physical Exam:  Alert and oriented x3, no acute distress Face: Symmetrical, tongue midline Neck: Trachea midline, no swelling Cardiovascular: Regular rate and rhythm Pulmonary: Clear to auscultation bilaterally Abdomen: Soft, nontender,  nondistended Access site:   Right groin: Access site clean dry intact.  No drainage or swelling. Extremities: Warm distally to toes Neuro: Intact, no deficits  Labs: As below  Complications: None  Consults: None  Significant Diagnostic Studies: CBC Lab Results  Component Value Date   WBC 11.3 (H) 11/09/2021   HGB 10.7 (L) 11/09/2021   HCT 33.8 (L) 11/09/2021   MCV 93.1 11/09/2021   PLT 265 11/09/2021   BMET    Component Value Date/Time   NA 138 11/09/2021 0218   NA 140 12/23/2020 1101   K 4.1 11/09/2021 0218   CL 106 11/09/2021 0218   CO2 24 11/09/2021 0218   GLUCOSE 111 (H) 11/09/2021 0218   BUN 16 11/09/2021 0218   BUN 18 12/23/2020 1101   CREATININE 1.51 (H) 11/09/2021 0218   CREATININE 1.46 (H) 09/29/2015 0854   CALCIUM 8.3 (L) 11/09/2021 0218   GFRNONAA 34 (L) 11/09/2021 0218   GFRNONAA 35 (L) 09/29/2015 0854   GFRAA 39 (L) 12/23/2020 1101   GFRAA 40 (L) 09/29/2015 0854   COAG Lab Results  Component Value Date   INR 1.1 10/08/2021   Disposition:  Discharge to :Home  Allergies as of 11/09/2021       Reactions   Imodium [loperamide]    Itchy all over   Other    Also allergic to a "cough medicine" but patient does not remember the name   Loperamide Hcl Rash        Medication List     TAKE these medications    amLODipine 10 MG tablet Commonly known as: NORVASC Take 1 tablet (10 mg total) by  mouth daily. For blood pressure   aspirin EC 81 MG tablet Take 81 mg by mouth daily. Swallow whole.   Blood Pressure Monitor Devi Use to check blood pressure 3 times a week   clopidogrel 75 MG tablet Commonly known as: PLAVIX Take 1 tablet (75 mg total) by mouth daily. For stroke   cyanocobalamin 1000 MCG tablet Take 1,000 mcg by mouth daily.   losartan 100 MG tablet Commonly known as: COZAAR Take 100 mg by mouth daily.   rosuvastatin 5 MG tablet Commonly known as: Crestor Take 1 tablet (5 mg total) by mouth daily. For cholesterol.    UNABLE TO FIND HOLISTER UROSTOMY POUCHES ITEM [id number]       Verbal and written Discharge instructions given to the patient. Wound care per Discharge AVS  Follow-up Information     Schnier, Dolores Lory, MD Follow up in 1 month(s).   Specialties: Vascular Surgery, Cardiology, Radiology, Vascular Surgery Why: Can see Schnier or Arna Medici.  Carotid stent.  Will need carotid duplex with visit. Contact information: [address] [place] [address] [phone]                Signed: Sela Hua, PA-C 11/09/2021, 11:57 AM

## 2021-11-09 NOTE — Progress Notes (Signed)
1610 patient discharge home with son all discharge paper and appts reviewed with son and patient sent card also given to patient son all belongings sent home with patient

## 2021-11-09 NOTE — Discharge Summary (Signed)
Physician Discharge Summary  Sara Aguirre MPN:[id number] DOB: 04-Jan-1941 DOA: 10/08/2021  PCP: Pleas Koch, NP  Admit date: 10/08/2021 Discharge date: 10/09/2021  Admitted From: home Disposition:  home  Recommendations for Outpatient Follow-up:  Follow up with PCP in 1-2 weeks Please obtain BMP/CBC in one week Please follow up with cardiology for loop recorder interrogation Follow up with neurology Follow up with vascular surgery  Home Health: none  Equipment/Devices: none   Discharge Condition: stable  CODE STATUS: full  Diet recommendation: Heart Healthy    Discharge Diagnoses: Principal Problem:   History of TIA (transient ischemic attack) Active Problems:   HTN (hypertension)   History of CVA (cerebrovascular accident)   CKD (chronic kidney disease), stage IIIb   COVID-19 virus infection    Summary of HPI and Hospital Course:  Per H&P by Dr. Blaine Hamper: "Sara Aguirre is a 80 y.o. female with medical history significant of stroke with mild balance issue, hypertension, hyperlipidemia, gastric ulcer, bladder cancer (s/p of cystectomy with ileal conduit), incarcerated ventral hernia repair, former smoker, s/p of  repair of parastomal hernia of ileal conduit, who present with left arm heaviness.   Patient states she had a 1 episode of left arm heaviness at about 1:15 PM.  Symptoms lasted for about 45 minutes then resolved spontaneously.  She states that she has mild left finger numbness from a previous stroke, which has not changed.  No slurred speech, facial droop.  No leg weakness or numbness. Patient does not have chest pain, cough, shortness breath, fever or chills.  No nausea vomiting, diarrhea or abdominal pain.  No symptoms of UTI.   ED Course: pt was found to have WBC 8.8, troponin level 6, pending COVID-19 PCR, slightly worsening renal function, temperature normal, blood pressure 189/69, heart rate 65, RR 18, oxygen saturation 95% on room air.  CT of head is  negative for acute intracranial abnormality. Consulted Dr. Curly Shores of neuro"    Left upper extremity weakness, transient - resolved. TIA (transient ischemic attack)  Hx of CVA (cerebrovascular accident):  CT-head negative.   Symptoms are consistent with TIA.  Neurology was consulted.   MRI brain and MRA head: 1. Restricted diffusion in the right parietal and occipital lobe cortex, with additional punctate areas of restricted diffusion throughout the right cerebral hemisphere, likely acute infarcts. Given multiple vascular territories, an embolic etiology is suspected. 2. Poor opacification of the proximal right P1 and distal right posterior communicating artery, with non visualization of the junction and proximal right P2, with only minimal opacification distally, likely severe stenosis. [Known fetal right PCA] 3. 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   Carotid ultrasound: 1. Right carotid artery system: Greater than 69% but less than near occlusion secondary to moderate multifocal atherosclerotic plaque formation, most prominent about the proximal ICA. 2. Left carotid artery system: Less than 50% stenosis secondary to mild multifocal atherosclerotic plaque formation. 3.  Vertebral artery system: Patent with antegrade flow bilaterally.   Echo: EF 00-86%, grade 1 diastolic dysfunction.  Neurology Recommendations as follows:  "- Continue home DAPT  - Risk factor modification - Telemetry monitoring; loop recorder interrogation  - Followed up PCP, outpatient cardiologist, and neurologist - Blood pressure goal - gradually normalize BP, start a single agent on discharge, with step wise reintroduction of home meds on PCP follow-up -Vascular surgery consult for symptomatic right internal carotid artery stenosis, appreciate consideration of urgent revascularization - Appreciate management of COVID19 positive result, elevated urinary light chains,  and other co-morbidities per primary team"  Vascular surgery recommended continuing DAPT and follow up for carotid artery stenting vs endarterectomy in 1-2 weeks.  PT recommended outpatient PT. OT recommended no follow up.  Patient symptoms resolved and she is clinically stable for d/c home with outpatient follow up to interrogate loop recorder.     HTN - covered with IV hydralazine for SBP>220 or dBP>120. Permissive HTN on admission due to concern for TIA/CVA.   CKD stage IIIb: Baseline creatinine 1.3-1.4.   Presented with creatinine is 1.54, BUN 18, close to baseline.   Hyperlipidemia -Crestor   COVID-19 infection: Patient is asymptomatic, no Covid-specific treatment needed.  Supportive care with PRN albuterol.   Discharge Instructions   Discharge Instructions     Call MD for:   Complete by: As directed    New or worsening neurology symptoms - weakness, numbness or tingling on one side of body, trouble speaking or swallowing, difficulty with walking or balance/coordination, difficulty understanding what others are saying to you, changes in your vision.  Heart palpitations or chest pain.   Call MD for:  extreme fatigue   Complete by: As directed    Call MD for:  persistant dizziness or light-headedness   Complete by: As directed    Call MD for:  persistant nausea and vomiting   Complete by: As directed    Call MD for:  severe uncontrolled pain   Complete by: As directed    Call MD for:  temperature >100.4   Complete by: As directed    Diet - low sodium heart healthy   Complete by: As directed    Discharge instructions   Complete by: As directed    Continue taking your aspirin and Plavix (clopidogrel).  You have narrowing of your Right carotid artery in the neck.  Vascular surgery recommends a procedure to open up this narrowing in 1-2 weeks and will follow up with you.  Please contact your Loop Recorder device manufacturer and/or the doctor's office that has this set  up for you. The loop recorder should be interrogated tomorrow to see if you had any episodes of A-fib.  Regarding your BP medications - please follow what Dr. Curly Shores instructs for your amlodipine and losartan. Sometimes we hold these for up to 2 days after a stroke, and gradually bring the BP back down.  Follow up with Neurology and/or PCP about your BP medications.   Increase activity slowly   Complete by: As directed       Allergies as of 10/09/2021       Reactions   Imodium [loperamide]    Itchy all over   Loperamide Hcl Rash        Medication List     STOP taking these medications    lisinopril-hydrochlorothiazide 20-25 MG tablet Commonly known as: ZESTORETIC       TAKE these medications    aspirin EC 81 MG tablet Take 81 mg by mouth daily. Swallow whole.   Blood Pressure Monitor Devi Use to check blood pressure 3 times a week   clopidogrel 75 MG tablet Commonly known as: PLAVIX Take 1 tablet (75 mg total) by mouth daily. For stroke   cyanocobalamin 1000 MCG tablet Take 1,000 mcg by mouth daily.   losartan 100 MG tablet Commonly known as: COZAAR Take 100 mg by mouth daily.   rosuvastatin 5 MG tablet Commonly known as: Crestor Take 1 tablet (5 mg total) by mouth daily. For cholesterol.   UNABLE TO FIND  HOLISTER UROSTOMY POUCHES ITEM [id number]        Allergies  Allergen Reactions   Imodium [Loperamide]     Itchy all over   Other     Also allergic to a "cough medicine" but patient does not remember the name   Loperamide Hcl Rash     If you experience worsening of your admission symptoms, develop shortness of breath, life threatening emergency, suicidal or homicidal thoughts you must seek medical attention immediately by calling 911 or calling your MD immediately  if symptoms less severe.    Please note   You were cared for by a hospitalist during your hospital stay. If you have any questions about your discharge medications or the care you  received while you were in the hospital after you are discharged, you can call the unit and asked to speak with the hospitalist on call if the hospitalist that took care of you is not available. Once you are discharged, your primary care physician will handle any further medical issues. Please note that NO REFILLS for any discharge medications will be authorized once you are discharged, as it is imperative that you return to your primary care physician (or establish a relationship with a primary care physician if you do not have one) for your aftercare needs so that they can reassess your need for medications and monitor your lab values.   Consultations: Neurology Vascular surgery    Procedures/Studies: PERIPHERAL VASCULAR CATHETERIZATION  Result Date: 11/08/2021 See surgical note for result.     Subjective: Pt sitting up in bed awake when seen.  Reports she feels well this AM.  No new, worsening or recurrent neurologic symptoms.  No chest pain, dizziness, palpitations or other complaints.     Discharge Exam: Vitals:   10/09/21 0717 10/09/21 1134  BP: (!) 166/56 (!) 153/59  Pulse: (!) 57 61  Resp: 16 16  Temp: 98.3 F (36.8 C) 98.6 F (37 C)  SpO2: 95% 96%   Vitals:   10/08/21 1951 10/09/21 0327 10/09/21 0717 10/09/21 1134  BP: (!) 162/85 (!) 159/66 (!) 166/56 (!) 153/59  Pulse: 93 65 (!) 57 61  Resp: 18  16 16   Temp: 97.9 F (36.6 C) (!) 97.4 F (36.3 C) 98.3 F (36.8 C) 98.6 F (37 C)  TempSrc: Oral Oral    SpO2: 99% 98% 95% 96%  Weight:      Height:        General: Pt is alert, awake, not in acute distress Cardiovascular: RRR, S1/S2 +, no rubs, no gallops Respiratory: CTA bilaterally, no wheezing, no rhonchi Abdominal: Soft, NT, ND, bowel sounds + Extremities: no edema, no cyanosis    The results of significant diagnostics from this hospitalization (including imaging, microbiology, ancillary and laboratory) are listed below for reference.      Microbiology: Recent Results (from the past 240 hour(s))  MRSA Next Gen by PCR, Nasal     Status: None   Collection Time: 11/08/21  4:42 PM   Specimen: Nasal Mucosa; Nasal Swab  Result Value Ref Range Status   MRSA by PCR Next Gen NOT DETECTED NOT DETECTED Final    Comment: (NOTE) The GeneXpert MRSA Assay (FDA approved for NASAL specimens only), is one component of a comprehensive MRSA colonization surveillance program. It is not intended to diagnose MRSA infection nor to guide or monitor treatment for MRSA infections. Test performance is not FDA approved in patients less than 46 years old. Performed at [facility], [address] [place], [place] [address]      Labs: BNP (last 3 results) No results for input(s): BNP in the last 8760 hours. Basic Metabolic Panel: Recent Labs  Lab 11/08/21 1131 11/09/21 0218  NA  --  138  K  --  4.1  CL  --  106  CO2  --  24  GLUCOSE  --  111*  BUN 20 16  CREATININE 1.29* 1.51*  CALCIUM  --  8.3*   Liver Function Tests: No results for input(s): AST, ALT, ALKPHOS, BILITOT, PROT, ALBUMIN in the last 168 hours. No results for input(s): LIPASE, AMYLASE in the last 168 hours. No results for input(s): AMMONIA in the last 168 hours. CBC: Recent Labs  Lab 11/09/21 0218  WBC 11.3*  HGB 10.7*  HCT 33.8*  MCV 93.1  PLT 265   Cardiac Enzymes: No results for input(s): CKTOTAL, CKMB, CKMBINDEX, TROPONINI in the last 168 hours. BNP: Invalid input(s): POCBNP CBG: Recent Labs  Lab 11/08/21 1641 11/08/21 1802 11/08/21 2307  GLUCAP 61* 151* 119*   D-Dimer No results for input(s): DDIMER in the last 72 hours. Hgb A1c No results for input(s): HGBA1C in the last 72 hours. Lipid Profile No results for input(s): CHOL, HDL, LDLCALC, TRIG, CHOLHDL, LDLDIRECT in the last 72 hours. Thyroid function studies No results for input(s): TSH, T4TOTAL, T3FREE, THYROIDAB in the last 72 hours.  Invalid input(s): FREET3 Anemia work  up No results for input(s): VITAMINB12, FOLATE, FERRITIN, TIBC, IRON, RETICCTPCT in the last 72 hours. Urinalysis    Component Value Date/Time   COLORURINE YELLOW 04/21/2020 1916   APPEARANCEUR CLOUDY (A) 04/21/2020 1916   LABSPEC 1.016 04/21/2020 1916   PHURINE 7.0 04/21/2020 1916   GLUCOSEU NEGATIVE 04/21/2020 1916   HGBUR NEGATIVE 04/21/2020 1916   BILIRUBINUR NEGATIVE 04/21/2020 1916   KETONESUR NEGATIVE 04/21/2020 1916   PROTEINUR 100 (A) 04/21/2020 1916   UROBILINOGEN 0.2 12/13/2012 2155   NITRITE NEGATIVE 04/21/2020 1916   LEUKOCYTESUR LARGE (A) 04/21/2020 1916   Sepsis Labs Invalid input(s): PROCALCITONIN,  WBC,  LACTICIDVEN Microbiology Recent Results (from the past 240 hour(s))  MRSA Next Gen by PCR, Nasal     Status: None   Collection Time: 11/08/21  4:42 PM   Specimen: Nasal Mucosa; Nasal Swab  Result Value Ref Range Status   MRSA by PCR Next Gen NOT DETECTED NOT DETECTED Final    Comment: (NOTE) The GeneXpert MRSA Assay (FDA approved for NASAL specimens only), is one component of a comprehensive MRSA colonization surveillance program. It is not intended to diagnose MRSA infection nor to guide or monitor treatment for MRSA infections. Test performance is not FDA approved in patients less than 38 years old. Performed at [facility], [address] [place], [place] [address]      Time coordinating discharge: Over 30 minutes  SIGNED:   Ezekiel Slocumb, DO [facility] 11/09/2021, 7:45 AM   If 7PM-7AM, please contact night-coverage [email or web address]

## 2021-11-09 NOTE — Progress Notes (Addendum)
1140 removed 20 cc air drom PAD no complications noted 9539 removed remaining air no complications noted

## 2021-11-11 LAB — POCT ACTIVATED CLOTTING TIME: Activated Clotting Time: 287 seconds

## 2021-11-24 ENCOUNTER — Telehealth: Payer: Self-pay | Admitting: Internal Medicine

## 2021-11-24 ENCOUNTER — Telehealth: Payer: Self-pay

## 2021-11-24 NOTE — Telephone Encounter (Signed)
Patient son called in stating he wants [place] to cancel upcoming transmissions for patient since they have sent the monitor back and she doesn't want to do remote transmissions anymore and wants it removed eventually. I let son know I will cancel appointments and let the doctor know

## 2021-11-24 NOTE — Telephone Encounter (Signed)
Patient's son Elta Gu[place]oupe called stating patient would like to have her pace maker removed.

## 2021-11-24 NOTE — Telephone Encounter (Signed)
Left message with patient. Asked her to relay to son that RN will call to discuss when she returns to the office. Pt agreeable to plan.

## 2021-11-25 NOTE — Telephone Encounter (Signed)
Spoke with pt's son, DPR who states pt wold like to have ILR removed.  Pt's son reports pt has returned monitor and stopped transmitting a few months ago.  Pt has since had another stroke and found to have a 90% blockage in her Carotid Artery with stent placement.   Pt's son advised will forward to Dr Caryl Comes for review and can then schedule removal of Linq.  Pt's son verbalizes understanding and agrees with current plan.

## 2021-12-06 ENCOUNTER — Other Ambulatory Visit (INDEPENDENT_AMBULATORY_CARE_PROVIDER_SITE_OTHER): Payer: Self-pay | Admitting: Vascular Surgery

## 2021-12-06 DIAGNOSIS — I6521 Occlusion and stenosis of right carotid artery: Secondary | ICD-10-CM

## 2021-12-06 DIAGNOSIS — Z95828 Presence of other vascular implants and grafts: Secondary | ICD-10-CM

## 2021-12-06 NOTE — H&P (Signed)
Subjective:      Subjective   Patient ID: Sara Aguirre, female    DOB: Sep 08, 1941, 80 y.o.   MRN: [id number]     Chief Complaint  Patient presents with   New Patient (Initial Visit)      NP consult 80 year old female recent hospitalization for TIA. Had >69% right craniostenosis and <50%  Lt craniostenosis. Vascular services recommend  surgical treatment  referred by  Alma Friendly      Sara Aguirre is an 80 year old female that presents today after recent TIA.  The patient had numbness and weakness of her left side but it resolved shortly after arriving at the hospital.  The symptoms subsided within a few minutes.  A carotid duplex done at [facility] noted a greater than 69% stenosis of the right ICA however not advanced enough to be a string sign.  It is also noted that the left side has less than 50% stenosis.  Since that time the patient notes that she has little numbness in her fingertips but otherwise fairly back to normal.     Review of Systems  Neurological:  Positive for weakness and numbness.  All other systems reviewed and are negative.       Objective:    Objective   Physical Exam Vitals reviewed.  HENT:     Head: Normocephalic.  Cardiovascular:     Rate and Rhythm: Normal rate.  Pulmonary:     Effort: Pulmonary effort is normal.  Neurological:     Mental Status: She is alert and oriented to person, place, and time.  Psychiatric:        Mood and Affect: Mood normal.        Behavior: Behavior normal.        Thought Content: Thought content normal.        Judgment: Judgment normal.      BP (!) 160/71    Pulse 90    Ht 5\' 8"  (1.727 m)    Wt 138 lb (62.6 kg)    BMI 20.98 kg/m        Past Medical History:  Diagnosis Date   Cancer ([place])      bladder, s/p cystectomy with ileal conduit   History of bladder cancer     History of tobacco use     Hyperlipidemia     Hypertension     Incarcerated ventral hernia, s/p lap repair 12/05/2011    Parastomal hernia of ileal conduit, s/p lap repair [id number] 12/04/2011   Ulcer, gastric, acute or chronic        Social History         Socioeconomic History   Marital status: Widowed      Spouse name: Not on file   Number of children: 2   Years of education: Not on file   Highest education level: Some college, no degree  Occupational History   Occupation: retired  Tobacco Use   Smoking status: Former      Types: Cigarettes      Quit date: 01/02/1993      Years since quitting: 28.8   Smokeless tobacco: Never  Vaping Use   Vaping Use: Never used  Substance and Sexual Activity   Alcohol use: No   Drug use: No   Sexual activity: Not on file  Other Topics Concern   Not on file  Social History Narrative    Marital status: widowed since 1992; not dating; not interested  Children:  Twin boys (54); 2 grandchildren       Lives: alone with dog, 5 chickens, 2 rabbits       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  Gardening       ADLs: drives; independent with ADLs; has garden       Advanced Directives: YES: full code but no prolonged measures; HCPOA: Orvis Brill or other son Cory Munch; copy on chart 04/2017.    Social Determinants of Health    Financial Resource Strain: Not on file  Food Insecurity: Not on file  Transportation Needs: Not on file  Physical Activity: Not on file  Stress: Not on file  Social Connections: Not on file  Intimate Partner Violence: Not on file           Past Surgical History:  Procedure Laterality Date   BLADDER REMOVAL   10/1993    Dr. Risa Grill   HERNIA REPAIR   12/05/11    parastomal   LOOP RECORDER INSERTION N/A 04/23/2020    Procedure: LOOP RECORDER INSERTION;  Surgeon: Deboraha Sprang, MD;  Location: [facility];  Service: Cardiovascular;  Laterality: N/A;   PARASTOMAL  HERNIA REPAIR   12/05/2011    Procedure: HERNIA REPAIR PARASTOMAL;  Surgeon: Adin Hector, MD;  Location: [facility];  Service: General;;  Laprascopic lysis of adhestons with reduction and repair with biological mesh for incarcerated parastomal and ventral long incisional hernia.   REVISION UROSTOMY CUTANEOUS       VENTRAL HERNIA REPAIR   12/05/2011    Procedure: HERNIA REPAIR VENTRAL ADULT;  Surgeon: Adin Hector, MD;  Location: [facility];  Service: General;;           Family History  Problem Relation Age of Onset   Heart disease Mother 14        CHF   Alcohol abuse Father     Stroke Father             Allergies  Allergen Reactions   Imodium [Loperamide]        Itchy all over   Loperamide Hcl Rash      CBC Latest Ref Rng & Units 10/08/2021 08/11/2021 11/04/2020  WBC 4.0 - 10.5 K/uL 8.8 9.0 8.2  Hemoglobin 12.0 - 15.0 g/dL 13.7 13.3 13.8  Hematocrit 36.0 - 46.0 % 41.4 40.5 42.8  Platelets 150 - 400 K/uL 264 267.0 313          CMP     Labs (Brief)          Component Value Date/Time    NA 139 10/08/2021 1442    NA 140 12/23/2020 1101    K 4.2 10/08/2021 1442    CL 101 10/08/2021 1442    CO2 29 10/08/2021 1442    GLUCOSE 138 (H) 10/08/2021 1442    BUN 18 10/08/2021 1442    BUN 18 12/23/2020 1101    CREATININE 1.54 (H) 10/08/2021 1442    CREATININE 1.46 (H) 09/29/2015 0854    CALCIUM 9.7 10/08/2021 1442    PROT 7.4 10/08/2021 1442    PROT 6.9 12/23/2020 1101    ALBUMIN 3.8 10/08/2021 1442    ALBUMIN 3.9 12/23/2020 1101    AST 20 10/08/2021 1442    ALT 10  10/08/2021 1442    ALKPHOS 59 10/08/2021 1442    BILITOT 0.7 10/08/2021 1442    BILITOT 0.4 12/23/2020 1101    GFRNONAA 34 (L) 10/08/2021 1442    GFRNONAA 35 (L) 09/29/2015 0854    GFRAA 39 (L) 12/23/2020 1101    GFRAA 40 (L) 09/29/2015 0854          No results found.       Assessment & Plan:    1. Bilateral carotid artery stenosis NRecommend:  The patient is symptomatic with respect to the carotid  stenosis.  The patient now has progressed and has a lesion the is >70%.  Patient's CT angiography of the carotid arteries confirms >70% right ICA stenosis.  The anatomical considerations support stenting over surgery.  This was discussed in detail with the patient.  The risks, benefits and alternative therapies were reviewed in detail with the patient.  All questions were answered.  The patient agrees to proceed with stenting of the right carotid artery.  Continue antiplatelet therapy as prescribed. Continue management of CAD, HTN and Hyperlipidemia. Healthy heart diet, encouraged exercise at least 4 times per week.     2. Primary hypertension Continue antihypertensive medications as already ordered, these medications have been reviewed and there are no changes at this time. Continue antihypertensive medications as already ordered, these medications have been reviewed and there are no changes at this time.    3. Renal insufficiency The patient has an upcoming appointment with her nephrologist Dr. Juleen China.  She would like to discuss the options with him.  We discussed a CT angiogram which would require more dye but be less invasive for the patient as well as take less time to perform.  A carotid angiogram however will use less dye but, it is more invasive.  Patient and family are advised to discuss with Dr. Juleen China and contact our office to let us know which option they prefer to proceed with.           Current Outpatient Medications on File Prior to Visit  Medication Sig Dispense Refill   amLODipine (NORVASC) 5 MG tablet Take 1 tablet (5 mg total) by mouth daily. For blood pressure. 90 tablet 2   aspirin EC 81 MG tablet Take 81 mg by mouth daily. Swallow whole.       Blood Pressure Monitor DEVI Use to check blood pressure 3 times a week 1 Device 0   clopidogrel (PLAVIX) 75 MG tablet Take 1 tablet (75 mg total) by mouth daily. For stroke 90 tablet 3   cyanocobalamin 1000 MCG tablet Take 1,000  mcg by mouth daily.       losartan (COZAAR) 100 MG tablet Take 100 mg by mouth daily.       rosuvastatin (CRESTOR) 5 MG tablet Take 1 tablet (5 mg total) by mouth daily. For cholesterol. 90 tablet 3   UNABLE TO FIND HOLISTER UROSTOMY POUCHES ITEM 8478 100 Device 3    No current facility-administered medications on file prior to visit.      There are no Patient Instructions on file for this visit. No follow-ups on file.    Reviewed and confirmed Katha Cabal, MD                   Detailed Report  Note shared with patient Additional Documentation  Vitals:  BP 160/71 Important   Pulse 90 Ht 5\' 8"  (1.727 m) Wt 62.6 kg BMI 20.98 kg/m BSA 1.73 m

## 2021-12-08 ENCOUNTER — Ambulatory Visit (INDEPENDENT_AMBULATORY_CARE_PROVIDER_SITE_OTHER): Payer: PPO

## 2021-12-08 ENCOUNTER — Ambulatory Visit (INDEPENDENT_AMBULATORY_CARE_PROVIDER_SITE_OTHER): Payer: PPO | Admitting: Nurse Practitioner

## 2021-12-08 ENCOUNTER — Other Ambulatory Visit: Payer: Self-pay

## 2021-12-08 VITALS — BP 181/67 | HR 72 | Ht 68.0 in | Wt 142.0 lb

## 2021-12-08 DIAGNOSIS — I6521 Occlusion and stenosis of right carotid artery: Secondary | ICD-10-CM

## 2021-12-08 DIAGNOSIS — Z8673 Personal history of transient ischemic attack (TIA), and cerebral infarction without residual deficits: Secondary | ICD-10-CM

## 2021-12-09 NOTE — Progress Notes (Signed)
Subjective:    Patient ID: Sara Aguirre, female    DOB: 1941/08/15, 80 y.o.   MRN: [id number] Chief Complaint  Patient presents with   Follow-up    1 mo 12/09/21 carotid stent will need cartid duplix with duplix     Sara Aguirre is an 80 year old female that he patient is seen for follow up evaluation of carotid stenosis status post right carotid stent placement on 11/08/2021.  There were no post operative problems or complications related to the procedure.  The patient denies neck or incisional pain.  The patient denies interval amaurosis fugax. There is a recent history of TIA symptoms or focal motor deficits. There is no prior documented CVA.  The patient denies headache.  The patient is taking enteric-coated aspirin 81 mg daily.  The patient has a history of coronary artery disease, no recent episodes of angina or shortness of breath. The patient denies PAD or claudication symptoms. There is a history of hyperlipidemia which is being treated with a statin.    Today noninvasive studies show 1 to 39% stenosis in the bilateral ICAs.  The right ICA has a open and patent stent.  The bilateral vertebral arteries have antegrade flow with normal flow hemodynamics in the bilateral subclavian arteries.   Review of Systems  Neurological:  Positive for numbness. Negative for dizziness.  All other systems reviewed and are negative.     Objective:   Physical Exam Vitals reviewed.  HENT:     Head: Normocephalic.  Neck:     Vascular: No carotid bruit.  Cardiovascular:     Rate and Rhythm: Normal rate and regular rhythm.     Pulses: Normal pulses.     Heart sounds: Normal heart sounds.  Pulmonary:     Effort: Pulmonary effort is normal.  Skin:    General: Skin is warm and dry.  Neurological:     Mental Status: She is alert and oriented to person, place, and time.  Psychiatric:        Mood and Affect: Mood normal.        Behavior: Behavior normal.        Thought Content:  Thought content normal.        Judgment: Judgment normal.    BP (!) 181/67    Pulse 72    Ht 5\' 8"  (1.727 m)    Wt 142 lb (64.4 kg)    BMI 21.59 kg/m   Past Medical History:  Diagnosis Date   Cancer ([place])    bladder, s/p cystectomy with ileal conduit   History of bladder cancer    History of tobacco use    Hyperlipidemia    Hypertension    Incarcerated ventral hernia, s/p lap repair 12/05/2011   Parastomal hernia of ileal conduit, s/p lap repair [id number] 12/04/2011   Ulcer, gastric, acute or chronic     Social History   Socioeconomic History   Marital status: Widowed    Spouse name: Not on file   Number of children: 2   Years of education: Not on file   Highest education level: Some college, no degree  Occupational History   Occupation: retired  Tobacco Use   Smoking status: Former    Types: Cigarettes    Quit date: 01/02/1993    Years since quitting: 28.9   Smokeless tobacco: Never  Vaping Use   Vaping Use: Never used  Substance and Sexual Activity   Alcohol use: No   Drug use: No   Sexual  activity: Not on file  Other Topics Concern   Not on file  Social History Narrative   Marital status: widowed since 1992; not dating; not interested      Children:  Twin boys (83); 2 grandchildren      Lives: alone with dog, 5 chickens, 2 rabbits      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  Gardening      ADLs: drives; independent with ADLs; has garden      Advanced Directives: YES: full code but no prolonged measures; HCPOA: Sara Aguirre or other son Sara Aguirre; copy on chart 04/2017.   Social Determinants of Health   Financial Resource Strain: Not on file  Food Insecurity: Not on file  Transportation Needs: Not on file  Physical Activity: Not on file  Stress: Not on file  Social Connections: Not on file  Intimate  Partner Violence: Not on file    Past Surgical History:  Procedure Laterality Date   BLADDER REMOVAL  10/1993   Dr. Risa Grill   CAROTID PTA/STENT INTERVENTION Right 11/08/2021   Procedure: CAROTID PTA/STENT INTERVENTION;  Surgeon: Katha Cabal, MD;  Location: [facility];  Service: Cardiovascular;  Laterality: Right;   HERNIA REPAIR  12/05/11   parastomal   LOOP RECORDER INSERTION N/A 04/23/2020   Procedure: LOOP RECORDER INSERTION;  Surgeon: Deboraha Sprang, MD;  Location: [facility];  Service: Cardiovascular;  Laterality: N/A;   PARASTOMAL HERNIA REPAIR  12/05/2011   Procedure: HERNIA REPAIR PARASTOMAL;  Surgeon: Adin Hector, MD;  Location: [facility];  Service: General;;  Laprascopic lysis of adhestons with reduction and repair with biological mesh for incarcerated parastomal and ventral long incisional hernia.   REVISION UROSTOMY CUTANEOUS     VENTRAL HERNIA REPAIR  12/05/2011   Procedure: HERNIA REPAIR VENTRAL ADULT;  Surgeon: Adin Hector, MD;  Location: [facility];  Service: General;;    Family History  Problem Relation Age of Onset   Heart disease Mother 25       CHF   Alcohol abuse Father    Stroke Father     Allergies  Allergen Reactions   Imodium [Loperamide]     Itchy all over   Other     Also allergic to a "cough medicine" but patient does not remember the name   Loperamide Hcl Rash    CBC Latest Ref Rng & Units 11/09/2021 10/08/2021 08/11/2021  WBC 4.0 - 10.5 K/uL 11.3(H) 8.8 9.0  Hemoglobin 12.0 - 15.0 g/dL 10.7(L) 13.7 13.3  Hematocrit 36.0 - 46.0 % 33.8(L) 41.4 40.5  Platelets 150 - 400 K/uL 265 264 267.0      CMP     Component Value Date/Time   NA 138 11/09/2021 0218   NA 140 12/23/2020 1101   K 4.1 11/09/2021 0218   CL 106 11/09/2021 0218   CO2 24 11/09/2021 0218   GLUCOSE 111 (H) 11/09/2021 0218   BUN 16 11/09/2021 0218   BUN 18 12/23/2020 1101   CREATININE 1.51 (H) 11/09/2021 0218   CREATININE 1.46 (H) 09/29/2015 0854    CALCIUM 8.3 (L) 11/09/2021 0218   PROT 7.4 10/08/2021 1442   PROT 6.9 12/23/2020 1101   ALBUMIN 3.8 10/08/2021 1442   ALBUMIN 3.9 12/23/2020 1101   AST 20 10/08/2021  1442   ALT 10 10/08/2021 1442   ALKPHOS 59 10/08/2021 1442   BILITOT 0.7 10/08/2021 1442   BILITOT 0.4 12/23/2020 1101   GFRNONAA 34 (L) 11/09/2021 0218   GFRNONAA 35 (L) 09/29/2015 0854   GFRAA 39 (L) 12/23/2020 1101   GFRAA 40 (L) 09/29/2015 0854     No results found.     Assessment & Plan:   1. Carotid stenosis, right Recommend:  The patient is s/p successful right carotid stenosis  Duplex ultrasound preoperatively shows 1-39% contralateral stenosis.  Continue antiplatelet therapy as prescribed Continue management of CAD, HTN and Hyperlipidemia Healthy heart diet,  encouraged exercise at least 4 times per week  Follow up in 3 months with duplex ultrasound and physical exam  - VAS US CAROTID   2. History of CVA (cerebrovascular accident) Minimal residual deficits limited to some numbness in fingertips.  Patient will continue to follow-up with PCP and neurology.   Current Outpatient Medications on File Prior to Visit  Medication Sig Dispense Refill   amLODipine (NORVASC) 10 MG tablet Take 1 tablet (10 mg total) by mouth daily. For blood pressure 90 tablet 3   aspirin EC 81 MG tablet Take 81 mg by mouth daily. Swallow whole.     Blood Pressure Monitor DEVI Use to check blood pressure 3 times a week 1 Device 0   clopidogrel (PLAVIX) 75 MG tablet Take 1 tablet (75 mg total) by mouth daily. For stroke 90 tablet 3   cyanocobalamin 1000 MCG tablet Take 1,000 mcg by mouth daily.     losartan (COZAAR) 100 MG tablet Take 100 mg by mouth daily.     rosuvastatin (CRESTOR) 5 MG tablet Take 1 tablet (5 mg total) by mouth daily. For cholesterol. 90 tablet 3   UNABLE TO FIND HOLISTER UROSTOMY POUCHES ITEM 8478 100 Device 3   No current facility-administered medications on file prior to visit.    There are no  Patient Instructions on file for this visit. No follow-ups on file.   Kris Hartmann, NP

## 2021-12-10 ENCOUNTER — Encounter (INDEPENDENT_AMBULATORY_CARE_PROVIDER_SITE_OTHER): Payer: Self-pay | Admitting: Nurse Practitioner

## 2021-12-16 NOTE — Telephone Encounter (Signed)
Error

## 2022-02-06 DIAGNOSIS — I129 Hypertensive chronic kidney disease with stage 1 through stage 4 chronic kidney disease, or unspecified chronic kidney disease: Secondary | ICD-10-CM | POA: Diagnosis not present

## 2022-02-06 DIAGNOSIS — R809 Proteinuria, unspecified: Secondary | ICD-10-CM | POA: Diagnosis not present

## 2022-02-06 DIAGNOSIS — N184 Chronic kidney disease, stage 4 (severe): Secondary | ICD-10-CM | POA: Diagnosis not present

## 2022-02-06 DIAGNOSIS — R319 Hematuria, unspecified: Secondary | ICD-10-CM | POA: Diagnosis not present

## 2022-02-09 DIAGNOSIS — I129 Hypertensive chronic kidney disease with stage 1 through stage 4 chronic kidney disease, or unspecified chronic kidney disease: Secondary | ICD-10-CM | POA: Diagnosis not present

## 2022-02-09 DIAGNOSIS — R319 Hematuria, unspecified: Secondary | ICD-10-CM | POA: Diagnosis not present

## 2022-02-09 DIAGNOSIS — R809 Proteinuria, unspecified: Secondary | ICD-10-CM | POA: Diagnosis not present

## 2022-02-09 DIAGNOSIS — N1832 Chronic kidney disease, stage 3b: Secondary | ICD-10-CM | POA: Diagnosis not present

## 2022-02-13 ENCOUNTER — Other Ambulatory Visit: Payer: PPO

## 2022-02-23 ENCOUNTER — Other Ambulatory Visit (INDEPENDENT_AMBULATORY_CARE_PROVIDER_SITE_OTHER): Payer: PPO

## 2022-02-23 ENCOUNTER — Other Ambulatory Visit: Payer: Self-pay

## 2022-02-23 DIAGNOSIS — E7800 Pure hypercholesterolemia, unspecified: Secondary | ICD-10-CM | POA: Diagnosis not present

## 2022-02-23 LAB — LIPID PANEL
Cholesterol: 124 mg/dL (ref 0–200)
HDL: 59.9 mg/dL (ref >39.00)
LDL Cholesterol: 40 mg/dL (ref 0–99)
NonHDL: 63.71
Total CHOL/HDL Ratio: 2
Triglycerides: 121 mg/dL (ref 0.0–149.0)
VLDL: 24.2 mg/dL (ref 0.0–40.0)

## 2022-03-22 ENCOUNTER — Ambulatory Visit (INDEPENDENT_AMBULATORY_CARE_PROVIDER_SITE_OTHER): Payer: PPO

## 2022-03-22 VITALS — Ht 68.0 in | Wt 139.4 lb

## 2022-03-22 DIAGNOSIS — Z0000 Encounter for general adult medical examination without abnormal findings: Secondary | ICD-10-CM | POA: Diagnosis not present

## 2022-03-22 NOTE — Patient Instructions (Signed)
Ms. Vankirk , ?Thank you for taking time to come for your Medicare Wellness Visit. I appreciate your ongoing commitment to your health goals. Please review the following plan we discussed and let me know if I can assist you in the future.  ? ?Screening recommendations/referrals: ?Colonoscopy: No longer required due to age.  ?Mammogram: No longer required due to age.  ?Bone Density: Declined. ? ?Recommended yearly ophthalmology/optometry visit for glaucoma screening and checkup ?Recommended yearly dental visit for hygiene and checkup ? ?Vaccinations: ?Influenza vaccine: Done 08/22/2020 Repeat annually ? ?Pneumococcal vaccine: Done 08/08/2019, 01/17/2017 and 04/19/2018 ?Tdap vaccine: Due. Repeat in 10 years ? ?Shingles vaccine: Done 11/27/2019 and 09/21/2019.   ?Covid-19:Done 03/19/2020, 02/27/2020. ? ?Advanced directives: Copies scanned into chart. ? ?Conditions/risks identified: KEEP UP THE GOOD WORK!! ? ?Next appointment: Follow up in one year for your annual wellness visit 2024. ? ? ?Preventive Care 40 Years and Older, Female ?Preventive care refers to lifestyle choices and visits with your health care provider that can promote health and wellness. ?What does preventive care include? ?A yearly physical exam. This is also called an annual well check. ?Dental exams once or twice a year. ?Routine eye exams. Ask your health care provider how often you should have your eyes checked. ?Personal lifestyle choices, including: ?Daily care of your teeth and gums. ?Regular physical activity. ?Eating a healthy diet. ?Avoiding tobacco and drug use. ?Limiting alcohol use. ?Practicing safe sex. ?Taking low-dose aspirin every day. ?Taking vitamin and mineral supplements as recommended by your health care provider. ?What happens during an annual well check? ?The services and screenings done by your health care provider during your annual well check will depend on your age, overall health, lifestyle risk factors, and family history of  disease. ?Counseling  ?Your health care provider may ask you questions about your: ?Alcohol use. ?Tobacco use. ?Drug use. ?Emotional well-being. ?Home and relationship well-being. ?Sexual activity. ?Eating habits. ?History of falls. ?Memory and ability to understand (cognition). ?Work and work Statistician. ?Reproductive health. ?Screening  ?You may have the following tests or measurements: ?Height, weight, and BMI. ?Blood pressure. ?Lipid and cholesterol levels. These may be checked every 5 years, or more frequently if you are over 15 years old. ?Skin check. ?Lung cancer screening. You may have this screening every year starting at age 16 if you have a 30-pack-year history of smoking and currently smoke or have quit within the past 15 years. ?Fecal occult blood test (FOBT) of the stool. You may have this test every year starting at age 31. ?Flexible sigmoidoscopy or colonoscopy. You may have a sigmoidoscopy every 5 years or a colonoscopy every 10 years starting at age 12. ?Hepatitis C blood test. ?Hepatitis B blood test. ?Sexually transmitted disease (STD) testing. ?Diabetes screening. This is done by checking your blood sugar (glucose) after you have not eaten for a while (fasting). You may have this done every 1-3 years. ?Bone density scan. This is done to screen for osteoporosis. You may have this done starting at age 24. ?Mammogram. This may be done every 1-2 years. Talk to your health care provider about how often you should have regular mammograms. ?Talk with your health care provider about your test results, treatment options, and if necessary, the need for more tests. ?Vaccines  ?Your health care provider may recommend certain vaccines, such as: ?Influenza vaccine. This is recommended every year. ?Tetanus, diphtheria, and acellular pertussis (Tdap, Td) vaccine. You may need a Td booster every 10 years. ?Zoster vaccine. You may need this  after age 55. ?Pneumococcal 13-valent conjugate (PCV13) vaccine. One  dose is recommended after age 37. ?Pneumococcal polysaccharide (PPSV23) vaccine. One dose is recommended after age 22. ?Talk to your health care provider about which screenings and vaccines you need and how often you need them. ?This information is not intended to replace advice given to you by your health care provider. Make sure you discuss any questions you have with your health care provider. ?Document Released: 12/31/2015 Document Revised: 08/23/2016 Document Reviewed: 10/05/2015 ?Elsevier Interactive Patient Education ? 2017 [place]. ? ?Fall Prevention in the Home ?Falls can cause injuries. They can happen to people of all ages. There are many things you can do to make your home safe and to help prevent falls. ?What can I do on the outside of my home? ?Regularly fix the edges of walkways and driveways and fix any cracks. ?Remove anything that might make you trip as you walk through a door, such as a raised step or threshold. ?Trim any bushes or trees on the path to your home. ?Use bright outdoor lighting. ?Clear any walking paths of anything that might make someone trip, such as rocks or tools. ?Regularly check to see if handrails are loose or broken. Make sure that both sides of any steps have handrails. ?Any raised decks and porches should have guardrails on the edges. ?Have any leaves, snow, or ice cleared regularly. ?Use sand or salt on walking paths during winter. ?Clean up any spills in your garage right away. This includes oil or grease spills. ?What can I do in the bathroom? ?Use night lights. ?Install grab bars by the toilet and in the tub and shower. Do not use towel bars as grab bars. ?Use non-skid mats or decals in the tub or shower. ?If you need to sit down in the shower, use a plastic, non-slip stool. ?Keep the floor dry. Clean up any water that spills on the floor as soon as it happens. ?Remove soap buildup in the tub or shower regularly. ?Attach bath mats securely with double-sided  non-slip rug tape. ?Do not have throw rugs and other things on the floor that can make you trip. ?What can I do in the bedroom? ?Use night lights. ?Make sure that you have a light by your bed that is easy to reach. ?Do not use any sheets or blankets that are too big for your bed. They should not hang down onto the floor. ?Have a firm chair that has side arms. You can use this for support while you get dressed. ?Do not have throw rugs and other things on the floor that can make you trip. ?What can I do in the kitchen? ?Clean up any spills right away. ?Avoid walking on wet floors. ?Keep items that you use a lot in easy-to-reach places. ?If you need to reach something above you, use a strong step stool that has a grab bar. ?Keep electrical cords out of the way. ?Do not use floor polish or wax that makes floors slippery. If you must use wax, use non-skid floor wax. ?Do not have throw rugs and other things on the floor that can make you trip. ?What can I do with my stairs? ?Do not leave any items on the stairs. ?Make sure that there are handrails on both sides of the stairs and use them. Fix handrails that are broken or loose. Make sure that handrails are as long as the stairways. ?Check any carpeting to make sure that it is firmly attached to the  stairs. Fix any carpet that is loose or worn. ?Avoid having throw rugs at the top or bottom of the stairs. If you do have throw rugs, attach them to the floor with carpet tape. ?Make sure that you have a light switch at the top of the stairs and the bottom of the stairs. If you do not have them, ask someone to add them for you. ?What else can I do to help prevent falls? ?Wear shoes that: ?Do not have high heels. ?Have rubber bottoms. ?Are comfortable and fit you well. ?Are closed at the toe. Do not wear sandals. ?If you use a stepladder: ?Make sure that it is fully opened. Do not climb a closed stepladder. ?Make sure that both sides of the stepladder are locked into place. ?Ask  someone to hold it for you, if possible. ?Clearly mark and make sure that you can see: ?Any grab bars or handrails. ?First and last steps. ?Where the edge of each step is. ?Use tools that help you move around (mobilit

## 2022-03-22 NOTE — Progress Notes (Signed)
? ?Subjective:  ? Sara Aguirre is a 81 y.o. female who presents for Medicare Annual (Subsequent) preventive examination. ?Virtual Visit via Telephone Note ? ?I connected with  Sara Aguirre on 03/22/22 at  2:00 PM EDT by telephone and verified that I am speaking with the correct person using two identifiers. ? ?Location: ?Patient: HOME ?Provider: [facility] ?Persons participating in the virtual visit: patient/Nurse Health Advisor ?  ?I discussed the limitations, risks, security and privacy concerns of performing an evaluation and management service by telephone and the availability of in person appointments. The patient expressed understanding and agreed to proceed. ? ?Interactive audio and video telecommunications were attempted between this nurse and patient, however failed, due to patient having technical difficulties OR patient did not have access to video capability.  We continued and completed visit with audio only. ? ?Some vital signs may be absent or patient reported.  ? ?Sara Driver, LPN ? ?Review of Systems    ? ?Cardiac Risk Factors include: advanced age (>65mn, >>97women);hypertension;dyslipidemia;sedentary lifestyle ? ?   ?Objective:  ?  ?Today's Vitals  ? 03/22/22 1355  ?Weight: 139 lb 6.4 oz (63.2 kg)  ?Height: '5\' 8"'$  (1.727 m)  ? ?Body mass index is 21.2 kg/m?. ? ? ?  03/22/2022  ?  2:10 PM 11/08/2021  ?  4:58 PM 10/08/2021  ?  6:20 PM 10/08/2021  ?  2:42 PM 04/21/2020  ?  6:48 PM 03/26/2019  ? 10:06 AM 03/26/2019  ?  9:39 AM  ?Advanced Directives  ?Does Patient Have a Medical Advance Directive? Yes Yes No No Yes Yes Yes  ?Type of AParamedicof AGoliadLiving will Healthcare Power of Attorney   Living will;Healthcare Power of [place] ?Does patient want to make changes to medical advance directive?  Yes (Inpatient - patient defers changing a medical advance directive at this time - Information given)   No - Patient  declined Yes (Inpatient - patient requests chaplain consult to change a medical advance directive) No - Patient declined  ?Copy of [place] Chart? Yes - validated most recent copy scanned in chart (See row information) No - copy requested   No - copy requested  Yes - validated most recent copy scanned in chart (See row information)  ?Would patient like information on creating a medical advance directive?   No - Patient declined      ? ? ?Current Medications (verified) ?Outpatient Encounter Medications as of 03/22/2022  ?Medication Sig  ? amLODipine (NORVASC) 10 MG tablet Take 1 tablet (10 mg total) by mouth daily. For blood pressure  ? aspirin EC 81 MG tablet Take 81 mg by mouth daily. Swallow whole.  ? Blood Pressure Monitor DEVI Use to check blood pressure 3 times a week  ? clopidogrel (PLAVIX) 75 MG tablet Take 1 tablet (75 mg total) by mouth daily. For stroke  ? cyanocobalamin 1000 MCG tablet Take 1,000 mcg by mouth daily.  ? dapagliflozin propanediol (FARXIGA) 10 MG TABS tablet Take by mouth.  ? losartan (COZAAR) 100 MG tablet Take 100 mg by mouth daily.  ? rosuvastatin (CRESTOR) 5 MG tablet Take 1 tablet (5 mg total) by mouth daily. For cholesterol.  ? UNABLE TO FIND HOLISTER UROSTOMY [id number] ? ?No facility-administered encounter medications on file as of 03/22/2022.  ? ? ?Allergies (verified) ?Imodium [loperamide], Other, and Loperamide hcl  ? ?History: ?Past Medical History:  ?  Diagnosis Date  ? Cancer [facility])   ? bladder, s/p cystectomy with ileal conduit  ? History of bladder cancer   ? History of tobacco use   ? Hyperlipidemia   ? Hypertension   ? Incarcerated ventral hernia, s/p lap repair 12/05/2011  ? 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 Ulcer, gastric, acute or chronic   ? ?Past Surgical History:  ?Procedure Laterality Date  ? BLADDER REMOVAL  10/1993  ? Dr. Risa Aguirre  ? CAROTID PTA/STENT INTERVENTION Right 11/08/2021  ? Procedure: CAROTID  PTA/STENT INTERVENTION;  Surgeon: Sara Cabal, Sara Aguirre;  Location: [place] [facility];  Service: Cardiovascular;  Laterality: Right;  ? HERNIA REPAIR  12/05/11  ? parastomal  ? LOOP RECORDER INSERTION N/A 04/23/2020  ? Procedure: LOOP RECORDER INSERTION;  Surgeon: Sara Sprang, Sara Aguirre;  Location: [place] [facility];  Service: Cardiovascular;  Laterality: N/A;  ? PARASTOMAL HERNIA REPAIR  12/05/2011  ? Procedure: HERNIA REPAIR PARASTOMAL;  Surgeon: Sara Hector, Sara Aguirre;  Location: [facility];  Service: General;;  Laprascopic lysis of adhestons with reduction and repair with biological mesh for incarcerated parastomal and ventral long incisional hernia.  ? REVISION UROSTOMY CUTANEOUS    ? VENTRAL HERNIA REPAIR  12/05/2011  ? Procedure: HERNIA REPAIR VENTRAL ADULT;  Surgeon: Sara Hector, Sara Aguirre;  Location: [facility];  Service: General;;  ? ?Family History  ?Problem Relation Age of Onset  ? Heart disease Mother 27  ?     CHF  ? Alcohol abuse Father   ? Stroke Father   ? ?Social History  ? ?Socioeconomic History  ? Marital status: Widowed  ?  Spouse name: Not on file  ? Number of children: 2  ? Years of education: Not on file  ? Highest education level: Some college, no degree  ?Occupational History  ? Occupation: retired  ?Tobacco Use  ? Smoking status: Former  ?  Types: Cigarettes  ?  Quit date: 01/02/1993  ?  Years since quitting: 29.2  ? Smokeless tobacco: Never  ?Vaping Use  ? Vaping Use: Never used  ?Substance and Sexual Activity  ? Alcohol use: No  ? Drug use: No  ? Sexual activity: Not on file  ?Other Topics Concern  ? Not on file  ?Social History Narrative  ? Marital status: widowed since 1992; not dating; not interested  ?    Children:  Twin boys (69); 2 grandchildren  ?    Lives: alone with dog, 5 chickens, 2 rabbits  ?    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    Tobacco: previous smoker; quit in 1993; smoked x 30 years  ?    Alcohol: apple brandy for Christmas  ?    Exercise: bowl two days per week.  Gardening  ?     ADLs: drives; independent with ADLs; has garden  ?    Advanced Directives: YES: full code but no prolonged measures; HCPOA: Sara Aguirre or other son Sara Aguirre; copy on chart 04/2017.  ? ?Social Determinants of Health  ? ?Financial Resource Strain: Low Risk   ? Difficulty of Paying Living Expenses: Not hard at all  ?Food Insecurity: No Food Insecurity  ? Worried About Charity fundraiser in the Last Year: Never true  ? Ran Out of Food in the Last Year: Never true  ?Transportation Needs: No Transportation Needs  ? Lack of Transportation (Medical): No  ? Lack of Transportation (Non-Medical): No  ?Physical Activity:  Insufficiently Active  ? Days of Exercise per Week: 3 days  ? Minutes of Exercise per Session: 30 min  ?Stress: No Stress Concern Present  ? Feeling of Stress : Not at all  ?Social Connections: Socially Isolated  ? Frequency of Communication with Friends and Family: More than three times a week  ? Frequency of Social Gatherings with Friends and Family: More than three times a week  ? Attends Religious Services: Never  ? Active Member of Clubs or Organizations: No  ? Attends Archivist Meetings: Never  ? Marital Status: Widowed  ? ? ?Tobacco Counseling ?Counseling given: Not Answered ? ? ?Clinical Intake: ? ?Pre-visit preparation completed: Yes ? ?Pain : No/denies pain ? ?  ? ?BMI - recorded: 21.2 ?Nutritional Status: BMI of 19-24  Normal ?Nutritional Risks: None ?Diabetes: No ? ?How often do you need to have someone help you when you read instructions, pamphlets, or other written materials from your doctor or pharmacy?: 1 - Never ? ?Diabetic?NO ? ?Interpreter Needed?: No ? ?Information entered by :: mj Starlena Beil, lpn ? ? ?Activities of Daily Living ? ?  03/22/2022  ?  2:13 PM 11/08/2021  ?  4:58 PM  ?In your present state of health, do you have any difficulty performing the following activities:  ?Hearing? 1 0  ?Vision? 0 0  ?Difficulty concentrating or making decisions? 1 0  ?Walking or  climbing stairs? 0 0  ?Dressing or bathing? 0 0  ?Doing errands, shopping? 0 0  ?Preparing Food and eating ? N   ?Using the Toilet? N   ?In the past six months, have you accidently leaked urine? N   ?Do

## 2022-03-23 NOTE — Telephone Encounter (Signed)
Completed form placed in Sara Aguirre's inbox. ?Please fax form ASAP.  Please also notify patient's son/reply to the MyChart message when this has been faxed. ?

## 2022-03-27 NOTE — Telephone Encounter (Signed)
Ppw faxed  

## 2022-04-07 DIAGNOSIS — Z936 Other artificial openings of urinary tract status: Secondary | ICD-10-CM | POA: Diagnosis not present

## 2022-04-17 DIAGNOSIS — R413 Other amnesia: Secondary | ICD-10-CM | POA: Diagnosis not present

## 2022-04-17 DIAGNOSIS — Z8673 Personal history of transient ischemic attack (TIA), and cerebral infarction without residual deficits: Secondary | ICD-10-CM | POA: Diagnosis not present

## 2022-04-17 DIAGNOSIS — F419 Anxiety disorder, unspecified: Secondary | ICD-10-CM | POA: Diagnosis not present

## 2022-04-17 DIAGNOSIS — R29818 Other symptoms and signs involving the nervous system: Secondary | ICD-10-CM | POA: Diagnosis not present

## 2022-04-17 NOTE — Telephone Encounter (Signed)
Son called back Clarise Cruz is his wife and helps take care of patient. She is not having any symptoms other than the redness in bag. Wanted to know if anything needs to be tested or just keep eye on it. He states the only thing is a urine bag full of red fluid. Has continued over the weekend.  ?

## 2022-04-17 NOTE — Telephone Encounter (Signed)
Patient needs to be seen in office.

## 2022-04-17 NOTE — Telephone Encounter (Signed)
Can we triage this please. ?

## 2022-04-17 NOTE — Telephone Encounter (Signed)
Pt's son called to ask if we received the MyChart message about the pt's ostomy bag. I informed him we have the message but we do not usually see MyChart messages over the weekend and Anda Kraft is not here on Mondays. I will forward this to [facility] for him to evaluate. ?

## 2022-04-17 NOTE — Telephone Encounter (Signed)
I left v/m for Sara Aguirre (DPR signed)requesting a cb; also left my chart note asking Sara Aguirre to call [facility]. ?

## 2022-04-17 NOTE — Telephone Encounter (Signed)
Called patient and son number. Left message at both numbers to call office.  ?

## 2022-04-18 NOTE — Telephone Encounter (Signed)
I spoke with Elta Guadeloupe (DPR signed) pt only symptom or complaint is red fluid in ostomy bag; see pt message. Mark scheduled appt with with Dr Glori Bickers on 04/19/22 at 10:30. No available appts at [facility] today. UC & ED precautions given and Elta Guadeloupe voiced understanding. Sending note to Dr Glori Bickers and Ovando CMA. ?

## 2022-04-18 NOTE — Telephone Encounter (Signed)
[place] Night - Client ?Nonclinical Telephone Record  ?[facility]? ?Client [place] Night - Client ?Client Site [place] ?Provider AA - PHYSICIAN, UNKNOWN- MD ?Contact Type Call ?Who Is Calling Patient / Member / Family / Caregiver ?Caller Name Saachi Zale ?Caller Phone Number [phone] ?Call Type Message Only Information Provided ?Reason for Call Returning a Call from the Office ?Initial Comment Caller states, returning a call from office. ?Disp. Time Disposition Final User ?04/17/2022 5:45:56 PM General Information Provided Yes Jobie Quaker ?Call Closed By: Jobie Quaker ?Transaction Date/Time: 04/17/2022 5:44:38 PM (ET ?

## 2022-04-19 ENCOUNTER — Ambulatory Visit (INDEPENDENT_AMBULATORY_CARE_PROVIDER_SITE_OTHER): Payer: PPO | Admitting: Family Medicine

## 2022-04-19 ENCOUNTER — Encounter: Payer: Self-pay | Admitting: Family Medicine

## 2022-04-19 VITALS — BP 130/70 | HR 75 | Temp 97.5°F | Ht 68.0 in | Wt 137.1 lb

## 2022-04-19 DIAGNOSIS — R319 Hematuria, unspecified: Secondary | ICD-10-CM | POA: Diagnosis not present

## 2022-04-19 DIAGNOSIS — Z8551 Personal history of malignant neoplasm of bladder: Secondary | ICD-10-CM | POA: Diagnosis not present

## 2022-04-19 DIAGNOSIS — N3090 Cystitis, unspecified without hematuria: Secondary | ICD-10-CM

## 2022-04-19 LAB — CBC WITH DIFFERENTIAL/PLATELET
Basophils Absolute: 0.1 10*3/uL (ref 0.0–0.1)
Basophils Relative: 0.7 % (ref 0.0–3.0)
Eosinophils Absolute: 0.2 10*3/uL (ref 0.0–0.7)
Eosinophils Relative: 1.7 % (ref 0.0–5.0)
HCT: 41.2 % (ref 36.0–46.0)
Hemoglobin: 13.3 g/dL (ref 12.0–15.0)
Lymphocytes Relative: 9.8 % — ABNORMAL LOW (ref 12.0–46.0)
Lymphs Abs: 0.8 10*3/uL (ref 0.7–4.0)
MCHC: 32.2 g/dL (ref 30.0–36.0)
MCV: 90 fl (ref 78.0–100.0)
Monocytes Absolute: 0.8 10*3/uL (ref 0.1–1.0)
Monocytes Relative: 8.7 % (ref 3.0–12.0)
Neutro Abs: 6.8 10*3/uL (ref 1.4–7.7)
Neutrophils Relative %: 79.1 % — ABNORMAL HIGH (ref 43.0–77.0)
Platelets: 277 10*3/uL (ref 150.0–400.0)
RBC: 4.58 Mil/uL (ref 3.87–5.11)
RDW: 15.1 % (ref 11.5–15.5)
WBC: 8.7 10*3/uL (ref 4.0–10.5)

## 2022-04-19 LAB — BASIC METABOLIC PANEL
BUN: 18 mg/dL (ref 6–23)
CO2: 29 mEq/L (ref 19–32)
Calcium: 9.6 mg/dL (ref 8.4–10.5)
Chloride: 102 mEq/L (ref 96–112)
Creatinine, Ser: 1.66 mg/dL — ABNORMAL HIGH (ref 0.40–1.20)
GFR: 28.41 mL/min — ABNORMAL LOW (ref >60.00)
Glucose, Bld: 123 mg/dL — ABNORMAL HIGH (ref 70–99)
Potassium: 4.6 mEq/L (ref 3.5–5.1)
Sodium: 138 mEq/L (ref 135–145)

## 2022-04-19 LAB — POC URINALSYSI DIPSTICK (AUTOMATED)
Bilirubin, UA: NEGATIVE
Blood, UA: 200
Glucose, UA: POSITIVE — AB
Ketones, UA: 5
Nitrite, UA: NEGATIVE
Protein, UA: POSITIVE — AB
Spec Grav, UA: 1.015 (ref 1.010–1.025)
Urobilinogen, UA: 2 E.U./dL — AB
pH, UA: 6.5 (ref 5.0–8.0)

## 2022-04-19 MED ORDER — CEPHALEXIN 500 MG PO CAPS: 500.0000 mg | ORAL_CAPSULE | Freq: Two times a day (BID) | ORAL | 0 refills | Status: DC

## 2022-04-19 NOTE — Assessment & Plan Note (Signed)
Pos ua with gross hematuria  ?Has ostomy for urine  ?cx pending  ?Px keflex  ?ER precautions/red flags rev-watch for dizziness/fever/malaise will update  ?

## 2022-04-19 NOTE — Patient Instructions (Addendum)
Aim for 64 oz of fluids daily (mostly water)  ? ?You likely have a uti  ?Take the keflex as directed ?We will culture your urine and call you with the result  ? ?If you feel sick or dizzy or if you have fever or nausea let us know and go to the ER if severe  ? ? ? ? ?

## 2022-04-19 NOTE — Progress Notes (Signed)
? ?Subjective:  ? ? Patient ID: Sara Aguirre, female    DOB: 1941/05/10, 81 y.o.   MRN: [id number] ? ?HPI ?81 yo pt of [facility] present for change in urine color in ostomy bag (red) ? ?Wt Readings from Last 3 Encounters:  ?04/19/22 137 lb 2 oz (62.2 kg)  ?03/22/22 139 lb 6.4 oz (63.2 kg)  ?12/08/21 142 lb (64.4 kg)  ? ?20.85 kg/m? ? 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 ?Red urine  ?Started a week ago (clear up until then) ?At first had black specks in it  ? ?Her ostomy occ puts out mucous but not this  ? ?No discomfort-just noticed it ? ?Feels fine ?No other symptoms  ?No fever or nausea  ? ?Has cut back on salt  ?Bp at home was 149F this am systolic ? ?Not enough water  ? ? ?BP Readings from Last 3 Encounters:  ?04/19/22 130/70  ?12/08/21 (!) 181/67  ?11/09/21 (!) 116/52  ? ? ?Pulse Readings from Last 3 Encounters:  ?04/19/22 75  ?12/08/21 72  ?11/09/21 62  ? ? ?Patient Active Problem List  ? Diagnosis Date Noted  ? Cystitis 04/19/2022  ? Carotid stenosis, symptomatic, with infarction ([place]) 11/08/2021  ? Bilateral carotid artery stenosis 10/20/2021  ? History of TIA (transient ischemic attack) 10/08/2021  ? CKD (chronic kidney disease), stage IIIb 10/08/2021  ? COVID-19 virus infection 10/08/2021  ? Benign hypertensive kidney disease with chronic kidney disease 09/27/2021  ? Hematuria 09/27/2021  ? Proteinuria 09/27/2021  ? History of CVA (cerebrovascular accident) 04/21/2020  ? Hypokalemia 04/21/2020  ? Pure hypercholesterolemia 01/17/2017  ? Thyroid nodule 08/20/2013  ? HTN (hypertension) 08/20/2013  ? Body mass index (BMI) of 24.0-24.9 in adult 08/20/2013  ? Gastric ulcer 12/29/2011  ? Renal insufficiency 12/29/2011  ? History of bladder cancer, s/p cystectomy with ileal conduit   ? ?Past Medical History:  ?Diagnosis Date   ? Cancer [facility])   ? bladder, s/p cystectomy with ileal conduit  ? History of bladder cancer   ? History of tobacco use   ? Hyperlipidemia   ? Hypertension   ? Incarcerated ventral hernia, s/p lap repair 12/05/2011  ? Parastomal hernia of ileal conduit, s/p lap repair [id number] 12/04/2011  ? Ulcer, gastric, acute or chronic   ? ?Past Surgical History:  ?Procedure Laterality Date  ? BLADDER REMOVAL  10/1993  ? Dr. Risa Grill  ? CAROTID PTA/STENT INTERVENTION Right 11/08/2021  ? Procedure: CAROTID PTA/STENT INTERVENTION;  Surgeon: Katha Cabal, MD;  Location: [place] [facility];  Service: Cardiovascular;  Laterality: Right;  ? HERNIA REPAIR  12/05/11  ? parastomal  ? LOOP RECORDER INSERTION N/A 04/23/2020  ? Procedure: LOOP RECORDER INSERTION;  Surgeon: Deboraha Sprang, MD;  Location: [place] [facility];  Service: Cardiovascular;  Laterality: N/A;  ? PARASTOMAL HERNIA REPAIR  12/05/2011  ? Procedure: HERNIA REPAIR PARASTOMAL;  Surgeon: Adin Hector, MD;  Location: [facility];  Service: General;;  Laprascopic lysis of adhestons with reduction and repair with biological mesh for incarcerated parastomal and ventral long incisional hernia.  ? REVISION UROSTOMY CUTANEOUS    ? VENTRAL HERNIA REPAIR  12/05/2011  ? Procedure: HERNIA REPAIR VENTRAL ADULT;  Surgeon: Adin Hector, MD;  Location: [facility];  Service: General;;  ? ?  Social History  ? ?Tobacco Use  ? Smoking status: Former  ?  Types: Cigarettes  ?  Quit date: 01/02/1993  ?  Years since quitting: 29.3  ? Smokeless tobacco: Never  ?Vaping Use  ? Vaping Use: Never used  ?Substance Use Topics  ? Alcohol use: No  ? Drug use: No  ? ?Family History  ?Problem Relation Age of Onset  ? Heart disease Mother 67  ?     CHF  ? Alcohol abuse Father   ? Stroke Father   ? ?Allergies  ?Allergen Reactions  ? Imodium [Loperamide]   ?  Itchy all over  ? Other   ?  Also allergic to a "cough medicine" but patient does not remember the name  ? Loperamide Hcl Rash  ? ?Current Outpatient  Medications on File Prior to Visit  ?Medication Sig Dispense Refill  ? amLODipine (NORVASC) 10 MG tablet Take 1 tablet (10 mg total) by mouth daily. For blood pressure 90 tablet 3  ? aspirin EC 81 MG tablet Take 81 mg by mouth daily. Swallow whole.    ? Blood Pressure Monitor DEVI Use to check blood pressure 3 times a week 1 Device 0  ? clopidogrel (PLAVIX) 75 MG tablet Take 1 tablet (75 mg total) by mouth daily. For stroke 90 tablet 3  ? cyanocobalamin 1000 MCG tablet Take 1,000 mcg by mouth daily.    ? dapagliflozin propanediol (FARXIGA) 10 MG TABS tablet Take by mouth.    ? losartan (COZAAR) 100 MG tablet Take 100 mg by mouth daily.    ? rosuvastatin (CRESTOR) 5 MG tablet Take 1 tablet (5 mg total) by mouth daily. For cholesterol. 90 tablet 3  ? UNABLE TO FIND HOLISTER UROSTOMY POUCHES ITEM 8478 100 Device 3  ? ?No current facility-administered medications on file prior to visit.  ?  ? ?Review of Systems  ?Constitutional:  Negative for activity change, appetite change, fatigue, fever and unexpected weight change.  ?HENT:  Negative for congestion, ear pain, rhinorrhea, sinus pressure and sore throat.   ?Eyes:  Negative for pain, redness and visual disturbance.  ?Respiratory:  Negative for cough, shortness of breath and wheezing.   ?Cardiovascular:  Negative for chest pain and palpitations.  ?Gastrointestinal:  Negative for abdominal pain, blood in stool, constipation and diarrhea.  ?Endocrine: Negative for polydipsia and polyuria.  ?Genitourinary:  Positive for hematuria. Negative for difficulty urinating, dyspareunia, dysuria, flank pain, frequency, pelvic pain and urgency.  ?Musculoskeletal:  Negative for arthralgias, back pain and myalgias.  ?Skin:  Negative for pallor and rash.  ?Allergic/Immunologic: Negative for environmental allergies.  ?Neurological:  Negative for dizziness, syncope and headaches.  ?Hematological:  Negative for adenopathy. Does not bruise/bleed easily.  ?Psychiatric/Behavioral:  Negative  for decreased concentration and dysphoric mood. The patient is not nervous/anxious.   ? ?   ?Objective:  ? Physical Exam ?Constitutional:   ?   General: She is not in acute distress. ?   Appearance: Normal appearance. She is well-developed and normal weight. She is not ill-appearing or diaphoretic.  ?HENT:  ?   Head: Normocephalic and atraumatic.  ?   Mouth/Throat:  ?   Mouth: Mucous membranes are moist.  ?Eyes:  ?   General: No scleral icterus. ?   Conjunctiva/sclera: Conjunctivae normal.  ?   Pupils: Pupils are equal, round, and reactive to light.  ?Neck:  ?   Thyroid: No thyromegaly.  ?   Vascular: No carotid bruit or JVD.  ?Cardiovascular:  ?  Rate and Rhythm: Normal rate and regular rhythm.  ?   Heart sounds: Normal heart sounds.  ?  No gallop.  ?Pulmonary:  ?   Effort: Pulmonary effort is normal. No respiratory distress.  ?   Breath sounds: Normal breath sounds. No wheezing or rales.  ?Abdominal:  ?   General: There is no distension or abdominal bruit.  ?   Palpations: Abdomen is soft. There is no mass.  ?   Tenderness: There is no abdominal tenderness. There is no right CVA tenderness, left CVA tenderness, guarding or rebound.  ?   Hernia: No hernia is present.  ?Genitourinary: ?   Comments: Urine in ostomy bag is dark red ?Musculoskeletal:  ?   Cervical back: Normal range of motion and neck supple.  ?   Right lower leg: No edema.  ?   Left lower leg: No edema.  ?Lymphadenopathy:  ?   Cervical: No cervical adenopathy.  ?Skin: ?   General: Skin is warm and dry.  ?   Coloration: Skin is not pale.  ?   Findings: No rash.  ?Neurological:  ?   Mental Status: She is alert.  ?   Coordination: Coordination normal.  ?   Deep Tendon Reflexes: Reflexes are normal and symmetric. Reflexes normal.  ?Psychiatric:     ?   Mood and Affect: Mood normal.  ? ? ? ? ? ?   ?Assessment & Plan:  ? ?Problem List Items Addressed This Visit   ? ?  ? Genitourinary  ? Cystitis  ?  Pos ua with gross hematuria  ?Has ostomy for urine  ?cx  pending  ?Px keflex  ?ER precautions/red flags rev-watch for dizziness/fever/malaise will update  ? ?  ?  ?  ? Other  ? Hematuria - Primary  ?  Gross hematuria in pt with ostomy (past bladder cancer) ?No other

## 2022-04-19 NOTE — Assessment & Plan Note (Signed)
Gross hematuria in pt with ostomy (past bladder cancer) ?No other symptoms  ?ua pos for wbc as well-uti is likely cause  ?cx pending- tx with keflex  ?Cbc,bmet (ckd) as well -pending  ?inst to inc fluids/water intake ?Watch for fever/malaise/dizziness/etc ?ER precautions  ?Will need urology f/u if not uti/not improved  ?Watch closely  ?

## 2022-04-21 LAB — URINE CULTURE
MICRO NUMBER:: 13346114
SPECIMEN QUALITY:: ADEQUATE

## 2022-04-24 ENCOUNTER — Telehealth: Payer: Self-pay

## 2022-04-24 NOTE — Telephone Encounter (Signed)
Left vm for patient to call back regarding lab results. ?

## 2022-04-24 NOTE — Telephone Encounter (Signed)
-----   Message from Abner Greenspan, MD sent at 04/21/2022  7:16 PM EDT ----- ?Culture confirmed uti and the keflex should work for it  ?Let us know if symptoms are not improving (blood does not go away)  ?Cc to PCP ?

## 2022-05-02 DIAGNOSIS — R319 Hematuria, unspecified: Secondary | ICD-10-CM | POA: Diagnosis not present

## 2022-05-02 DIAGNOSIS — N184 Chronic kidney disease, stage 4 (severe): Secondary | ICD-10-CM | POA: Diagnosis not present

## 2022-05-02 DIAGNOSIS — R600 Localized edema: Secondary | ICD-10-CM | POA: Diagnosis not present

## 2022-05-02 DIAGNOSIS — I10 Essential (primary) hypertension: Secondary | ICD-10-CM | POA: Diagnosis not present

## 2022-05-24 ENCOUNTER — Encounter: Payer: Self-pay | Admitting: Urology

## 2022-05-24 ENCOUNTER — Ambulatory Visit (INDEPENDENT_AMBULATORY_CARE_PROVIDER_SITE_OTHER): Payer: PPO | Admitting: Urology

## 2022-05-24 VITALS — BP 160/64 | HR 108 | Ht 68.0 in | Wt 139.0 lb

## 2022-05-24 DIAGNOSIS — Z8551 Personal history of malignant neoplasm of bladder: Secondary | ICD-10-CM

## 2022-05-24 DIAGNOSIS — R310 Gross hematuria: Secondary | ICD-10-CM | POA: Diagnosis not present

## 2022-05-24 LAB — MICROSCOPIC EXAMINATION
RBC, Urine: >30 /hpf — AB (ref 0–2)
WBC, UA: >30 /hpf — AB (ref 0–5)

## 2022-05-24 LAB — URINALYSIS, COMPLETE
Bilirubin, UA: NEGATIVE
Nitrite, UA: POSITIVE — AB
Specific Gravity, UA: 1.015 (ref 1.005–1.030)
Urobilinogen, Ur: 1 mg/dL (ref 0.2–1.0)
pH, UA: 5.5 (ref 5.0–7.5)

## 2022-05-24 NOTE — Progress Notes (Signed)
05/24/2022 1:53 PM   Sara Aguirre 1941/03/11 [id number]  Referring provider: Murlean Iba, MD [address] [place],  [place] [address]  Chief Complaint  Patient presents with   Hematuria    HPI: Sara Aguirre is a 81 y.o. female referred for evaluation of hematuria.  History of bladder cancer who underwent radical cystectomy with ileal conduit in [place] urologic follow-up was in [place] blood in ostomy bag in early May 2023.  Was having no other symptoms.  Urine was sent for culture and positive for E. coli and she was treated with cephalexin Denies flank, abdominal or pelvic pain Last imaging was a renal ultrasound in 2022 which showed renal parenchymal thinning and mild to moderate hydronephrosis On Plavix for cerebrovascular disease   PMH: Past Medical History:  Diagnosis Date   Cancer ([place])    bladder, s/p cystectomy with ileal conduit   History of bladder cancer    History of tobacco use    Hyperlipidemia    Hypertension    Incarcerated ventral hernia, s/p lap repair 12/05/2011   Parastomal hernia of ileal conduit, s/p lap repair [id number] 12/04/2011   Ulcer, gastric, acute or chronic     Surgical History: Past Surgical History:  Procedure Laterality Date   BLADDER REMOVAL  10/1993   Dr. Risa Grill   CAROTID PTA/STENT INTERVENTION Right 11/08/2021   Procedure: CAROTID PTA/STENT INTERVENTION;  Surgeon: Katha Cabal, MD;  Location: [place] [facility];  Service: Cardiovascular;  Laterality: Right;   HERNIA REPAIR  12/05/11   parastomal   LOOP RECORDER INSERTION N/A 04/23/2020   Procedure: LOOP RECORDER INSERTION;  Surgeon: Deboraha Sprang, MD;  Location: [place] [facility];  Service: Cardiovascular;  Laterality: N/A;   PARASTOMAL HERNIA REPAIR  12/05/2011   Procedure: HERNIA REPAIR PARASTOMAL;  Surgeon: Adin Hector, MD;  [facility];  Service: General;;  Laprascopic lysis of adhestons with reduction and repair with  biological mesh for incarcerated parastomal and ventral long incisional hernia.   REVISION UROSTOMY CUTANEOUS     VENTRAL HERNIA REPAIR  12/05/2011   Procedure: HERNIA REPAIR VENTRAL ADULT;  Surgeon: Adin Hector, MD;  [facility];  Service: General;;    Home Medications:  Allergies as of 05/24/2022       Reactions   Imodium [loperamide]    Itchy all over   Other    Also allergic to a "cough medicine" but patient does not remember the name   Loperamide Hcl Rash        Medication List        Accurate as of May 24, 2022  1:53 PM. If you have any questions, ask your nurse or doctor.          amLODipine 10 MG tablet Commonly known as: NORVASC Take 1 tablet (10 mg total) by mouth daily. For blood pressure   aspirin EC 81 MG tablet Take 81 mg by mouth daily. Swallow whole.   Blood Pressure Monitor Devi Use to check blood pressure 3 times a week   cephALEXin 500 MG capsule Commonly known as: KEFLEX Take 1 capsule (500 mg total) by mouth 2 (two) times daily.   clopidogrel 75 MG tablet Commonly known as: PLAVIX Take 1 tablet (75 mg total) by mouth daily. For stroke   cyanocobalamin 1000 MCG tablet Take 1,000 mcg by mouth daily.   dapagliflozin propanediol 10 MG Tabs tablet Commonly known as: FARXIGA Take by mouth.   losartan  100 MG tablet Commonly known as: COZAAR Take 100 mg by mouth daily.   rosuvastatin 5 MG tablet Commonly known as: Crestor Take 1 tablet (5 mg total) by mouth daily. For cholesterol.   UNABLE TO FIND HOLISTER UROSTOMY POUCHES ITEM [id number]        Allergies:  Allergies  Allergen Reactions   Imodium [Loperamide]     Itchy all over   Other     Also allergic to a "cough medicine" but patient does not remember the name   Loperamide Hcl Rash    Family History: Family History  Problem Relation Age of Onset   Heart disease Mother 71       CHF   Alcohol abuse Father    Stroke Father     Social History:  reports that she quit  smoking about 29 years ago. Her smoking use included cigarettes. She has never used smokeless tobacco. She reports that she does not drink alcohol and does not use drugs.   Physical Exam: BP (!) 160/64   Pulse (!) 108   Ht '5\' 8"'$  (1.727 m)   Wt 139 lb (63 kg)   BMI 21.13 kg/m   Constitutional:  Alert and oriented, No acute distress. HEENT: [place] AT, moist mucus membranes.  Trachea midline, no masses. Cardiovascular: No clubbing, cyanosis, or edema. Respiratory: Normal respiratory effort, no increased work of breathing. GI: Abdomen is soft, nontender, nondistended, no abdominal masses GU: Conduit pink, healthy.  Blood-tinged urine and ostomy bag Skin: No rashes, bruises or suspicious lesions. Neurologic: Grossly intact, no focal deficits, moving all 4 extremities. Psychiatric: Normal mood and affect.   Assessment & Plan:    1. 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  History urothelial carcinoma bladder  Abbie Sons, MD  [facility] [address] [place], [place] [address] [phone]

## 2022-05-25 ENCOUNTER — Other Ambulatory Visit (INDEPENDENT_AMBULATORY_CARE_PROVIDER_SITE_OTHER): Payer: Self-pay | Admitting: Nurse Practitioner

## 2022-05-25 DIAGNOSIS — I6523 Occlusion and stenosis of bilateral carotid arteries: Secondary | ICD-10-CM

## 2022-05-27 ENCOUNTER — Inpatient Hospital Stay: Payer: PPO

## 2022-05-27 ENCOUNTER — Encounter: Payer: Self-pay | Admitting: Intensive Care

## 2022-05-27 ENCOUNTER — Inpatient Hospital Stay
Admission: EM | Admit: 2022-05-27 | Discharge: 2022-05-31 | DRG: 696 | Disposition: A | Discharge status: Home / Self Care | Payer: PPO | Attending: Internal Medicine | Admitting: Internal Medicine

## 2022-05-27 ENCOUNTER — Emergency Department: Payer: PPO

## 2022-05-27 ENCOUNTER — Other Ambulatory Visit: Payer: Self-pay

## 2022-05-27 DIAGNOSIS — Z823 Family history of stroke: Secondary | ICD-10-CM | POA: Diagnosis not present

## 2022-05-27 DIAGNOSIS — K5900 Constipation, unspecified: Secondary | ICD-10-CM | POA: Diagnosis not present

## 2022-05-27 DIAGNOSIS — Z7902 Long term (current) use of antithrombotics/antiplatelets: Secondary | ICD-10-CM

## 2022-05-27 DIAGNOSIS — N2889 Other specified disorders of kidney and ureter: Secondary | ICD-10-CM | POA: Diagnosis present

## 2022-05-27 DIAGNOSIS — Z811 Family history of alcohol abuse and dependence: Secondary | ICD-10-CM | POA: Diagnosis not present

## 2022-05-27 DIAGNOSIS — Z8551 Personal history of malignant neoplasm of bladder: Secondary | ICD-10-CM | POA: Diagnosis not present

## 2022-05-27 DIAGNOSIS — Z906 Acquired absence of other parts of urinary tract: Secondary | ICD-10-CM

## 2022-05-27 DIAGNOSIS — N134 Hydroureter: Secondary | ICD-10-CM | POA: Diagnosis not present

## 2022-05-27 DIAGNOSIS — G9389 Other specified disorders of brain: Secondary | ICD-10-CM | POA: Diagnosis not present

## 2022-05-27 DIAGNOSIS — N200 Calculus of kidney: Secondary | ICD-10-CM | POA: Diagnosis not present

## 2022-05-27 DIAGNOSIS — N1832 Chronic kidney disease, stage 3b: Secondary | ICD-10-CM | POA: Diagnosis not present

## 2022-05-27 DIAGNOSIS — I6523 Occlusion and stenosis of bilateral carotid arteries: Secondary | ICD-10-CM | POA: Diagnosis not present

## 2022-05-27 DIAGNOSIS — E785 Hyperlipidemia, unspecified: Secondary | ICD-10-CM | POA: Diagnosis not present

## 2022-05-27 DIAGNOSIS — N136 Pyonephrosis: Secondary | ICD-10-CM | POA: Diagnosis not present

## 2022-05-27 DIAGNOSIS — B9620 Unspecified Escherichia coli [E. coli] as the cause of diseases classified elsewhere: Secondary | ICD-10-CM | POA: Diagnosis present

## 2022-05-27 DIAGNOSIS — N179 Acute kidney failure, unspecified: Secondary | ICD-10-CM | POA: Diagnosis not present

## 2022-05-27 DIAGNOSIS — D649 Anemia, unspecified: Secondary | ICD-10-CM

## 2022-05-27 DIAGNOSIS — Z87891 Personal history of nicotine dependence: Secondary | ICD-10-CM

## 2022-05-27 DIAGNOSIS — R531 Weakness: Secondary | ICD-10-CM | POA: Diagnosis not present

## 2022-05-27 DIAGNOSIS — N183 Chronic kidney disease, stage 3 unspecified: Secondary | ICD-10-CM | POA: Diagnosis present

## 2022-05-27 DIAGNOSIS — I082 Rheumatic disorders of both aortic and tricuspid valves: Secondary | ICD-10-CM | POA: Diagnosis present

## 2022-05-27 DIAGNOSIS — Z79899 Other long term (current) drug therapy: Secondary | ICD-10-CM | POA: Diagnosis not present

## 2022-05-27 DIAGNOSIS — I739 Peripheral vascular disease, unspecified: Secondary | ICD-10-CM | POA: Diagnosis not present

## 2022-05-27 DIAGNOSIS — R310 Gross hematuria: Principal | ICD-10-CM | POA: Diagnosis present

## 2022-05-27 DIAGNOSIS — N390 Urinary tract infection, site not specified: Secondary | ICD-10-CM | POA: Diagnosis present

## 2022-05-27 DIAGNOSIS — R55 Syncope and collapse: Secondary | ICD-10-CM | POA: Diagnosis not present

## 2022-05-27 DIAGNOSIS — I129 Hypertensive chronic kidney disease with stage 1 through stage 4 chronic kidney disease, or unspecified chronic kidney disease: Secondary | ICD-10-CM | POA: Diagnosis present

## 2022-05-27 DIAGNOSIS — E876 Hypokalemia: Secondary | ICD-10-CM | POA: Diagnosis present

## 2022-05-27 DIAGNOSIS — D62 Acute posthemorrhagic anemia: Secondary | ICD-10-CM | POA: Diagnosis present

## 2022-05-27 DIAGNOSIS — Z7982 Long term (current) use of aspirin: Secondary | ICD-10-CM

## 2022-05-27 DIAGNOSIS — Z8673 Personal history of transient ischemic attack (TIA), and cerebral infarction without residual deficits: Secondary | ICD-10-CM

## 2022-05-27 DIAGNOSIS — Z95820 Peripheral vascular angioplasty status with implants and grafts: Secondary | ICD-10-CM

## 2022-05-27 DIAGNOSIS — N3001 Acute cystitis with hematuria: Secondary | ICD-10-CM | POA: Diagnosis not present

## 2022-05-27 DIAGNOSIS — Z936 Other artificial openings of urinary tract status: Secondary | ICD-10-CM

## 2022-05-27 DIAGNOSIS — I10 Essential (primary) hypertension: Secondary | ICD-10-CM | POA: Diagnosis not present

## 2022-05-27 DIAGNOSIS — R1111 Vomiting without nausea: Secondary | ICD-10-CM | POA: Diagnosis not present

## 2022-05-27 DIAGNOSIS — Z888 Allergy status to other drugs, medicaments and biological substances status: Secondary | ICD-10-CM

## 2022-05-27 DIAGNOSIS — I959 Hypotension, unspecified: Secondary | ICD-10-CM | POA: Diagnosis not present

## 2022-05-27 DIAGNOSIS — R319 Hematuria, unspecified: Secondary | ICD-10-CM

## 2022-05-27 DIAGNOSIS — N1330 Unspecified hydronephrosis: Secondary | ICD-10-CM | POA: Diagnosis not present

## 2022-05-27 DIAGNOSIS — Z8249 Family history of ischemic heart disease and other diseases of the circulatory system: Secondary | ICD-10-CM | POA: Diagnosis not present

## 2022-05-27 DIAGNOSIS — I639 Cerebral infarction, unspecified: Secondary | ICD-10-CM | POA: Diagnosis not present

## 2022-05-27 LAB — TYPE AND SCREEN
ABO/RH(D): O NEG
Antibody Screen: NEGATIVE

## 2022-05-27 LAB — BASIC METABOLIC PANEL
Anion gap: 7 (ref 5–15)
BUN: 23 mg/dL (ref 8–23)
CO2: 24 mmol/L (ref 22–32)
Calcium: 8.8 mg/dL — ABNORMAL LOW (ref 8.9–10.3)
Chloride: 104 mmol/L (ref 98–111)
Creatinine, Ser: 1.52 mg/dL — ABNORMAL HIGH (ref 0.44–1.00)
GFR, Estimated: 34 mL/min — ABNORMAL LOW (ref >60)
Glucose, Bld: 153 mg/dL — ABNORMAL HIGH (ref 70–99)
Potassium: 3.4 mmol/L — ABNORMAL LOW (ref 3.5–5.1)
Sodium: 135 mmol/L (ref 135–145)

## 2022-05-27 LAB — CBC
HCT: 31.7 % — ABNORMAL LOW (ref 36.0–46.0)
Hemoglobin: 9.8 g/dL — ABNORMAL LOW (ref 12.0–15.0)
MCH: 28.1 pg (ref 26.0–34.0)
MCHC: 30.9 g/dL (ref 30.0–36.0)
MCV: 90.8 fL (ref 80.0–100.0)
Platelets: 349 10*3/uL (ref 150–400)
RBC: 3.49 MIL/uL — ABNORMAL LOW (ref 3.87–5.11)
RDW: 13.1 % (ref 11.5–15.5)
WBC: 17.9 10*3/uL — ABNORMAL HIGH (ref 4.0–10.5)
nRBC: 0 % (ref 0.0–0.2)

## 2022-05-27 LAB — HEMOGLOBIN: Hemoglobin: 9.5 g/dL — ABNORMAL LOW (ref 12.0–15.0)

## 2022-05-27 IMAGING — US US CAROTID DUPLEX BILAT
1 series · 13 of 24 positions shown · non-contrast
Comparison: None Available.

CLINICAL DATA: Syncope

EXAM:
BILATERAL CAROTID DUPLEX ULTRASOUND
TECHNIQUE: Gray scale imaging, color Doppler and duplex ultrasound were
performed of bilateral carotid and vertebral arteries in the neck.

[Series 1: us carotid bilateral · 13 of 64 slices shown]
[im 1/64]
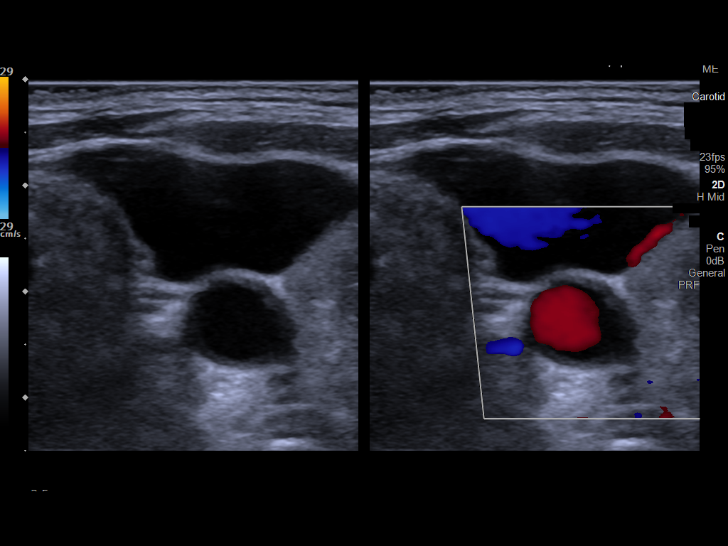
[im 6/64]
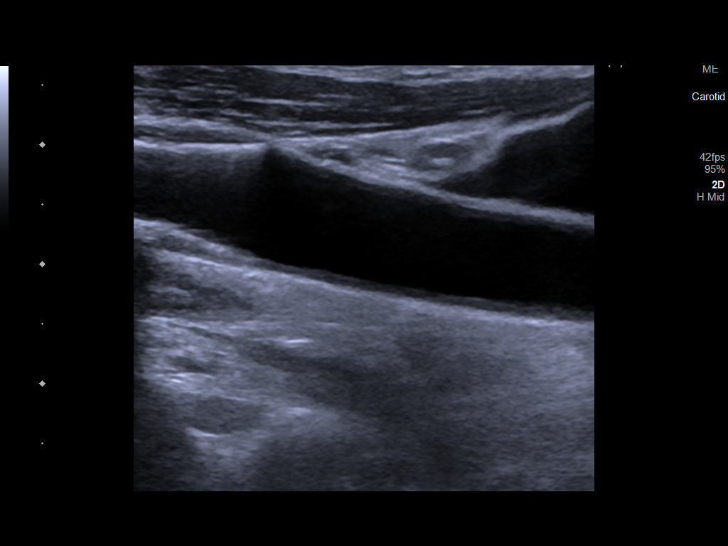
[im 11/64]
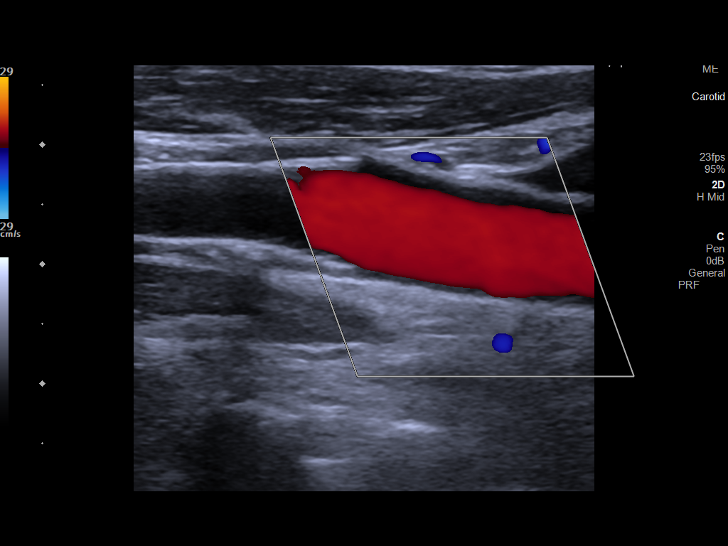
[im 17/64]
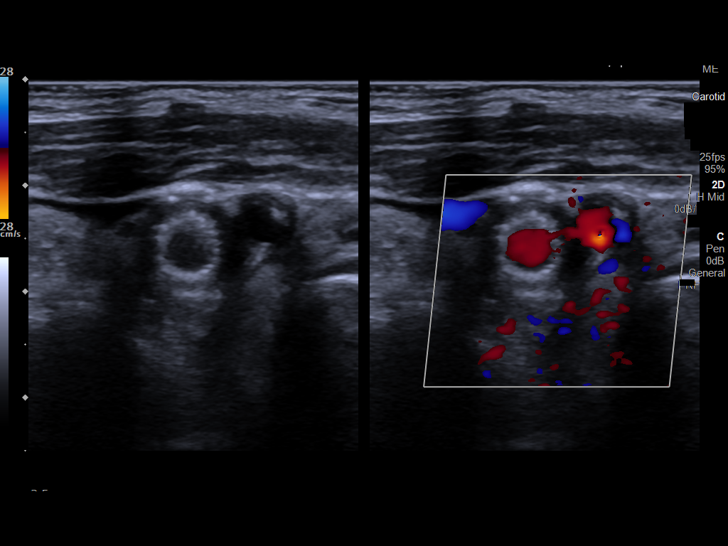
[im 22/64]
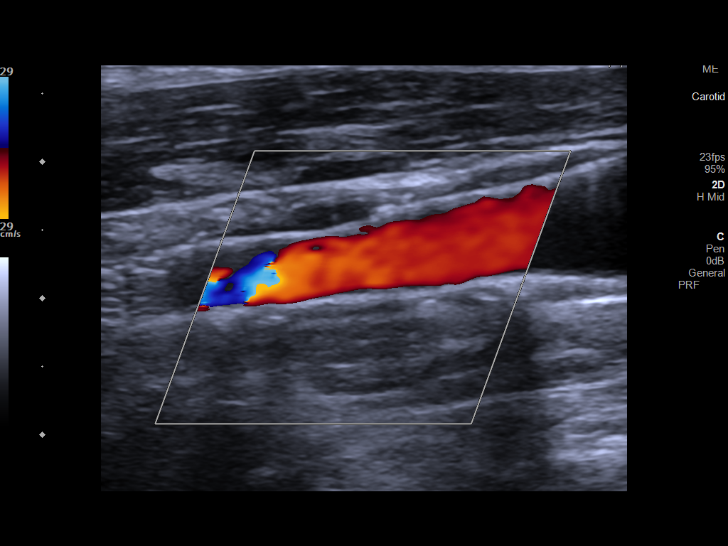
[im 28/64]
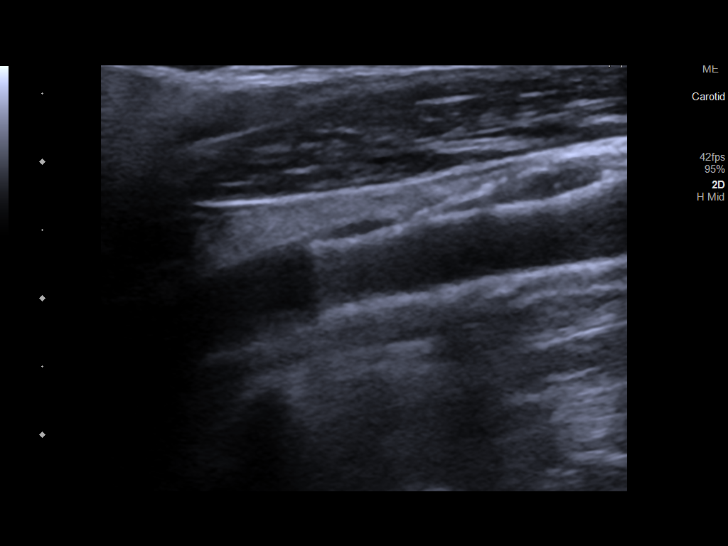
[im 33/64]
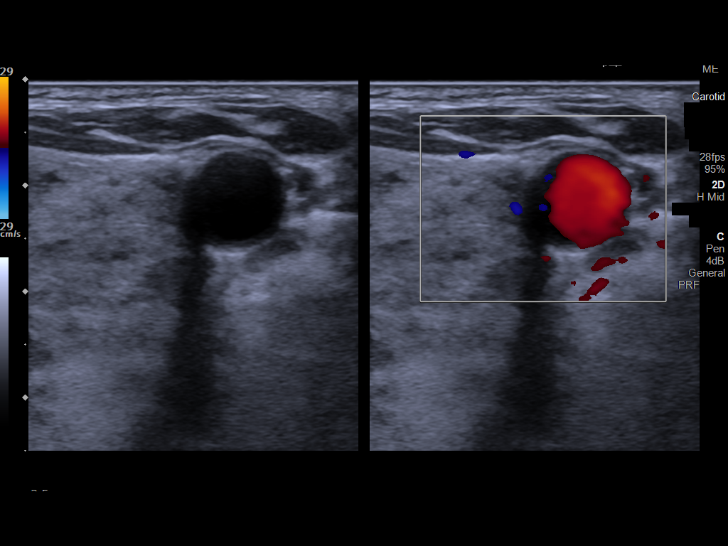
[im 36/64]
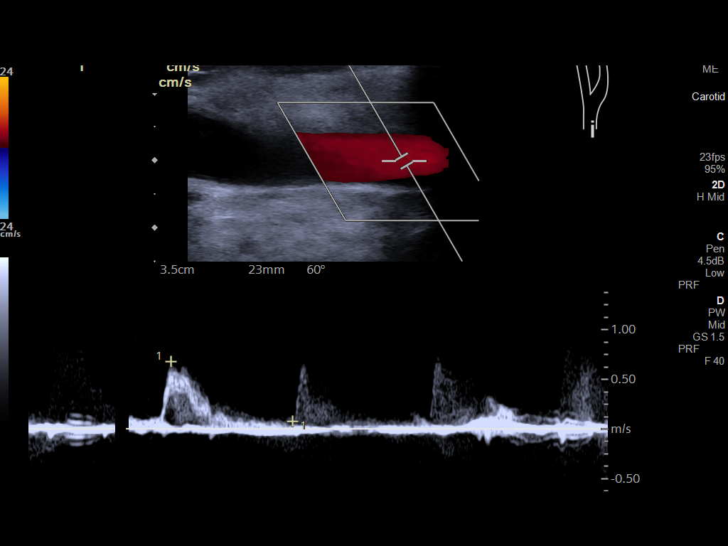
[im 42/64]
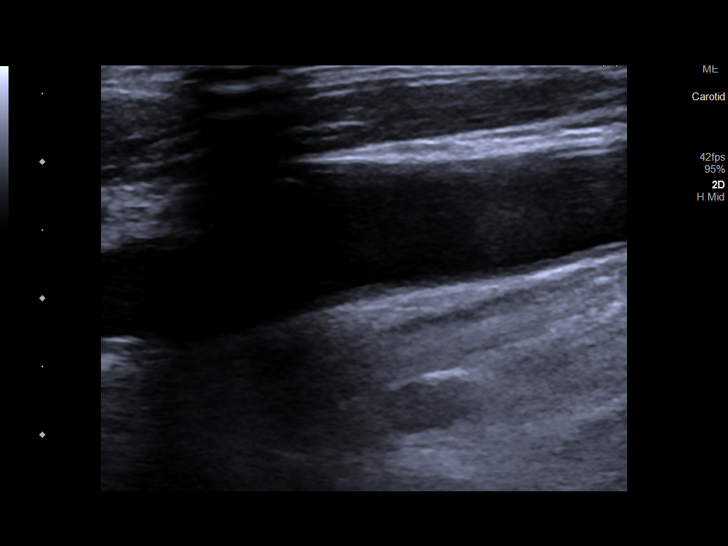
[im 47/64]
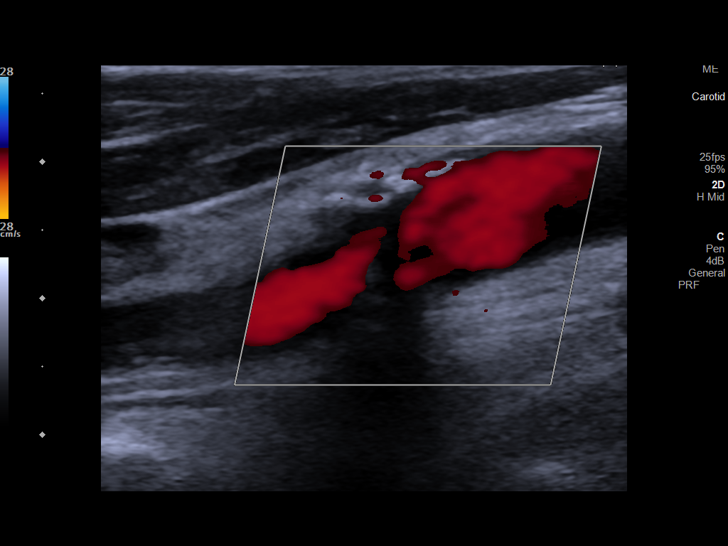
[im 53/64]
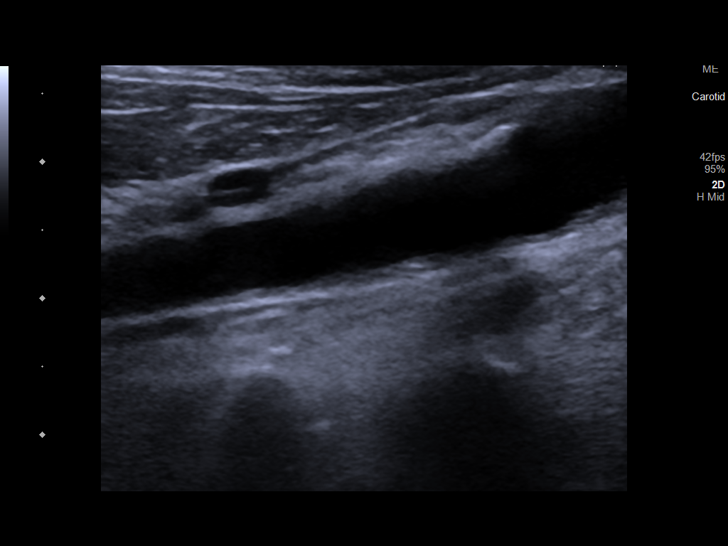
[im 58/64]
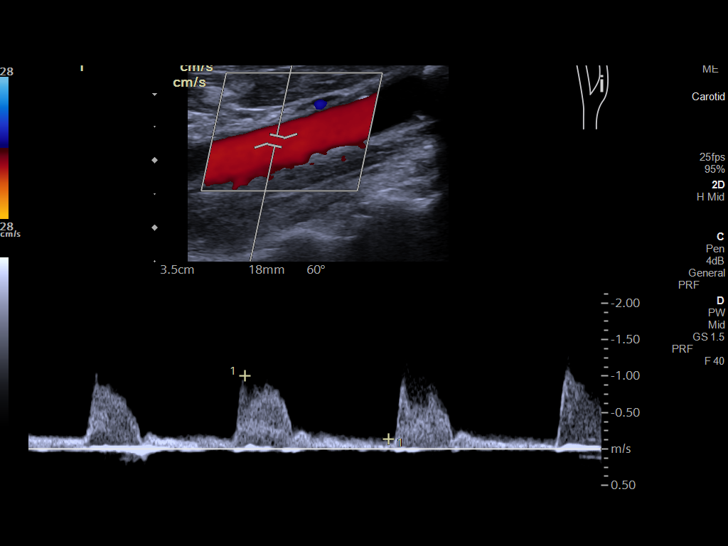
[im 64/64]
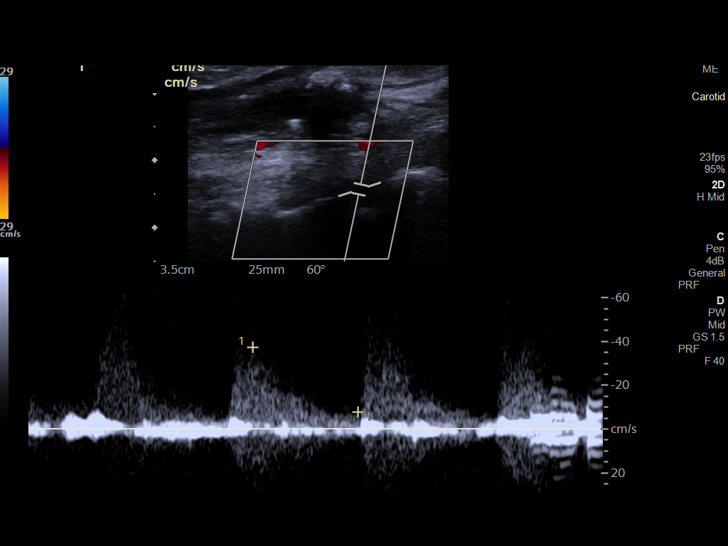

[13 of 24 positions shown; findings below may reference images not displayed]

FINDINGS: Criteria: Quantification of carotid stenosis is based on velocity
parameters that correlate the residual internal carotid diameter
with NASCET-based stenosis levels, using the diameter of the distal
internal carotid lumen as the denominator for stenosis measurement.

The following velocity measurements were obtained:

RIGHT
ICA: 91/26 cm/sec
CCA: 45/11 cm/sec

SYSTOLIC ICA/CCA RATIO:  2

ECA:  173 cm/sec

LEFT

ICA: 100/14 cm/sec

CCA: 48/7 cm/sec

SYSTOLIC ICA/CCA RATIO:

ECA:  88 cm/sec

RIGHT CAROTID ARTERY: Trace heterogeneous atherosclerotic plaque in
the proximal internal carotid artery. By peak systolic velocity
criteria there is no significant stenosis.

RIGHT VERTEBRAL ARTERY:  Patent with antegrade flow.

LEFT CAROTID ARTERY: Trace smooth heterogeneous atherosclerotic
plaque. By peak systolic velocity criteria, the estimated stenosis
is less than 50%.

LEFT VERTEBRAL ARTERY:  Patent with normal antegrade flow.
IMPRESSION: 1. Mild (1-49%) stenosis proximal right internal carotid artery
secondary to mild smooth heterogeneous atherosclerotic plaque.
2. Mild (1-49%) stenosis proximal left internal carotid artery
secondary to mild smooth heterogeneous atherosclerotic plaque.
3. Vertebral arteries are patent with normal antegrade flow.

## 2022-05-27 IMAGING — CT CT HEAD W/O CM
4 series · 17 of 47 positions shown, 19 images · non-contrast
Comparison: [DATE]

CLINICAL DATA: Syncope/presyncope, cerebrovascular cause suspected



[Series 2: head wo · axial · 0.40mm/px · z∈[-120,+0]mm · 7 of 33 slices shown, 9 images]
[im 5/33  brain]
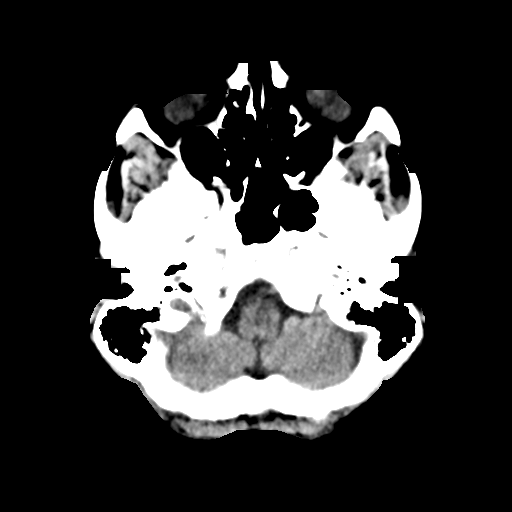
[im 5/33  bone]
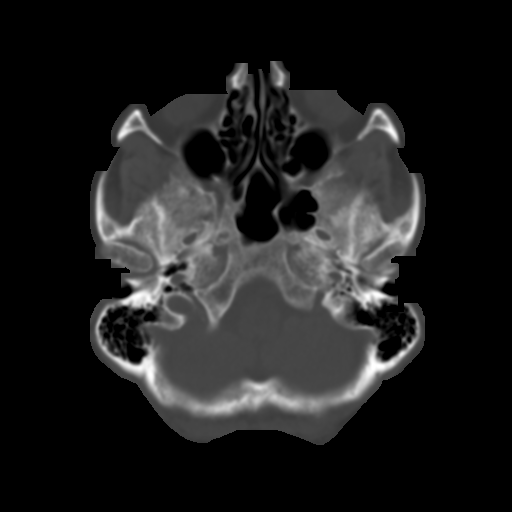
[im 9/33  brain]
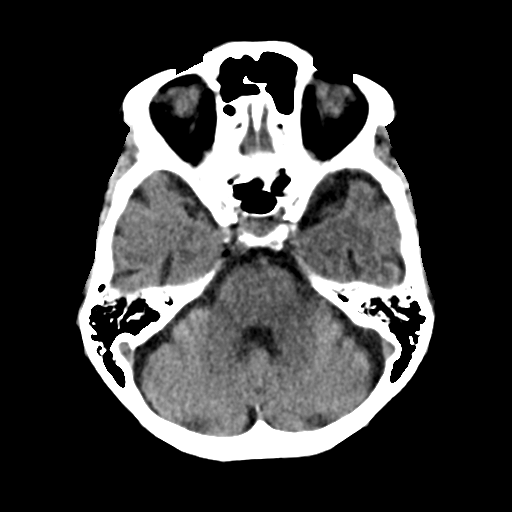
[im 13/33  brain]
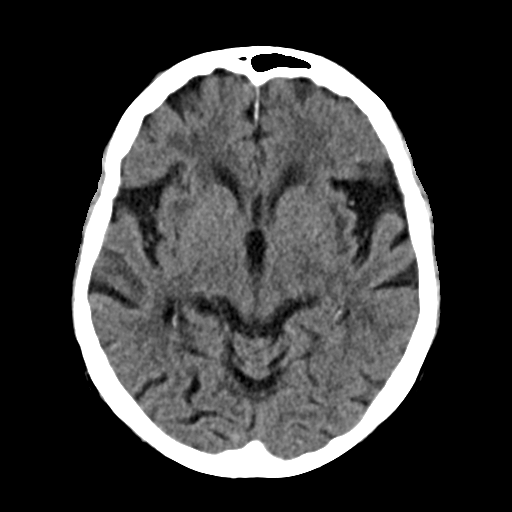
[im 17/33  brain]
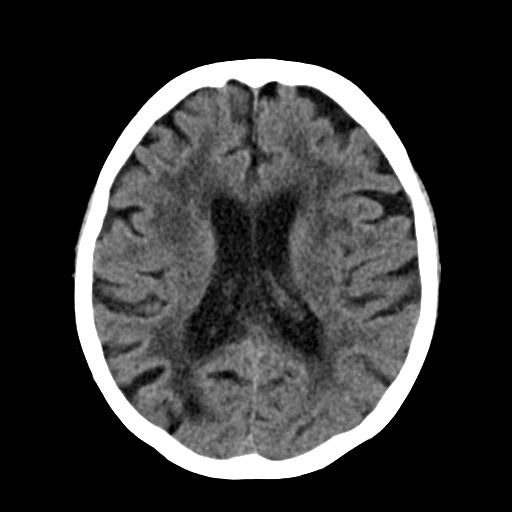
[im 21/33  brain]
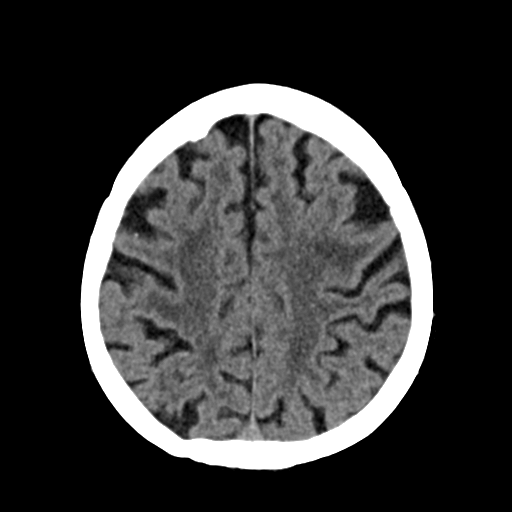
[im 21/33  bone]
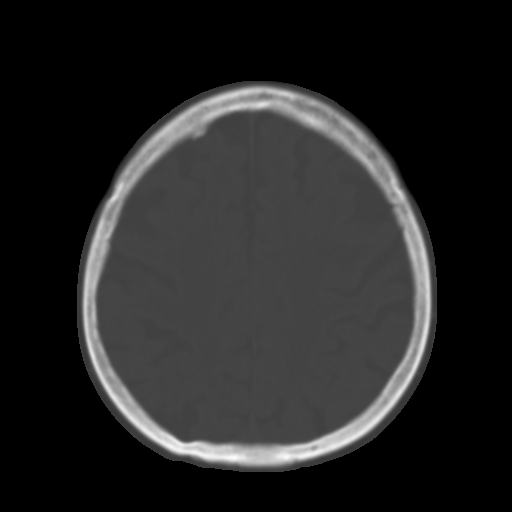
[im 25/33  brain]
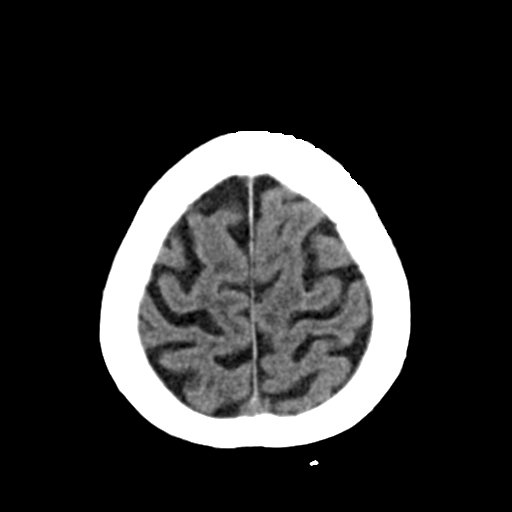
[im 29/33  brain]
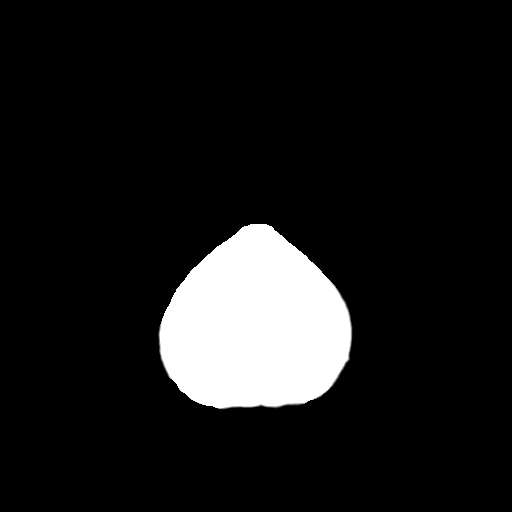

[Series 3: head bone · axial · 0.40mm/px · z∈[-124,-68]mm · 4 of 81 slices shown]
[im 9/81  bone]
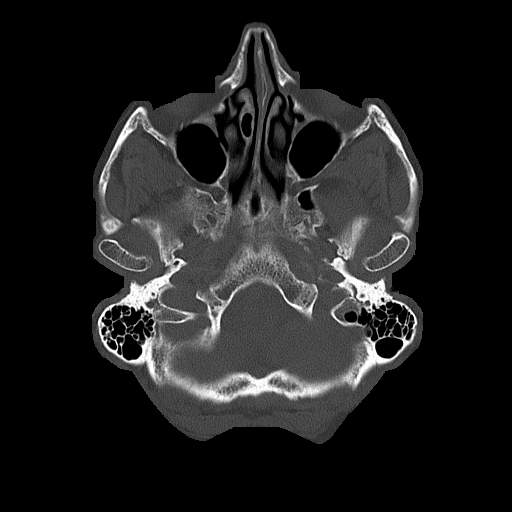
[im 17/81  bone]
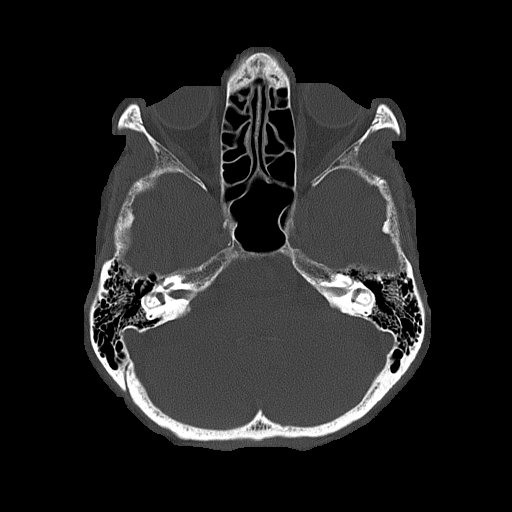
[im 25/81  bone]
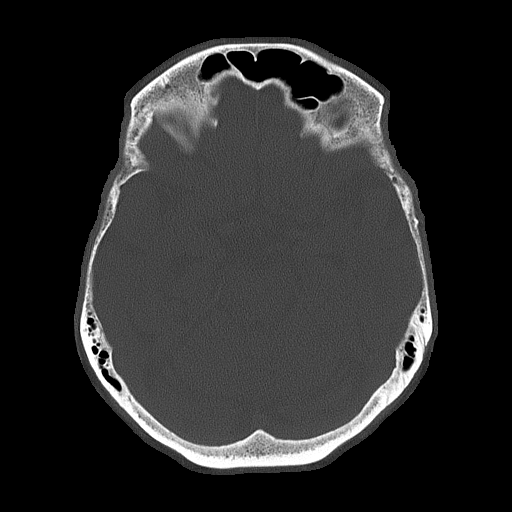
[im 37/81  bone]
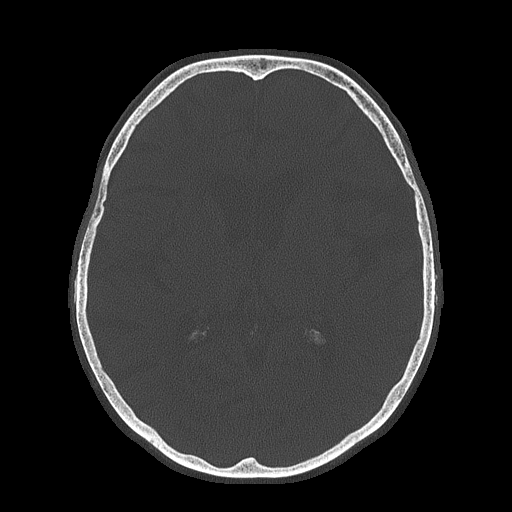

[Series 4: coronal soft tissue · coronal · 0.34mm/px · 3 of 59 slices shown]
[im 20/59  brain]
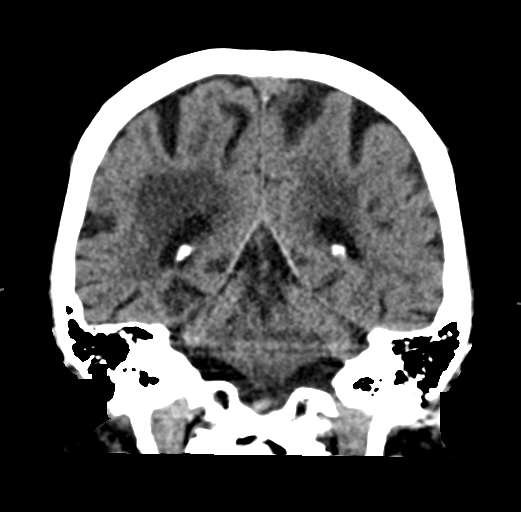
[im 26/59  brain]
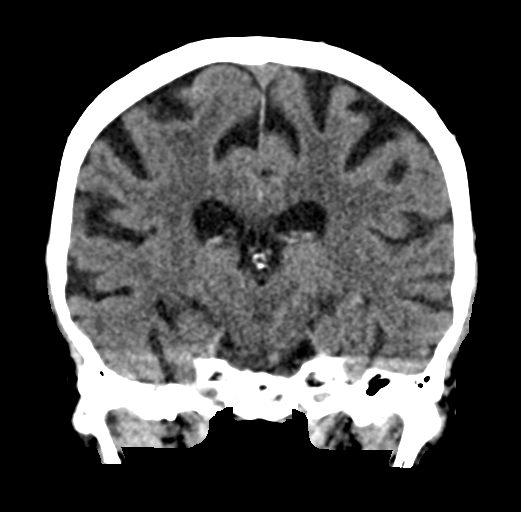
[im 33/59  brain]
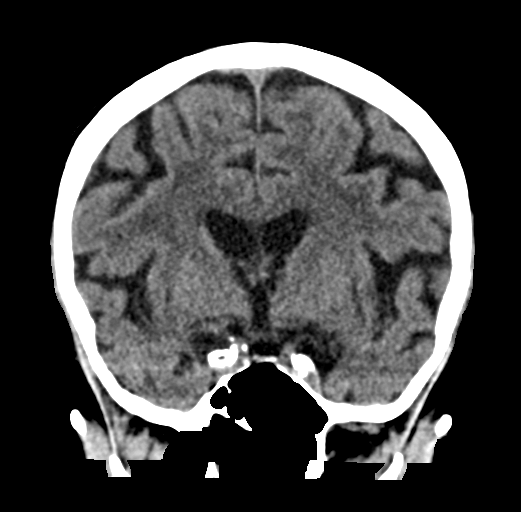

[Series 5: sagittal soft tissue · sagittal · 0.32mm/px · 3 of 51 slices shown]
[im 17/51  brain]
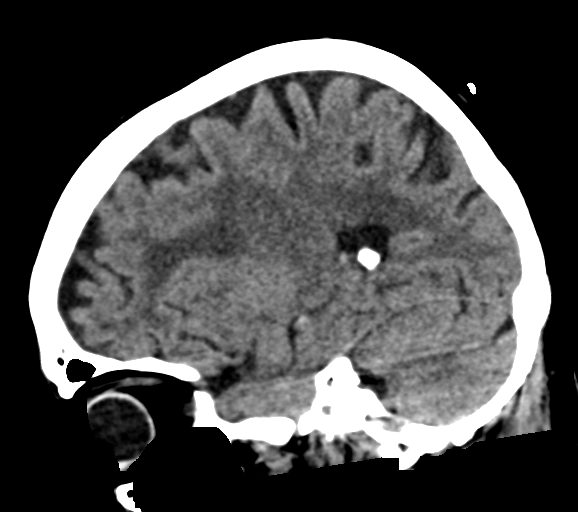
[im 26/51  brain]
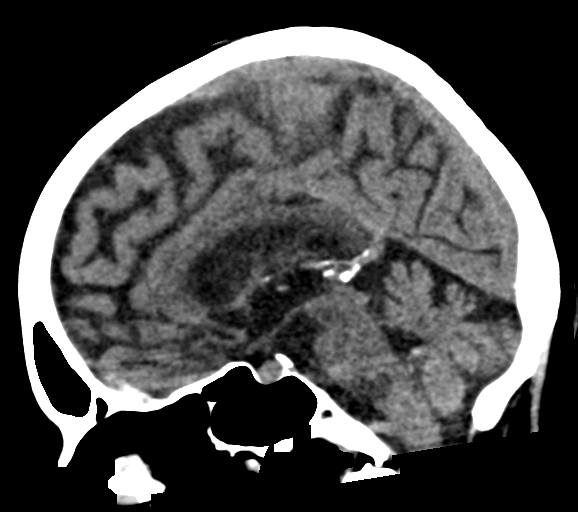
[im 34/51  brain]
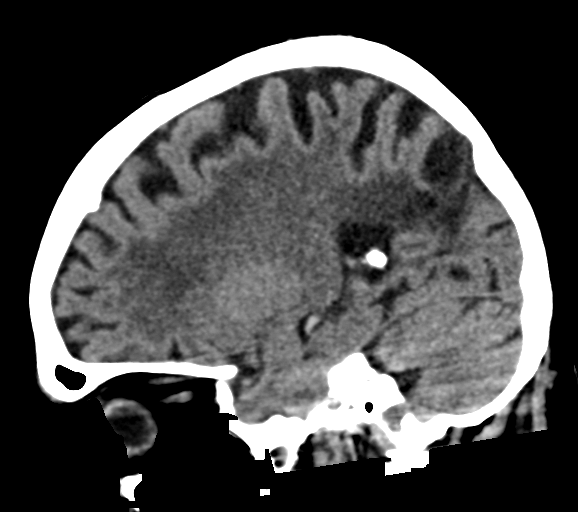

[17 of 47 positions shown; findings below may reference images not displayed]

FINDINGS: Brain: Old right posterior parietal infarct with encephalomalacia.
There is atrophy and chronic small vessel disease changes. No acute
intracranial abnormality. Specifically, no hemorrhage,
hydrocephalus, mass lesion, acute infarction, or significant
intracranial injury.

Vascular: No hyperdense vessel or unexpected calcification.

Skull: No acute calvarial abnormality.

Sinuses/Orbits: No acute findings

Other: None
IMPRESSION: Old right posterior parietal infarct.

Atrophy, chronic microvascular disease.

No acute intracranial abnormality.

## 2022-05-27 IMAGING — CT CT ABD-PELV W/O CM
2 of 4 series · 16 of 46 positions shown, 18 images · non-contrast
Comparison: [DATE]

CLINICAL DATA: Syncope, flank pain, vomiting, history of bladder
cancer status post cystectomy and ileal conduit urinary diversion



[Series 2: ap without · axial · non-contrast · 0.71mm/px · z∈[+1166,+1541]mm · 13 of 85 slices shown, 15 images]
[im 5/85  soft-tissue]
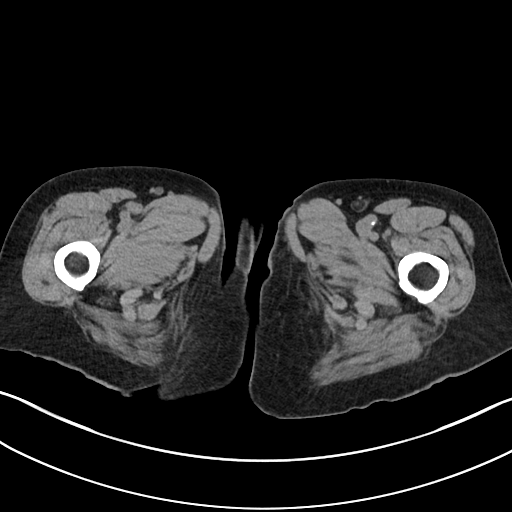
[im 5/85  bone]
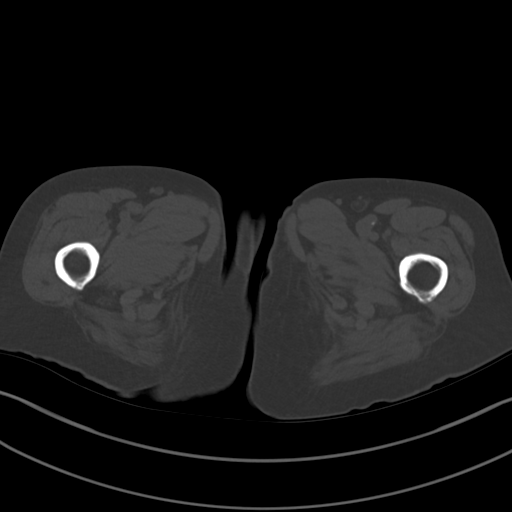
[im 10/85  soft-tissue]
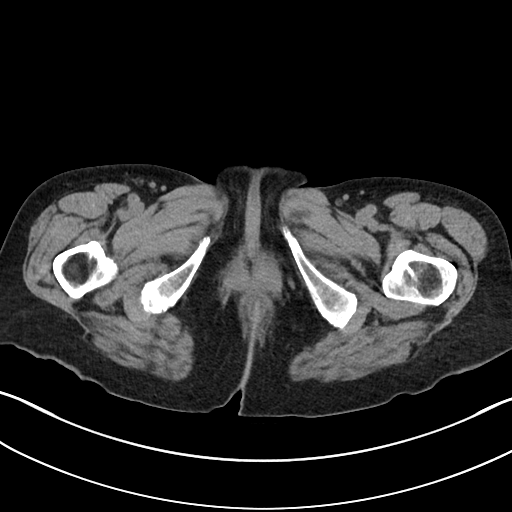
[im 19/85  soft-tissue]
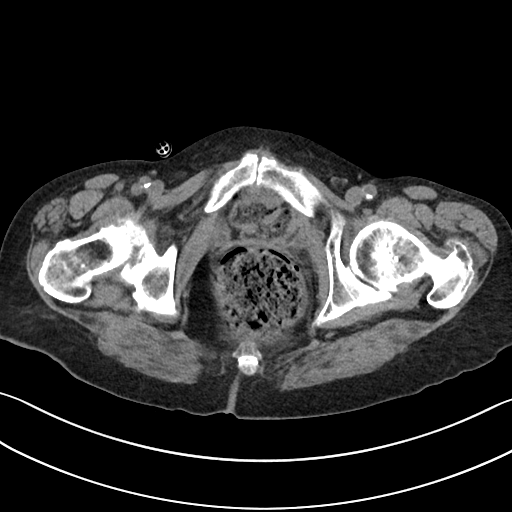
[im 24/85  soft-tissue]
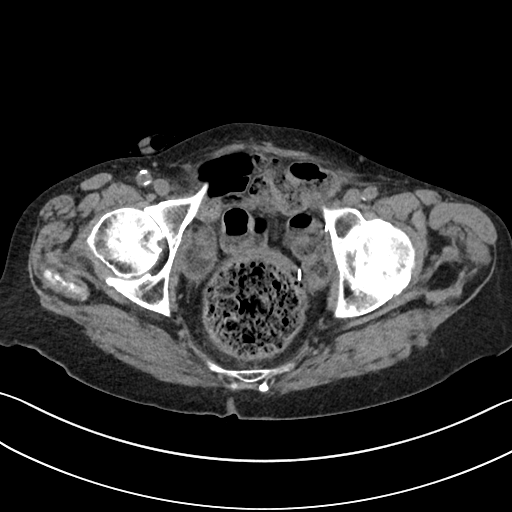
[im 29/85  soft-tissue]
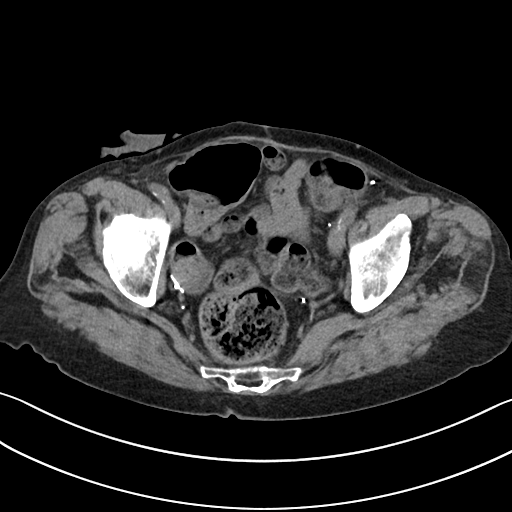
[im 38/85  soft-tissue]
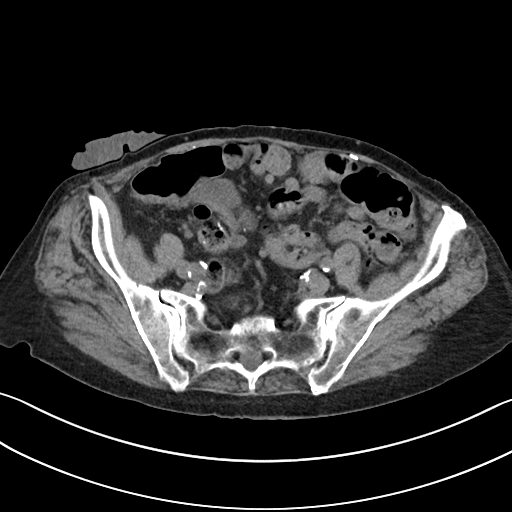
[im 43/85  soft-tissue]
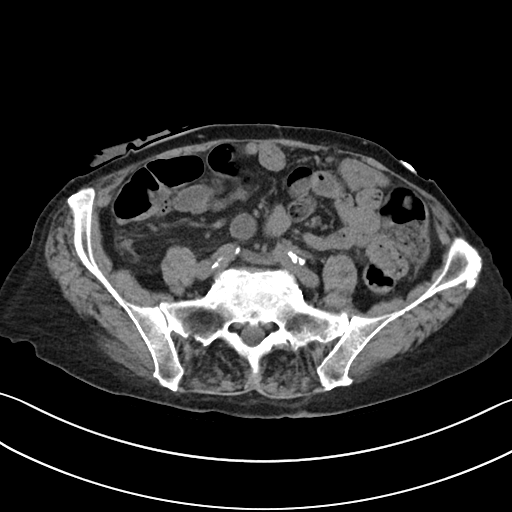
[im 47/85  soft-tissue]
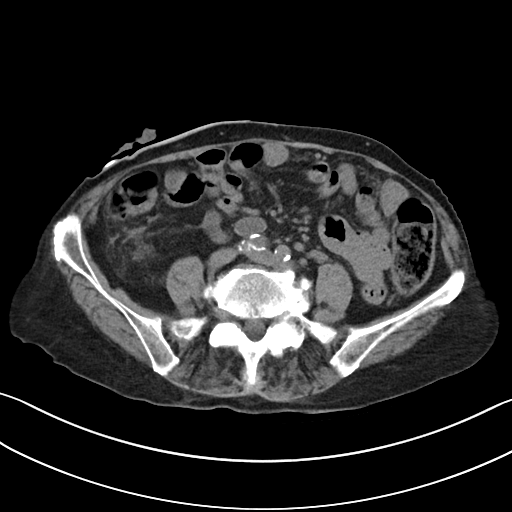
[im 57/85  soft-tissue]
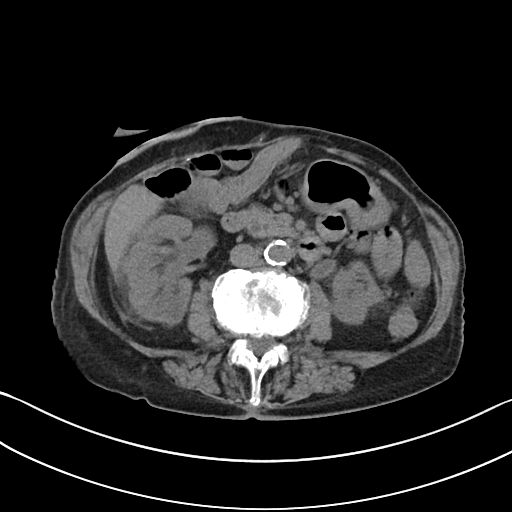
[im 57/85  bone]
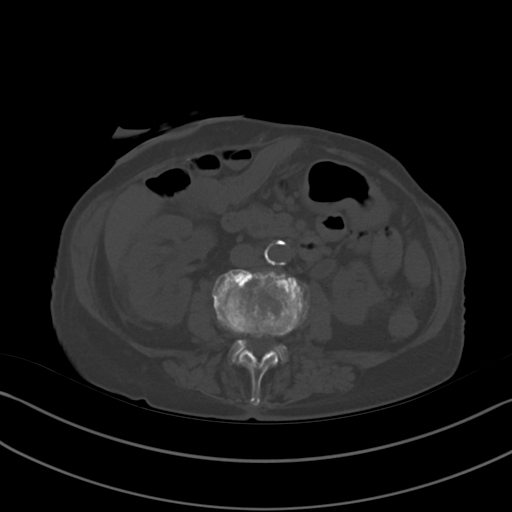
[im 61/85  soft-tissue]
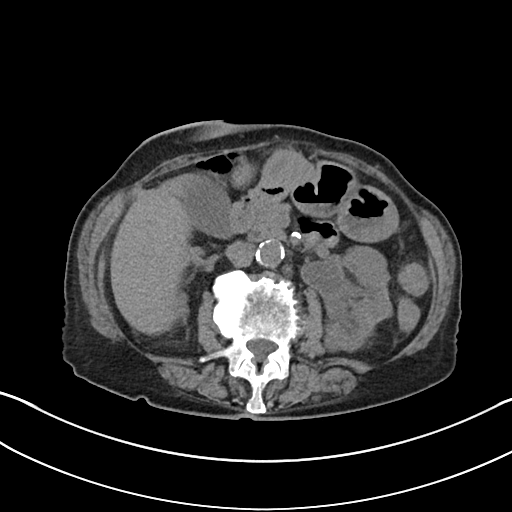
[im 66/85  soft-tissue]
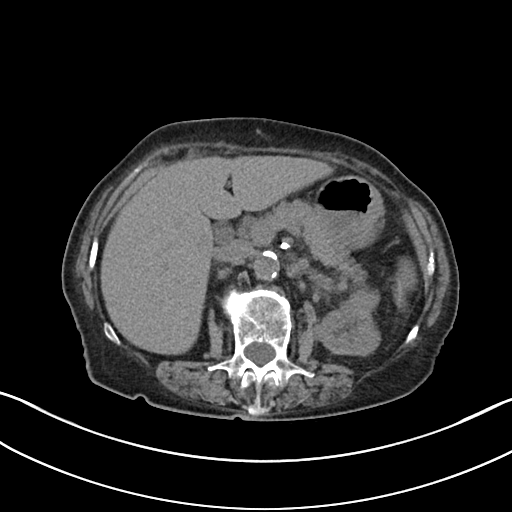
[im 75/85  soft-tissue]
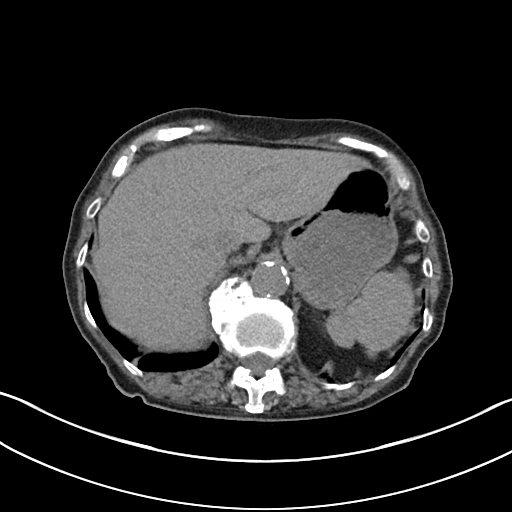
[im 80/85  soft-tissue]
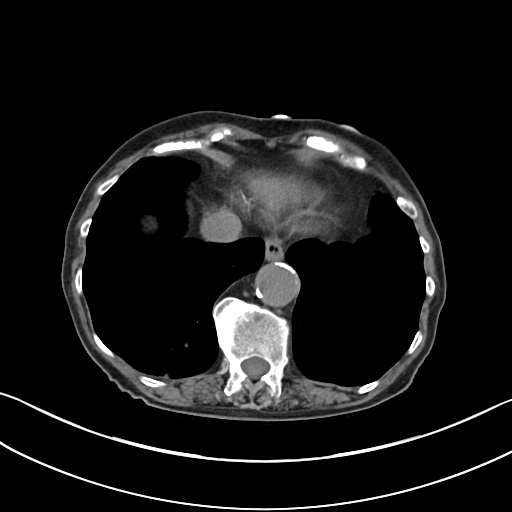

[Series 5: cor · coronal · 0.78mm/px · 3 of 75 slices shown]
[im 25/75  soft-tissue]
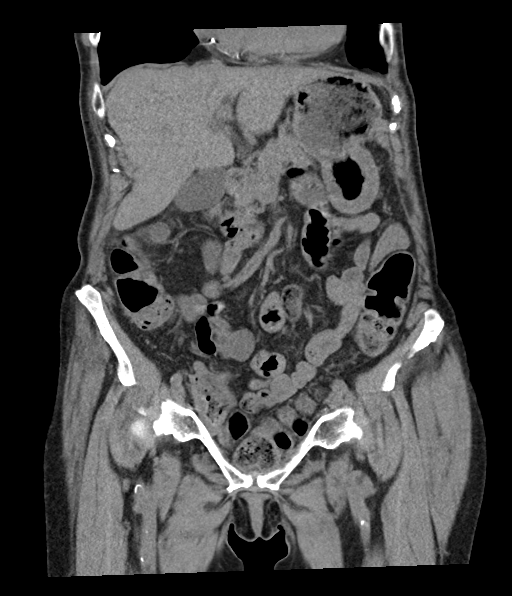
[im 33/75  soft-tissue]
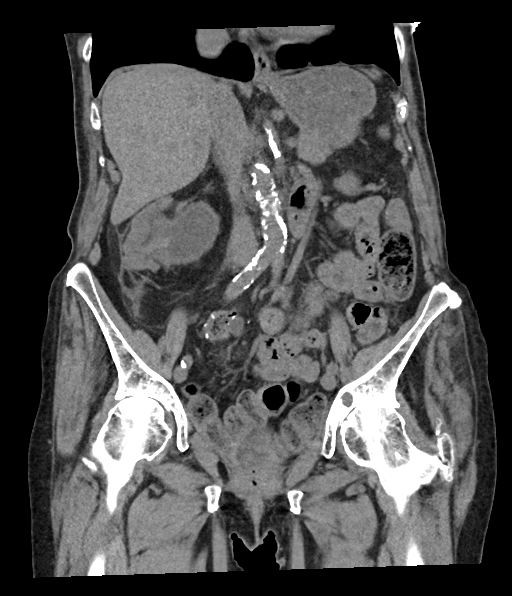
[im 42/75  soft-tissue]
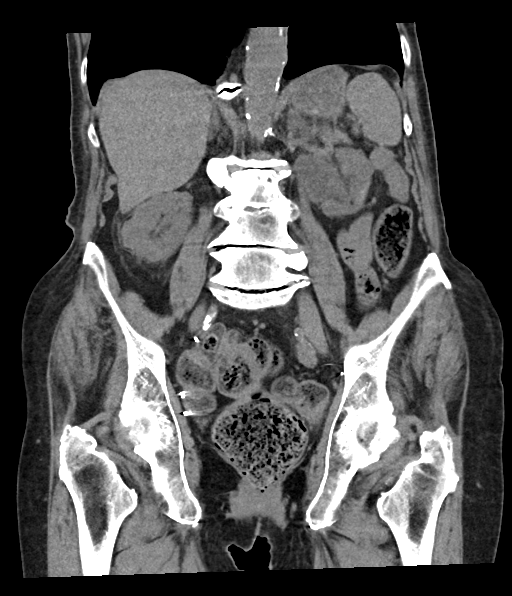

[16 of 46 positions shown; findings below may reference images not displayed]

FINDINGS: Lower chest: No acute abnormality.  Coronary artery calcifications.

Hepatobiliary: No solid liver abnormality is seen. No gallstones,
gallbladder wall thickening, or biliary dilatation.

Pancreas: Unremarkable. No pancreatic ductal dilatation or
surrounding inflammatory changes.

Spleen: Normal in size without significant abnormality.

Adrenals/Urinary Tract: Adrenal glands are unremarkable. Status post
cystectomy with right lower quadrant ileal conduit urinary
diversion. Severe bilateral hydronephrosis and hydroureter to the
ileal conduit, appearance and configuration not significant changed
compared to prior examination dated [RZ]. Extensive, lobular renal
cortical scarring.

Stomach/Bowel: Stomach is within normal limits. Appendix appears
normal. No evidence of bowel wall thickening, distention, or
inflammatory changes. Large burden of stool in the distal colon and
rectum, rectal stool ball measuring at least 10.0 cm (series 6,
image 61).

Vascular/Lymphatic: Aortic atherosclerosis. No enlarged abdominal or
pelvic lymph nodes.

Reproductive: Status post hysterectomy.

Other: Right lower quadrant ileal conduit urinary diversion.
Previously seen parastomal hernia is reduced (series 2, image 32).
No ascites.

Musculoskeletal: No acute or significant osseous findings.
IMPRESSION: 1. No acute noncontrast CT findings of the abdomen or pelvis.
2. Status post cystectomy with right lower quadrant ileal conduit
urinary diversion. Severe bilateral hydronephrosis and hydroureter
to the ileal conduit, appearance and configuration not significantly
changed compared to prior examination dated [RZ]. Extensive, lobular
renal cortical scarring.
3. Large burden of stool in the distal colon and rectum, rectal
stool ball measuring at least 10.0 cm. Correlate for fecal
impaction.
4. Coronary artery disease.

Aortic Atherosclerosis ([RZ]-[RZ]).

## 2022-05-27 MED ORDER — ROSUVASTATIN CALCIUM 10 MG PO TABS
5.0000 mg | ORAL_TABLET | Freq: Every day | ORAL | Status: DC
  Administered 2022-05-28 – 2022-05-31 (×4): 5 mg via ORAL
  Filled 2022-05-27 (×4): qty 1

## 2022-05-27 MED ORDER — SODIUM CHLORIDE 0.9 % IV SOLN: INTRAVENOUS | Status: AC

## 2022-05-27 MED ORDER — ACETAMINOPHEN 650 MG RE SUPP: 650.0000 mg | Freq: Four times a day (QID) | RECTAL | Status: DC | PRN

## 2022-05-27 MED ORDER — SODIUM CHLORIDE 0.9 % IV SOLN
1.0000 g | Freq: Once | INTRAVENOUS | Status: AC
  Administered 2022-05-27: 1 g via INTRAVENOUS
  Filled 2022-05-27: qty 10

## 2022-05-27 MED ORDER — HYDRALAZINE HCL 20 MG/ML IJ SOLN: 5.0000 mg | Freq: Four times a day (QID) | INTRAMUSCULAR | Status: DC | PRN

## 2022-05-27 MED ORDER — ONDANSETRON HCL 4 MG PO TABS: 4.0000 mg | ORAL_TABLET | Freq: Four times a day (QID) | ORAL | Status: DC | PRN

## 2022-05-27 MED ORDER — SENNOSIDES-DOCUSATE SODIUM 8.6-50 MG PO TABS: 1.0000 | ORAL_TABLET | Freq: Every evening | ORAL | Status: DC | PRN

## 2022-05-27 MED ORDER — ACETAMINOPHEN 325 MG PO TABS: 650.0000 mg | ORAL_TABLET | Freq: Four times a day (QID) | ORAL | Status: DC | PRN

## 2022-05-27 MED ORDER — ONDANSETRON HCL 4 MG/2ML IJ SOLN: 4.0000 mg | Freq: Four times a day (QID) | INTRAMUSCULAR | Status: DC | PRN

## 2022-05-27 NOTE — ED Triage Notes (Signed)
Pt in via EMS from [facility] wit c/o syncopal episode. EMS reports pt reported she felt light headed and staff helped her to the floor. Pt also vomited x's 1. 106/82, post syncope, 142/67 en route, 78HR 98% RA, CBG 142. Pt also has colostomy bag and has blood in it for the past month her PMD is aware.

## 2022-05-27 NOTE — Assessment & Plan Note (Addendum)
Secondary to symptomatic anemia from ongoing hematuria Not having any further syncopal/dizzy spell

## 2022-05-27 NOTE — Assessment & Plan Note (Addendum)
Renal function at baseline 

## 2022-05-27 NOTE — ED Notes (Signed)
Pt resting at this time with son at bedside.  Denies pain, N/V, states that she feels back to normal and is tired because she missed her afternoon nap.

## 2022-05-27 NOTE — ED Triage Notes (Signed)
Patient arrived by EMS from [facility] where she had syncopal episode. Patient reports her son caught her before hitting the floor. Emesis X1 after syncopal episode. Patient reports when she became alert her son fed her chicken salad when he found out she had not eaten today.   Patient has urostomy bag that has been bloody for the past month and reports her doctor knows. Reports she tested positive for UTI and finished course of antibiotics.

## 2022-05-27 NOTE — ED Provider Notes (Signed)
[facility] Provider Note    None    (approximate)  History   Chief Complaint: Emesis and Loss of Consciousness  HPI  Sara Aguirre is a 81 y.o. female with a past medical history of bladder cancer status post urostomy, hypertension, hyperlipidemia, presents to the emergency department for syncopal event and hematuria.  According to the patient she was at [employer] today when she began feeling weak and lightheaded had a brief syncopal event.  Did not fall to the ground.  Did not hit her head.  Patient takes aspirin and Plavix.  Patient also states for the past month she has been experiencing blood in her urostomy bag.  Patient followed up with Dr. Bernardo Heater is supposed to have a CT scan of her abdomen/pelvis next week.  Patient also states she recently tested positive for urinary tract infection but has since completed a course of antibiotics.  Patient states she had not eaten last night or today yet.  Denies any chest pain at any point.  No shortness of breath.  No fever.  Denies any chest pain or abdominal pain.  No vomiting diarrhea cough or congestion.  Physical Exam   Triage Vital Signs: ED Triage Vitals [05/27/22 1715]  Enc Vitals Group     BP (!) 155/78     Pulse Rate 84     Resp 16     Temp 98 F (36.7 C)     Temp Source Oral     SpO2 100 %     Weight 136 lb (61.7 kg)     Height '5\' 8"'$  (1.727 m)     Head Circumference      Peak Flow      Pain Score 0     Pain Loc      Pain Edu?      Excl. in [place]?     Most recent vital signs: Vitals:   05/27/22 1715  BP: (!) 155/78  Pulse: 84  Resp: 16  Temp: 98 F (36.7 C)  SpO2: 100%    General: Awake, no distress.  CV:  Good peripheral perfusion.  Regular rate and rhythm  Resp:  Normal effort.  Equal breath sounds bilaterally.  Abd:  No distention.  Soft, nontender.  No rebound or guarding.  Patient does have frank blood in her urostomy bag.    ED Results / Procedures / Treatments   EKG  EKG  viewed and interpreted by myself shows a normal sinus rhythm at 81 bpm with a narrow QRS, normal axis, normal intervals, nonspecific ST changes.  No ST elevation.  RADIOLOGY  Reviewed the CT images patient appears to have a lot of constipation but no other acute abnormality on my evaluation. Radiology is read the CT is largely negative besides constipation.   MEDICATIONS ORDERED IN ED: Medications - No data to display   IMPRESSION / MDM / [place] / ED COURSE  I reviewed the triage vital signs and the nursing notes.  Patient's presentation is most consistent with acute presentation with potential threat to life or bodily function.  Patient presents to the emergency department for syncopal episode earlier today.  Patient states for the past month or so she has been experiencing blood out of her urostomy bag.  Was initially diagnosed with urinary tract infection but is since finished the course of antibiotics and continues to have hematuria.  Patient followed up with Dr. Bernardo Heater and had a CT planned for next week.  We will proceed with CT scan abdomen/pelvis today.  Patient's lab work shows a fairly elevated white blood cell count of 17,000 hemoglobin of 9.8 down from 13.41-monthago.  Chemistry shows renal insufficiency largely unchanged from historical values.  Patient's work-up shows three-point hemoglobin drop, and urinary tract infection on urinalysis.  Chemistry largely unchanged.  Given the UTI and urinalysis we will start the patient on Rocephin, send urine culture.  We will continue to closely monitor the patient given the hemoglobin drop we will admit to the hospital service for ongoing management and treatment.  I spoke to Dr. SBernardo Heaterof urology who will see in consultation.  FINAL CLINICAL IMPRESSION(S) / ED DIAGNOSES   Hematuria Syncope Urinary tract infection   Note:  This document was prepared using Dragon voice recognition software and may include unintentional  dictation errors.   PHarvest Dark MD 05/27/22 [phone]

## 2022-05-27 NOTE — Assessment & Plan Note (Addendum)
Started on IV Rocephin.  Urine cultures are pending.  Recheck white blood cell count in the morning.

## 2022-05-27 NOTE — Assessment & Plan Note (Signed)
Will hold home hydralazine and amlodipine and losartan IV hydralazine as needed for blood pressure while n.p.o.

## 2022-05-27 NOTE — Assessment & Plan Note (Addendum)
Acute versus chronic blood loss from urostomy History of bladder cancer s/p cystectomy with ileal conduit Hold aspirin and Plavix.  Transfuse if greater than two-point drop in 6 hours or if hemoglobin under 7.  Urology plans for loopogram on 6/12.

## 2022-05-27 NOTE — Assessment & Plan Note (Signed)
Holding aspirin and Plavix.  Continue rosuvastatin

## 2022-05-27 NOTE — H&P (Signed)
History and Physical    Patient: Sara Aguirre VOJ:[id number] DOB: [birth year] DOA: 05/27/2022 DOS: the patient was seen and examined on 05/27/2022 PCP: Pleas Koch, NP  Patient coming from: Home  Chief Complaint:  Chief Complaint  Patient presents with   Emesis   Loss of Consciousness    HPI: Sara Aguirre is a 81 y.o. female with medical history significant for For bladder cancer s/p cystectomy with ileal conduit, CVA s/p right ICA stent on aspirin and Plavix, HTN, CKD stage IIIb, who has been having beet colored urostomy output for the past six weeks, seen by urologist, Dr. Bernardo Heater on 6/7 with plans for an outpatient loopogram and CT, who was brought to the ED after a syncopal event while at [employer].  Patient lost consciousness while walking [employer] but was helped to the ground by her son and did not suffer any injury.  She came around soon thereafter and has been with normal mentation since.  Patient has had no nausea, vomiting, black or bloody stool.  There is no visual disturbance, slurred speech facial droop one-sided weakness numbness or tingling or headache. Denies chest pain, palpitations or shortness of breath. ED course and data review: On arrival BP 155/78 with otherwise normal vitals.  Labs significant for hemoglobin 9.8 down from 13.5 baseline a month prior.  WBC 18,000.  Creatinine at baseline at 1.52.  Urinalysis showing blood and many bacteria, but interpretation hindered by presence of pigment.  EKG, personally viewed and interpreted shows normal sinus rhythm at 81 with nonspecific ST-T wave changes.  CT abdomen and pelvis shows the following: 1. No acute noncontrast CT findings of the abdomen or pelvis. 2. Status post cystectomy with right lower quadrant ileal conduit urinary diversion. Severe bilateral hydronephrosis and hydroureter to the ileal conduit, appearance and configuration not significantly changed compared to prior examination dated 2012. Extensive,  lobular renal cortical scarring. 3. Large burden of stool in the distal colon and rectum, rectal stool ball measuring at least 10.0 cm. Correlate for fecal impaction. 4. Coronary artery disease.  Patient started on Rocephin.  The ED provider spoke with urologist Dr. Bernardo Heater who will consider taking patient for procedure in the a.m.  Hospitalist consulted for admission.   Review of Systems: As mentioned in the history of present illness. All other systems reviewed and are negative.  Past Medical History:  Diagnosis Date   Cancer [facility])    bladder, s/p cystectomy with ileal conduit   History of bladder cancer    History of tobacco use    Hyperlipidemia    Hypertension    Incarcerated ventral hernia, s/p lap repair 12/05/2011   Parastomal hernia of ileal conduit, s/p lap repair [id number] 12/04/2011   Ulcer, gastric, acute or chronic    Past Surgical History:  Procedure Laterality Date   BLADDER REMOVAL  10/1993   Dr. Risa Grill   CAROTID PTA/STENT INTERVENTION Right 11/08/2021   Procedure: CAROTID PTA/STENT INTERVENTION;  Surgeon: Katha Cabal, MD;  Location: [place] [facility];  Service: Cardiovascular;  Laterality: Right;   HERNIA REPAIR  12/05/11   parastomal   LOOP RECORDER INSERTION N/A 04/23/2020   Procedure: LOOP RECORDER INSERTION;  Surgeon: Deboraha Sprang, MD;  Location: [place] [facility];  Service: Cardiovascular;  Laterality: N/A;   PARASTOMAL HERNIA REPAIR  12/05/2011   Procedure: HERNIA REPAIR PARASTOMAL;  Surgeon: Adin Hector, MD;  Location: [facility];  Service: General;;  Laprascopic lysis of adhestons with reduction and repair  with biological mesh for incarcerated parastomal and ventral long incisional hernia.   REVISION UROSTOMY CUTANEOUS     VENTRAL HERNIA REPAIR  12/05/2011   Procedure: HERNIA REPAIR VENTRAL ADULT;  Surgeon: Adin Hector, MD;  Location: [facility];  Service: General;;   Social History:  reports that she quit smoking about 29 years ago. Her  smoking use included cigarettes. She has never used smokeless tobacco. She reports that she does not drink alcohol and does not use drugs.  Allergies  Allergen Reactions   Imodium [Loperamide]     Itchy all over   Other     Also allergic to a "cough medicine" but patient does not remember the name   Loperamide Hcl Rash    Family History  Problem Relation Age of Onset   Heart disease Mother 31       CHF   Alcohol abuse Father    Stroke Father     Prior to Admission medications   Medication Sig Start Date End Date Taking? Authorizing Provider  amLODipine (NORVASC) 10 MG tablet Take 1 tablet (10 mg total) by mouth daily. For blood pressure 10/27/21   Pleas Koch, NP  aspirin EC 81 MG tablet Take 81 mg by mouth daily. Swallow whole.    [provider]  Blood Pressure Monitor DEVI Use to check blood pressure 3 times a week 10/27/19   Delia Chimes A, MD  cephALEXin (KEFLEX) 500 MG capsule Take 1 capsule (500 mg total) by mouth 2 (two) times daily. 04/19/22   Tower, Wynelle Fanny, MD  clopidogrel (PLAVIX) 75 MG tablet Take 1 tablet (75 mg total) by mouth daily. For stroke 08/12/21   Pleas Koch, NP  cyanocobalamin 1000 MCG tablet Take 1,000 mcg by mouth daily.    [provider]  dapagliflozin propanediol (FARXIGA) 10 MG TABS tablet Take by mouth. 02/09/22 02/09/23  [provider]  losartan (COZAAR) 100 MG tablet Take 100 mg by mouth daily. 09/27/21   [provider]  rosuvastatin (CRESTOR) 5 MG tablet Take 1 tablet (5 mg total) by mouth daily. For cholesterol. 08/13/21 08/13/22  Pleas Koch, NP  UNABLE TO FIND HOLISTER UROSTOMY POUCHES ITEM [phone] 10/31/18   Forrest Moron, MD    Physical Exam: Vitals:   05/27/22 1715 05/27/22 1855 05/27/22 1919 05/27/22 2023  BP: (!) 155/78 (!) 132/58  (!) 141/58  Pulse: 84 75  67  Resp: '16 15  20  '$ Temp: 98 F (36.7 C)     TempSrc: Oral     SpO2: 100% 97% 93% 94%  Weight: 61.7 kg     Height: '5\' 8"'$   (1.727 m)      Physical Exam Vitals and nursing note reviewed.  Constitutional:      General: She is not in acute distress. HENT:     Head: Normocephalic and atraumatic.  Cardiovascular:     Rate and Rhythm: Normal rate and regular rhythm.     Heart sounds: Normal heart sounds.  Pulmonary:     Effort: Pulmonary effort is normal.     Breath sounds: Normal breath sounds.  Abdominal:     Palpations: Abdomen is soft.     Tenderness: There is no abdominal tenderness.     Comments: Urostomy with dark red output  Neurological:     Mental Status: Mental status is at baseline.     Labs on Admission: I have personally reviewed following labs and imaging studies  CBC: Recent Labs  Lab  05/27/22 1725  WBC 17.9*  HGB 9.8*  HCT 31.7*  MCV 90.8  PLT 536   Basic Metabolic Panel: Recent Labs  Lab 05/27/22 1725  NA 135  K 3.4*  CL 104  CO2 24  GLUCOSE 153*  BUN 23  CREATININE 1.52*  CALCIUM 8.8*   GFR: Estimated Creatinine Clearance: 27.3 mL/min (A) (by C-G formula based on SCr of 1.52 mg/dL (H)). Liver Function Tests: No results for input(s): "AST", "ALT", "ALKPHOS", "BILITOT", "PROT", "ALBUMIN" in the last 168 hours. No results for input(s): "LIPASE", "AMYLASE" in the last 168 hours. No results for input(s): "AMMONIA" in the last 168 hours. Coagulation Profile: No results for input(s): "INR", "PROTIME" in the last 168 hours. Cardiac Enzymes: No results for input(s): "CKTOTAL", "CKMB", "CKMBINDEX", "TROPONINI" in the last 168 hours. BNP (last 3 results) No results for input(s): "PROBNP" in the last 8760 hours. HbA1C: No results for input(s): "HGBA1C" in the last 72 hours. CBG: No results for input(s): "GLUCAP" in the last 168 hours. Lipid Profile: No results for input(s): "CHOL", "HDL", "LDLCALC", "TRIG", "CHOLHDL", "LDLDIRECT" in the last 72 hours. Thyroid Function Tests: No results for input(s): "TSH", "T4TOTAL", "FREET4", "T3FREE", "THYROIDAB" in the last 72  hours. Anemia Panel: No results for input(s): "VITAMINB12", "FOLATE", "FERRITIN", "TIBC", "IRON", "RETICCTPCT" in the last 72 hours. Urine analysis:    Component Value Date/Time   COLORURINE (A) 05/27/2022 1759    TEST NOT REPORTED DUE TO COLOR INTERFERENCE OF URINE PIGMENT   APPEARANCEUR (A) 05/27/2022 1759    TEST NOT REPORTED DUE TO COLOR INTERFERENCE OF URINE PIGMENT   APPEARANCEUR Cloudy (A) 05/24/2022 1326   LABSPEC 1.025 05/27/2022 1759   PHURINE  05/27/2022 1759    TEST NOT REPORTED DUE TO COLOR INTERFERENCE OF URINE PIGMENT   GLUCOSEU (A) 05/27/2022 1759    TEST NOT REPORTED DUE TO COLOR INTERFERENCE OF URINE PIGMENT   HGBUR (A) 05/27/2022 1759    TEST NOT REPORTED DUE TO COLOR INTERFERENCE OF URINE PIGMENT   BILIRUBINUR (A) 05/27/2022 1759    TEST NOT REPORTED DUE TO COLOR INTERFERENCE OF URINE PIGMENT   BILIRUBINUR Negative 05/24/2022 1326   KETONESUR (A) 05/27/2022 1759    TEST NOT REPORTED DUE TO COLOR INTERFERENCE OF URINE PIGMENT   PROTEINUR (A) 05/27/2022 1759    TEST NOT REPORTED DUE TO COLOR INTERFERENCE OF URINE PIGMENT   UROBILINOGEN 2.0 (A) 04/19/2022 1036   UROBILINOGEN 0.2 12/13/2012 2155   NITRITE (A) 05/27/2022 1759    TEST NOT REPORTED DUE TO COLOR INTERFERENCE OF URINE PIGMENT   LEUKOCYTESUR (A) 05/27/2022 1759    TEST NOT REPORTED DUE TO COLOR INTERFERENCE OF URINE PIGMENT    Radiological Exams on Admission: CT ABDOMEN PELVIS WO CONTRAST  Result Date: 05/27/2022 CLINICAL DATA:  Syncope, flank pain, vomiting, history of bladder cancer status post cystectomy and ileal conduit urinary diversion EXAM: CT ABDOMEN AND PELVIS WITHOUT CONTRAST TECHNIQUE: Multidetector CT imaging of the abdomen and pelvis was performed following the standard protocol without IV contrast. RADIATION DOSE REDUCTION: This exam was performed according to the departmental dose-optimization program which includes automated exposure control, adjustment of the mA and/or kV according to  patient size and/or use of iterative reconstruction technique. COMPARISON:  12/04/2011 FINDINGS: Lower chest: No acute abnormality.  Coronary artery calcifications. Hepatobiliary: No solid liver abnormality is seen. No gallstones, gallbladder wall thickening, or biliary dilatation. Pancreas: Unremarkable. No pancreatic ductal dilatation or surrounding inflammatory changes. Spleen: Normal in size without significant abnormality. Adrenals/Urinary Tract: Adrenal glands  are unremarkable. Status post cystectomy with right lower quadrant ileal conduit urinary diversion. Severe bilateral hydronephrosis and hydroureter to the ileal conduit, appearance and configuration not significant changed compared to prior examination dated 2012. Extensive, lobular renal cortical scarring. Stomach/Bowel: Stomach is within normal limits. Appendix appears normal. No evidence of bowel wall thickening, distention, or inflammatory changes. Large burden of stool in the distal colon and rectum, rectal stool ball measuring at least 10.0 cm (series 6, image 61). Vascular/Lymphatic: Aortic atherosclerosis. No enlarged abdominal or pelvic lymph nodes. Reproductive: Status post hysterectomy. Other: Right lower quadrant ileal conduit urinary diversion. Previously seen parastomal hernia is reduced (series 2, image 32). No ascites. Musculoskeletal: No acute or significant osseous findings. IMPRESSION: 1. No acute noncontrast CT findings of the abdomen or pelvis. 2. Status post cystectomy with right lower quadrant ileal conduit urinary diversion. Severe bilateral hydronephrosis and hydroureter to the ileal conduit, appearance and configuration not significantly changed compared to prior examination dated 2012. Extensive, lobular renal cortical scarring. 3. Large burden of stool in the distal colon and rectum, rectal stool ball measuring at least 10.0 cm. Correlate for fecal impaction. 4. Coronary artery disease. Aortic Atherosclerosis (ICD10-I70.0).  Electronically Signed   By: Delanna Ahmadi M.D.   On: 05/27/2022 19:07     Data Reviewed: Relevant notes from primary care and specialist visits, past discharge summaries as available in EHR, including Care Everywhere. Prior diagnostic testing as pertinent to current admission diagnoses Updated medications and problem lists for reconciliation ED course, including vitals, labs, imaging, treatment and response to treatment Triage notes, nursing and pharmacy notes and ED provider's notes Notable results as noted in HPI   Assessment and Plan: * Syncope and collapse Secondary to symptomatic anemia from ongoing hematuria IV hydration Treat blood loss anemia as outlined below Given history of stroke with history of carotid artery stent, will place on continuous cardiac monitoring and get carotid Dopplers and echo Neurologic checks  Hematuria with ABLA (acute blood loss anemia) Acute versus chronic blood loss from urostomy History of bladder cancer s/p cystectomy with ileal conduit Hold aspirin and Plavix Serial H&H Type cross and transfuse if greater than two-point drop in 6 hours or if hemoglobin under 7 Urology consult seen by urologist, Dr. Bernardo Heater on 6/7 with plans for an outpatient loopogram  We will keep n.p.o. overnight in case of procedure SCD for DVT prophylaxis  Urinary tract infection Rocephin and follow cultures  Constipation on CT CT abdomen and pelvis showed large burden of stool in distal colon and rectum with rectal stool ball Patient denies constipation and states she has regular bowel movements  CKD (chronic kidney disease), stage IIIb Renal function at baseline  History of CVA s/p R ICA stent(cerebrovascular accident) Holding aspirin and Plavix.  Continue rosuvastatin  Hypertension Will hold home hydralazine and amlodipine and losartan IV hydralazine as needed for blood pressure while n.p.o.        DVT prophylaxis: SCDs  Consults: Urology, Dr.  Bernardo Heater  Advance Care Planning:   Code Status: Full Code   Family Communication: none  Disposition Plan: Back to previous home environment  Severity of Illness: The appropriate patient status for this patient is INPATIENT. Inpatient status is judged to be reasonable and necessary in order to provide the required intensity of service to ensure the patient's safety. The patient's presenting symptoms, physical exam findings, and initial radiographic and laboratory data in the context of their chronic comorbidities is felt to place them at high risk for further clinical  deterioration. Furthermore, it is not anticipated that the patient will be medically stable for discharge from the hospital within 2 midnights of admission.   * I certify that at the point of admission it is my clinical judgment that the patient will require inpatient hospital care spanning beyond 2 midnights from the point of admission due to high intensity of service, high risk for further deterioration and high frequency of surveillance required.*  Author: Athena Masse, MD 05/27/2022 8:59 PM  For on call review [email or web address].

## 2022-05-27 NOTE — Assessment & Plan Note (Addendum)
CT abdomen and pelvis showed large burden of stool in distal colon and rectum with rectal stool ball.  Will give additional medications to clean her out.s

## 2022-05-27 NOTE — ED Notes (Signed)
Patient transported to CT 

## 2022-05-28 ENCOUNTER — Inpatient Hospital Stay
Admit: 2022-05-28 | Discharge: 2022-05-28 | Disposition: A | Discharge status: Home / Self Care | Payer: PPO | Attending: Internal Medicine | Admitting: Internal Medicine

## 2022-05-28 DIAGNOSIS — D62 Acute posthemorrhagic anemia: Secondary | ICD-10-CM

## 2022-05-28 DIAGNOSIS — K5900 Constipation, unspecified: Secondary | ICD-10-CM | POA: Diagnosis not present

## 2022-05-28 DIAGNOSIS — R55 Syncope and collapse: Secondary | ICD-10-CM | POA: Diagnosis not present

## 2022-05-28 DIAGNOSIS — N1832 Chronic kidney disease, stage 3b: Secondary | ICD-10-CM

## 2022-05-28 DIAGNOSIS — R319 Hematuria, unspecified: Secondary | ICD-10-CM

## 2022-05-28 DIAGNOSIS — R310 Gross hematuria: Secondary | ICD-10-CM

## 2022-05-28 LAB — ECHOCARDIOGRAM COMPLETE
AR max vel: 2.14 cm2
AV Peak grad: 10.5 mmHg
Ao pk vel: 1.62 m/s
Area-P 1/2: 3.46 cm2
Height: 68 in
S' Lateral: 3.11 cm
Single Plane A4C EF: 62.2 %
Weight: 2176 oz

## 2022-05-28 LAB — URINALYSIS, ROUTINE W REFLEX MICROSCOPIC
RBC / HPF: >50 RBC/hpf — ABNORMAL HIGH (ref 0–5)
Specific Gravity, Urine: 1.025 (ref 1.005–1.030)
Squamous Epithelial / HPF: NONE SEEN (ref 0–5)
WBC, UA: >50 WBC/hpf — ABNORMAL HIGH (ref 0–5)

## 2022-05-28 LAB — HEMOGLOBIN: Hemoglobin: 9 g/dL — ABNORMAL LOW (ref 12.0–15.0)

## 2022-05-28 LAB — TROPONIN I (HIGH SENSITIVITY)
Troponin I (High Sensitivity): 8 ng/L (ref <18)
Troponin I (High Sensitivity): 8 ng/L (ref <18)

## 2022-05-28 MED ORDER — POLYETHYLENE GLYCOL 3350 17 G PO PACK
17.0000 g | PACK | Freq: Every day | ORAL | Status: DC
  Administered 2022-05-28 – 2022-05-31 (×4): 17 g via ORAL
  Filled 2022-05-28 (×4): qty 1

## 2022-05-28 MED ORDER — DOCUSATE SODIUM 100 MG PO CAPS
100.0000 mg | ORAL_CAPSULE | Freq: Two times a day (BID) | ORAL | Status: DC
  Administered 2022-05-28 – 2022-05-31 (×6): 100 mg via ORAL
  Filled 2022-05-28 (×6): qty 1

## 2022-05-28 MED ORDER — SODIUM CHLORIDE 0.9 % IV SOLN
1.0000 g | INTRAVENOUS | Status: DC
  Administered 2022-05-28 – 2022-05-29 (×2): 1 g via INTRAVENOUS
  Filled 2022-05-28 (×4): qty 10

## 2022-05-28 NOTE — Progress Notes (Signed)
*  PRELIMINARY RESULTS* Echocardiogram 2D Echocardiogram has been performed.  Sara Aguirre 05/28/2022, 12:45 PM

## 2022-05-28 NOTE — Hospital Course (Addendum)
81 year old female with past medical history of bladder cancer status post cystectomy with ileal conduit, CVA status post right ICA stent on aspirin plus Plavix, stage IIIb chronic kidney disease and hypertension who for the past 6 weeks has been during bright red urostomy output and has been seen by urology with plans for outpatient loopogram and CT.  However, patient was brought into the emergency room on 6/10 after a syncopal event while grocery shopping.  In emergency room, hemoglobin down to 9.8 (had been 13.87-monthprior) along with a white blood cell count of 18 as well as a UTI with hematuria.    6/12 loopogram not showing any stricture.  Unable to account for gross hematuria.  CT urogram done on 6/13 noted evidence of clots in the right kidney and 2 upper pole renal lesions in the left kidney.  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  Given that bleeding was stable, patient was discharged [facility]

## 2022-05-28 NOTE — Consult Note (Signed)
Urology Consult  Requesting physician: Judd Gaudier, MD  Reason consultation: Hematuria  Chief Complaint: Blood in urostomy  History of Present Illness: Sara Aguirre is a 81 y.o. female seen in the office on 05/24/2022 for gross hematuria since early May 2023.  She is status post radical cystectomy with ileal conduit performed in [place] in 1994.  Her last urologic follow-up was in 1995.  She has had no flank, abdominal or pelvic pain.  Outpatient evaluation with CT abdomen pelvis and loopogram was recommended however she had a syncopal episode yesterday and was transported to the ED by EMS.  Hemoglobin was 9.8 and 13.3 04/19/2022.  Noncontrast CT abdomen/pelvis was performed on admission which showed bilateral hydronephrosis/hydroureter to the ileal conduit not significantly changed from a prior CT in 2012.  Renal cortical scarring was noted.  She did have parastomal/ventral hernia repair and urostomy revision December 2012.  On Plavix for cerebrovascular disease  Past Medical History:  Diagnosis Date   Cancer ([place])    bladder, s/p cystectomy with ileal conduit   History of bladder cancer    History of tobacco use    Hyperlipidemia    Hypertension    Incarcerated ventral hernia, s/p lap repair 12/05/2011   Parastomal hernia of ileal conduit, s/p lap repair [id number] 12/04/2011   Ulcer, gastric, acute or chronic     Past Surgical History:  Procedure Laterality Date   BLADDER REMOVAL  10/1993   Dr. Risa Grill   CAROTID PTA/STENT INTERVENTION Right 11/08/2021   Procedure: CAROTID PTA/STENT INTERVENTION;  Surgeon: Katha Cabal, MD;  Location: [facility];  Service: Cardiovascular;  Laterality: Right;   HERNIA REPAIR  12/05/11   parastomal   LOOP RECORDER INSERTION N/A 04/23/2020   Procedure: LOOP RECORDER INSERTION;  Surgeon: Deboraha Sprang, MD;  Location: [facility];  Service: Cardiovascular;  Laterality: N/A;   PARASTOMAL HERNIA REPAIR  12/05/2011    Procedure: HERNIA REPAIR PARASTOMAL;  Surgeon: Adin Hector, MD;  Location: [facility];  Service: General;;  Laprascopic lysis of adhestons with reduction and repair with biological mesh for incarcerated parastomal and ventral long incisional hernia.   REVISION UROSTOMY CUTANEOUS     VENTRAL HERNIA REPAIR  12/05/2011   Procedure: HERNIA REPAIR VENTRAL ADULT;  Surgeon: Adin Hector, MD;  Location: [facility];  Service: General;;    Home Medications:  No outpatient medications have been marked as taking for the 05/27/22 encounter [facility] Encounter).    Allergies:  Allergies  Allergen Reactions   Imodium [Loperamide]     Itchy all over   Other     Also allergic to a "cough medicine" but patient does not remember the name   Loperamide Hcl Rash    Family History  Problem Relation Age of Onset   Heart disease Mother 59       CHF   Alcohol abuse Father    Stroke Father     Social History:  reports that she quit smoking about 29 years ago. Her smoking use included cigarettes. She has never used smokeless tobacco. She reports that she does not drink alcohol and does not use drugs.  ROS: A complete review of systems was performed.  All systems are negative except for pertinent findings as noted.  Physical Exam:  Vital signs in last 24 hours: Temp:  [97.7 F (36.5 C)-98.9 F (37.2 C)] 98.9 F (37.2 C) (06/11 0752) Pulse Rate:  [67-84] 75 (06/11 0752) Resp:  [15-20] 18 (06/11 0752)  BP: (129-155)/(55-78) 129/55 (06/11 0752) SpO2:  [93 %-100 %] 97 % (06/11 0752) Weight:  [61.7 kg] 61.7 kg (06/10 1715) Constitutional:  Alert and oriented, No acute distress HEENT: [place] AT, moist mucus membranes.  Trachea midline, no masses Respiratory: Normal respiratory effort GI: Abdomen is soft, nontender, nondistended, no abdominal masses GU: Red urine urostomy bag Skin: No rashes, bruises or suspicious lesions Lymph: No cervical or inguinal adenopathy Neurologic: Grossly intact, no focal  deficits, moving all 4 extremities Psychiatric: Normal mood and affect   Laboratory Data:  Recent Labs    05/27/22 1725 05/27/22 2322 05/28/22 0534  WBC 17.9*  --   --   HGB 9.8* 9.5* 9.0*  HCT 31.7*  --   --    Recent Labs    05/27/22 1725  NA 135  K 3.4*  CL 104  CO2 24  GLUCOSE 153*  BUN 23  CREATININE 1.52*  CALCIUM 8.8*   No results for input(s): "LABPT", "INR" in the last 72 hours. No results for input(s): "LABURIN" in the last 72 hours. Results for orders placed or performed in visit on 05/24/22  Microscopic Examination     Status: Abnormal   Collection Time: 05/24/22  1:26 PM   Urine  Result Value Ref Range Status   WBC, UA >30 (A) 0 - 5 /hpf Final   RBC >30 (A) 0 - 2 /hpf Final   Epithelial Cells (non renal) 0-10 0 - 10 /hpf Final   Bacteria, UA Many (A) None seen/Few Final     Radiologic Imaging: CT images were personally reviewed and interpreted  US Carotid Bilateral  Result Date: 05/27/2022 CLINICAL DATA:  Syncope EXAM: BILATERAL CAROTID DUPLEX ULTRASOUND TECHNIQUE: Pearline Cables scale imaging, color Doppler and duplex ultrasound were performed of bilateral carotid and vertebral arteries in the neck. COMPARISON:  None Available. FINDINGS: Criteria: Quantification of carotid stenosis is based on velocity parameters that correlate the residual internal carotid diameter with NASCET-based stenosis levels, using the diameter of the distal internal carotid lumen as the denominator for stenosis measurement. The following velocity measurements were obtained: RIGHT ICA: 91/26 cm/sec CCA: 24/23 cm/sec SYSTOLIC ICA/CCA RATIO:  2 ECA:  173 cm/sec LEFT ICA: 100/14 cm/sec CCA: 53/6 cm/sec SYSTOLIC ICA/CCA RATIO:  2.1 ECA:  88 cm/sec RIGHT CAROTID ARTERY: Trace heterogeneous atherosclerotic plaque in the proximal internal carotid artery. By peak systolic velocity criteria there is no significant stenosis. RIGHT VERTEBRAL ARTERY:  Patent with antegrade flow. LEFT CAROTID ARTERY: Trace  smooth heterogeneous atherosclerotic plaque. By peak systolic velocity criteria, the estimated stenosis is less than 50%. LEFT VERTEBRAL ARTERY:  Patent with normal antegrade flow. IMPRESSION: 1. Mild (1-49%) stenosis proximal right internal carotid artery secondary to mild smooth heterogeneous atherosclerotic plaque. 2. Mild (1-49%) stenosis proximal left internal carotid artery secondary to mild smooth heterogeneous atherosclerotic plaque. 3. Vertebral arteries are patent with normal antegrade flow. Electronically Signed   By: Jacqulynn Cadet M.D.   On: 05/27/2022 23:03   CT HEAD WO CONTRAST (5MM)  Result Date: 05/27/2022 CLINICAL DATA:  Syncope/presyncope, cerebrovascular cause suspected EXAM: CT HEAD WITHOUT CONTRAST TECHNIQUE: Contiguous axial images were obtained from the base of the skull through the vertex without intravenous contrast. RADIATION DOSE REDUCTION: This exam was performed according to the departmental dose-optimization program which includes automated exposure control, adjustment of the mA and/or kV according to patient size and/or use of iterative reconstruction technique. 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 There is atrophy and chronic small  vessel disease changes. No acute intracranial abnormality. Specifically, no hemorrhage, hydrocephalus, mass lesion, acute infarction, or significant intracranial injury. Vascular: No hyperdense vessel or unexpected calcification. Skull: No acute calvarial abnormality. Sinuses/Orbits: No acute findings Other: None IMPRESSION: Old right posterior parietal infarct. Atrophy, chronic microvascular disease. No acute intracranial abnormality. Electronically Signed   By: Rolm Baptise M.D.   On: 05/27/2022 22:59   CT ABDOMEN PELVIS WO CONTRAST  Result Date: 05/27/2022 CLINICAL DATA:  Syncope, flank pain, vomiting, history of bladder cancer status post cystectomy and ileal conduit urinary  diversion EXAM: CT ABDOMEN AND PELVIS WITHOUT CONTRAST TECHNIQUE: Multidetector CT imaging of the abdomen and pelvis was performed following the standard protocol without IV contrast. RADIATION DOSE REDUCTION: This exam was performed according to the departmental dose-optimization program which includes automated exposure control, adjustment of the mA and/or kV according to patient size and/or use of iterative reconstruction technique. COMPARISON:  12/04/2011 FINDINGS: Lower chest: No acute abnormality.  Coronary artery calcifications. Hepatobiliary: No solid liver abnormality is seen. No gallstones, gallbladder wall thickening, or biliary dilatation. Pancreas: Unremarkable. No pancreatic ductal dilatation or surrounding inflammatory changes. Spleen: Normal in size without significant abnormality. Adrenals/Urinary Tract: Adrenal glands are unremarkable. Status post cystectomy with right lower quadrant ileal conduit urinary diversion. Severe bilateral hydronephrosis and hydroureter to the ileal conduit, appearance and configuration not significant changed compared to prior examination dated 2012. Extensive, lobular renal cortical scarring. Stomach/Bowel: Stomach is within normal limits. Appendix appears normal. No evidence of bowel wall thickening, distention, or inflammatory changes. Large burden of stool in the distal colon and rectum, rectal stool ball measuring at least 10.0 cm (series 6, image 61). Vascular/Lymphatic: Aortic atherosclerosis. No enlarged abdominal or pelvic lymph nodes. Reproductive: Status post hysterectomy. Other: Right lower quadrant ileal conduit urinary diversion. Previously seen parastomal hernia is reduced (series 2, image 32). No ascites. Musculoskeletal: No acute or significant osseous findings. IMPRESSION: 1. No acute noncontrast CT findings of the abdomen or pelvis. 2. Status post cystectomy with right lower quadrant ileal conduit urinary diversion. Severe bilateral hydronephrosis and  hydroureter to the ileal conduit, appearance and configuration not significantly changed compared to prior examination dated 2012. Extensive, lobular renal cortical scarring. 3. Large burden of stool in the distal colon and rectum, rectal stool ball measuring at least 10.0 cm. Correlate for fecal impaction. 4. Coronary artery disease. Aortic Atherosclerosis (ICD10-I70.0). Electronically Signed   By: Delanna Ahmadi M.D.   On: 05/27/2022 19:07    Impression/Assessment:   1.  Gross hematuria Undetermined etiology.  No calculi or renal masses  2.  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

## 2022-05-28 NOTE — Progress Notes (Signed)
[facility] Progress Note  Patient: Sara Aguirre    SWF:[id number]  DOA: 05/27/2022    Date of Service: the patient was seen and examined on 05/28/2022  Brief hospital course: 81 year old female with past medical history of bladder cancer status post cystectomy with ileal conduit, CVA status post right ICA stent on aspirin plus Plavix, stage IIIb chronic kidney disease and hypertension who for the past 6 weeks has been during bright red urostomy output and has been seen by urology with plans for outpatient loopogram and CT.  However, patient was brought into the emergency room on 6/10 after a syncopal event while grocery shopping.  In emergency room, hemoglobin down to 9.8 (had been 13.56-monthprior) along with a white blood cell count of 18 as well as a UTI with hematuria.  Brought into the hospitalist service and urology plans to take patient for loopogram on 6/12.  Assessment and Plan: Assessment and Plan: * Syncope and collapse Secondary to symptomatic anemia from ongoing hematuria IV hydration.  Hematuria with ABLA (acute blood loss anemia) Acute versus chronic blood loss from urostomy History of bladder cancer s/p cystectomy with ileal conduit Hold aspirin and Plavix.  Transfuse if greater than two-point drop in 6 hours or if hemoglobin under 7.  Urology plans for loopogram on 6/12.  Urinary tract infection Started on IV Rocephin.  Urine cultures are pending.  Recheck white blood cell count in the morning.  CKD (chronic kidney disease), stage IIIb Renal function at baseline  Hypertension Will hold home hydralazine and amlodipine and losartan IV hydralazine as needed for blood pressure while n.p.o.  Constipation on CT CT abdomen and pelvis showed large burden of stool in distal colon and rectum with rectal stool ball.  Will give additional medications to clean her out.s  History of CVA s/p R ICA stent(cerebrovascular accident) Holding aspirin and Plavix.  Continue  rosuvastatin       Body mass index is 20.68 kg/m.        Consultants: Urology  Procedures: Planned loopogram 6/12  Antimicrobials: IV Rocephin 6/10-present  Code Status: Full code   Subjective: Patient with no complaints  Objective: Vital signs were reviewed and unremarkable. Vitals:   05/28/22 1159 05/28/22 1525  BP: (!) 142/70 (!) 140/57  Pulse: 79 70  Resp: 18 18  Temp: 98.7 F (37.1 C) 98.4 F (36.9 C)  SpO2: 98% 100%    Intake/Output Summary (Last 24 hours) at 05/28/2022 1540 Last data filed at 05/28/2022 0400 Gross per 24 hour  Intake --  Output 500 ml  Net -500 ml   Filed Weights   05/27/22 1715  Weight: 61.7 kg   Body mass index is 20.68 kg/m.  Exam:  General: Alert and oriented x3, no acute distress HEENT: Normocephalic, atraumatic, mucous membranes are moist Cardiovascular: Regular rate and rhythm, S1-S2 Respiratory: Clear to auscultation bilaterally Abdomen: Soft, nontender, nondistended, positive bowel sounds Musculoskeletal: No clubbing or cyanosis or edema Skin: No skin breaks, tears or lesions Psychiatry: Appropriate, no evidence of psychoses Neurology: No focal deficits  Data Reviewed: Hemoglobin remaining stable  Disposition:  Status is: Inpatient Remains inpatient appropriate because: Plan for loopogram    Anticipated discharge date: 6/13 Family Communication: Left message for family DVT Prophylaxis: SCDs Start: 05/27/22 2052    Author: SAnnita Brod,MD 05/28/2022 3:40 PM  To reach On-call, see care teams to locate the attending and reach out via [email or web address] Between 7PM-7AM, please contact night-coverage If you still have difficulty reaching the attending  provider, please page the [facility] (Director on Call) for [facility] on amion for assistance.

## 2022-05-29 ENCOUNTER — Inpatient Hospital Stay: Payer: PPO

## 2022-05-29 ENCOUNTER — Encounter: Payer: Self-pay | Admitting: Urology

## 2022-05-29 DIAGNOSIS — N3001 Acute cystitis with hematuria: Secondary | ICD-10-CM

## 2022-05-29 DIAGNOSIS — D62 Acute posthemorrhagic anemia: Secondary | ICD-10-CM | POA: Diagnosis not present

## 2022-05-29 DIAGNOSIS — R55 Syncope and collapse: Secondary | ICD-10-CM | POA: Diagnosis not present

## 2022-05-29 DIAGNOSIS — E876 Hypokalemia: Secondary | ICD-10-CM | POA: Diagnosis not present

## 2022-05-29 LAB — BASIC METABOLIC PANEL
Anion gap: 5 (ref 5–15)
BUN: 18 mg/dL (ref 8–23)
CO2: 24 mmol/L (ref 22–32)
Calcium: 8.7 mg/dL — ABNORMAL LOW (ref 8.9–10.3)
Chloride: 112 mmol/L — ABNORMAL HIGH (ref 98–111)
Creatinine, Ser: 1.23 mg/dL — ABNORMAL HIGH (ref 0.44–1.00)
GFR, Estimated: 44 mL/min — ABNORMAL LOW (ref >60)
Glucose, Bld: 95 mg/dL (ref 70–99)
Potassium: 3.2 mmol/L — ABNORMAL LOW (ref 3.5–5.1)
Sodium: 141 mmol/L (ref 135–145)

## 2022-05-29 LAB — CBC
HCT: 29.2 % — ABNORMAL LOW (ref 36.0–46.0)
Hemoglobin: 9 g/dL — ABNORMAL LOW (ref 12.0–15.0)
MCH: 27.4 pg (ref 26.0–34.0)
MCHC: 30.8 g/dL (ref 30.0–36.0)
MCV: 88.8 fL (ref 80.0–100.0)
Platelets: 328 10*3/uL (ref 150–400)
RBC: 3.29 MIL/uL — ABNORMAL LOW (ref 3.87–5.11)
RDW: 13.2 % (ref 11.5–15.5)
WBC: 6.8 10*3/uL (ref 4.0–10.5)
nRBC: 0 % (ref 0.0–0.2)

## 2022-05-29 LAB — MAGNESIUM: Magnesium: 2.2 mg/dL (ref 1.7–2.4)

## 2022-05-29 IMAGING — RF DG LOOPOGRAM
13 of 22 series · 14 of 24 positions shown · IV contrast (omnipaque)
Comparison: CT abdomen/pelvis [DATE].

FLUOROSCOPY:
Fluoroscopy time: 3 minutes, 36 seconds (31.30 mGy).

CLINICAL DATA: Patient with prior cystectomy and right lower
quadrant ileal conduit urinary diversion. Gross hematuria for 6
weeks.

EXAM:
LOOPOGRAM
TECHNIQUE: The patient's ileostomy bag was removed. A 5 French
hysterosalpingogram catheter was inserted into the ileostomy stoma
and the balloon was partially inflated. Subsequently, 75 mL of
sterile Omnipaque 300 contrast was hand injected via the catheter.
Multiple overhead fluoroscopic images radiographs were taken. The
patient tolerated the procedure well without discomfort. There was
no immediate complication. An ileostomy bag was reapplied.

[Series 1: t abdomen supine · 0.14mm/px · 1 of 1 slices shown]
[im 1/1]
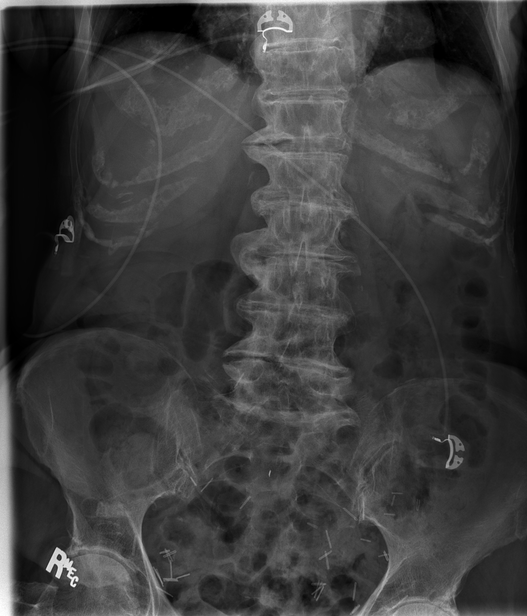

[Series 3: fluoro_iodine_singleshot_bw · 0.18mm/px · 1 of 1 slices shown (1 of 4)]
[im 1/1]
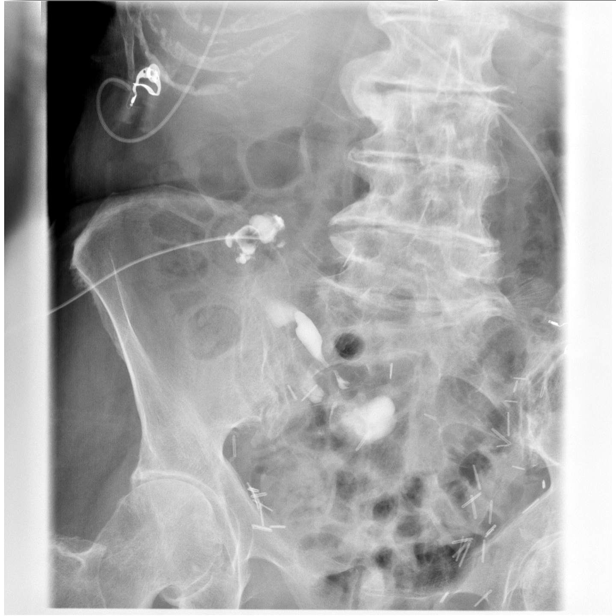

[Series 6: cp_standard · 0.27mm/px · 1 of 1 slices shown (1 of 8)]
[im 1/1]
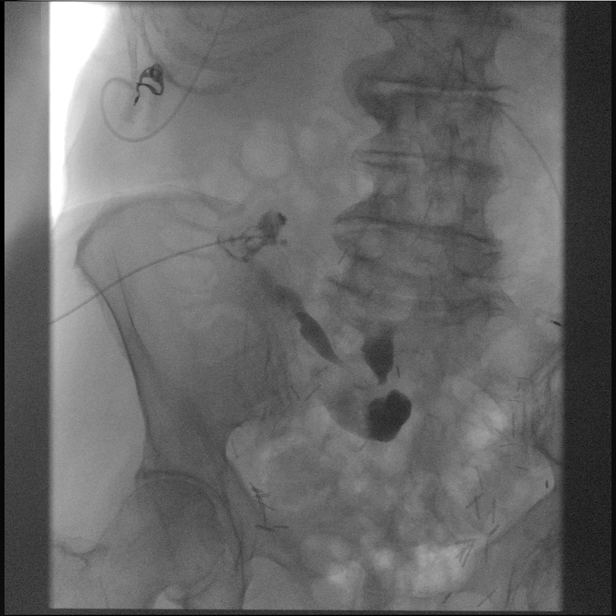

[Series 8: cp_standard · 0.27mm/px · 1 of 1 slices shown (2 of 8)]
[im 1/1]
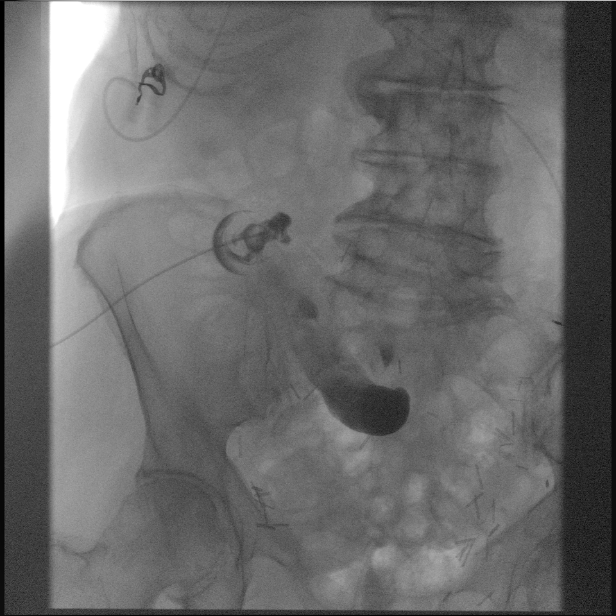

[Series 9: cp_standard · 0.27mm/px · 1 of 1 slices shown (3 of 8)]
[im 1/1]
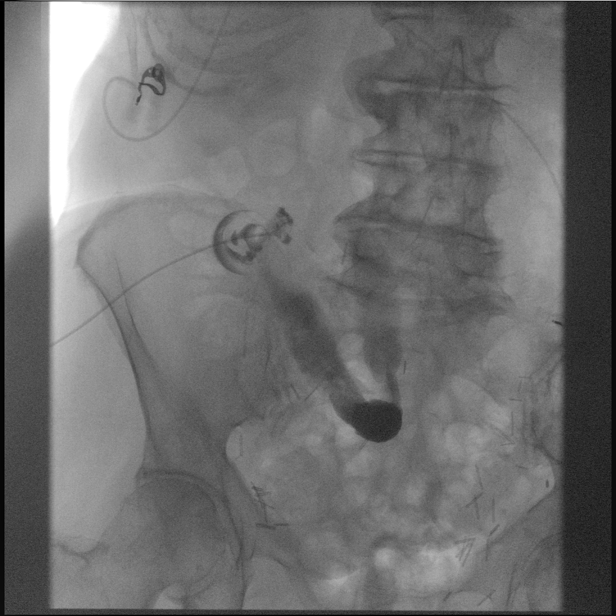

[Series 11: fluoro_iodine_singleshot_bw · 0.18mm/px · 1 of 1 slices shown (2 of 4)]
[im 1/1]
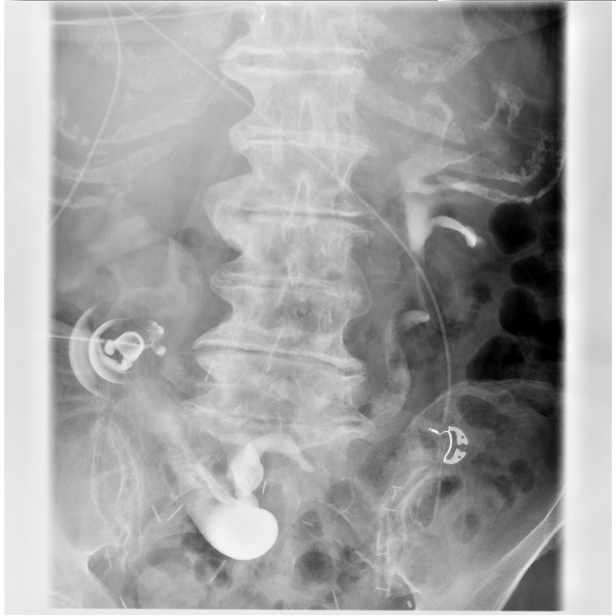

[Series 13: fluoro_iodine_singleshot_bw · 0.18mm/px · 1 of 1 slices shown (3 of 4)]
[im 1/1]
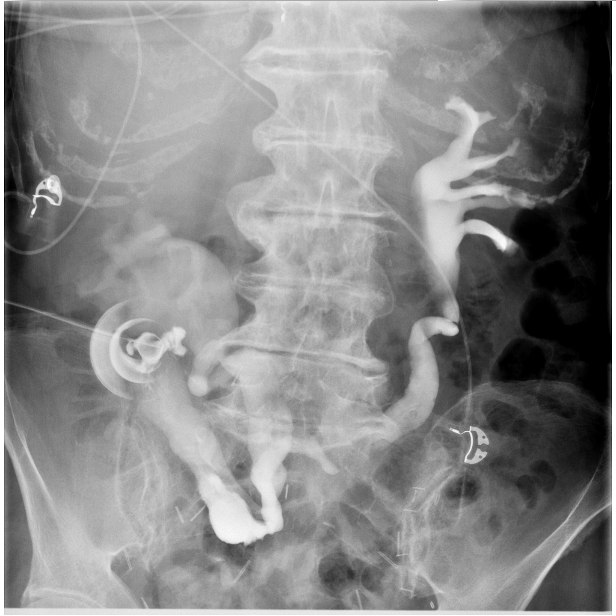

[Series 15: cp_standard · 0.27mm/px · 1 of 1 slices shown (4 of 8)]
[im 1/1]
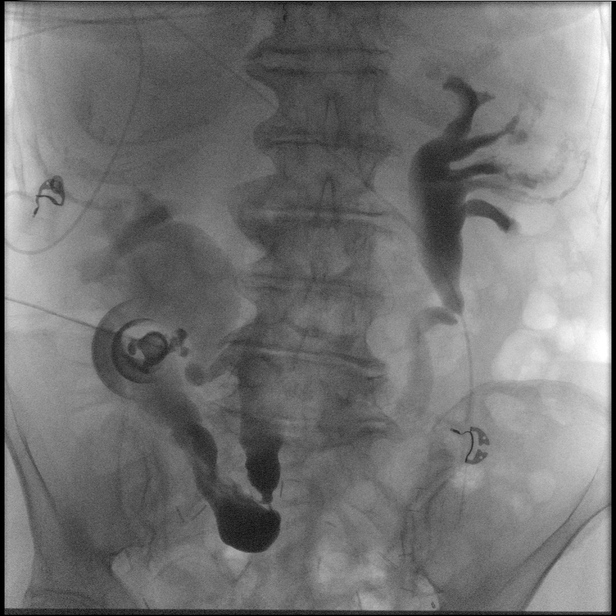

[Series 17: cp_standard · 0.27mm/px · 1 of 1 slices shown (5 of 8)]
[im 1/1]
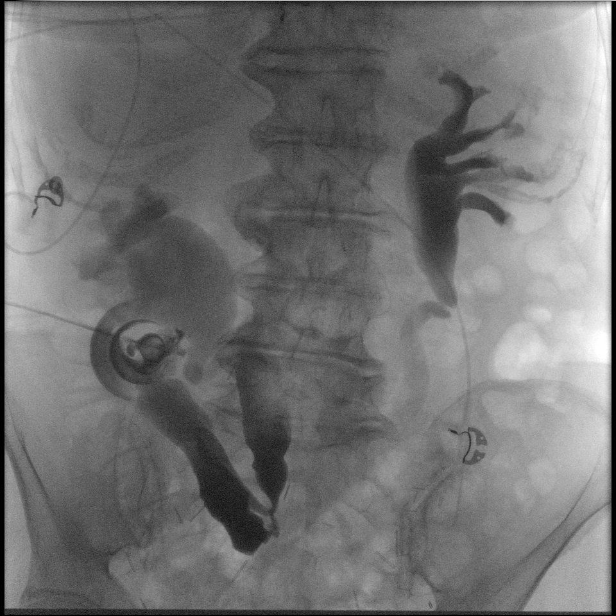

[Series 19: cp_standard · 0.27mm/px · 1 of 1 slices shown (6 of 8)]
[im 1/1]
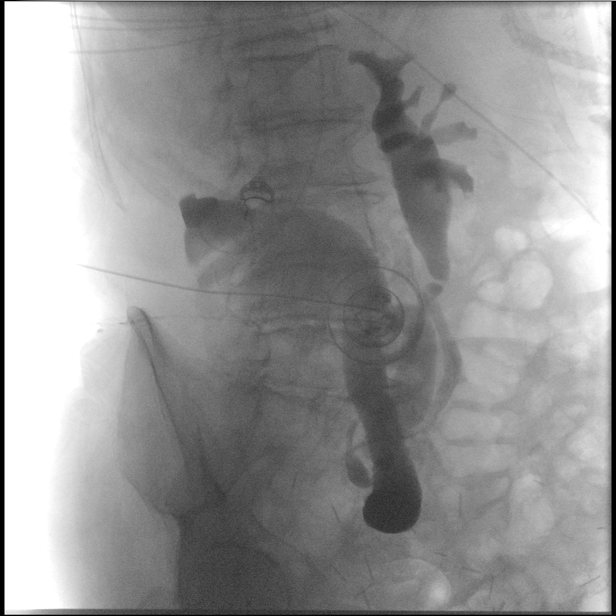

[Series 21: fluoro_iodine_singleshot_bw · 0.18mm/px · 1 of 1 slices shown (4 of 4)]
[im 1/1]
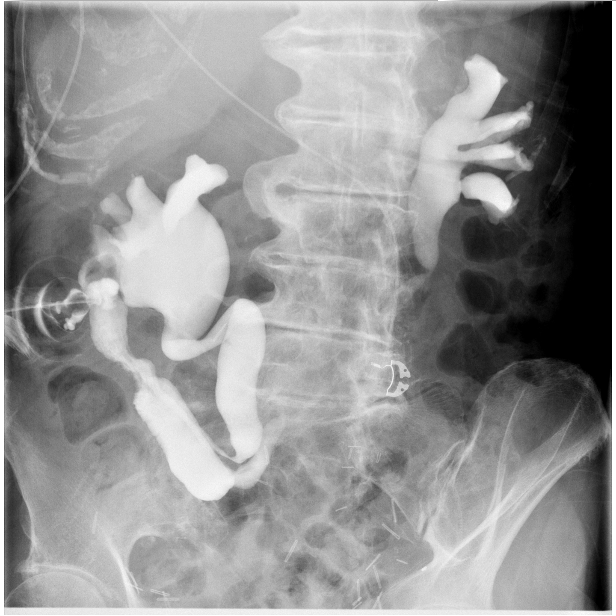

[Series 22: cp_standard · 0.27mm/px · 2 of 4 frames shown (7 of 8)]
[frame 1/4]
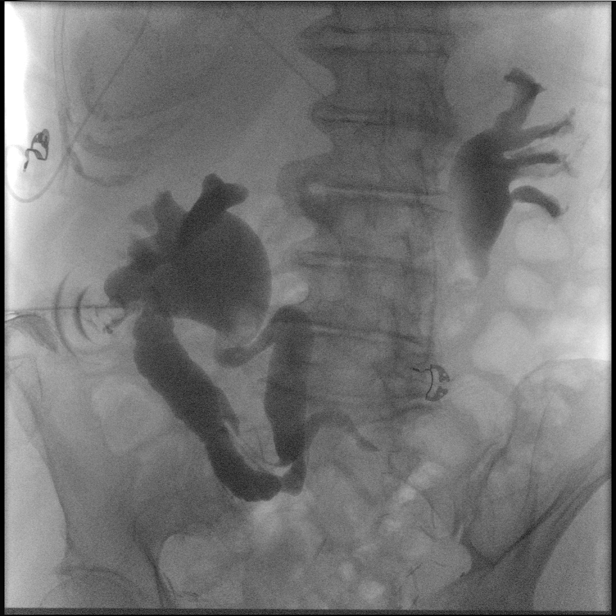
[frame 4/4]
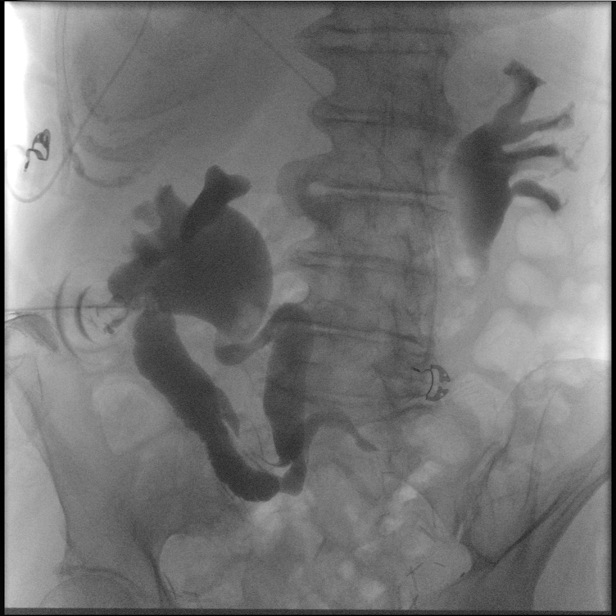

[Series 24: cp_standard · 0.27mm/px · 1 of 1 slices shown (8 of 8)]
[im 1/1]
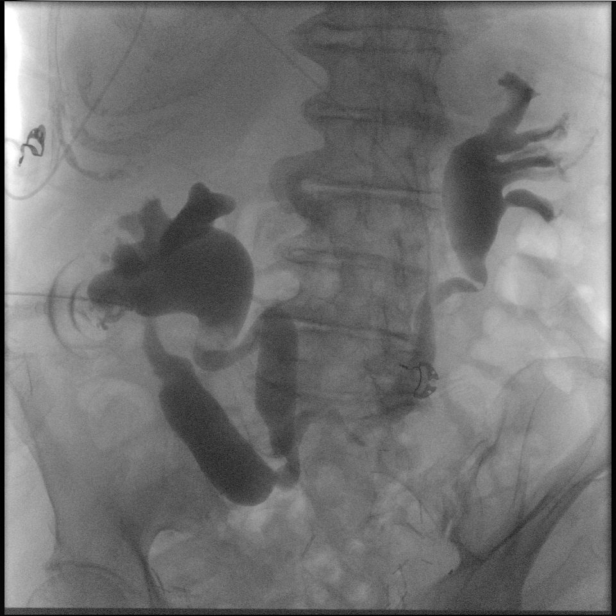

[14 of 24 positions shown; findings below may reference images not displayed]

FINDINGS: Satisfactory contrast opacification of the ileal conduit, ureters,
renal pelves and renal calices. As demonstrated on the recent prior
CT abdomen/pelvis of [DATE], there is bilateral
hydroureteronephrosis (severe right, moderate-to-severe left). No
filling defect identified within the renal pelves, renal calices,
ureters or ileal conduit. No stricture identified.
IMPRESSION: Fluoroscopic loopogram, as described.

Redemonstrated bilateral hydroureteronephrosis (severe right,
moderate-to-severe left).

No filling defect identified within the renal pelves, renal calices,
ureters or ileal conduit.

No stricture identified.

## 2022-05-29 MED ORDER — POTASSIUM CHLORIDE CRYS ER 20 MEQ PO TBCR
40.0000 meq | EXTENDED_RELEASE_TABLET | Freq: Once | ORAL | Status: AC
  Administered 2022-05-29: 40 meq via ORAL
  Filled 2022-05-29: qty 2

## 2022-05-29 MED ORDER — AMLODIPINE BESYLATE 10 MG PO TABS
10.0000 mg | ORAL_TABLET | Freq: Every day | ORAL | Status: DC
  Administered 2022-05-29 – 2022-05-31 (×3): 10 mg via ORAL
  Filled 2022-05-29 (×3): qty 1

## 2022-05-29 MED ORDER — IOHEXOL 300 MG/ML  SOLN
100.0000 mL | Freq: Once | INTRAMUSCULAR | Status: AC | PRN
  Administered 2022-05-29: 100 mL

## 2022-05-29 MED ORDER — AMLODIPINE BESYLATE 5 MG PO TABS: 5.0000 mg | ORAL_TABLET | Freq: Every day | ORAL | Status: DC

## 2022-05-29 NOTE — TOC Initial Note (Signed)
Transition of Care [facility]) - Initial/Assessment Note    Patient Details  Name: Sara Aguirre MRN: [id number] Date of Birth: May 20, 1941  [facility] of [facility]) CM/SW Contact:    Laurena Slimmer, RN Phone Number: 05/29/2022, 3:36 PM  Clinical Narrative:                  Transition of Care [facility]) Screening Note   Patient Details  Name: Sara Aguirre Date of Birth: [birth year]   Transition of Care [facility]) CM/SW Contact:    Laurena Slimmer, RN Phone Number: 05/29/2022, 3:36 PM    Transition of Care Department [facility]) has reviewed patient and no TOC needs have been identified at this time. We will continue to monitor patient advancement through interdisciplinary progression rounds. If new patient transition needs arise, please place a TOC consult.          Patient Goals and CMS Choice        Expected Discharge Plan and Services                                                Prior Living Arrangements/Services                       Activities of Daily Living Home Assistive Devices/Equipment: Tenbrink (specify type) (has [place] but dont use it) ADL Screening (condition at time of admission) Patient's cognitive ability adequate to safely complete daily activities?: Yes Is the patient deaf or have difficulty hearing?: No Does the patient have difficulty seeing, even when wearing glasses/contacts?: No Does the patient have difficulty concentrating, remembering, or making decisions?: No Patient able to express need for assistance with ADLs?: Yes Does the patient have difficulty dressing or bathing?: No Independently performs ADLs?: Yes (appropriate for developmental age) Does the patient have difficulty walking or climbing stairs?: No Weakness of Legs: None Weakness of Arms/Hands: None  Permission Sought/Granted                  Emotional Assessment              Admission diagnosis:  Syncope and collapse [R55] Patient Active Problem List    Diagnosis Date Noted   Hematuria with ABLA (acute blood loss anemia) 05/27/2022   Syncope and collapse 05/27/2022   Constipation on CT 05/27/2022   Urinary tract infection 05/27/2022   Cystitis 04/19/2022   Carotid stenosis, symptomatic, with infarction ([place]) 11/08/2021   Bilateral carotid artery stenosis 10/20/2021   History of TIA (transient ischemic attack) 10/08/2021   CKD (chronic kidney disease), stage IIIb 10/08/2021   COVID-19 virus infection 10/08/2021   Benign hypertensive kidney disease with chronic kidney disease 09/27/2021   Hematuria 09/27/2021   Proteinuria 09/27/2021   History of CVA s/p R ICA stent(cerebrovascular accident) 04/21/2020   Hypokalemia 04/21/2020   Pure hypercholesterolemia 01/17/2017   Thyroid nodule 08/20/2013   Hypertension 08/20/2013   Body mass index (BMI) of 24.0-24.9 in adult 08/20/2013   Gastric ulcer 12/29/2011   Renal insufficiency 12/29/2011   History of bladder cancer, s/p cystectomy with ileal conduit    PCP:  Pleas Koch, NP Pharmacy:   [id number] [place] [place]Phone: [phone]: [phone]    Social Determinants of Health (SDOH) Interventions    Readmission  Risk Interventions     No data to display

## 2022-05-29 NOTE — Assessment & Plan Note (Signed)
Replete and recheck, check magnesium

## 2022-05-29 NOTE — Assessment & Plan Note (Signed)
Urology following

## 2022-05-29 NOTE — Progress Notes (Signed)
  Progress Note   Patient: Sara Aguirre MWN:[id number] DOB: 10-27-1941 DOA: 05/27/2022     2 DOS: the patient was seen and examined on 05/29/2022   Brief hospital course: 81 year old female with past medical history of bladder cancer status post cystectomy with ileal conduit, CVA status post right ICA stent on aspirin plus Plavix, stage IIIb chronic kidney disease and hypertension who for the past 6 weeks has been during bright red urostomy output and has been seen by urology with plans for outpatient loopogram and CT.  However, patient was brought into the emergency room on 6/10 after a syncopal event while grocery shopping.  In emergency room, hemoglobin down to 9.8 (had been 13.33-monthprior) along with a white blood cell count of 18 as well as a UTI with hematuria.    6/12 loopogram completed, await urology input   Assessment and Plan: * Syncope and collapse Secondary to symptomatic anemia from ongoing hematuria Not having any further syncopal/dizzy spell  Hematuria with ABLA (acute blood loss anemia) Acute versus chronic blood loss from urostomy History of bladder cancer s/p cystectomy with ileal conduit Hold aspirin and Plavix due to hematuria.  Transfuse if greater than two-point drop in 6 hours or if hemoglobin under 7.  loopogram on 6/12 -neck steps by urology based on results  Urinary tract infection Continue IV Rocephin.  Urine cultures growing 100,000 colonies of E. coli.  Afebrile and leukocytosis now resolved  History of bladder cancer, s/p cystectomy with ileal conduit Urology following  CKD (chronic kidney disease), stage IIIb Renal function at baseline.  Hypertension Restart home dose of amlodipine as blood pressures slowly going up  Constipation on CT CT abdomen and pelvis showed large burden of stool in distal colon and rectum with rectal stool ball.  Started on stool softener  History of CVA s/p R ICA stent(cerebrovascular accident) Holding aspirin and Plavix  due to hematuria.  Continue rosuvastatin.  Hypokalemia Replete and recheck, check magnesium        Subjective: Denies any further dizzy or syncopal spell.  Waiting for loopogram when I saw her  Physical Exam: Vitals:   05/28/22 1525 05/28/22 2046 05/29/22 0453 05/29/22 0905  BP: (!) 140/57 (!) 147/60 (!) 142/63 (!) 145/59  Pulse: 70 76 66 67  Resp: '18 16 16 17  '$ Temp: 98.4 F (36.9 C) 98.2 F (36.8 C) 98.1 F (36.7 C) 98.1 F (36.7 C)  TempSrc:  Oral Oral   SpO2: 100% 95% 97% 97%  Weight:      Height:       . General: Alert and oriented x3, no acute distress . HEENT: Normocephalic, atraumatic, mucous membranes are moist . Cardiovascular: Regular rate and rhythm, S1-S2 . Respiratory: Clear to auscultation bilaterally . Abdomen: Soft, nontender, nondistended, positive bowel sounds . Musculoskeletal: No clubbing or cyanosis or edema . Skin: No skin breaks, tears or lesions . Psychiatry: Appropriate, no evidence of psychoses . Neurology: No focal deficits .  Data Reviewed:  Potassium 3.2, urine culture growing E. coli  Family Communication: None  Disposition: Status is: Inpatient Remains inpatient appropriate because: Further urological work-up    Planned Discharge Destination: Home    DVT prophylaxis-SCD Time spent: 35 minutes  Author: VMax Sane MD 05/29/2022 1:01 PM  For on call review [email or web address]

## 2022-05-30 ENCOUNTER — Inpatient Hospital Stay: Payer: PPO

## 2022-05-30 ENCOUNTER — Encounter: Payer: Self-pay | Admitting: Internal Medicine

## 2022-05-30 DIAGNOSIS — R319 Hematuria, unspecified: Secondary | ICD-10-CM | POA: Diagnosis not present

## 2022-05-30 DIAGNOSIS — N390 Urinary tract infection, site not specified: Secondary | ICD-10-CM | POA: Diagnosis not present

## 2022-05-30 DIAGNOSIS — I10 Essential (primary) hypertension: Secondary | ICD-10-CM

## 2022-05-30 DIAGNOSIS — D62 Acute posthemorrhagic anemia: Secondary | ICD-10-CM | POA: Diagnosis not present

## 2022-05-30 DIAGNOSIS — R55 Syncope and collapse: Secondary | ICD-10-CM | POA: Diagnosis not present

## 2022-05-30 DIAGNOSIS — N3001 Acute cystitis with hematuria: Secondary | ICD-10-CM | POA: Diagnosis not present

## 2022-05-30 LAB — BASIC METABOLIC PANEL
Anion gap: 3 — ABNORMAL LOW (ref 5–15)
BUN: 17 mg/dL (ref 8–23)
CO2: 25 mmol/L (ref 22–32)
Calcium: 8.5 mg/dL — ABNORMAL LOW (ref 8.9–10.3)
Chloride: 112 mmol/L — ABNORMAL HIGH (ref 98–111)
Creatinine, Ser: 1.17 mg/dL — ABNORMAL HIGH (ref 0.44–1.00)
GFR, Estimated: 46 mL/min — ABNORMAL LOW (ref >60)
Glucose, Bld: 101 mg/dL — ABNORMAL HIGH (ref 70–99)
Potassium: 3.6 mmol/L (ref 3.5–5.1)
Sodium: 140 mmol/L (ref 135–145)

## 2022-05-30 LAB — CBC
HCT: 28.3 % — ABNORMAL LOW (ref 36.0–46.0)
Hemoglobin: 8.9 g/dL — ABNORMAL LOW (ref 12.0–15.0)
MCH: 28 pg (ref 26.0–34.0)
MCHC: 31.4 g/dL (ref 30.0–36.0)
MCV: 89 fL (ref 80.0–100.0)
Platelets: 334 10*3/uL (ref 150–400)
RBC: 3.18 MIL/uL — ABNORMAL LOW (ref 3.87–5.11)
RDW: 13.1 % (ref 11.5–15.5)
WBC: 6.3 10*3/uL (ref 4.0–10.5)
nRBC: 0 % (ref 0.0–0.2)

## 2022-05-30 LAB — URINE CULTURE: Culture: >=100000 — AB

## 2022-05-30 IMAGING — CT CT ABD-PEL WO/W CM
2 of 14 series · 13 of 46 positions shown, 18 images · IV contrast (APPLIED)
Comparison: CT abdomen and pelvis dated [DATE]

CLINICAL DATA: History of bladder cancer status post cystectomy
with ileal conduit; * Tracking Code: BO *

EXAM:
CT ABDOMEN AND PELVIS WITHOUT AND WITH CONTRAST
TECHNIQUE: Multidetector CT imaging of the abdomen and pelvis was performed
following the standard protocol before and following the bolus
administration of intravenous contrast.

[Series 6: coronal pre · coronal · non-contrast · 0.74mm/px · 2 of 77 slices shown, 3 images]
[im 26/77  soft-tissue]
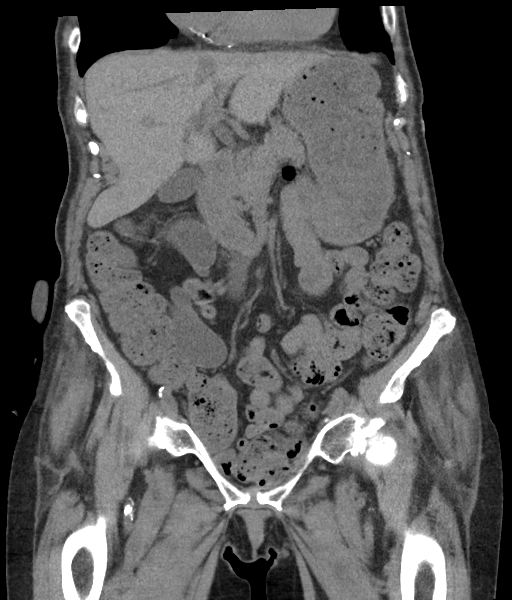
[im 26/77  bone]
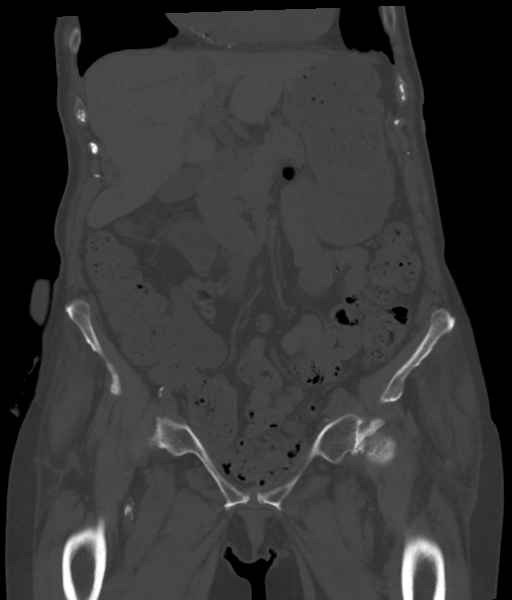
[im 51/77  soft-tissue]
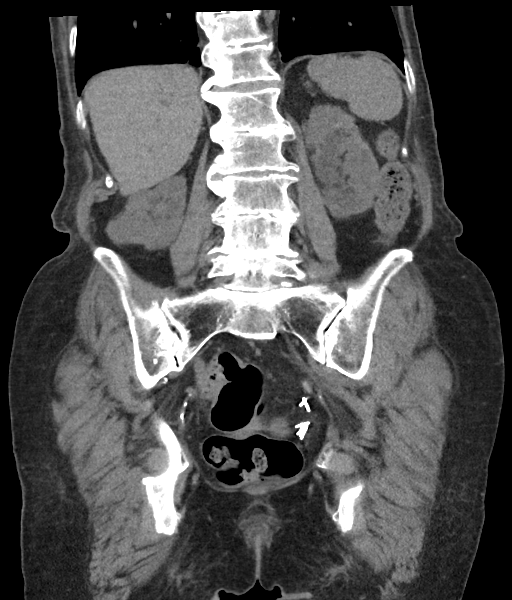

[Series 18: thins post 2 · axial · 0.70mm/px · z∈[-909,-534]mm · 11 of 876 slices shown, 15 images]
[im 84/876  soft-tissue]
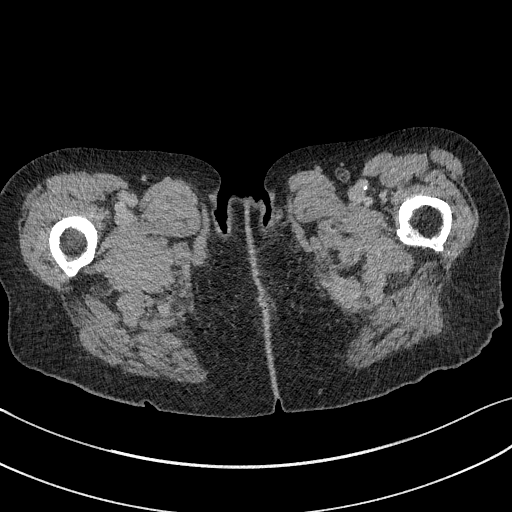
[im 84/876  bone]
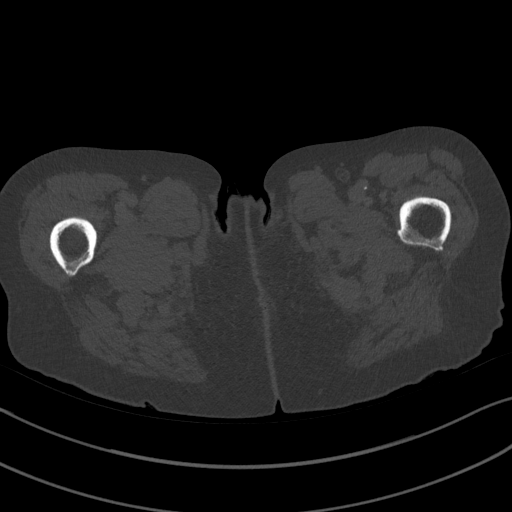
[im 167/876  soft-tissue]
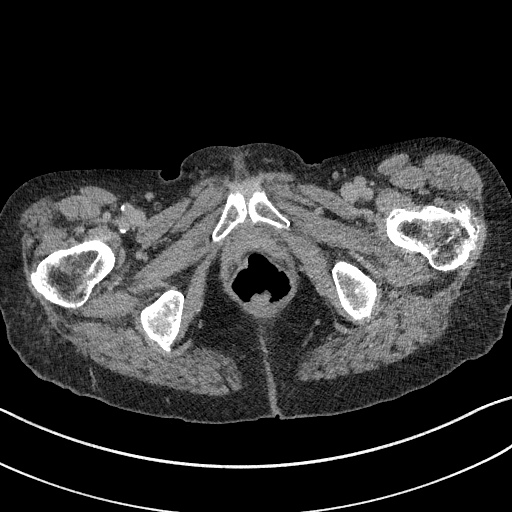
[im 251/876  soft-tissue]
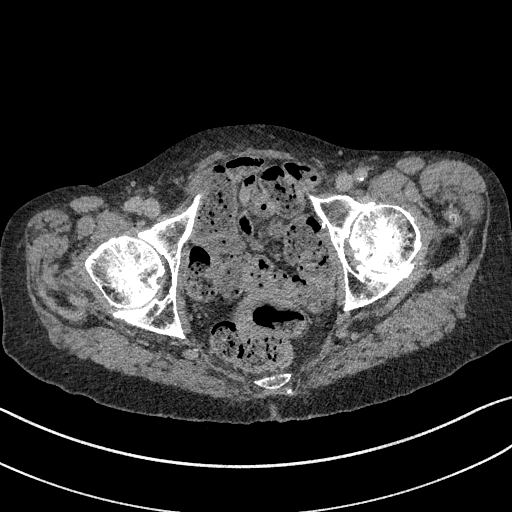
[im 334/876  soft-tissue]
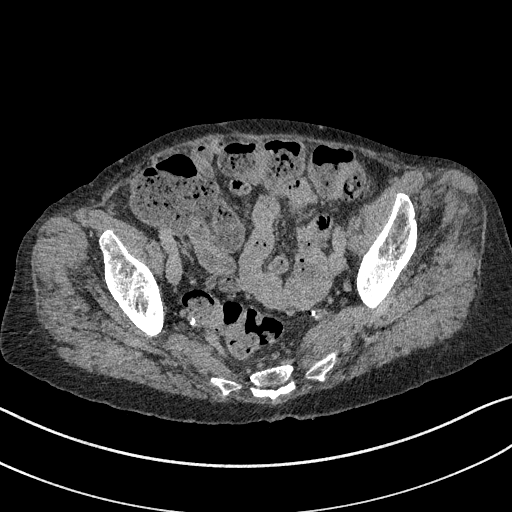
[im 459/876  soft-tissue]
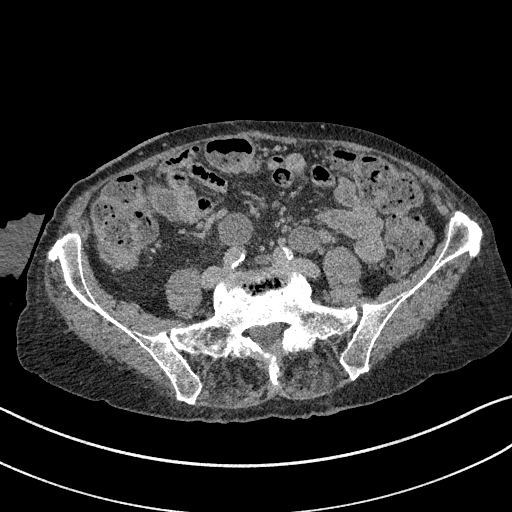
[im 542/876  soft-tissue]
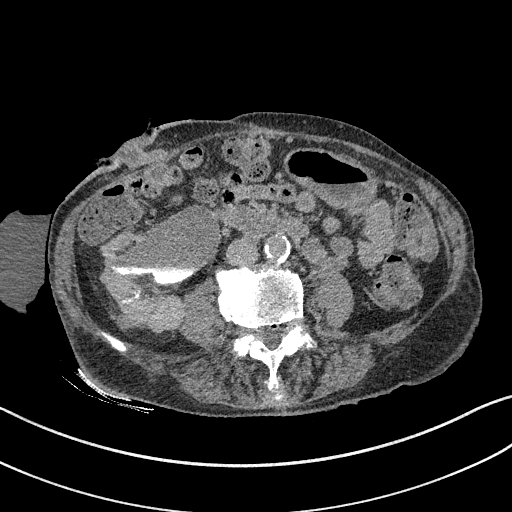
[im 626/876  soft-tissue]
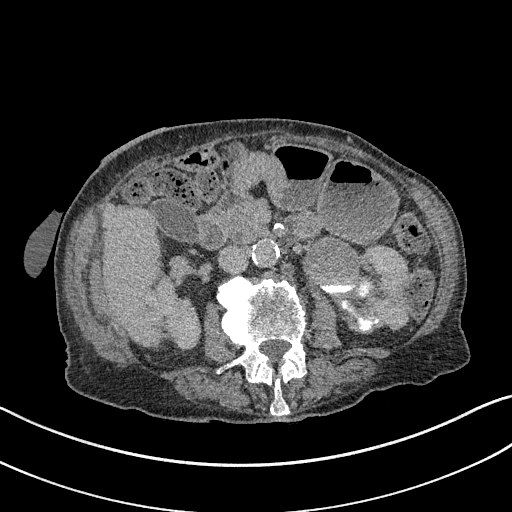
[im 709/876  soft-tissue]
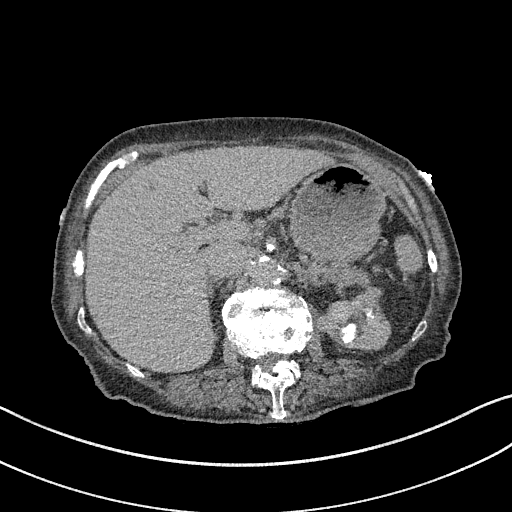
[im 709/876  lung]
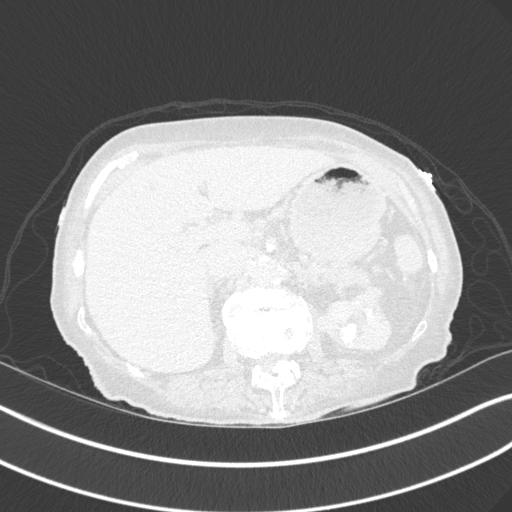
[im 751/876  lung]
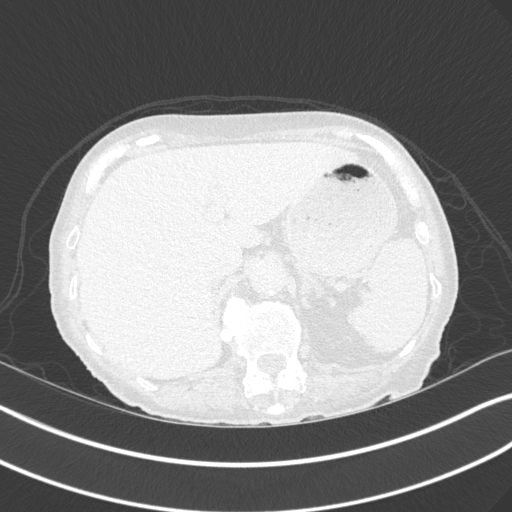
[im 792/876  soft-tissue]
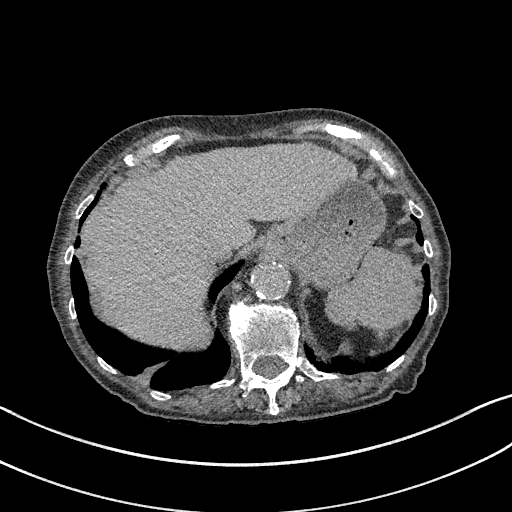
[im 792/876  lung]
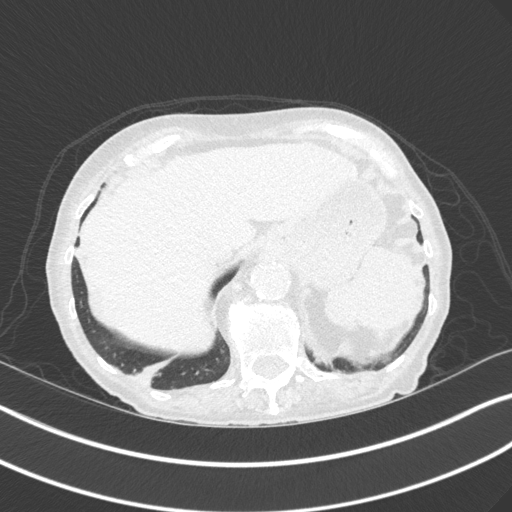
[im 792/876  bone]
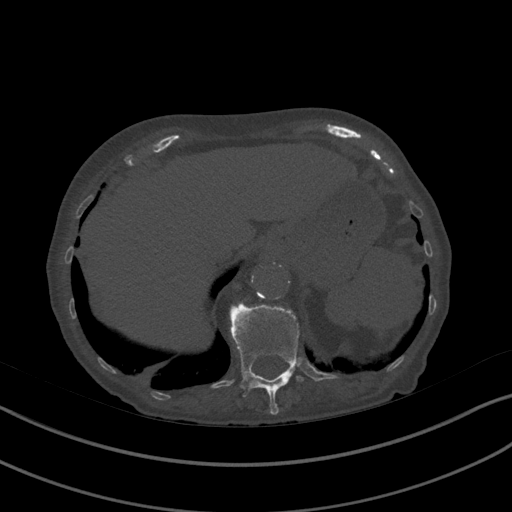
[im 834/876  lung]
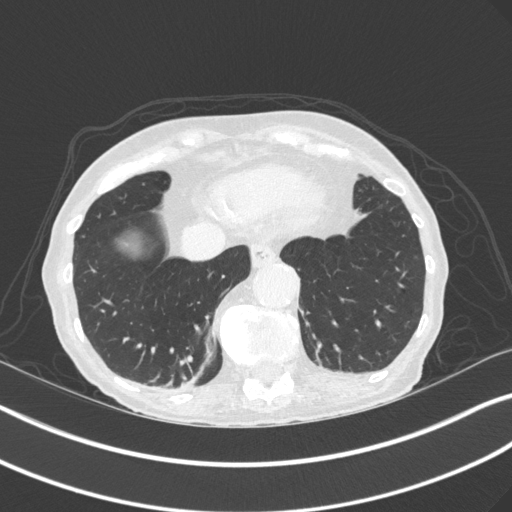

[13 of 46 positions shown; findings below may reference images not displayed]

RADIATION DOSE REDUCTION: This exam was performed according to the
departmental dose-optimization program which includes automated
exposure control, adjustment of the mA and/or kV according to
patient size and/or use of iterative reconstruction technique.

CONTRAST:  85mL OMNIPAQUE IOHEXOL 300 MG/ML  SOLN
FINDINGS: Lower chest: No acute abnormality.

Hepatobiliary: No focal liver abnormality is seen. No gallstones,
gallbladder wall thickening, or biliary dilatation.

Pancreas: Unremarkable. No pancreatic ductal dilatation or
surrounding inflammatory changes.

Spleen: Normal in size without focal abnormality.

Adrenals/Urinary Tract: Bilateral adrenal glands are unremarkable.
Lobulated contour of the bilateral kidneys, likely due to scarring.
Enhancing solid lesion of the upper pole of the left kidney
measuring up to 1.3 cm on series 4, image 17 and enhancing solid
lesion of the upper pole the left kidney measuring up to 1.2 cm on
image 18. Additional bilateral simple appearing renal cysts. Status
post cystectomy with right lower quadrant ileal conduit urinary
diversion. Severe right-greater-than-left hydronephrosis and
hydroureter. On delayed imaging, only the renal calices are
opacified. Multiple linear filling defects are seen within the right
renal calices, concerning for blood clots.

Stomach/Bowel: Stomach is within normal limits. No evidence of bowel
wall thickening, distention, or inflammatory changes.

Vascular/Lymphatic: Aortic atherosclerosis. No enlarged abdominal or
pelvic lymph nodes.

Reproductive: No adnexal masses.

Other: No abdominal wall hernia or abnormality. No abdominopelvic
ascites.

Musculoskeletal: No acute or significant osseous findings.
IMPRESSION: 1. Status post cystectomy with right lower quadrant ileal conduit
urinary diversion.
2. Severe right-greater-than-left bilateral hydronephrosis and
hydroureter with multiple linear filling defects seen within the
right renal calices, concerning for blood clots.
3. Enhancing solid lesions of the upper pole of the left kidney
measuring 1.2 and 1.3 cm, concerning for RCC.
4. Aortic Atherosclerosis ([HV]-[HV]).

## 2022-05-30 MED ORDER — CEFDINIR 300 MG PO CAPS
300.0000 mg | ORAL_CAPSULE | Freq: Two times a day (BID) | ORAL | Status: DC
  Administered 2022-05-30 – 2022-05-31 (×3): 300 mg via ORAL
  Filled 2022-05-30 (×3): qty 1

## 2022-05-30 MED ORDER — LOSARTAN POTASSIUM 50 MG PO TABS
50.0000 mg | ORAL_TABLET | Freq: Every day | ORAL | Status: DC
  Administered 2022-05-30 – 2022-05-31 (×2): 50 mg via ORAL
  Filled 2022-05-30 (×2): qty 1

## 2022-05-30 MED ORDER — IOHEXOL 300 MG/ML  SOLN
85.0000 mL | Freq: Once | INTRAMUSCULAR | Status: AC | PRN
  Administered 2022-05-30: 85 mL via INTRAVENOUS

## 2022-05-30 NOTE — Assessment & Plan Note (Signed)
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  Transfuse if greater than two-point drop in 6 hours or if hemoglobin under 7.  loopogram showed no stricture.  Loopogram showing no significant findings to account for gross hematuria. CT urogram ordered for today..  Wound nurse to evaluate stoma Urology requesting GI consult to evaluate for GI bleed .

## 2022-05-30 NOTE — Consult Note (Signed)
Sara Darby, MD [address]  [place]  [place], [place] [address]  Main: [phone]  Fax: [phone] Pager: [phone]   Consultation  Referring Provider:     No ref. provider found Primary Care Physician:  Pleas Koch, NP Primary Gastroenterologist: Althia Forts    Reason for Consultation:   Acute anemia Date of Admission:  05/27/2022 Date of Consultation:  05/30/2022         HPI:   Sara Aguirre is a 81 y.o. female with history of bladder cancer s/p cystectomy and ileal conduit, history of CVA s/p right ICA stent on aspirin and Plavix, hypertension, stage III CKD.  Apparently, patient has been having beet colored urostomy output for the past 6 weeks seen by urologist, Dr. Bernardo Heater on 6/7 with plans for an outpatient loopogram and CT is admitted after syncope at [facility].  Patient denies any black stools, rectal bleeding.  In the ER, patient was hemodynamically stable, found to have hemoglobin 9.8 down from 13.5 at baseline month ago.  Has leukocytosis 18 K.  Urinalysis revealed gross hematuria and many bacteria.  Patient underwent CT abdomen and pelvis which revealed large stool burden in the distal colon and rectum, rectal stool ball measuring at least 10 cm. Pt had BMs since admission and reports no rectal bleeding, melena, abdominal pain. 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 Risa Grill   CAROTID PTA/STENT INTERVENTION Right 11/08/2021   Procedure: CAROTID PTA/STENT INTERVENTION;  Surgeon: Katha Cabal, MD;  Location: [facility];  Service: Cardiovascular;  Laterality: Right;   HERNIA REPAIR  12/05/11   parastomal   LOOP RECORDER INSERTION N/A 04/23/2020   Procedure: LOOP RECORDER INSERTION;  Surgeon: Deboraha Sprang, MD;  Location: [facility];  Service: Cardiovascular;  Laterality: N/A;   PARASTOMAL HERNIA REPAIR  12/05/2011   Procedure: HERNIA REPAIR PARASTOMAL;  Surgeon: Adin Hector, MD;  Location: [facility];  Service: General;;  Laprascopic lysis of adhestons with reduction and repair with biological mesh for incarcerated parastomal and ventral long incisional hernia.   REVISION UROSTOMY CUTANEOUS     VENTRAL HERNIA REPAIR  12/05/2011   Procedure: HERNIA REPAIR VENTRAL ADULT;  Surgeon: Adin Hector, MD;  Location: [facility];  Service: General;;   Current Facility-Administered Medications:    acetaminophen (TYLENOL) tablet 650 mg, 650 mg, Oral, Q6H PRN **OR** acetaminophen (TYLENOL) suppository 650 mg, 650 mg, Rectal, Q6H PRN, Athena Masse, MD   amLODipine (NORVASC) tablet 10 mg, 10 mg, Oral, Daily, Manuella Ghazi, Vipul, MD, 10 mg at 05/30/22 0900   cefdinir (OMNICEF) capsule 300 mg, 300 mg, Oral, Q12H, Max Sane, MD, 300 mg at 05/30/22 1533   docusate sodium (COLACE)  capsule 100 mg, 100 mg, Oral, BID, Gevena Barre K, MD, 100 mg at 05/30/22 0900   hydrALAZINE (APRESOLINE) injection 5 mg, 5 mg, Intravenous, Q6H PRN, Athena Masse, MD   losartan (COZAAR) tablet 50 mg, 50 mg, Oral, Daily, Manuella Ghazi, Vipul, MD, 50 mg at 05/30/22 1307   ondansetron (ZOFRAN) tablet 4 mg, 4 mg, Oral, Q6H PRN **OR** ondansetron (ZOFRAN) injection 4 mg, 4 mg, Intravenous, Q6H PRN, Athena Masse, MD   polyethylene glycol (MIRALAX / GLYCOLAX) packet 17 g, 17 g, Oral,  Daily, Gevena Barre K, MD, 17 g at 05/30/22 0900   rosuvastatin (CRESTOR) tablet 5 mg, 5 mg, Oral, Daily, Judd Gaudier V, MD, 5 mg at 05/30/22 0900   senna-docusate (Senokot-S) tablet 1 tablet, 1 tablet, Oral, QHS PRN, Athena Masse, MD    Family History  Problem Relation Age of Onset   Heart disease Mother 33       CHF   Alcohol abuse Father    Stroke Father      Social History   Tobacco Use   Smoking status: Former    Types: Cigarettes    Quit date: 01/02/1993    Years since quitting: 29.4   Smokeless tobacco: Never  Vaping Use   Vaping Use: Never used  Substance Use Topics   Alcohol use: No   Drug use: No    Allergies as of 05/27/2022 - Review Complete 05/27/2022  Allergen Reaction Noted   Imodium [loperamide]  03/26/2019   Other  11/08/2021   Loperamide hcl Rash 12/04/2011    Review of Systems:    All systems reviewed and negative except where noted in HPI.   Physical Exam:  Vital signs in last 24 hours: Temp:  [97.5 F (36.4 C)-98.4 F (36.9 C)] 97.8 F (36.6 C) (06/13 1547) Pulse Rate:  [59-73] 73 (06/13 1547) Resp:  [16-20] 18 (06/13 1547) BP: (140-153)/(58-65) 150/64 (06/13 1547) SpO2:  [97 %-100 %] 99 % (06/13 1547) Weight:  [62.3 kg] 62.3 kg (06/12 2246) Last BM Date : 05/29/22 General:   Pleasant, cooperative in NAD Head:  Normocephalic and atraumatic. Eyes:   No icterus.   Conjunctiva pink. PERRLA. Ears:  Normal auditory acuity. Neck:  Supple; no masses or thyroidomegaly Lungs: Respirations even and unlabored. Lungs clear to auscultation bilaterally.   No wheezes, crackles, or rhonchi.  Heart:  Regular rate and rhythm;  Without murmur, clicks, rubs or gallops Abdomen:  Soft, nondistended, nontender. Ileal stoma in RLQ quadrant and the ostomy bag filled with red urine, non tender, stoma appeared healthy, Normal bowel sounds. No appreciable masses or hepatomegaly.  No rebound or guarding.  Rectal:  Not performed. Msk:  Symmetrical without  gross deformities.  Strength generalized weakness Extremities:  Without edema, cyanosis or clubbing. Neurologic:  Alert and oriented x3;  grossly normal neurologically. Skin:  Intact without significant lesions or rashes. Psych:  Alert and cooperative. Normal affect.  LAB RESULTS:    Latest Ref Rng & Units 05/30/2022    5:14 AM 05/29/2022    5:31 AM 05/28/2022    5:34 AM  CBC  WBC 4.0 - 10.5 K/uL 6.3  6.8    Hemoglobin 12.0 - 15.0 g/dL 8.9  9.0  9.0   Hematocrit 36.0 - 46.0 % 28.3  29.2    Platelets 150 - 400 K/uL 334  328      BMET    Latest Ref Rng & Units 05/30/2022    5:14 AM 05/29/2022    5:31 AM  05/27/2022    5:25 PM  BMP  Glucose 70 - 99 mg/dL 101  95  153   BUN 8 - 23 mg/dL 17  18  23    Creatinine 0.44 - 1.00 mg/dL 1.17  1.23  1.52   Sodium 135 - 145 mmol/L 140  141  135   Potassium 3.5 - 5.1 mmol/L 3.6  3.2  3.4   Chloride 98 - 111 mmol/L 112  112  104   CO2 22 - 32 mmol/L 25  24  24    Calcium 8.9 - 10.3 mg/dL 8.5  8.7  8.8     LFT    Latest Ref Rng & Units 10/08/2021    2:42 PM 08/11/2021   12:36 PM 12/23/2020   11:01 AM  Hepatic Function  Total Protein 6.5 - 8.1 g/dL 7.4  7.2  6.9   Albumin 3.5 - 5.0 g/dL 3.8  3.9  3.9   AST 15 - 41 U/L 20  24  16    ALT 0 - 44 U/L 10  17  7    Alk Phosphatase 38 - 126 U/L 59  68  93   Total Bilirubin 0.3 - 1.2 mg/dL 0.7  0.4  0.4      STUDIES: DG Loopogram  Result Date: 05/29/2022 CLINICAL DATA:  Patient with prior cystectomy and right lower quadrant ileal conduit urinary diversion. Gross hematuria for 6 weeks. EXAM: LOOPOGRAM TECHNIQUE: The patient's ileostomy bag was removed. A 5 French hysterosalpingogram catheter was inserted into the ileostomy stoma and the balloon was partially inflated. Subsequently, 75 mL of sterile Omnipaque 300 contrast was hand injected via the catheter. Multiple overhead fluoroscopic images radiographs were taken. The patient tolerated the procedure well without discomfort. There was no immediate  complication. An ileostomy bag was reapplied. COMPARISON:  CT abdomen/pelvis 05/27/2022. FLUOROSCOPY: Fluoroscopy time: 3 minutes, 36 seconds (31.30 mGy). FINDINGS: Satisfactory contrast opacification of the ileal conduit, ureters, renal pelves and renal calices. As demonstrated on the recent prior CT abdomen/pelvis of 05/27/2022, there is bilateral hydroureteronephrosis (severe right, moderate-to-severe left). No filling defect identified within the renal pelves, renal calices, ureters or ileal conduit. No stricture identified. IMPRESSION: Fluoroscopic loopogram, as described. Redemonstrated bilateral hydroureteronephrosis (severe right, moderate-to-severe left). No filling defect identified within the renal pelves, renal calices, ureters or ileal conduit. No stricture identified. Electronically Signed   By: Kellie Simmering D.O.   On: 05/29/2022 13:40      Impression / Plan:   Sara Aguirre is a 81 y.o. female with history of bladder cancer s/p cystectomy and ileal conduit, history of CVA s/p right ICA stent on aspirin and Plavix, hypertension, stage III CKD is admitted with subacute blood loss anemia, found to have dark-colored output from the ileal conduit past 8 weeks.  Subacute blood loss anemia: secondary to hematuria Normal BUN/creatinine ratio Patient has ileal conduit with dark-colored output and source of anemia in setting of DAPT Urology is on board Patient is undergoing CT scan for hematuria work-up Will wait for the CT scan results before proceeding with any GI evaluation, Very less likely the etiology Recommend iron panel, B12 and folate levels Mild AKI, currently improving  Thank you for involving me in the care of this patient.      LOS: 3 days   Sherri Sear, MD  05/30/2022, 4:16 PM    Note: This dictation was prepared with Dragon dictation along with smaller phrase technology. Any transcriptional errors that result from this process are unintentional.

## 2022-05-30 NOTE — Assessment & Plan Note (Signed)
Renal function at baseline and remained stable

## 2022-05-30 NOTE — Consult Note (Addendum)
[facility] Nurse ostomy consult note Urology tem requested a pouch change and stoma assessment.  Pt is familiar to [facility] tem from a previous admission in 2012. She received her urostomy "many years ago" and is independent with pouch changes and emptying.  She developed bloody urine recently and has just returned from a CT scan. Removed previous pouch.  Stoma is red and viable, slightly above skin level, 7/8 inches. No peristomal or stomal abnormalities or irritation noted.  Output: 300 cc bloody urine emptied. Ostomy pouching: 2pc.  Education provided:  Pt re-applied her own pouching system, and has extra supplies at the bedside. She does not use a barrier ring.  She uses a 2 piece convex system, opening is 1 inch and precut.  She does not want to attach to a bedside drainage bag. Please re-consult if further assistance is needed.  Thank-you,  Julien Girt MSN, [place], [place], [place], [place]

## 2022-05-30 NOTE — Assessment & Plan Note (Signed)
Repleted and resolved 

## 2022-05-30 NOTE — Assessment & Plan Note (Signed)
Holding aspirin and Plavix due to hematuria.  Continue rosuvastatin

## 2022-05-30 NOTE — Progress Notes (Signed)
Urology Inpatient Progress Note  Subjective: No acute events overnight.  She is afebrile, VSS. Creatinine down today, 1.17.  Hemoglobin stable, 8.9. Loopogram yesterday showed stable bilateral hydroureteronephrosis and no filling defect within the renal collecting systems, ureters, or ileal conduit.  CT urogram has been ordered for today. Urostomy draining clear, red urine without clots today.  She denies pain.  Anti-infectives: Anti-infectives (From admission, onward)    Start     Dose/Rate Route Frequency Ordered Stop   05/28/22 1900  cefTRIAXone (ROCEPHIN) 1 g in sodium chloride 0.9 % 100 mL IVPB        1 g 200 mL/hr over 30 Minutes Intravenous Every 24 hours 05/28/22 0016     05/27/22 1900  cefTRIAXone (ROCEPHIN) 1 g in sodium chloride 0.9 % 100 mL IVPB        1 g 200 mL/hr over 30 Minutes Intravenous  Once 05/27/22 1847 05/27/22 1919       Current Facility-Administered Medications  Medication Dose Route Frequency Provider Last Rate Last Admin   acetaminophen (TYLENOL) tablet 650 mg  650 mg Oral Q6H PRN Athena Masse, MD       Or   acetaminophen (TYLENOL) suppository 650 mg  650 mg Rectal Q6H PRN Athena Masse, MD       amLODipine (NORVASC) tablet 10 mg  10 mg Oral Daily Manuella Ghazi, Vipul, MD   10 mg at 05/29/22 1337   cefTRIAXone (ROCEPHIN) 1 g in sodium chloride 0.9 % 100 mL IVPB  1 g Intravenous Q24H Judd Gaudier V, MD 200 mL/hr at 05/29/22 1959 1 g at 05/29/22 1959   docusate sodium (COLACE) capsule 100 mg  100 mg Oral BID Annita Brod, MD   100 mg at 05/29/22 2222   hydrALAZINE (APRESOLINE) injection 5 mg  5 mg Intravenous Q6H PRN Athena Masse, MD       ondansetron [facility]) tablet 4 mg  4 mg Oral Q6H PRN Athena Masse, MD       Or   ondansetron [facility]) injection 4 mg  4 mg Intravenous Q6H PRN Athena Masse, MD       polyethylene glycol (MIRALAX / GLYCOLAX) packet 17 g  17 g Oral Daily Annita Brod, MD   17 g at 05/29/22 1337   rosuvastatin (CRESTOR)  tablet 5 mg  5 mg Oral Daily Judd Gaudier V, MD   5 mg at 05/29/22 1337   senna-docusate (Senokot-S) tablet 1 tablet  1 tablet Oral QHS PRN Athena Masse, MD       Objective: Vital signs in last 24 hours: Temp:  [97.5 F (36.4 C)-98.4 F (36.9 C)] 97.8 F (36.6 C) (06/13 0826) Pulse Rate:  [59-72] 60 (06/13 0826) Resp:  [12-20] 16 (06/13 0826) BP: (140-179)/(58-65) 147/61 (06/13 0826) SpO2:  [97 %-100 %] 97 % (06/13 0826) Weight:  [62.3 kg] 62.3 kg (06/12 2246)  Intake/Output from previous day: 06/12 0701 - 06/13 0700 In: 240 [P.O.:240] Out: 650 [Urine:650] Intake/Output this shift: No intake/output data recorded.  Physical Exam Vitals and nursing note reviewed.  Constitutional:      General: She is not in acute distress.    Appearance: She is not ill-appearing, toxic-appearing or diaphoretic.  HENT:     Head: Normocephalic and atraumatic.  Pulmonary:     Effort: Pulmonary effort is normal. No respiratory distress.  Skin:    General: Skin is warm and dry.  Neurological:     Mental Status: She is alert and  oriented to person, place, and time.  Psychiatric:        Mood and Affect: Mood normal.        Behavior: Behavior normal.    Lab Results:  Recent Labs    05/29/22 0531 05/30/22 0514  WBC 6.8 6.3  HGB 9.0* 8.9*  HCT 29.2* 28.3*  PLT 328 334   BMET Recent Labs    05/29/22 0531 05/30/22 0514  NA 141 140  K 3.2* 3.6  CL 112* 112*  CO2 24 25  GLUCOSE 95 101*  BUN 18 17  CREATININE 1.23* 1.17*  CALCIUM 8.7* 8.5*   Studies/Results: DG Loopogram  Result Date: 05/29/2022 CLINICAL DATA:  Patient with prior cystectomy and right lower quadrant ileal conduit urinary diversion. Gross hematuria for 6 weeks. EXAM: LOOPOGRAM TECHNIQUE: The patient's ileostomy bag was removed. A 5 French hysterosalpingogram catheter was inserted into the ileostomy stoma and the balloon was partially inflated. Subsequently, 75 mL of sterile Omnipaque 300 contrast was hand injected  via the catheter. Multiple overhead fluoroscopic images radiographs were taken. The patient tolerated the procedure well without discomfort. There was no immediate complication. An ileostomy bag was reapplied. COMPARISON:  CT abdomen/pelvis 05/27/2022. FLUOROSCOPY: Fluoroscopy time: 3 minutes, 36 seconds (31.30 mGy). FINDINGS: Satisfactory contrast opacification of the ileal conduit, ureters, renal pelves and renal calices. As demonstrated on the recent prior CT abdomen/pelvis of 05/27/2022, there is bilateral hydroureteronephrosis (severe right, moderate-to-severe left). No filling defect identified within the renal pelves, renal calices, ureters or ileal conduit. No stricture identified. IMPRESSION: Fluoroscopic loopogram, as described. Redemonstrated bilateral hydroureteronephrosis (severe right, moderate-to-severe left). No filling defect identified within the renal pelves, renal calices, ureters or ileal conduit. No stricture identified. Electronically Signed   By: Kellie Simmering D.O.   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  No significant findings on loopogram to account for her recent gross hematuria.  I agree with proceeding with CT urogram given improvement in renal function.  I visually inspected her stoma at the bedside today and could not find any gross abnormalities that would account for her bleeding, however I do think it would be prudent to have the wound and ostomy nurse examine this more closely if available.  While she does have gross hematuria, this does not appear to be severe enough to account for her recent drop in hemoglobin, in the absence of significant clots or passage of thick urine.  I think it would be prudent to consider alternative sources of her bleeding as well.  I discussed these findings  and recommendations with the patient and her son via speaker phone at the bedside this morning.  They expressed understanding.  Debroah Loop, PA-C 05/30/2022

## 2022-05-30 NOTE — Assessment & Plan Note (Signed)
Continue amlodipine, and add losartan 50 mg p.o. daily for better blood pressure control

## 2022-05-30 NOTE — Progress Notes (Addendum)
  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  However, patient was brought into the emergency room on 6/10 after a syncopal event while grocery shopping.  In emergency room, hemoglobin down to 9.8 (had been 13.80-monthprior) along with a white blood cell count of 18 as well as a UTI with hematuria.    6/12 loopogram not showing any stricture.  Unable to account for gross hematuria 6/13: CT urogram today.  W[place]villenurse and GI consult   Assessment and Plan: * Syncope and collapse Secondary to symptomatic anemia from ongoing hematuria Not having any further syncopal/dizzy spell.  Hematuria with ABLA (acute blood loss anemia) Acute versus chronic blood loss from urostomy History of bladder cancer s/p cystectomy with ileal conduit Hold aspirin and Plavix due to hematuria.  Transfuse if greater than two-point drop in 6 hours or if hemoglobin under 7.  loopogram showed no stricture.  Loopogram showing no significant findings to account for gross hematuria. CT urogram ordered for today..  Wound nurse to evaluate stoma Urology requesting GI consult to evaluate for GI bleed .  Urinary tract infection Continue IV Rocephin.  Urine cultures growing 100,000 colonies of E. coli.  Afebrile and leukocytosis now resolved.  History of bladder cancer, s/p cystectomy with ileal conduit Urology following.  CKD (chronic kidney disease), stage IIIb Renal function at baseline and remained stable  Hypertension Continue amlodipine, and add  losartan 50 mg p.o. daily for better blood pressure control  Constipation on CT Resolved  History of CVA s/p R ICA stent(cerebrovascular accident) Holding aspirin and Plavix due to hematuria.  Continue rosuvastatin  Hypokalemia Repleted and resolved        Subjective: Sitting in the bed.  Denies new symptoms.  Still draining red urine in the urostomy bag  Physical Exam: Vitals:   05/29/22 2348 05/30/22 0055 05/30/22 0446 05/30/22 0826  BP:  (!) 152/59 140/65 (!) 147/61  Pulse:  66 (!) 59 60  Resp: '19 20 18 16  '$ Temp:  98.4 F (36.9 C) 98.1 F (36.7 C) 97.8 F (36.6 C)  TempSrc:  Oral Oral   SpO2:  97% 97% 97%  Weight:      Height:       General: Alert and oriented x3, no acute distress HEENT: Normocephalic, atraumatic, mucous membranes are moist Cardiovascular: Regular rate and rhythm, S1-S2 Respiratory: Clear to auscultation bilaterally Abdomen: Soft, nontender, nondistended, positive bowel sounds.  Urostomy bag draining red urine Musculoskeletal: No clubbing or cyanosis or edema Skin: No skin breaks, tears or lesions Psychiatry: Appropriate, no evidence of psychoses Neurology: No focal deficits  Data Reviewed:  Loopogram showed stable bilateral hydro ureteral nephrosis but no filling defect  Family Communication: None  Disposition: Status is: Inpatient Remains inpatient appropriate because: Getting further urology and GI work-up for hematuria and anemia   Planned Discharge Destination: Home    DVT prophylaxis-SCDs Time spent: 35 minutes  Author: VMax Sane MD 05/30/2022 12:41 PM  For on call review [email or web address]

## 2022-05-30 NOTE — Assessment & Plan Note (Signed)
Urology following.

## 2022-05-30 NOTE — Assessment & Plan Note (Signed)
Continue IV Rocephin.  Urine cultures growing 100,000 colonies of E. coli.  Afebrile and leukocytosis now resolved.

## 2022-05-30 NOTE — Assessment & Plan Note (Signed)
Resolved

## 2022-05-30 NOTE — Assessment & Plan Note (Signed)
Secondary to symptomatic anemia from ongoing hematuria Not having any further syncopal/dizzy spell.

## 2022-05-31 ENCOUNTER — Other Ambulatory Visit: Payer: Self-pay | Admitting: Physician Assistant

## 2022-05-31 DIAGNOSIS — R310 Gross hematuria: Secondary | ICD-10-CM

## 2022-05-31 DIAGNOSIS — N2889 Other specified disorders of kidney and ureter: Secondary | ICD-10-CM

## 2022-05-31 DIAGNOSIS — N1832 Chronic kidney disease, stage 3b: Secondary | ICD-10-CM | POA: Diagnosis not present

## 2022-05-31 DIAGNOSIS — D62 Acute posthemorrhagic anemia: Secondary | ICD-10-CM | POA: Diagnosis not present

## 2022-05-31 DIAGNOSIS — Z8551 Personal history of malignant neoplasm of bladder: Secondary | ICD-10-CM

## 2022-05-31 DIAGNOSIS — R55 Syncope and collapse: Secondary | ICD-10-CM | POA: Diagnosis not present

## 2022-05-31 LAB — BASIC METABOLIC PANEL
Anion gap: 3 — ABNORMAL LOW (ref 5–15)
BUN: 22 mg/dL (ref 8–23)
CO2: 27 mmol/L (ref 22–32)
Calcium: 8.7 mg/dL — ABNORMAL LOW (ref 8.9–10.3)
Chloride: 109 mmol/L (ref 98–111)
Creatinine, Ser: 1.44 mg/dL — ABNORMAL HIGH (ref 0.44–1.00)
GFR, Estimated: 36 mL/min — ABNORMAL LOW (ref >60)
Glucose, Bld: 103 mg/dL — ABNORMAL HIGH (ref 70–99)
Potassium: 4 mmol/L (ref 3.5–5.1)
Sodium: 139 mmol/L (ref 135–145)

## 2022-05-31 LAB — CBC
HCT: 30.1 % — ABNORMAL LOW (ref 36.0–46.0)
Hemoglobin: 9.2 g/dL — ABNORMAL LOW (ref 12.0–15.0)
MCH: 27.2 pg (ref 26.0–34.0)
MCHC: 30.6 g/dL (ref 30.0–36.0)
MCV: 89.1 fL (ref 80.0–100.0)
Platelets: 363 10*3/uL (ref 150–400)
RBC: 3.38 MIL/uL — ABNORMAL LOW (ref 3.87–5.11)
RDW: 13.2 % (ref 11.5–15.5)
WBC: 6.3 10*3/uL (ref 4.0–10.5)
nRBC: 0 % (ref 0.0–0.2)

## 2022-05-31 MED ORDER — SODIUM CHLORIDE 0.9 % IV SOLN
INTRAVENOUS | Status: DC
Start: 2022-05-31 — End: 2022-05-31

## 2022-05-31 MED ORDER — CEFDINIR 300 MG PO CAPS: 300.0000 mg | ORAL_CAPSULE | Freq: Two times a day (BID) | ORAL | 0 refills | Status: DC

## 2022-05-31 NOTE — Clinical Social Work Note (Signed)
  Transition of Care [facility]) Screening Note   Patient Details  Name: Sara Aguirre Date of Birth: 1941-09-24   Transition of Care [facility]) CM/SW Contact:    Eileen Stanford, LCSW Phone [phone] 05/31/2022, 1:08 PM    Transition of Care Department [facility]) has reviewed patient and no TOC needs have been identified at this time. We will continue to monitor patient advancement through interdisciplinary progression rounds. If new patient transition needs arise, please place a TOC consult.

## 2022-05-31 NOTE — Progress Notes (Signed)
Attempted to reach son Sara Aguirre via telephone per patient request to provide update; LMOM to return my call.

## 2022-05-31 NOTE — Progress Notes (Signed)
Urology Inpatient Progress Note  Subjective: No acute events overnight.  She is afebrile, VSS. Creatinine up today, 1.44.  Hemoglobin stable, 9.2. CT urogram performed yesterday showed probable blood clots in the right renal collecting system as well as 2 enhancing solid lesions of the left upper renal pole measuring 1.3 and 1.2 cm concerning for RCC. Urostomy draining clear, red urine this morning.  Anti-infectives: Anti-infectives (From admission, onward)    Start     Dose/Rate Route Frequency Ordered Stop   05/30/22 1430  cefdinir (OMNICEF) capsule 300 mg        300 mg Oral Every 12 hours 05/30/22 1333 06/03/22 0959   05/28/22 1900  cefTRIAXone (ROCEPHIN) 1 g in sodium chloride 0.9 % 100 mL IVPB  Status:  Discontinued        1 g 200 mL/hr over 30 Minutes Intravenous Every 24 hours 05/28/22 0016 05/30/22 1333   05/27/22 1900  cefTRIAXone (ROCEPHIN) 1 g in sodium chloride 0.9 % 100 mL IVPB        1 g 200 mL/hr over 30 Minutes Intravenous  Once 05/27/22 1847 05/27/22 1919       Current Facility-Administered Medications  Medication Dose Route Frequency Provider Last Rate Last Admin   acetaminophen (TYLENOL) tablet 650 mg  650 mg Oral Q6H PRN Athena Masse, MD       Or   acetaminophen (TYLENOL) suppository 650 mg  650 mg Rectal Q6H PRN Athena Masse, MD       amLODipine (NORVASC) tablet 10 mg  10 mg Oral Daily Manuella Ghazi, Vipul, MD   10 mg at 05/30/22 0900   cefdinir (OMNICEF) capsule 300 mg  300 mg Oral Q12H Max Sane, MD   300 mg at 05/30/22 2207   docusate sodium (COLACE) capsule 100 mg  100 mg Oral BID Annita Brod, MD   100 mg at 05/30/22 2207   hydrALAZINE (APRESOLINE) injection 5 mg  5 mg Intravenous Q6H PRN Athena Masse, MD       losartan (COZAAR) tablet 50 mg  50 mg Oral Daily Max Sane, MD   50 mg at 05/30/22 1307   ondansetron (ZOFRAN) tablet 4 mg  4 mg Oral Q6H PRN Athena Masse, MD       Or   ondansetron [facility]) injection 4 mg  4 mg Intravenous Q6H PRN  Athena Masse, MD       polyethylene glycol (MIRALAX / GLYCOLAX) packet 17 g  17 g Oral Daily Gevena Barre K, MD   17 g at 05/30/22 0900   rosuvastatin (CRESTOR) tablet 5 mg  5 mg Oral Daily Judd Gaudier V, MD   5 mg at 05/30/22 0900   senna-docusate (Senokot-S) tablet 1 tablet  1 tablet Oral QHS PRN Athena Masse, MD       Objective: Vital signs in last 24 hours: Temp:  [97.8 F (36.6 C)-98.6 F (37 C)] 98.6 F (37 C) (06/14 0751) Pulse Rate:  [65-73] 65 (06/14 0751) Resp:  [16-19] 16 (06/14 0751) BP: (145-150)/(63-68) 147/64 (06/14 0751) SpO2:  [96 %-99 %] 97 % (06/14 0751)  Intake/Output from previous day: 06/13 0701 - 06/14 0700 In: 500 [P.O.:500] Out: 2200 [Urine:2200] Intake/Output this shift: Total I/O In: -  Out: 175 [Urine:175]  Physical Exam Vitals and nursing note reviewed.  Constitutional:      General: She is not in acute distress.    Appearance: She is not ill-appearing, toxic-appearing or diaphoretic.  HENT:  Head: Normocephalic and atraumatic.  Pulmonary:     Effort: Pulmonary effort is normal. No respiratory distress.  Skin:    General: Skin is warm and dry.  Neurological:     Mental Status: She is alert and oriented to person, place, and time.  Psychiatric:        Mood and Affect: Mood normal.        Behavior: Behavior normal.    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 RADIATION DOSE REDUCTION: This exam was performed according to the departmental dose-optimization program which includes automated exposure control, adjustment of the mA and/or kV according to patient size and/or use of iterative reconstruction technique. CONTRAST:  65m OMNIPAQUE IOHEXOL 300 MG/ML  SOLN COMPARISON:  CT abdomen and pelvis dated May 27, 2022 FINDINGS: Lower chest: No acute abnormality. Hepatobiliary: No focal liver abnormality is seen. No gallstones, gallbladder wall thickening, or biliary dilatation. Pancreas: Unremarkable. No pancreatic ductal dilatation or surrounding inflammatory changes. Spleen: Normal in size without focal abnormality. Adrenals/Urinary Tract: Bilateral adrenal glands are unremarkable. Lobulated contour of the bilateral kidneys, likely due to scarring. Enhancing solid lesion of the upper pole of the left kidney measuring up to 1.3 cm on series 4, image 17 and enhancing solid lesion of the upper pole the left kidney measuring up to 1.2 cm on image 18. Additional bilateral simple appearing renal cysts. Status post cystectomy with right lower quadrant ileal conduit urinary diversion. Severe right-greater-than-left hydronephrosis and hydroureter. On delayed imaging, only the renal calices are opacified. Multiple linear filling defects are seen within the right renal calices, concerning for blood clots. Stomach/Bowel: Stomach is within normal limits. No evidence of bowel wall thickening, distention, or inflammatory changes. Vascular/Lymphatic: Aortic atherosclerosis. No enlarged abdominal or pelvic lymph nodes. Reproductive: No adnexal masses. Other: No abdominal wall hernia or abnormality. No abdominopelvic ascites. Musculoskeletal: No acute or significant osseous findings. IMPRESSION: 1. Status post cystectomy with right lower quadrant ileal conduit urinary diversion. 2. Severe  right-greater-than-left bilateral hydronephrosis and hydroureter with multiple linear filling defects seen within the right renal calices, concerning for blood clots. 3. Enhancing solid lesions of the upper pole of the left kidney measuring 1.2 and 1.3 cm, concerning for RCC. 4. Aortic Atherosclerosis (ICD10-I70.0). Electronically Signed   By: LYetta GlassmanM.D.   On: 05/30/2022 13:46   DG Loopogram  Result Date: 05/29/2022 CLINICAL DATA:  Patient with prior cystectomy and right lower quadrant ileal conduit urinary diversion. Gross hematuria for 6 weeks. EXAM: LOOPOGRAM TECHNIQUE: The patient's ileostomy bag was removed. A 5 French hysterosalpingogram catheter was inserted into the ileostomy stoma and the balloon was partially inflated. Subsequently, 75 mL of sterile Omnipaque 300 contrast was hand injected via the catheter. Multiple overhead fluoroscopic images radiographs were taken. The patient  tolerated the procedure well without discomfort. There was no immediate complication. An ileostomy bag was reapplied. COMPARISON:  CT abdomen/pelvis 05/27/2022. FLUOROSCOPY: Fluoroscopy time: 3 minutes, 36 seconds (31.30 mGy). FINDINGS: Satisfactory contrast opacification of the ileal conduit, ureters, renal pelves and renal calices. As demonstrated on the recent prior CT abdomen/pelvis of 05/27/2022, there is bilateral hydroureteronephrosis (severe right, moderate-to-severe left). No filling defect identified within the renal pelves, renal calices, ureters or ileal conduit. No stricture identified. IMPRESSION: Fluoroscopic loopogram, as described. Redemonstrated bilateral hydroureteronephrosis (severe right, moderate-to-severe left). No filling defect identified within the renal pelves, renal calices, ureters or ileal conduit. No stricture identified. Electronically Signed   By: Kellie Simmering D.O.   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  Loopogram with no significant findings to account for hematuria, but CT urogram yesterday with 2 left upper pole renal lesions concerning for RCC and evidence of clots in the right renal collecting system.  We discussed today that her CT scan is concerning for cancer of the left kidney, however interestingly it appears that her right kidney may be the origin of her bleeding.  Fortunately, her hemoglobin remained stable and I do not think that there is any ongoing clinically significant bleeding.  At this point, I recommend outpatient referral to tertiary care center, namely [facility], for second opinion on this urologically complex patient.  I do not think she needs to be transferred on an inpatient basis given stable blood counts.  Debroah Loop, PA-C 05/31/2022

## 2022-05-31 NOTE — Discharge Summary (Signed)
Physician Discharge Summary   Patient: Sara Aguirre MRN: [id number] DOB: 11-17-1941  Admit date:     05/27/2022  Discharge date: 05/31/2022  Discharge Physician: Annita Brod   PCP: Pleas Koch, NP   Recommendations at discharge:   Patient given referral and will follow-up with Dr. Thurmond Butts of [facility] urology as outpatient Medication change: Patient will stop Keflex New medication: Omnicef 300 mg p.o. twice daily  Discharge Diagnoses: Active Problems:   Left renal mass   History of bladder cancer, s/p cystectomy with ileal conduit   CKD (chronic kidney disease), stage IIIb   Hypertension   History of CVA s/p R ICA stent(cerebrovascular accident)   Hypokalemia  Principal Problem (Resolved):   Syncope and collapse Resolved Problems:   Hematuria with ABLA (acute blood loss anemia)   Urinary tract infection   Constipation on CT  Hospital Course: 81 year old female with past medical history of bladder cancer status post cystectomy with ileal conduit, CVA status post right ICA stent on aspirin plus Plavix, stage IIIb chronic kidney disease and hypertension who for the past 6 weeks has been during bright red urostomy output and has been seen by urology with plans for outpatient loopogram and CT.  However, patient was brought into the emergency room on 6/10 after a syncopal event while grocery shopping.  In emergency room, hemoglobin down to 9.8 (had been 13.65-monthprior) along with a white blood cell count of 18 as well as a UTI with hematuria.    6/12 loopogram not showing any stricture.  Unable to account for gross hematuria.  CT urogram done on 6/13 noted evidence of clots in the right kidney and 2 upper pole renal lesions in the left kidney.  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  Given that bleeding was  stable, patient was [facility]   Assessment and Plan: * Syncope and collapse-resolved as of 06/01/2022 Secondary to symptomatic anemia from ongoing hematuria Not having any further syncopal/dizzy spell.  Blood pressure stabilized.  Hematuria with ABLA (acute blood loss anemia)-resolved as of 06/01/2022 Acute versus chronic blood loss from urostomy History of bladder cancer s/p cystectomy with ileal conduit Hold aspirin and Plavix due to hematuria.  Transfuse if greater than two-point drop in 6 hours or if hemoglobin under 7.  loopogram showed no stricture.  Loopogram showing no significant findings to account for gross hematuria. CT urogram to left renal lesions and left kidney and clots in the right kidney.  Bleeding stabilized and patient restarted on aspirin and Plavix and referred to tertiary care center as outpatient.  Gastroenterology has also been consulted when loopogram was unremarkable looking for GI source of bleeding.  With CT urogram results, gastroenterology felt no need for further work-up from GI standpoint.  Left renal mass X2.  Noted on CT.  Referred to tertiary urology  Urinary tract infection-resolved as of 06/01/2022 Continue IV Rocephin.  Urine cultures growing 100,000 colonies of E. coli.  Afebrile and leukocytosis now resolved.  Discharged on [place] History of bladder cancer, s/p cystectomy with ileal conduit Urology following.  Given concerns for renal cell carcinoma, as above.  CKD (chronic kidney disease), stage IIIb Renal function at baseline and remained stable.  Hypertension Continue amlodipine, and add losartan 50 mg p.o. daily during hospitalization  Constipation  on CT-resolved as of 06/01/2022 Resolved  History of CVA s/p R ICA stent(cerebrovascular accident) Holding aspirin and Plavix due to hematuria.  Continue rosuvastatin.  Aspirin Plavix resumed on discharge  Hypokalemia Repleted and  resolved         Consultants: Urology, gastroenterology Procedures performed: Loopogram and CT urogram Disposition: Home Diet recommendation:  Discharge Diet Orders (From admission, onward)     Start     Ordered   05/31/22 0000  Diet - low sodium heart healthy        05/31/22 1229           Cardiac diet DISCHARGE MEDICATION: Allergies as of 05/31/2022       Reactions   Imodium [loperamide]    Itchy all over   Loperamide Hcl Rash   Other    Also allergic to a "cough medicine" but patient does not remember the name        Medication List     STOP taking these medications    cephALEXin 500 MG capsule Commonly known as: KEFLEX   cyanocobalamin 1000 MCG tablet       TAKE these medications    amLODipine 10 MG tablet Commonly known as: NORVASC Take 1 tablet (10 mg total) by mouth daily. For blood pressure   aspirin EC 81 MG tablet Take 81 mg by mouth daily. Swallow whole.   Blood Pressure Monitor Devi Use to check blood pressure 3 times a week   cefdinir 300 MG capsule Commonly known as: OMNICEF Take 1 capsule (300 mg total) by mouth every 12 (twelve) hours.   clopidogrel 75 MG tablet Commonly known as: PLAVIX Take 1 tablet (75 mg total) by mouth daily. For stroke   dapagliflozin propanediol 10 MG Tabs tablet Commonly known as: FARXIGA Take 10 mg by mouth daily.   furosemide 20 MG tablet Commonly known as: LASIX Take 20 mg by mouth every Monday, Wednesday, and Friday.   losartan 100 MG tablet Commonly known as: COZAAR Take 100 mg by mouth daily.   rosuvastatin 5 MG tablet Commonly known as: Crestor Take 1 tablet (5 mg total) by mouth daily. For cholesterol.   UNABLE TO [place] [id number]        Follow-up Information     Raynor, Renaldo Harrison, MD. Schedule an appointment as soon as possible for a visit in 2 week(s).   Specialty: Urology Why: Urology here has sent Dr.Raynor's office the referral.  Call to schedule  appointment. Contact information: [id number] [place] [address] [phone]                Discharge Exam: Danley Danker Weights   05/27/22 1715 05/29/22 2246  Weight: 61.7 kg 62.3 kg   General: Alert and oriented x3, no acute distress Cardiovascular: Regular rate and rhythm, S1-S2 Lungs: Clear to auscultation bilaterally  Condition at discharge: improving  The results of significant diagnostics from this hospitalization (including imaging, microbiology, ancillary and laboratory) are listed below for reference.   Imaging Studies: CT HEMATURIA WORKUP  Result Date: 05/30/2022 CLINICAL DATA:  History of bladder cancer status post cystectomy with ileal conduit; * Tracking Code: BO * EXAM: CT ABDOMEN AND PELVIS WITHOUT AND WITH CONTRAST TECHNIQUE: Multidetector CT imaging of the abdomen and pelvis was performed following the standard protocol before and following the bolus administration of intravenous contrast. RADIATION DOSE REDUCTION: This exam was performed according to the departmental dose-optimization program which includes automated exposure control, adjustment  of the mA and/or kV according to patient size and/or use of iterative reconstruction technique. CONTRAST:  31m OMNIPAQUE IOHEXOL 300 MG/ML  SOLN COMPARISON:  CT abdomen and pelvis dated May 27, 2022 FINDINGS: Lower chest: No acute abnormality. Hepatobiliary: No focal liver abnormality is seen. No gallstones, gallbladder wall thickening, or biliary dilatation. Pancreas: Unremarkable. No pancreatic ductal dilatation or surrounding inflammatory changes. Spleen: Normal in size without focal abnormality. Adrenals/Urinary Tract: Bilateral adrenal glands are unremarkable. Lobulated contour of the bilateral kidneys, likely due to scarring. Enhancing solid lesion of the upper pole of the left kidney measuring up to 1.3 cm on series 4, image 17 and enhancing solid lesion of the upper pole the left  kidney measuring up to 1.2 cm on image 18. Additional bilateral simple appearing renal cysts. Status post cystectomy with right lower quadrant ileal conduit urinary diversion. Severe right-greater-than-left hydronephrosis and hydroureter. On delayed imaging, only the renal calices are opacified. Multiple linear filling defects are seen within the right renal calices, concerning for blood clots. Stomach/Bowel: Stomach is within normal limits. No evidence of bowel wall thickening, distention, or inflammatory changes. Vascular/Lymphatic: Aortic atherosclerosis. No enlarged abdominal or pelvic lymph nodes. Reproductive: No adnexal masses. Other: No abdominal wall hernia or abnormality. No abdominopelvic ascites. Musculoskeletal: No acute or significant osseous findings. IMPRESSION: 1. Status post cystectomy with right lower quadrant ileal conduit urinary diversion. 2. Severe right-greater-than-left bilateral hydronephrosis and hydroureter with multiple linear filling defects seen within the right renal calices, concerning for blood clots. 3. Enhancing solid lesions of the upper pole of the left kidney measuring 1.2 and 1.3 cm, concerning for RCC. 4. Aortic Atherosclerosis (ICD10-I70.0). Electronically Signed   By: LYetta GlassmanM.D.   On: 05/30/2022 13:46   DG Loopogram  Result Date: 05/29/2022 CLINICAL DATA:  Patient with prior cystectomy and right lower quadrant ileal conduit urinary diversion. Gross hematuria for 6 weeks. EXAM: LOOPOGRAM TECHNIQUE: The patient's ileostomy bag was removed. A 5 French hysterosalpingogram catheter was inserted into the ileostomy stoma and the balloon was partially inflated. Subsequently, 75 mL of sterile Omnipaque 300 contrast was hand injected via the catheter. Multiple overhead fluoroscopic images radiographs were taken. The patient tolerated the procedure well without discomfort. There was no immediate complication. An ileostomy bag was reapplied. COMPARISON:  CT  abdomen/pelvis 05/27/2022. FLUOROSCOPY: Fluoroscopy time: 3 minutes, 36 seconds (31.30 mGy). FINDINGS: Satisfactory contrast opacification of the ileal conduit, ureters, renal pelves and renal calices. As demonstrated on the recent prior CT abdomen/pelvis of 05/27/2022, there is bilateral hydroureteronephrosis (severe right, moderate-to-severe left). No filling defect identified within the renal pelves, renal calices, ureters or ileal conduit. No stricture identified. IMPRESSION: Fluoroscopic loopogram, as described. Redemonstrated bilateral hydroureteronephrosis (severe right, moderate-to-severe left). No filling defect identified within the renal pelves, renal calices, ureters or ileal conduit. No stricture identified. Electronically Signed   By: KKellie SimmeringD.O.   On: 05/29/2022 13:40   ECHOCARDIOGRAM COMPLETE  Result Date: 05/28/2022    ECHOCARDIOGRAM REPORT   Patient Name:   MKRITIKA STUKESDate of Exam: 05/28/2022 Medical Rec #:  [id number]     Height:       68.0 in Accession #:    [id number]    Weight:       136.0 lb Date of Birth:  1March 24, 1942     BSA:          1.735 m Patient Age:    89years       BP:  136/57 mmHg Patient Gender: F              HR:           79 bpm. Exam Location:  [facility] Procedure: 2D Echo Indications:     Syncope R55  History:         Patient has prior history of Echocardiogram examinations, most                  recent 10/09/2021.  Sonographer:     Kathlen Brunswick RDCS Referring Phys:  [id number] Athena Masse Diagnosing Phys: [place]  1. Left ventricular ejection fraction, by estimation, is 60 to 65%. The left ventricle has normal function. The left ventricle has no regional wall motion abnormalities. There is severe left ventricular hypertrophy. Left ventricular diastolic parameters  are consistent with Grade I diastolic dysfunction (impaired relaxation).  2. Right ventricular systolic function is normal. The right ventricular size is normal.  3. Left atrial  size was mildly dilated.  4. Right atrial size was mildly dilated.  5. The mitral valve is normal in structure. Trivial mitral valve regurgitation. No evidence of mitral stenosis.  6. The aortic valve is normal in structure. Aortic valve regurgitation is mild. No aortic stenosis is present.  7. The inferior vena cava is normal in size with greater than 50% respiratory variability, suggesting right atrial pressure of 3 mmHg. FINDINGS  Left Ventricle: Left ventricular ejection fraction, by estimation, is 60 to 65%. The left ventricle has normal function. The left ventricle has no regional wall motion abnormalities. The left ventricular internal cavity size was normal in size. There is  severe left ventricular hypertrophy. Left ventricular diastolic parameters are consistent with Grade I diastolic dysfunction (impaired relaxation). Right Ventricle: The right ventricular size is normal. No increase in right ventricular wall thickness. Right ventricular systolic function is normal. Left Atrium: Left atrial size was mildly dilated. Right Atrium: Right atrial size was mildly dilated. Pericardium: There is no evidence of pericardial effusion. Mitral Valve: The mitral valve is normal in structure. Trivial mitral valve regurgitation. No evidence of mitral valve stenosis. Tricuspid Valve: The tricuspid valve is normal in structure. Tricuspid valve regurgitation is mild . No evidence of tricuspid stenosis. Aortic Valve: The aortic valve is normal in structure. Aortic valve regurgitation is mild. No aortic stenosis is present. Aortic valve peak gradient measures 10.5 mmHg. Pulmonic Valve: The pulmonic valve was normal in structure. Pulmonic valve regurgitation is not visualized. No evidence of pulmonic stenosis. Aorta: The aortic root is normal in size and structure. Venous: The inferior vena cava is normal in size with greater than 50% respiratory variability, suggesting right atrial pressure of 3 mmHg. IAS/Shunts: No atrial  level shunt detected by color flow Doppler.  LEFT VENTRICLE PLAX 2D LVIDd:         4.77 cm     Diastology LVIDs:         3.11 cm     LV e' medial:    9.14 cm/s LV PW:         1.00 cm     LV E/e' medial:  9.2 LV IVS:        1.02 cm     LV e' lateral:   7.94 cm/s LVOT diam:     1.90 cm     LV E/e' lateral: 10.6 LV SV:         60 LV SV Index:   35 LVOT Area:     2.84  cm  LV Volumes (MOD) LV vol d, MOD A4C: 77.3 ml LV vol s, MOD A4C: 29.2 ml LV SV MOD A4C:     77.3 ml RIGHT VENTRICLE RV Basal diam:  3.19 cm RV S prime:     18.30 cm/s TAPSE (M-mode): 2.1 cm LEFT ATRIUM             Index        RIGHT ATRIUM           Index LA diam:        3.90 cm 2.25 cm/m   RA Area:     14.40 cm LA Vol (A2C):   45.4 ml 26.17 ml/m  RA Volume:   34.40 ml  19.83 ml/m LA Vol (A4C):   32.8 ml 18.91 ml/m LA Biplane Vol: 39.9 ml 23.00 ml/m  AORTIC VALVE                 PULMONIC VALVE AV Area (Vmax): 2.14 cm     PV Vmax:       1.06 m/s AV Vmax:        162.00 cm/s  PV Peak grad:  4.5 mmHg AV Peak Grad:   10.5 mmHg LVOT Vmax:      122.00 cm/s LVOT Vmean:     77.200 cm/s LVOT VTI:       0.212 m  AORTA Ao Root diam: 3.30 cm MITRAL VALVE                TRICUSPID VALVE MV Area (PHT): 3.46 cm     TV Peak grad:   24.6 mmHg MV Decel Time: 219 msec     TV Vmax:        2.48 m/s MV E velocity: 84.00 cm/s MV A velocity: 105.00 cm/s  SHUNTS MV E/A ratio:  0.80         Systemic VTI:  0.21 m                             Systemic Diam: 1.90 cm Neoma Laming Electronically signed by Neoma Laming Signature Date/Time: 05/28/2022/2:44:19 PM    Final    US Carotid Bilateral  Result Date: 05/27/2022 CLINICAL DATA:  Syncope EXAM: BILATERAL CAROTID DUPLEX ULTRASOUND TECHNIQUE: Pearline Cables scale imaging, color Doppler and duplex ultrasound were performed of bilateral carotid and vertebral arteries in the neck. COMPARISON:  None Available. FINDINGS: Criteria: Quantification of carotid stenosis is based on velocity parameters that correlate the residual internal carotid  diameter with NASCET-based stenosis levels, using the diameter of the distal internal carotid lumen as the denominator for stenosis measurement. The following velocity measurements were obtained: RIGHT ICA: 91/26 cm/sec CCA: 42/59 cm/sec SYSTOLIC ICA/CCA RATIO:  2 ECA:  173 cm/sec LEFT ICA: 100/14 cm/sec CCA: 56/3 cm/sec SYSTOLIC ICA/CCA RATIO:  2.1 ECA:  88 cm/sec RIGHT CAROTID ARTERY: Trace heterogeneous atherosclerotic plaque in the proximal internal carotid artery. By peak systolic velocity criteria there is no significant stenosis. RIGHT VERTEBRAL ARTERY:  Patent with antegrade flow. LEFT CAROTID ARTERY: Trace smooth heterogeneous atherosclerotic plaque. By peak systolic velocity criteria, the estimated stenosis is less than 50%. LEFT VERTEBRAL ARTERY:  Patent with normal antegrade flow. IMPRESSION: 1. Mild (1-49%) stenosis proximal right internal carotid artery secondary to mild smooth heterogeneous atherosclerotic plaque. 2. Mild (1-49%) stenosis proximal left internal carotid artery secondary to mild smooth heterogeneous atherosclerotic plaque. 3. Vertebral arteries are patent with normal antegrade flow. Electronically Signed   By:  Jacqulynn Cadet M.D.   On: 05/27/2022 23:03   CT HEAD WO CONTRAST (5MM)  Result Date: 05/27/2022 CLINICAL DATA:  Syncope/presyncope, cerebrovascular cause suspected EXAM: CT HEAD WITHOUT CONTRAST TECHNIQUE: Contiguous axial images were obtained from the base of the skull through the vertex without intravenous contrast. RADIATION DOSE REDUCTION: This exam was performed according to the departmental dose-optimization program which includes automated exposure control, adjustment of the mA and/or kV according to patient size and/or use of iterative reconstruction technique. 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 There is atrophy and chronic small vessel disease changes. No acute intracranial abnormality. Specifically,  no hemorrhage, hydrocephalus, mass lesion, acute infarction, or significant intracranial injury. Vascular: No hyperdense vessel or unexpected calcification. Skull: No acute calvarial abnormality. Sinuses/Orbits: No acute findings Other: None IMPRESSION: Old right posterior parietal infarct. Atrophy, chronic microvascular disease. No acute intracranial abnormality. Electronically Signed   By: Rolm Baptise M.D.   On: 05/27/2022 22:59   CT ABDOMEN PELVIS WO CONTRAST  Result Date: 05/27/2022 CLINICAL DATA:  Syncope, flank pain, vomiting, history of bladder cancer status post cystectomy and ileal conduit urinary diversion EXAM: CT ABDOMEN AND PELVIS WITHOUT CONTRAST TECHNIQUE: Multidetector CT imaging of the abdomen and pelvis was performed following the standard protocol without IV contrast. RADIATION DOSE REDUCTION: This exam was performed according to the departmental dose-optimization program which includes automated exposure control, adjustment of the mA and/or kV according to patient size and/or use of iterative reconstruction technique. COMPARISON:  12/04/2011 FINDINGS: Lower chest: No acute abnormality.  Coronary artery calcifications. Hepatobiliary: No solid liver abnormality is seen. No gallstones, gallbladder wall thickening, or biliary dilatation. Pancreas: Unremarkable. No pancreatic ductal dilatation or surrounding inflammatory changes. Spleen: Normal in size without significant abnormality. Adrenals/Urinary Tract: Adrenal glands are unremarkable. Status post cystectomy with right lower quadrant ileal conduit urinary diversion. Severe bilateral hydronephrosis and hydroureter to the ileal conduit, appearance and configuration not significant changed compared to prior examination dated 2012. Extensive, lobular renal cortical scarring. Stomach/Bowel: Stomach is within normal limits. Appendix appears normal. No evidence of bowel wall thickening, distention, or inflammatory changes. Large burden of stool in  the distal colon and rectum, rectal stool ball measuring at least 10.0 cm (series 6, image 61). Vascular/Lymphatic: Aortic atherosclerosis. No enlarged abdominal or pelvic lymph nodes. Reproductive: Status post hysterectomy. Other: Right lower quadrant ileal conduit urinary diversion. Previously seen parastomal hernia is reduced (series 2, image 32). No ascites. Musculoskeletal: No acute or significant osseous findings. IMPRESSION: 1. No acute noncontrast CT findings of the abdomen or pelvis. 2. Status post cystectomy with right lower quadrant ileal conduit urinary diversion. Severe bilateral hydronephrosis and hydroureter to the ileal conduit, appearance and configuration not significantly changed compared to prior examination dated 2012. Extensive, lobular renal cortical scarring. 3. Large burden of stool in the distal colon and rectum, rectal stool ball measuring at least 10.0 cm. Correlate for fecal impaction. 4. Coronary artery disease. Aortic Atherosclerosis (ICD10-I70.0). Electronically Signed   By: Delanna Ahmadi M.D.   On: 05/27/2022 19:07    Microbiology: Results for orders placed or performed during the hospital encounter of 05/27/22  Urine Culture     Status: Abnormal   Collection Time: 05/27/22  5:59 PM   Specimen: Urine, Clean Catch  Result Value Ref Range Status   Specimen Description   Final    URINE, CLEAN CATCH Performed at [facility], [address] [place], [place] [address]    Special Requests   Final  NONE Performed at [facility], [address] [place], [place] [address]    Culture >=100,000 COLONIES/mL ESCHERICHIA COLI (A)  Final   Report Status 05/30/2022 FINAL  Final   Organism ID, Bacteria ESCHERICHIA COLI (A)  Final      Susceptibility   Escherichia coli - MIC*    AMPICILLIN 8 SENSITIVE Sensitive     CEFAZOLIN <=4 SENSITIVE Sensitive     CEFEPIME <=0.12 SENSITIVE Sensitive     CEFTRIAXONE <=0.25 SENSITIVE Sensitive     CIPROFLOXACIN  <=0.25 SENSITIVE Sensitive     GENTAMICIN <=1 SENSITIVE Sensitive     IMIPENEM <=0.25 SENSITIVE Sensitive     NITROFURANTOIN <=16 SENSITIVE Sensitive     TRIMETH/SULFA <=20 SENSITIVE Sensitive     AMPICILLIN/SULBACTAM 4 SENSITIVE Sensitive     PIP/TAZO <=4 SENSITIVE Sensitive     * >=100,000 COLONIES/mL ESCHERICHIA COLI    Labs: CBC: Recent Labs  Lab 05/27/22 1725 05/27/22 2322 05/28/22 0534 05/29/22 0531 05/30/22 0514 05/31/22 0549  WBC 17.9*  --   --  6.8 6.3 6.3  HGB 9.8* 9.5* 9.0* 9.0* 8.9* 9.2*  HCT 31.7*  --   --  29.2* 28.3* 30.1*  MCV 90.8  --   --  88.8 89.0 89.1  PLT 349  --   --  328 334 604   Basic Metabolic Panel: Recent Labs  Lab 05/27/22 1725 05/29/22 0531 05/30/22 0514 05/31/22 0549  NA 135 141 140 139  K 3.4* 3.2* 3.6 4.0  CL 104 112* 112* 109  CO2 '24 24 25 27  '$ GLUCOSE 153* 95 101* 103*  BUN '23 18 17 22  '$ CREATININE 1.52* 1.23* 1.17* 1.44*  CALCIUM 8.8* 8.7* 8.5* 8.7*  MG  --  2.2  --   --    Liver Function Tests: No results for input(s): "AST", "ALT", "ALKPHOS", "BILITOT", "PROT", "ALBUMIN" in the last 168 hours. CBG: No results for input(s): "GLUCAP" in the last 168 hours.  Discharge time spent: less than 30 minutes.  Signed: Annita Brod, MD [facility] 06/01/2022

## 2022-05-31 NOTE — Progress Notes (Signed)
Son Argentina Ponder updated via telephone.

## 2022-05-31 NOTE — Progress Notes (Signed)
Noted CT scan findings significant for  Severe right-greater-than-left bilateral hydronephrosis and hydroureter with multiple linear filling defects seen within the right renal calices, concerning for blood clots. Enhancing solid lesions of the upper pole of the left kidney measuring 1.2 and 1.3 cm, concerning for RCC.  GI will sign off at this time, please call us back with questions or concerns  Sherri Sear, MD

## 2022-06-01 DIAGNOSIS — N2889 Other specified disorders of kidney and ureter: Secondary | ICD-10-CM | POA: Diagnosis present

## 2022-06-01 NOTE — Assessment & Plan Note (Signed)
Holding aspirin and Plavix due to hematuria.  Continue rosuvastatin.  Aspirin Plavix resumed on discharge

## 2022-06-01 NOTE — Assessment & Plan Note (Signed)
Renal function at baseline and remained stable.

## 2022-06-01 NOTE — Assessment & Plan Note (Signed)
X2.  Noted on CT.  Referred to tertiary urology

## 2022-06-01 NOTE — Assessment & Plan Note (Signed)
Resolved

## 2022-06-01 NOTE — Assessment & Plan Note (Signed)
Secondary to symptomatic anemia from ongoing hematuria Not having any further syncopal/dizzy spell.  Blood pressure stabilized.

## 2022-06-01 NOTE — Assessment & Plan Note (Signed)
Continue IV Rocephin.  Urine cultures growing 100,000 colonies of E. coli.  Afebrile and leukocytosis now resolved.  Discharged on [place]

## 2022-06-01 NOTE — Assessment & Plan Note (Addendum)
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  Transfuse if greater than two-point drop in 6 hours or if hemoglobin under 7.  loopogram showed no stricture.  Loopogram showing no significant findings to account for gross hematuria. CT urogram to left renal lesions and left kidney and clots in the right kidney.  Bleeding stabilized and patient restarted on aspirin and Plavix and referred to tertiary care center as outpatient.  Gastroenterology has also been consulted when loopogram was unremarkable looking for GI source of bleeding.  With CT urogram results, gastroenterology felt no need for further work-up from GI standpoint.

## 2022-06-01 NOTE — Assessment & Plan Note (Signed)
Continue amlodipine, and add losartan 50 mg p.o. daily during hospitalization

## 2022-06-01 NOTE — Assessment & Plan Note (Signed)
Urology following.  Given concerns for renal cell carcinoma, as above.

## 2022-06-02 ENCOUNTER — Encounter: Payer: Self-pay | Admitting: Urology

## 2022-06-08 ENCOUNTER — Ambulatory Visit (INDEPENDENT_AMBULATORY_CARE_PROVIDER_SITE_OTHER): Payer: PPO | Admitting: Nurse Practitioner

## 2022-06-08 ENCOUNTER — Encounter (INDEPENDENT_AMBULATORY_CARE_PROVIDER_SITE_OTHER): Payer: PPO

## 2022-06-11 ENCOUNTER — Encounter: Payer: Self-pay | Admitting: Urology

## 2022-06-23 ENCOUNTER — Telehealth: Payer: Self-pay

## 2022-06-23 DIAGNOSIS — R310 Gross hematuria: Secondary | ICD-10-CM | POA: Diagnosis not present

## 2022-06-23 DIAGNOSIS — Z9889 Other specified postprocedural states: Secondary | ICD-10-CM | POA: Diagnosis not present

## 2022-06-23 DIAGNOSIS — N2889 Other specified disorders of kidney and ureter: Secondary | ICD-10-CM | POA: Diagnosis not present

## 2022-06-23 DIAGNOSIS — N390 Urinary tract infection, site not specified: Secondary | ICD-10-CM | POA: Diagnosis not present

## 2022-06-23 DIAGNOSIS — T83510A Infection and inflammatory reaction due to cystostomy catheter, initial encounter: Secondary | ICD-10-CM | POA: Diagnosis not present

## 2022-06-23 DIAGNOSIS — Z8551 Personal history of malignant neoplasm of bladder: Secondary | ICD-10-CM | POA: Diagnosis not present

## 2022-06-23 NOTE — Telephone Encounter (Signed)
Called and left detailed vm for patient to call us back regarding arranging an appt for surgery clearance.

## 2022-06-23 NOTE — Telephone Encounter (Signed)
Is she pending surgery? What type?  I do not mind evaluating her for a medical/cardiac surgical clearance.  She will need an appointment.

## 2022-06-23 NOTE — Telephone Encounter (Signed)
[facility], Dr.Freelander's assistant called in, they are wanting to know if Anda Kraft would be okay with clearing the patient Cardio or if she would like for them to refer her to cardiology.

## 2022-06-26 ENCOUNTER — Telehealth: Payer: Self-pay

## 2022-06-26 NOTE — Telephone Encounter (Signed)
I received FMLA forms for Ms Storlie from her son, Argentina Ponder.  Per his note on page 2, Ms Pyles needs to have surgery and he needs these forms filled out for intermittent FMLA.  I left a vmail for Mr Zachery Conch to call me back to get more details about FMLA leave, such as when Ms [facility] surgery will be, dates of leave, etc.  I placed the forms in your inbox for completion.  The form says it is due by 06/21/22, but we did not receive it until today, 06/26/22.

## 2022-06-28 NOTE — Telephone Encounter (Signed)
Completed forms and placed on Kate's desk.

## 2022-06-29 NOTE — Telephone Encounter (Signed)
FMLA form was faxed to F# [phone].  Received fax confirmation.  Made copy for myself, Argentina Ponder, and scanning.  Left a voice mail for Argentina Ponder that this from is ready to be picked up at his convenience.

## 2022-07-04 NOTE — Telephone Encounter (Signed)
Spoke with patient regarding scheduling and told me to reach out to her son Elta Guadeloupe. Called and LVM for him to give [place] a call back.

## 2022-07-11 ENCOUNTER — Ambulatory Visit (INDEPENDENT_AMBULATORY_CARE_PROVIDER_SITE_OTHER): Payer: PPO | Admitting: Primary Care

## 2022-07-11 ENCOUNTER — Encounter: Payer: Self-pay | Admitting: Primary Care

## 2022-07-11 VITALS — BP 142/74 | HR 78 | Temp 97.6°F | Ht 68.0 in | Wt 140.0 lb

## 2022-07-11 DIAGNOSIS — Z01818 Encounter for other preprocedural examination: Secondary | ICD-10-CM

## 2022-07-11 DIAGNOSIS — N1832 Chronic kidney disease, stage 3b: Secondary | ICD-10-CM | POA: Diagnosis not present

## 2022-07-11 DIAGNOSIS — Z8551 Personal history of malignant neoplasm of bladder: Secondary | ICD-10-CM | POA: Diagnosis not present

## 2022-07-11 DIAGNOSIS — N2889 Other specified disorders of kidney and ureter: Secondary | ICD-10-CM

## 2022-07-11 NOTE — Patient Instructions (Signed)
Stop by the lab prior to leaving today. I will notify you of your results once received.   It was a pleasure to see you today!  

## 2022-07-11 NOTE — Progress Notes (Signed)
Subjective:    Patient ID: Sara Aguirre, female    DOB: 1941/11/08, 81 y.o.   MRN: [id number]  HPI  Sara Aguirre is a very pleasant 81 y.o. female with a history of hypertension, thyroid nodule, CKD, cystitis, bladder cancer, CVA, hematuria, proteinuria who presents today for preoperative clearance. Her son joins [place] today.   Admitted to [facility] hospital on 05/27/22 for gross hematuria with subsequent syncope. She underwent hematuria CT scan which revealed severe right greater than left bilateral hydronephrosis and hydroureter with multiple linear filling defects within the right renal calyces. There is also note of a enhancing solid lesion of the the upper pole of the left kidney.   Currently following with Urology through [place]. Evaluated on 06/23/22 per Dr. Veronda Prude for evaluation of recently noted left renal mass and right collecting system clots. Dr. Veronda Prude recommended antegrade ureteroscopy on the right side to rule out urethral cancer.  She is here today for procedural clearance.  She was referred to nephrology for partial nephrectomy. This will be separate procedure for which will be done at later date.  Today she denies concerns. She denies gross hematuria, fatigue, fevers, weakness.    Review of Systems  Constitutional:  Negative for unexpected weight change.  HENT:  Negative for rhinorrhea.   Respiratory:  Negative for cough and shortness of breath.   Cardiovascular:  Negative for chest pain.  Gastrointestinal:  Negative for constipation and diarrhea.  Genitourinary:  Negative for difficulty urinating and hematuria.  Musculoskeletal:  Negative for arthralgias and myalgias.  Skin:  Negative for rash.  Neurological:  Negative for dizziness and headaches.         Past Medical History:  Diagnosis Date   Cancer [facility])    bladder, s/p cystectomy with ileal conduit   History of bladder cancer    History of tobacco use    Hyperlipidemia    Hypertension     Incarcerated ventral hernia, s/p lap repair 12/05/2011   Parastomal hernia of ileal conduit, s/p lap repair [id number] 12/04/2011   Ulcer, gastric, acute or chronic     Social History   Socioeconomic History   Marital status: Widowed    Spouse name: Not on file   Number of children: 2   Years of education: Not on file   Highest education level: Some college, no degree  Occupational History   Occupation: retired  Tobacco Use   Smoking status: Former    Types: Cigarettes    Quit date: 01/02/1993    Years since quitting: 29.5   Smokeless tobacco: Never  Vaping Use   Vaping Use: Never used  Substance and Sexual Activity   Alcohol use: No   Drug use: No   Sexual activity: Not on file  Other Topics Concern   Not on file  Social History Narrative   Marital status: widowed since 1992; not dating; not interested      Children:  Twin boys (80); 2 grandchildren      Lives: alone with dog, 5 chickens, 2 rabbits      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  Gardening      ADLs: drives; independent with ADLs; has garden      Advanced Directives: YES: full code but no prolonged measures; HCPOA: Gerald Stabs  Ballard/son or other son Cory Munch; copy on chart 04/2017.   Social Determinants of Health   Financial Resource Strain: Low Risk  (03/22/2022)   Overall Financial Resource Strain (CARDIA)    Difficulty of Paying Living Expenses: Not hard at all  Food Insecurity: No Food Insecurity (03/22/2022)   Hunger Vital Sign    Worried About Running Out of Food in the Last Year: Never true    Ran Out of Food in the Last Year: Never true  Transportation Needs: No Transportation Needs (03/22/2022)   PRAPARE - Hydrologist (Medical): No    Lack of Transportation (Non-Medical): No  Physical Activity: Insufficiently Active  (03/22/2022)   Exercise Vital Sign    Days of Exercise per Week: 3 days    Minutes of Exercise per Session: 30 min  Stress: No Stress Concern Present (03/22/2022)   [place]    Feeling of Stress : Not at all  Social Connections: Socially Isolated (03/22/2022)   Social Connection and Isolation Panel [NHANES]    Frequency of Communication with Friends and Family: More than three times a week    Frequency of Social Gatherings with Friends and Family: More than three times a week    Attends Religious Services: Never    Marine scientist or Organizations: No    Attends Archivist Meetings: Never    Marital Status: Widowed  Intimate Partner Violence: Not At Risk (03/22/2022)   Humiliation, Afraid, Rape, and Kick questionnaire    Fear of Current or Ex-Partner: No    Emotionally Abused: No    Physically Abused: No    Sexually Abused: No    Past Surgical History:  Procedure Laterality Date   BLADDER REMOVAL  10/1993   Dr. Risa Grill   CAROTID PTA/STENT INTERVENTION Right 11/08/2021   Procedure: CAROTID PTA/STENT INTERVENTION;  Surgeon: Katha Cabal, MD;  Location: [facility];  Service: Cardiovascular;  Laterality: Right;   HERNIA REPAIR  12/05/11   parastomal   LOOP RECORDER INSERTION N/A 04/23/2020   Procedure: LOOP RECORDER INSERTION;  Surgeon: Deboraha Sprang, MD;  Location: [facility];  Service: Cardiovascular;  Laterality: N/A;   PARASTOMAL HERNIA REPAIR  12/05/2011   Procedure: HERNIA REPAIR PARASTOMAL;  Surgeon: Adin Hector, MD;  Location: [facility];  Service: General;;  Laprascopic lysis of adhestons with reduction and repair with biological mesh for incarcerated parastomal and ventral long incisional hernia.   REVISION UROSTOMY CUTANEOUS     VENTRAL HERNIA REPAIR  12/05/2011   Procedure: HERNIA REPAIR VENTRAL ADULT;  Surgeon: Adin Hector, MD;  Location: [facility];  Service: General;;     Family History  Problem Relation Age of Onset   Heart disease Mother 43       CHF   Alcohol abuse Father    Stroke Father     Allergies  Allergen Reactions   Imodium [Loperamide]     Itchy all over   Loperamide Hcl Rash   Other     Also allergic to a "cough medicine" but patient does not remember the name    Current Outpatient Medications on File Prior to Visit  Medication Sig Dispense Refill   amLODipine (NORVASC) 10 MG tablet Take 1 tablet (10 mg total) by mouth daily. For blood pressure 90 tablet 3   aspirin EC 81 MG tablet Take 81 mg by mouth daily. Swallow whole.  clopidogrel (PLAVIX) 75 MG tablet Take 1 tablet (75 mg total) by mouth daily. For stroke 90 tablet 3   dapagliflozin propanediol (FARXIGA) 10 MG TABS tablet Take 10 mg by mouth daily.     furosemide (LASIX) 20 MG tablet Take 20 mg by mouth every Monday, Wednesday, and Friday.     losartan (COZAAR) 100 MG tablet Take 100 mg by mouth daily.     rosuvastatin (CRESTOR) 5 MG tablet Take 1 tablet (5 mg total) by mouth daily. For cholesterol. 90 tablet 3   UNABLE TO FIND HOLISTER UROSTOMY POUCHES ITEM 8478 100 Device 3   No current facility-administered medications on file prior to visit.    BP (!) 142/74   Pulse 78   Temp 97.6 F (36.4 C) (Oral)   Ht '5\' 8"'$  (1.727 m)   Wt 140 lb (63.5 kg)   SpO2 98%   BMI 21.29 kg/m  Objective:   Physical Exam HENT:     Right Ear: Tympanic membrane and ear canal normal.     Left Ear: Tympanic membrane and ear canal normal.  Eyes:     Extraocular Movements: Extraocular movements intact.  Cardiovascular:     Rate and Rhythm: Normal rate and regular rhythm.  Pulmonary:     Effort: Pulmonary effort is normal.     Breath sounds: Normal breath sounds.  Abdominal:     General: Bowel sounds are normal.     Palpations: Abdomen is soft.     Tenderness: There is no abdominal tenderness.  Musculoskeletal:        General: Normal range of motion.     Cervical back: Neck  supple.  Skin:    General: Skin is warm and dry.  Neurological:     Mental Status: She is alert and oriented to person, place, and time.     Cranial Nerves: No cranial nerve deficit.  Psychiatric:        Mood and Affect: Mood normal.           Assessment & Plan:   Problem List Items Addressed This Visit       Genitourinary   CKD (chronic kidney disease), stage IIIb   Relevant Orders   CBC   Comprehensive metabolic panel     Other   History of bladder cancer, s/p cystectomy with ileal conduit    Patient pending antegrade uteroscopy per urology through [facility]. Reviewed office notes from July 2023 through care everywhere.  Reviewed CT hematuria scan from June 2023.         Relevant Orders   CBC   Comprehensive metabolic panel   Left renal mass    Pending nephrology evaluation.  Reviewed CT hematuria scan from June 2023 during hospital visit.      Preoperative clearance - Primary    Pending antegrade uteroscopy per urology for right collecting system clots. Reviewed office notes from neurology through care everywhere from July 2023.  Reviewed CT hematuria scan from June 2023.  ECG today with rate of 66. No PAC/PVC, no acute ST changes. Appears very similar to ECG from 2018.  Repeat CMP and CBC pending. If labs are stable then okay to clear for procedure.       Relevant Orders   EKG 12-Lead (Completed)       Pleas Koch, NP

## 2022-07-11 NOTE — Assessment & Plan Note (Signed)
Patient pending antegrade uteroscopy per urology through [facility]. Reviewed office notes from July 2023 through care everywhere.  Reviewed CT hematuria scan from June 2023.

## 2022-07-11 NOTE — Assessment & Plan Note (Addendum)
Pending antegrade uteroscopy per urology for right collecting system clots. Reviewed office notes from neurology through care everywhere from July 2023.  Reviewed CT hematuria scan from June 2023.  ECG today with rate of 66. No PAC/PVC, no acute ST changes. Appears very similar to ECG from 2018.  Repeat CMP and CBC pending. If labs are stable then okay to clear for procedure.

## 2022-07-11 NOTE — Assessment & Plan Note (Signed)
Pending nephrology evaluation.  Reviewed CT hematuria scan from June 2023 during hospital visit.

## 2022-07-12 LAB — COMPREHENSIVE METABOLIC PANEL WITH GFR
ALT: 10 U/L (ref 0–35)
AST: 19 U/L (ref 0–37)
Albumin: 4.1 g/dL (ref 3.5–5.2)
Alkaline Phosphatase: 70 U/L (ref 39–117)
BUN: 19 mg/dL (ref 6–23)
CO2: 28 meq/L (ref 19–32)
Calcium: 9 mg/dL (ref 8.4–10.5)
Chloride: 104 meq/L (ref 96–112)
Creatinine, Ser: 1.69 mg/dL — ABNORMAL HIGH (ref 0.40–1.20)
GFR: 27.76 mL/min — ABNORMAL LOW
Glucose, Bld: 93 mg/dL (ref 70–99)
Potassium: 4.2 meq/L (ref 3.5–5.1)
Sodium: 138 meq/L (ref 135–145)
Total Bilirubin: 0.3 mg/dL (ref 0.2–1.2)
Total Protein: 7.3 g/dL (ref 6.0–8.3)

## 2022-07-12 LAB — CBC
HCT: 31.7 % — ABNORMAL LOW (ref 36.0–46.0)
Hemoglobin: 10 g/dL — ABNORMAL LOW (ref 12.0–15.0)
MCHC: 31.4 g/dL (ref 30.0–36.0)
MCV: 81.7 fl (ref 78.0–100.0)
Platelets: 319 10*3/uL (ref 150.0–400.0)
RBC: 3.88 Mil/uL (ref 3.87–5.11)
RDW: 15.2 % (ref 11.5–15.5)
WBC: 8.5 10*3/uL (ref 4.0–10.5)

## 2022-08-04 ENCOUNTER — Other Ambulatory Visit: Payer: Self-pay | Admitting: Primary Care

## 2022-08-04 DIAGNOSIS — E782 Mixed hyperlipidemia: Secondary | ICD-10-CM

## 2022-08-28 ENCOUNTER — Telehealth: Payer: Self-pay | Admitting: Primary Care

## 2022-08-28 NOTE — Telephone Encounter (Signed)
Received pre-op clearance request from [facility]. Patient last seen for clearance on 07/11/2022. Form placed in your box for review. Do you need to see patient in office before completion?

## 2022-08-28 NOTE — Telephone Encounter (Signed)
Thanks.  I completed a preoperative clearance visit in July 2023 so no need to bring patient back. Please hand to Dr. Diona Browner for completion in my absence.   Amy, can you take a look at my most recent office notes and sign?

## 2022-09-05 ENCOUNTER — Telehealth: Payer: Self-pay | Admitting: Primary Care

## 2022-09-05 NOTE — Telephone Encounter (Signed)
[facility] called stating that the patient isn't having surgery anymore ,so theres no need for the cardiac clearance that was requested to be faxed over

## 2022-09-05 NOTE — Telephone Encounter (Signed)
[facility] is re-faxing the pre-op clearance form.

## 2022-09-05 NOTE — Telephone Encounter (Signed)
I have not seen a preop form. Joellen, can do you have a preop form for this patient?

## 2022-09-06 ENCOUNTER — Telehealth: Payer: Self-pay | Admitting: Primary Care

## 2022-09-06 NOTE — Telephone Encounter (Signed)
Duplicate message already addressed. No further action

## 2022-09-06 NOTE — Telephone Encounter (Signed)
Will you guys put this on my desk once you see it?

## 2022-09-06 NOTE — Telephone Encounter (Signed)
Pauletta from [facility] called and stated that the patient stated that she don't wont to go with future treatment. Call back [phone].

## 2022-09-06 NOTE — Telephone Encounter (Signed)
See additional encounter for further documentation.

## 2022-09-06 NOTE — Telephone Encounter (Signed)
Noted  

## 2022-09-06 NOTE — Telephone Encounter (Signed)
[facility] called stating that the patient isn't having surgery anymore, so theres no need for the cardiac clearance that was requested to be faxed over.

## 2022-09-21 ENCOUNTER — Other Ambulatory Visit: Payer: Self-pay | Admitting: Primary Care

## 2022-09-21 DIAGNOSIS — I10 Essential (primary) hypertension: Secondary | ICD-10-CM

## 2022-10-04 NOTE — Telephone Encounter (Signed)
[employer] faxed an old order. Have requested patient to get [employer] to fax a new order for Sara Aguirre to sign.

## 2022-10-04 NOTE — Telephone Encounter (Signed)
Spoke with Elta Guadeloupe, patients son and informed him that he will need to request from the supplier and from there they will send [place] the order to approve. Gave him office fax number for supplier.

## 2022-10-09 NOTE — Telephone Encounter (Signed)
Spoke with patients son Elta Guadeloupe, informed him we have not received a new supply request.

## 2022-10-09 NOTE — Telephone Encounter (Signed)
Pt son called in wants to know status of orders did we receive fax for new supplier request . Please advise #[phone]

## 2022-10-12 DIAGNOSIS — Z936 Other artificial openings of urinary tract status: Secondary | ICD-10-CM | POA: Diagnosis not present

## 2022-10-16 ENCOUNTER — Encounter (INDEPENDENT_AMBULATORY_CARE_PROVIDER_SITE_OTHER): Payer: Self-pay

## 2022-11-02 ENCOUNTER — Other Ambulatory Visit: Payer: Self-pay

## 2022-11-02 ENCOUNTER — Encounter: Payer: Self-pay | Admitting: Emergency Medicine

## 2022-11-02 ENCOUNTER — Emergency Department
Admission: EM | Admit: 2022-11-02 | Discharge: 2022-11-03 | Disposition: A | Discharge status: Home / Self Care | Payer: PPO | Attending: Emergency Medicine | Admitting: Emergency Medicine

## 2022-11-02 DIAGNOSIS — I129 Hypertensive chronic kidney disease with stage 1 through stage 4 chronic kidney disease, or unspecified chronic kidney disease: Secondary | ICD-10-CM | POA: Insufficient documentation

## 2022-11-02 DIAGNOSIS — Z8551 Personal history of malignant neoplasm of bladder: Secondary | ICD-10-CM | POA: Diagnosis not present

## 2022-11-02 DIAGNOSIS — N390 Urinary tract infection, site not specified: Secondary | ICD-10-CM | POA: Insufficient documentation

## 2022-11-02 DIAGNOSIS — Z7982 Long term (current) use of aspirin: Secondary | ICD-10-CM | POA: Insufficient documentation

## 2022-11-02 DIAGNOSIS — Z7902 Long term (current) use of antithrombotics/antiplatelets: Secondary | ICD-10-CM | POA: Diagnosis not present

## 2022-11-02 DIAGNOSIS — R531 Weakness: Secondary | ICD-10-CM | POA: Diagnosis not present

## 2022-11-02 DIAGNOSIS — N189 Chronic kidney disease, unspecified: Secondary | ICD-10-CM | POA: Diagnosis not present

## 2022-11-02 LAB — URINALYSIS, ROUTINE W REFLEX MICROSCOPIC
Bilirubin Urine: NEGATIVE
Glucose, UA: >=500 mg/dL — AB
Ketones, ur: NEGATIVE mg/dL
Nitrite: NEGATIVE
Protein, ur: 100 mg/dL — AB
Specific Gravity, Urine: 1.008 (ref 1.005–1.030)
Squamous Epithelial / HPF: NONE SEEN (ref 0–5)
WBC, UA: >50 WBC/hpf — ABNORMAL HIGH (ref 0–5)
pH: 5 (ref 5.0–8.0)

## 2022-11-02 LAB — BASIC METABOLIC PANEL
Anion gap: 11 (ref 5–15)
BUN: 25 mg/dL — ABNORMAL HIGH (ref 8–23)
CO2: 25 mmol/L (ref 22–32)
Calcium: 9.3 mg/dL (ref 8.9–10.3)
Chloride: 100 mmol/L (ref 98–111)
Creatinine, Ser: 2.1 mg/dL — ABNORMAL HIGH (ref 0.44–1.00)
GFR, Estimated: 23 mL/min — ABNORMAL LOW (ref >60)
Glucose, Bld: 161 mg/dL — ABNORMAL HIGH (ref 70–99)
Potassium: 3.8 mmol/L (ref 3.5–5.1)
Sodium: 136 mmol/L (ref 135–145)

## 2022-11-02 LAB — CBC
HCT: 41.6 % (ref 36.0–46.0)
Hemoglobin: 12.7 g/dL (ref 12.0–15.0)
MCH: 24.4 pg — ABNORMAL LOW (ref 26.0–34.0)
MCHC: 30.5 g/dL (ref 30.0–36.0)
MCV: 80 fL (ref 80.0–100.0)
Platelets: 320 10*3/uL (ref 150–400)
RBC: 5.2 MIL/uL — ABNORMAL HIGH (ref 3.87–5.11)
RDW: 18.6 % — ABNORMAL HIGH (ref 11.5–15.5)
WBC: 10.2 10*3/uL (ref 4.0–10.5)
nRBC: 0 % (ref 0.0–0.2)

## 2022-11-02 LAB — LACTIC ACID, PLASMA: Lactic Acid, Venous: 0.8 mmol/L (ref 0.5–1.9)

## 2022-11-02 LAB — TROPONIN I (HIGH SENSITIVITY): Troponin I (High Sensitivity): 15 ng/L (ref <18)

## 2022-11-02 MED ORDER — SODIUM CHLORIDE 0.9 % IV SOLN
1.0000 g | Freq: Once | INTRAVENOUS | Status: AC
  Administered 2022-11-02: 1 g via INTRAVENOUS
  Filled 2022-11-02: qty 10

## 2022-11-02 MED ORDER — CEPHALEXIN 500 MG PO CAPS: 500.0000 mg | ORAL_CAPSULE | Freq: Two times a day (BID) | ORAL | 0 refills | Status: AC

## 2022-11-02 MED ORDER — SODIUM CHLORIDE 0.9 % IV BOLUS
500.0000 mL | Freq: Once | INTRAVENOUS | Status: AC
  Administered 2022-11-02: 500 mL via INTRAVENOUS

## 2022-11-02 NOTE — ED Triage Notes (Signed)
Pt via POV from home. Pt c/o weakness since last night. States that this has happened before because a low hgb at the beginning of the year but denies any bleeding. Pt states that she was having pain yesterday. States that she does have a hx of kidney disease. States that her urine is more cloudy recently. Pt is A&Ox4 and NAD.

## 2022-11-02 NOTE — Discharge Instructions (Addendum)
Take the antibiotic as prescribed and finish the full course.  Return to the ER for new, worsening, or persistent weakness or lightheadedness, nausea or vomiting, inability to take the medication, fever, abdominal or flank pain, or any other new or worsening symptoms that concern you.

## 2022-11-02 NOTE — ED Provider Notes (Signed)
[facility] Provider Note    Event Date/Time   First MD Initiated Contact with Patient 11/02/22 2010     (approximate)   History   Weakness   HPI  Sara Aguirre is a 81 y.o. female with a history of bladder cancer status post cystectomy with ileal conduit, CVA status post right ICA stent on aspirin and Plavix, chronic kidney disease, and hypertension who presents with generalized weakness.  The son states that she has been somewhat more weak than normal since yesterday.  She has had a decreased appetite.  She also noted her urine to be cloudy.  The patient denies any acute complaints at this time states that she feels fine.  However the son is concerned that previously when she had a UTI and acute anemia she presented similarly.  I reviewed the past medical records.  The patient's most recent hospital admission was in June.  Per the hospitalist discharge summary from 6/14 the patient presented with syncope and was found to have acute anemia and UTI.   Physical Exam   Triage Vital Signs: ED Triage Vitals  Enc Vitals Group     BP 11/02/22 1835 (!) 162/79     Pulse Rate 11/02/22 1835 (!) 112     Resp 11/02/22 1835 16     Temp 11/02/22 1835 98.3 F (36.8 C)     Temp Source 11/02/22 1835 Oral     SpO2 11/02/22 1835 96 %     Weight 11/02/22 1833 144 lb (65.3 kg)     Height 11/02/22 1833 '5\' 8"'$  (1.727 m)     Head Circumference --      Peak Flow --      Pain Score 11/02/22 1833 0     Pain Loc --      Pain Edu? --      Excl. in [place]? --     Most recent vital signs: Vitals:   11/02/22 2230 11/02/22 2315  BP: (!) 158/57   Pulse: 70   Resp: 20   Temp:  98.1 F (36.7 C)  SpO2: 94%      General: Alert and oriented, well-appearing. CV:  Good peripheral perfusion.  Resp:  Normal effort.  Abd:  Soft and nontender.  No distention.  Other:  EOMI.  PERRLA.  Moist mucous membranes.  Motor intact in all extremities.   ED Results / Procedures / Treatments    Labs (all labs ordered are listed, but only abnormal results are displayed) Labs Reviewed  BASIC METABOLIC PANEL - Abnormal; Notable for the following components:      Result Value   Glucose, Bld 161 (*)    BUN 25 (*)    Creatinine, Ser 2.10 (*)    GFR, Estimated 23 (*)    All other components within normal limits  CBC - Abnormal; Notable for the following components:   RBC 5.20 (*)    MCH 24.4 (*)    RDW 18.6 (*)    All other components within normal limits  URINALYSIS, ROUTINE W REFLEX MICROSCOPIC - Abnormal; Notable for the following components:   Color, Urine YELLOW (*)    APPearance TURBID (*)    Glucose, UA >=500 (*)    Hgb urine dipstick SMALL (*)    Protein, ur 100 (*)    Leukocytes,Ua LARGE (*)    WBC, UA >50 (*)    Bacteria, UA MANY (*)    Crystals PRESENT (*)    All other components within  normal limits  LACTIC ACID, PLASMA  TROPONIN I (HIGH SENSITIVITY)     EKG  ED ECG REPORT I, Arta Silence, the attending physician, personally viewed and interpreted this ECG.  Date: 11/02/2022 EKG Time: 1840 Rate: 106 Rhythm: Sinus tachycardia QRS Axis: normal Intervals: RBBB ST/T Wave abnormalities: Nonspecific T wave abnormalities Narrative Interpretation: no evidence of acute ischemia    RADIOLOGY    PROCEDURES:  Critical Care performed: No  Procedures   MEDICATIONS ORDERED IN ED: Medications  cefTRIAXone (ROCEPHIN) 1 g in sodium chloride 0.9 % 100 mL IVPB (1 g Intravenous New Bag/Given 11/02/22 2314)  sodium chloride 0.9 % bolus 500 mL (500 mLs Intravenous New Bag/Given 11/02/22 2132)     IMPRESSION / MDM / [place] / ED COURSE  I reviewed the triage vital signs and the nursing notes.  81 year old female with PMH as noted above presents with generalized weakness since yesterday along with decreased appetite and cloudy urine.  The patient is overall well-appearing and there are no significant exam findings.  Differential  diagnosis includes, but is not limited to, UTI, other infection/sepsis, anemia, dehydration, AKI, less likely cardiac etiology.  We will obtain lab work-up, urinalysis, cardiac enzymes, give a fluid bolus and reassess.  Patient's presentation is most consistent with acute presentation with potential threat to life or bodily function.  The patient is on the cardiac monitor to evaluate for evidence of arrhythmia and/or significant heart rate changes.  ----------------------------------------- 11:18 PM on 11/02/2022 -----------------------------------------  Urinalysis is consistent with UTI.  I reviewed the patient's most recent urine cultures from June and she had E. coli which was pansensitive.  The rest of the blood work is very reassuring.  Troponin and lactate are both negative.  There is no indication for a repeat of either given the duration of the symptoms.  Creatinine is minimally elevated from baseline.  There is no leukocytosis.  Given the patient's weakness and her age I did consider whether she may benefit from admission.  I discussed the results of the work-up with the patient and her son.  Both of them strongly prefer to go home.  I think that given the patient's normal vital signs and lack of evidence of sepsis, this is appropriate.  I do not feel that the mild AKI is significant enough to require inpatient treatment.  The patient has already received fluids.  We will give a dose of ceftriaxone here and then treat with Keflex at home.  I gave the patient and her son strict return precautions and they expressed understanding.  They will follow-up with the PMD.   FINAL CLINICAL IMPRESSION(S) / ED DIAGNOSES   Final diagnoses:  Urinary tract infection without hematuria, site unspecified     Rx / DC Orders   ED Discharge Orders          Ordered    cephALEXin (KEFLEX) 500 MG capsule  2 times daily        11/02/22 2317             Note:  This document was prepared  using Dragon voice recognition software and may include unintentional dictation errors.    Arta Silence, MD 11/02/22 2320

## 2022-11-13 ENCOUNTER — Telehealth: Payer: Self-pay

## 2022-11-13 NOTE — Progress Notes (Addendum)
    Chronic Care Management Pharmacy Assistant   Name: CAMILLA SKEEN  MRN: [id number] DOB: 1941/10/07  Reason for Encounter: Non-CCM (Hosptial Follow Up)  Medications: Outpatient Encounter Medications as of 11/13/2022  Medication Sig   amLODipine (NORVASC) 10 MG tablet TAKE 1 TABLET BY MOUTH EVERY DAY FOR BLOOD PRESSURE   aspirin EC 81 MG tablet Take 81 mg by mouth daily. Swallow whole.   clopidogrel (PLAVIX) 75 MG tablet Take 1 tablet (75 mg total) by mouth daily. For stroke   dapagliflozin propanediol (FARXIGA) 10 MG TABS tablet Take 10 mg by mouth daily.   furosemide (LASIX) 20 MG tablet Take 20 mg by mouth every Monday, Wednesday, and Friday.   losartan (COZAAR) 100 MG tablet Take 100 mg by mouth daily.   rosuvastatin (CRESTOR) 5 MG tablet TAKE 1 TABLET (5 MG TOTAL) BY MOUTH DAILY. FOR CHOLESTEROL.   UNABLE TO FIND HOLISTER UROSTOMY POUCHES ITEM 3818   No facility-administered encounter medications on file as of 11/13/2022.   Reviewed hospital notes for details of recent visit. Has patient been contacted by Transitions of Care team? No Has patient seen PCP/specialist for hospital follow up (summarize OV if yes): No  Admitted to the ED on 11/02/2022. Discharge date was 11/02/2022.  Discharged from [facility].   Discharge diagnosis (Principal Problem): Urinary tract infection without hematuria  Patient was discharged to Home  Brief summary of hospital course:  Urinalysis is consistent with UTI.  I reviewed the patient's most recent urine cultures from June and she had E. coli which was pansensitive.   The rest of the blood work is very reassuring.  Troponin and lactate are both negative.  There is no indication for a repeat of either given the duration of the symptoms.  Creatinine is minimally elevated from baseline.  There is no leukocytosis.   Given the patient's weakness and her age I did consider whether she may benefit from admission.  I discussed the  results of the work-up with the patient and her son.  Both of them strongly prefer to go home.  I think that given the patient's normal vital signs and lack of evidence of sepsis, this is appropriate.  I do not feel that the mild AKI is significant enough to require inpatient treatment.  The patient has already received fluids.   We will give a dose of ceftriaxone here and then treat with Keflex at home.  I gave the patient and her son strict return precautions and they expressed understanding.  They will follow-up with the PMD.  New?Medications Started at [facility] Discharge:?? -Started cephALEXin (KEFLEX) 500 MG capsule    Medication Changes at Hospital Discharge: None noted  Medications Discontinued at Hospital Discharge: None noted  Medications that remain the same after Hospital Discharge:??  -All other medications will remain the same.    Next CCM appt: Non-CCM  Other upcoming appts: PCP appointment on 03/26/2023  Charlene Brooke, PharmD notified and will determine if action is needed.  Charlene Brooke, CPP notified  Marijean Niemann, [place] Clinical Pharmacy Assistant [phone]

## 2022-11-14 NOTE — Telephone Encounter (Signed)
Reviewed chart. Recent ED visit appears to be at least her 3rd UTI this year since starting [place] in Feb 2023 (prescribed by nephrology for kidney benefits). 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 I would recommend to hold or stop Wilder Glade for the time being given recurrent UTIs.  Routing to PCP for input.

## 2022-11-14 NOTE — Telephone Encounter (Signed)
Thanks, Mendel Ryder. Yes I completely agree with discontinuation of Farxiga.

## 2022-11-15 NOTE — Telephone Encounter (Signed)
Noted. Thanks for the follow-up.

## 2022-11-15 NOTE — Telephone Encounter (Signed)
Spoke with patient's son Elta Guadeloupe and pt on speaker.Durward Fortes to hold Wilder Glade until they can follow up with nephrologist. Elta Guadeloupe reports they have labwork tomorrow and appt on Tues 12/5 with nephrology and will certainly discuss ongoing Farxiga then. Pt had no other questions or concerns today.

## 2022-11-16 DIAGNOSIS — R8290 Unspecified abnormal findings in urine: Secondary | ICD-10-CM | POA: Diagnosis not present

## 2022-11-16 DIAGNOSIS — R319 Hematuria, unspecified: Secondary | ICD-10-CM | POA: Diagnosis not present

## 2022-11-16 DIAGNOSIS — N184 Chronic kidney disease, stage 4 (severe): Secondary | ICD-10-CM | POA: Diagnosis not present

## 2022-11-16 DIAGNOSIS — R600 Localized edema: Secondary | ICD-10-CM | POA: Diagnosis not present

## 2022-11-16 DIAGNOSIS — R809 Proteinuria, unspecified: Secondary | ICD-10-CM | POA: Diagnosis not present

## 2022-11-16 DIAGNOSIS — I129 Hypertensive chronic kidney disease with stage 1 through stage 4 chronic kidney disease, or unspecified chronic kidney disease: Secondary | ICD-10-CM | POA: Diagnosis not present

## 2022-11-16 DIAGNOSIS — N1832 Chronic kidney disease, stage 3b: Secondary | ICD-10-CM | POA: Diagnosis not present

## 2022-11-16 DIAGNOSIS — I10 Essential (primary) hypertension: Secondary | ICD-10-CM | POA: Diagnosis not present

## 2022-11-22 DIAGNOSIS — R809 Proteinuria, unspecified: Secondary | ICD-10-CM | POA: Diagnosis not present

## 2022-11-22 DIAGNOSIS — I10 Essential (primary) hypertension: Secondary | ICD-10-CM | POA: Diagnosis not present

## 2022-11-22 DIAGNOSIS — N1832 Chronic kidney disease, stage 3b: Secondary | ICD-10-CM | POA: Diagnosis not present

## 2022-11-29 ENCOUNTER — Encounter: Payer: Self-pay | Admitting: Primary Care

## 2022-11-29 ENCOUNTER — Ambulatory Visit (INDEPENDENT_AMBULATORY_CARE_PROVIDER_SITE_OTHER): Payer: PPO | Admitting: Primary Care

## 2022-11-29 VITALS — BP 162/80 | HR 75 | Temp 98.2°F | Ht 68.0 in | Wt 144.0 lb

## 2022-11-29 DIAGNOSIS — R454 Irritability and anger: Secondary | ICD-10-CM

## 2022-11-29 DIAGNOSIS — N1832 Chronic kidney disease, stage 3b: Secondary | ICD-10-CM

## 2022-11-29 NOTE — Progress Notes (Signed)
Subjective:    Patient ID: Sara Aguirre, female    DOB: 05/31/1941, 81 y.o.   MRN: [id number]  HPI  Sara Aguirre is a very pleasant 81 y.o. female with a history of hypertension, CKD stage IIIb, bladder cancer, hyperlipidemia, CVA, hematuria, proteinuria who presents today for ED follow-up and to discuss mood.  Her son joins [place] today.  She presented to [facility] ED on 11/02/2022 for generalized weakness, decrease in appetite, cloudy urine.  Workup in the ED was consistent for UTI, other lab work was "reassuring".  Given her age and chief complaint it was recommended she be admitted for further treatment; however, she and her son wanted to send her home.  She was discharged home later that evening with a prescription for Keflex and recommendations for PCP follow-up.  Since her ED visit she completed her Keflex course and her weakness has improved. She denies foul smelling urine, cloudy urine, hematuria, weakness. She's feeling very well overall.   Her son contacted [place] recently via [place] reporting changes in mood, especially when having to abide by strict renal diet and since his daughter moved into their home.  He requested further evaluation. Today she is adherent to a strict renal diet. Her son prepares all meals and does not add sodium. Her partial nephrectomy surgery was cancelled as she decided not to go through. She is following with nephrology who plans on monitoring her cysts/masses with routine imaging moving forward. She is now compliant to both amlodipine 10 mg and losartan 100 mg daily. She just resumed her losartan one week ago.   Today she admits to increased frustration since her granddaughter and her friend have moved into the home. She doesn't feel that she is respected by her granddaughter or granddaughter's friend. There has been some tension between she and her granddaughter, she has verbally insulted her granddaughter who is not speaking to her now. Her son is looking into  counseling for his family.     BP Readings from Last 3 Encounters:  11/29/22 (!) 162/80  11/03/22 (!) 177/71  07/11/22 (!) 142/74      Review of Systems  Respiratory:  Negative for shortness of breath.   Cardiovascular:  Negative for chest pain.  Genitourinary:  Negative for dysuria, frequency and hematuria.  Neurological:  Negative for weakness.  Psychiatric/Behavioral:         Increased irritability and frustration          Past Medical History:  Diagnosis Date   Cancer ([place])    bladder, s/p cystectomy with ileal conduit   Hematuria 09/27/2021   History of bladder cancer    History of tobacco use    Hyperlipidemia    Hypertension    Incarcerated ventral hernia, s/p lap repair 12/05/2011   Parastomal hernia of ileal conduit, s/p lap repair [id number] 12/04/2011   Ulcer, gastric, acute or chronic     Social History   Socioeconomic History   Marital status: Widowed    Spouse name: Not on file   Number of children: 2   Years of education: Not on file   Highest education level: Some college, no degree  Occupational History   Occupation: retired  Tobacco Use   Smoking status: Former    Types: Cigarettes    Quit date: 01/02/1993    Years since quitting: 29.9   Smokeless tobacco: Never  Vaping Use   Vaping Use: Never used  Substance and Sexual Activity   Alcohol use: No  Drug use: No   Sexual activity: Not on file  Other Topics Concern   Not on file  Social History Narrative   Marital status: widowed since 59; not dating; not interested      Children:  Twin boys (69); 2 grandchildren      Lives: alone with dog, 5 chickens, 2 rabbits      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  Gardening      ADLs: drives; independent with ADLs; has garden      Advanced Directives: YES: full code but no prolonged measures; HCPOA:  Orvis Brill or other son Cory Munch; copy on chart 04/2017.   Social Determinants of Health   Financial Resource Strain: Low Risk  (03/22/2022)   Overall Financial Resource Strain (CARDIA)    Difficulty of Paying Living Expenses: Not hard at all  Food Insecurity: No Food Insecurity (03/22/2022)   Hunger Vital Sign    Worried About Running Out of Food in the Last Year: Never true    Ran Out of Food in the Last Year: Never true  Transportation Needs: No Transportation Needs (03/22/2022)   PRAPARE - Hydrologist (Medical): No    Lack of Transportation (Non-Medical): No  Physical Activity: Insufficiently Active (03/22/2022)   Exercise Vital Sign    Days of Exercise per Week: 3 days    Minutes of Exercise per Session: 30 min  Stress: No Stress Concern Present (03/22/2022)   [place]    Feeling of Stress : Not at all  Social Connections: Socially Isolated (03/22/2022)   Social Connection and Isolation Panel [NHANES]    Frequency of Communication with Friends and Family: More than three times a week    Frequency of Social Gatherings with Friends and Family: More than three times a week    Attends Religious Services: Never    Marine scientist or Organizations: No    Attends Archivist Meetings: Never    Marital Status: Widowed  Intimate Partner Violence: Not At Risk (03/22/2022)   Humiliation, Afraid, Rape, and Kick questionnaire    Fear of Current or Ex-Partner: No    Emotionally Abused: No    Physically Abused: No    Sexually Abused: No    Past Surgical History:  Procedure Laterality Date   BLADDER REMOVAL  10/1993   Dr. Risa Grill   CAROTID PTA/STENT INTERVENTION Right 11/08/2021   Procedure: CAROTID PTA/STENT INTERVENTION;  Surgeon: Katha Cabal, MD;  Location: [facility];  Service: Cardiovascular;  Laterality: Right;   HERNIA REPAIR  12/05/11   parastomal    LOOP RECORDER INSERTION N/A 04/23/2020   Procedure: LOOP RECORDER INSERTION;  Surgeon: Deboraha Sprang, MD;  Location: [facility];  Service: Cardiovascular;  Laterality: N/A;   PARASTOMAL HERNIA REPAIR  12/05/2011   Procedure: HERNIA REPAIR PARASTOMAL;  Surgeon: Adin Hector, MD;  Location: [facility];  Service: General;;  Laprascopic lysis of adhestons with reduction and repair with biological mesh for incarcerated parastomal and ventral long incisional hernia.   REVISION UROSTOMY CUTANEOUS     VENTRAL HERNIA REPAIR  12/05/2011   Procedure: HERNIA REPAIR VENTRAL ADULT;  Surgeon: Adin Hector, MD;  Location: [facility];  Service:  General;;    Family History  Problem Relation Age of Onset   Heart disease Mother 75       CHF   Alcohol abuse Father    Stroke Father     Allergies  Allergen Reactions   Imodium [Loperamide]     Itchy all over   Loperamide Hcl Rash   Other     Also allergic to a "cough medicine" but patient does not remember the name    Current Outpatient Medications on File Prior to Visit  Medication Sig Dispense Refill   amLODipine (NORVASC) 10 MG tablet TAKE 1 TABLET BY MOUTH EVERY DAY FOR BLOOD PRESSURE 90 tablet 0   aspirin EC 81 MG tablet Take 81 mg by mouth daily. Swallow whole.     clopidogrel (PLAVIX) 75 MG tablet Take 1 tablet (75 mg total) by mouth daily. For stroke 90 tablet 3   furosemide (LASIX) 20 MG tablet Take 20 mg by mouth every Monday, Wednesday, and Friday.     losartan (COZAAR) 100 MG tablet Take 100 mg by mouth daily.     rosuvastatin (CRESTOR) 5 MG tablet TAKE 1 TABLET (5 MG TOTAL) BY MOUTH DAILY. FOR CHOLESTEROL. 90 tablet 1   UNABLE TO FIND HOLISTER UROSTOMY POUCHES ITEM 8478 100 Device 3   dapagliflozin propanediol (FARXIGA) 10 MG TABS tablet Take 10 mg by mouth daily. (Patient not taking: Reported on 11/29/2022)     No current facility-administered medications on file prior to visit.    BP (!) 162/80   Pulse 75   Temp 98.2 F  (36.8 C) (Temporal)   Ht '5\' 8"'$  (1.727 m)   Wt 144 lb (65.3 kg)   SpO2 99%   BMI 21.90 kg/m  Objective:   Physical Exam Cardiovascular:     Rate and Rhythm: Normal rate and regular rhythm.  Pulmonary:     Effort: Pulmonary effort is normal.     Breath sounds: Normal breath sounds.  Musculoskeletal:     Cervical back: Neck supple.  Skin:    General: Skin is warm and dry.           Assessment & Plan:   Problem List Items Addressed This Visit       Genitourinary   CKD (chronic kidney disease), stage IIIb - Primary    Commended she and her son on meal prepping and adherence to a renal diet. Continue to follow with nephrology. Reviewed office notes from December 2023.  Continue amlodipine 10 mg and losartan 100 mg daily.  Recommended she monitor BP at home, report readings in 1-2 weeks.  Consider switching from losartan to valsartan.         Other   Irritability and anger    Long discussion with she and her son regarding her recent feelings and actions. I agree with family counseling as there seems to be quite a bit of tension between family members.  She kindly declines medication treatment for irritability.  Will have her follow up in 1 month. Continue to monitor.             Pleas Koch, NP  35 min spent with patient and her son to discuss symptoms, review ED and nephrology notes, and develop our plan. See assessment and plan.

## 2022-11-29 NOTE — Patient Instructions (Signed)
Continue taking losartan and amlodipine for blood pressure.  Please schedule a physical to meet with me in 1 months.   It was a pleasure to see you today!

## 2022-11-29 NOTE — Assessment & Plan Note (Addendum)
Commended she and her son on meal prepping and adherence to a renal diet. Continue to follow with nephrology. Reviewed office notes from December 2023.  Continue amlodipine 10 mg and losartan 100 mg daily.  Recommended she monitor BP at home, report readings in 1-2 weeks.  Consider switching from losartan to valsartan.

## 2022-11-29 NOTE — Assessment & Plan Note (Signed)
Long discussion with she and her son regarding her recent feelings and actions. I agree with family counseling as there seems to be quite a bit of tension between family members.  She kindly declines medication treatment for irritability.  Will have her follow up in 1 month. Continue to monitor.

## 2022-12-04 ENCOUNTER — Other Ambulatory Visit: Payer: Self-pay | Admitting: Primary Care

## 2022-12-04 DIAGNOSIS — I10 Essential (primary) hypertension: Secondary | ICD-10-CM

## 2022-12-04 MED ORDER — AMLODIPINE BESYLATE 10 MG PO TABS: 10.0000 mg | ORAL_TABLET | Freq: Every day | ORAL | 1 refills | Status: DC

## 2022-12-04 NOTE — Telephone Encounter (Signed)
From: [name] To: Office of Pleas Koch, NP Sent: 12/04/2022 11:01 AM EST Subject: Medication Renewal Request  Refills have been requested for the following medications:   amLODipine (NORVASC) 10 MG tablet [Suman Trivedi K Harnoor Reta]  [id number]: PArlyss Gandy

## 2022-12-29 ENCOUNTER — Telehealth: Payer: PPO | Admitting: Family Medicine

## 2022-12-29 DIAGNOSIS — J40 Bronchitis, not specified as acute or chronic: Secondary | ICD-10-CM | POA: Diagnosis not present

## 2022-12-29 MED ORDER — PREDNISONE 20 MG PO TABS: 20.0000 mg | ORAL_TABLET | Freq: Every day | ORAL | 0 refills | Status: AC

## 2022-12-29 MED ORDER — BENZONATATE 200 MG PO CAPS: 200.0000 mg | ORAL_CAPSULE | Freq: Two times a day (BID) | ORAL | 0 refills | Status: DC | PRN

## 2022-12-29 MED ORDER — AZITHROMYCIN 250 MG PO TABS: ORAL_TABLET | ORAL | 0 refills | Status: AC

## 2022-12-29 NOTE — Progress Notes (Signed)
We are sorry that you are not feeling well.  Here is how we plan to help!  Based on your presentation I believe you most likely have A cough due to bacteria.  When patients have a fever and a productive cough with a change in color or increased sputum production, we are concerned about bacterial bronchitis.  If left untreated it can progress to pneumonia.  If your symptoms do not improve with your treatment plan it is important that you contact your provider.   I have prescribed Azithromyin 250 mg: two tablets now and then one tablet daily for 4 additonal days    In addition you may use A prescription cough medication called Tessalon Perles '100mg'$ . You may take 1-2 capsules every 8 hours as needed for your cough.  Prednisone 20 mg daily for 5 days  From your responses in the eVisit questionnaire you describe inflammation in the upper respiratory tract which is causing a significant cough.  This is commonly called Bronchitis and has four common causes:   Allergies Viral Infections Acid Reflux Bacterial Infection Allergies, viruses and acid reflux are treated by controlling symptoms or eliminating the cause. An example might be a cough caused by taking certain blood pressure medications. You stop the cough by changing the medication. Another example might be a cough caused by acid reflux. Controlling the reflux helps control the cough.  USE OF BRONCHODILATOR ("RESCUE") INHALERS: There is a risk from using your bronchodilator too frequently.  The risk is that over-reliance on a medication which only relaxes the muscles surrounding the breathing tubes can reduce the effectiveness of medications prescribed to reduce swelling and congestion of the tubes themselves.  Although you feel brief relief from the bronchodilator inhaler, your asthma may actually be worsening with the tubes becoming more swollen and filled with mucus.  This can delay other crucial treatments, such as oral steroid medications. If you  need to use a bronchodilator inhaler daily, several times per day, you should discuss this with your provider.  There are probably better treatments that could be used to keep your asthma under control.     HOME CARE Only take medications as instructed by your medical team. Complete the entire course of an antibiotic. Drink plenty of fluids and get plenty of rest. Avoid close contacts especially the very young and the elderly Cover your mouth if you cough or cough into your sleeve. Always remember to wash your hands A steam or ultrasonic humidifier can help congestion.   GET HELP RIGHT AWAY IF: You develop worsening fever. You become short of breath You cough up blood. Your symptoms persist after you have completed your treatment plan MAKE SURE YOU  Understand these instructions. Will watch your condition. Will get help right away if you are not doing well or get worse.    Thank you for choosing an e-visit.  Your e-visit answers were reviewed by a board certified advanced clinical practitioner to complete your personal care plan. Depending upon the condition, your plan could have included both over the counter or prescription medications.  Please review your pharmacy choice. Make sure the pharmacy is open so you can pick up prescription now. If there is a problem, you may contact your provider through [employer] and have the prescription routed to another pharmacy.  Your safety is important to [place]. If you have drug allergies check your prescription carefully.   For the next 24 hours you can use MyChart to ask questions about today's visit,  request a non-urgent call back, or ask for a work or school excuse. You will get an email in the next two days asking about your experience. I hope that your e-visit has been valuable and will speed your recovery.    have provided 5 minutes of non face to face time during this encounter for chart review and documentation.

## 2023-01-03 DIAGNOSIS — Z936 Other artificial openings of urinary tract status: Secondary | ICD-10-CM | POA: Diagnosis not present

## 2023-01-23 DIAGNOSIS — I129 Hypertensive chronic kidney disease with stage 1 through stage 4 chronic kidney disease, or unspecified chronic kidney disease: Secondary | ICD-10-CM | POA: Diagnosis not present

## 2023-01-23 DIAGNOSIS — N1330 Unspecified hydronephrosis: Secondary | ICD-10-CM | POA: Diagnosis not present

## 2023-01-23 DIAGNOSIS — N390 Urinary tract infection, site not specified: Secondary | ICD-10-CM | POA: Diagnosis not present

## 2023-01-23 DIAGNOSIS — Z8551 Personal history of malignant neoplasm of bladder: Secondary | ICD-10-CM | POA: Diagnosis not present

## 2023-01-23 DIAGNOSIS — N183 Chronic kidney disease, stage 3 unspecified: Secondary | ICD-10-CM | POA: Diagnosis not present

## 2023-01-23 DIAGNOSIS — N2889 Other specified disorders of kidney and ureter: Secondary | ICD-10-CM | POA: Diagnosis not present

## 2023-02-16 ENCOUNTER — Other Ambulatory Visit: Payer: Self-pay | Admitting: Primary Care

## 2023-02-16 DIAGNOSIS — E782 Mixed hyperlipidemia: Secondary | ICD-10-CM

## 2023-03-13 ENCOUNTER — Ambulatory Visit (INDEPENDENT_AMBULATORY_CARE_PROVIDER_SITE_OTHER): Payer: PPO | Admitting: Primary Care

## 2023-03-13 ENCOUNTER — Ambulatory Visit (INDEPENDENT_AMBULATORY_CARE_PROVIDER_SITE_OTHER)
Admission: RE | Admit: 2023-03-13 | Discharge: 2023-03-13 | Disposition: A | Discharge status: Home / Self Care | Payer: PPO | Source: Ambulatory Visit | Attending: Primary Care | Admitting: Primary Care

## 2023-03-13 ENCOUNTER — Encounter: Payer: Self-pay | Admitting: Primary Care

## 2023-03-13 VITALS — BP 168/86 | HR 110 | Temp 98.3°F | Ht 68.0 in | Wt 145.0 lb

## 2023-03-13 DIAGNOSIS — R410 Disorientation, unspecified: Secondary | ICD-10-CM | POA: Diagnosis not present

## 2023-03-13 DIAGNOSIS — R5383 Other fatigue: Secondary | ICD-10-CM | POA: Diagnosis not present

## 2023-03-13 DIAGNOSIS — I10 Essential (primary) hypertension: Secondary | ICD-10-CM | POA: Diagnosis not present

## 2023-03-13 DIAGNOSIS — R000 Tachycardia, unspecified: Secondary | ICD-10-CM

## 2023-03-13 DIAGNOSIS — R053 Chronic cough: Secondary | ICD-10-CM | POA: Diagnosis not present

## 2023-03-13 DIAGNOSIS — R059 Cough, unspecified: Secondary | ICD-10-CM | POA: Diagnosis not present

## 2023-03-13 HISTORY — DX: Disorientation, unspecified: R41.0

## 2023-03-13 LAB — POC URINALSYSI DIPSTICK (AUTOMATED)
Bilirubin, UA: NEGATIVE
Blood, UA: POSITIVE
Glucose, UA: NEGATIVE
Ketones, UA: NEGATIVE
Nitrite, UA: POSITIVE
Protein, UA: POSITIVE — AB
Spec Grav, UA: 1.01 (ref 1.010–1.025)
Urobilinogen, UA: 0.2 E.U./dL
pH, UA: 6 (ref 5.0–8.0)

## 2023-03-13 MED ORDER — SULFAMETHOXAZOLE-TRIMETHOPRIM 800-160 MG PO TABS: 1.0000 | ORAL_TABLET | Freq: Two times a day (BID) | ORAL | 0 refills | Status: AC

## 2023-03-13 MED ORDER — BENZONATATE 200 MG PO CAPS: 200.0000 mg | ORAL_CAPSULE | Freq: Three times a day (TID) | ORAL | 0 refills | Status: DC | PRN

## 2023-03-13 MED ORDER — VALSARTAN 160 MG PO TABS: 160.0000 mg | ORAL_TABLET | Freq: Every day | ORAL | 0 refills | Status: DC

## 2023-03-13 NOTE — Assessment & Plan Note (Addendum)
EKG shows atrial tachycardia with no ST elevation, PACs or PVCs.   EKG similar to the last EKG in November.   I evaluated patient, was consulted regarding treatment, and agree with assessment and plan per Tinnie Gens, RN, DNP student.   Allie Bossier, NP-C

## 2023-03-13 NOTE — Assessment & Plan Note (Addendum)
Differentials include acute cystitis, hypothyroidism, anemia  POC urinalysis shows 3+ leuks, 2+ protein, 2+ blood and + nitrates.  Urine culture pending.   Given presentation and symptoms, will treat.  Prescription sent for Bactrim DS BID for five days.   Will check labs to rule out other metabolic causes.   I evaluated patient, was consulted regarding treatment, and agree with assessment and plan per Tinnie Gens, RN, DNP student.   Allie Bossier, NP-C

## 2023-03-13 NOTE — Assessment & Plan Note (Addendum)
Uncontrolled.   Will change Losartan 100 mg once daily to Valsartan 160 mg once daily.  Continue Amlodipine 10 mg once daily.   Discussed with patients son to continue monitoring blood pressure at home.  Follow up in 2 weeks.   I evaluated patient, was consulted regarding treatment, and agree with assessment and plan per Tinnie Gens, RN, DNP student.   Allie Bossier, NP-C

## 2023-03-13 NOTE — Progress Notes (Signed)
Established Patient Office Visit  Subjective   Patient ID: Sara Aguirre, female    DOB: [birth year]  Age: 82 y.o. MRN: [id number]  Chief Complaint  Patient presents with   Fatigue    Patients son states that patient doesn't seem completely sound. He mentioned she left the house today to go to the doc with only a bra on and thought she was fully clothed, she left the car door wide open when coming in, and tried sitting on a bunch of things in a chair.   Patients states she is dealing with ongoing cough and congestion since December and states she feel she spends all her energy on coughing and then just feels so weak and no energy    HPI  Sara Aguirre is a 82 year old female with past medical history of hypertension, thyroid nodule, hx of CVA, CKD stage 3b; hx of bladder cancer, presents today to discuss fatigue.   Alerted mental status: Son reports that the confusion started today when she came out of the house only wearing her bra and thought that she was fully dressed. When she got out of the car, she left the car door open and continued to walk. She also sat on her belongings in the lobby. The son reports this is new and has not happened before.   Fatigue: She had a URI in December and was evaluated in January. She was prescribed Azithromycin and completed the course. She reports that her symptoms never went away and are about the same. She was not checked for covid. She has been taking her Vitamin C and dayquil with some relief. She reports that she has intermittent cough and the congestion has resolved. She does not cough anything up.   She denies any chest pain, shortness of breath, difficulty breathing. She denies any blood in urine or stool. She denies any increase/decrease in urine output, dysuria or abdominal pain. She denies any insomnia.    Patient Active Problem List   Diagnosis Date Noted   Tachycardia 03/13/2023   Other fatigue 03/13/2023   Persistent cough for 3 weeks  or longer 03/13/2023   Confusion 03/13/2023   Irritability and anger 11/29/2022   Preoperative clearance 07/11/2022   Left renal mass 06/01/2022   Cystitis 04/19/2022   Carotid stenosis, symptomatic, with infarction ([place]) 11/08/2021   Bilateral carotid artery stenosis 10/20/2021   History of TIA (transient ischemic attack) 10/08/2021   CKD (chronic kidney disease), stage IIIb 10/08/2021   COVID-19 virus infection 10/08/2021   Benign hypertensive kidney disease with chronic kidney disease 09/27/2021   Proteinuria 09/27/2021   History of CVA s/p R ICA stent(cerebrovascular accident) 04/21/2020   Hypokalemia 04/21/2020   Pure hypercholesterolemia 01/17/2017   Thyroid nodule 08/20/2013   Hypertension 08/20/2013   Body mass index (BMI) of 24.0-24.9 in adult 08/20/2013   Gastric ulcer 12/29/2011   Renal insufficiency 12/29/2011   History of bladder cancer, s/p cystectomy with ileal conduit    Past Medical History:  Diagnosis Date   Cancer ([place])    bladder, s/p cystectomy with ileal conduit   Hematuria 09/27/2021   History of bladder cancer    History of tobacco use    Hyperlipidemia    Hypertension    Incarcerated ventral hernia, s/p lap repair 12/05/2011   Parastomal hernia of ileal conduit, s/p lap repair [id number] 12/04/2011   Ulcer, gastric, acute or chronic    Past Surgical History:  Procedure Laterality Date   BLADDER REMOVAL  10/1993   Dr. Risa Grill   CAROTID PTA/STENT INTERVENTION Right 11/08/2021   Procedure: CAROTID PTA/STENT INTERVENTION;  Surgeon: Katha Cabal, MD;  Location: [facility];  Service: Cardiovascular;  Laterality: Right;   HERNIA REPAIR  12/05/11   parastomal   LOOP RECORDER INSERTION N/A 04/23/2020   Procedure: LOOP RECORDER INSERTION;  Surgeon: Deboraha Sprang, MD;  Location: [facility];  Service: Cardiovascular;  Laterality: N/A;   PARASTOMAL HERNIA REPAIR  12/05/2011   Procedure: HERNIA REPAIR PARASTOMAL;  Surgeon: Adin Hector,  MD;  Location: [facility];  Service: General;;  Laprascopic lysis of adhestons with reduction and repair with biological mesh for incarcerated parastomal and ventral long incisional hernia.   REVISION UROSTOMY CUTANEOUS     VENTRAL HERNIA REPAIR  12/05/2011   Procedure: HERNIA REPAIR VENTRAL ADULT;  Surgeon: Adin Hector, MD;  Location: [facility];  Service: General;;   Social History   Tobacco Use   Smoking status: Former    Types: Cigarettes    Quit date: 01/02/1993    Years since quitting: 30.2   Smokeless tobacco: Never  Vaping Use   Vaping Use: Never used  Substance Use Topics   Alcohol use: No   Drug use: No   Family History  Problem Relation Age of Onset   Heart disease Mother 7       CHF   Alcohol abuse Father    Stroke Father    Allergies  Allergen Reactions   Imodium [Loperamide]     Itchy all over   Loperamide Hcl Rash   Other     Also allergic to a "cough medicine" but patient does not remember the name      Review of Systems  Constitutional:  Positive for malaise/fatigue. Negative for chills and fever.  Eyes:  Negative for blurred vision.  Respiratory:  Negative for shortness of breath.   Cardiovascular:  Negative for chest pain.  Genitourinary:  Negative for dysuria, frequency and urgency.  Neurological:  Negative for dizziness and headaches.      Objective:     BP (!) 168/86   Pulse (!) 110   Temp 98.3 F (36.8 C) (Temporal)   Ht 5\' 8"  (1.727 m)   Wt 145 lb (65.8 kg)   SpO2 97%   BMI 22.05 kg/m  BP Readings from Last 3 Encounters:  03/13/23 (!) 168/86  11/29/22 (!) 162/80  11/03/22 (!) 177/71   Wt Readings from Last 3 Encounters:  03/13/23 145 lb (65.8 kg)  11/29/22 144 lb (65.3 kg)  11/02/22 144 lb (65.3 kg)      Physical Exam Vitals and nursing note reviewed.  Constitutional:      Appearance: Normal appearance.  Cardiovascular:     Rate and Rhythm: Regular rhythm. Tachycardia present.     Pulses: Normal pulses.     Heart sounds:  Normal heart sounds.  Pulmonary:     Effort: Pulmonary effort is normal.     Breath sounds: Normal breath sounds.  Skin:    General: Skin is warm.  Neurological:     Mental Status: She is alert and oriented to person, place, and time.     Cranial Nerves: Cranial nerves 2-12 are intact.  Psychiatric:        Mood and Affect: Mood normal.        Behavior: Behavior normal.      Results for orders placed or performed in visit on 03/13/23  POCT Urinalysis Dipstick (Automated)  Result Value Ref Range   Color, UA yellow    Clarity, UA Cloudy    Glucose, UA Negative Negative   Bilirubin, UA neg    Ketones, UA neg    Spec Grav, UA 1.010 1.010 - 1.025   Blood, UA pos    pH, UA 6.0 5.0 - 8.0   Protein, UA Positive (A) Negative   Urobilinogen, UA 0.2 0.2 or 1.0 E.U./dL   Nitrite, UA pos    Leukocytes, UA Large (3+) (A) Negative       The ASCVD Risk score (Arnett DK, et al., 2019) failed to calculate for the following reasons:   The 2019 ASCVD risk score is only valid for ages 60 to 29   The patient has a prior MI or stroke diagnosis    Assessment & Plan:   Problem List Items Addressed This Visit       Cardiovascular and Mediastinum   Hypertension    Uncontrolled.   Will change Losartan 100 mg once daily to Valsartan 160 mg once daily.  Continue Amlodipine 10 mg once daily.   Discussed with patients son to continue monitoring blood pressure at home.  Follow up in 2 weeks.   I evaluated patient, was consulted regarding treatment, and agree with assessment and plan per Tinnie Gens, RN, DNP student.   Allie Bossier, NP-C       Relevant Medications   valsartan (DIOVAN) 160 MG tablet     Nervous and Auditory   Confusion    Differentials include acute cystitis, TIA. No red flags on exam.   POC UA shows 3+ leuks, + nitrates, 2+ protein, 2+ blood Urine culture pending.  Give presentation and symptoms, will treat.   Start Bactrim DS one tablet BID for five days.    Consider CT head if symptoms do not improve after treatment.   I evaluated patient, was consulted regarding treatment, and agree with assessment and plan per Tinnie Gens, RN, DNP student.   Allie Bossier, NP-C       Relevant Medications   sulfamethoxazole-trimethoprim (BACTRIM DS) 800-160 MG tablet   Other Relevant Orders   POCT Urinalysis Dipstick (Automated) (Completed)   Basic metabolic panel   Urine Culture     Other   Tachycardia - Primary    EKG shows atrial tachycardia with no ST elevation, PACs or PVCs.   EKG similar to the last EKG in November.   I evaluated patient, was consulted regarding treatment, and agree with assessment and plan per Tinnie Gens, RN, DNP student.   Allie Bossier, NP-C       Relevant Orders   EKG 12-Lead (Completed)   POCT Urinalysis Dipstick (Automated) (Completed)   Basic metabolic panel   TSH   Urine Culture   Other fatigue    Differentials include acute cystitis, hypothyroidism, anemia  POC urinalysis shows 3+ leuks, 2+ protein, 2+ blood and + nitrates.  Urine culture pending.   Given presentation and symptoms, will treat.  Prescription sent for Bactrim DS BID for five days.   Will check labs to rule out other metabolic causes.   I evaluated patient, was consulted regarding treatment, and agree with assessment and plan per Tinnie Gens, RN, DNP student.   Allie Bossier, NP-C         Relevant Medications   sulfamethoxazole-trimethoprim (BACTRIM DS) 800-160 MG tablet   Other Relevant Orders   EKG 12-Lead (Completed)   CBC with Differential   DG Chest 2 View   POCT Urinalysis  Dipstick (Automated) (Completed)   Basic metabolic panel   TSH   Urine Culture   Persistent cough for 3 weeks or longer    Differentials include pneumonia, silent acid reflux, post-viral cough  Chest x-ray pending to rule out pneumonia.   Refill sent for [employer]   Recommend OTC famotidine 20 mg once daily as needed.   I evaluated  patient, was consulted regarding treatment, and agree with assessment and plan per Tinnie Gens, RN, DNP student.   Allie Bossier, NP-C       Relevant Medications   benzonatate (TESSALON) 200 MG capsule   Other Relevant Orders   DG Chest 2 View    Return in about 2 weeks (around 03/27/2023) for blood pressure.    Tinnie Gens, BSN-RN, DNP STUDENT

## 2023-03-13 NOTE — Patient Instructions (Signed)
Stop by the lab prior to leaving today. I will notify you of your results once received.   Complete xray(s) prior to leaving today. I will notify you of your results once received.  It was a pleasure to see you today!

## 2023-03-13 NOTE — Progress Notes (Signed)
Subjective:    Patient ID: Sara Aguirre, female    DOB: 12-27-1940, 82 y.o.   MRN: [id number]  HPI  Sara Aguirre is a very pleasant 82 y.o. female with a history of CVA, hypertension, CKD, bladder cancer, irritability and anger carotid artery stenosis who presents today to discuss fatigue and chronic cough with congestion.   Her son joins [place] today who is giving information for HPI. The patient lives with her son.   Symptom onset today with confusion for which her son noticed when he came home from work. The patient came out of the house wearing only a bra thinking she was fully dressed. Her son had to help her dress herself. When getting out of the car for her appointment today she didn't close the car door and she had an altered gait. While waiting in the lobby she attempted to sit down on her son's belongings rather than the chair. These symptoms are all new. She denies changes in urine, dysuria, hematuria. Last seen normal was yesterday early evening.   She's had a cough and congestion since December 2023. Evaluated through an [facility] in January 2024, was treated with Azithromycin 250 mg tablets for a five day course and Tessalon Perles. She completed this course. Symptoms never improved and are about the same. She's been taking Vitamin D and Dayquil with little relief. Her congestion has resolved but her cough remains. She denies fevers, chills, headaches, chest pain, insomnia.  She is checking her BP at home which is running [id number] systolic.   BP Readings from Last 3 Encounters:  03/13/23 (!) 168/86  11/29/22 (!) 162/80  11/03/22 (!) 177/71     Review of Systems  Constitutional:  Positive for fatigue. Negative for chills and fever.  HENT:  Negative for congestion and sore throat.   Respiratory:  Positive for cough. Negative for shortness of breath.   Cardiovascular:  Negative for chest pain.  Genitourinary:  Negative for dysuria, frequency and hematuria.   Psychiatric/Behavioral:  Positive for confusion.          Past Medical History:  Diagnosis Date   Cancer ([place])    bladder, s/p cystectomy with ileal conduit   Hematuria 09/27/2021   History of bladder cancer    History of tobacco use    Hyperlipidemia    Hypertension    Incarcerated ventral hernia, s/p lap repair 12/05/2011   Parastomal hernia of ileal conduit, s/p lap repair [id number] 12/04/2011   Ulcer, gastric, acute or chronic     Social History   Socioeconomic History   Marital status: Widowed    Spouse name: Not on file   Number of children: 2   Years of education: Not on file   Highest education level: Some college, no degree  Occupational History   Occupation: retired  Tobacco Use   Smoking status: Former    Types: Cigarettes    Quit date: 01/02/1993    Years since quitting: 30.2   Smokeless tobacco: Never  Vaping Use   Vaping Use: Never used  Substance and Sexual Activity   Alcohol use: No   Drug use: No   Sexual activity: Not on file  Other Topics Concern   Not on file  Social History Narrative   Marital status: widowed since 1992; not dating; not interested      Children:  Twin boys (33); 2 grandchildren      Lives: alone with dog, 5 chickens, 2 rabbits      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  Gardening      ADLs: drives; independent with ADLs; has garden      Advanced Directives: YES: full code but no prolonged measures; HCPOA: Orvis Brill or other son Cory Munch; copy on chart 04/2017.   Social Determinants of Health   Financial Resource Strain: Low Risk  (03/22/2022)   Overall Financial Resource Strain (CARDIA)    Difficulty of Paying Living Expenses: Not hard at all  Food Insecurity: No Food Insecurity (03/22/2022)   Hunger Vital Sign    Worried About Running Out of Food in the Last Year:  Never true    Ran Out of Food in the Last Year: Never true  Transportation Needs: No Transportation Needs (03/22/2022)   PRAPARE - Hydrologist (Medical): No    Lack of Transportation (Non-Medical): No  Physical Activity: Insufficiently Active (03/22/2022)   Exercise Vital Sign    Days of Exercise per Week: 3 days    Minutes of Exercise per Session: 30 min  Stress: No Stress Concern Present (03/22/2022)   [place]    Feeling of Stress : Not at all  Social Connections: Socially Isolated (03/22/2022)   Social Connection and Isolation Panel [NHANES]    Frequency of Communication with Friends and Family: More than three times a week    Frequency of Social Gatherings with Friends and Family: More than three times a week    Attends Religious Services: Never    Marine scientist or Organizations: No    Attends Archivist Meetings: Never    Marital Status: Widowed  Intimate Partner Violence: Not At Risk (03/22/2022)   Humiliation, Afraid, Rape, and Kick questionnaire    Fear of Current or Ex-Partner: No    Emotionally Abused: No    Physically Abused: No    Sexually Abused: No    Past Surgical History:  Procedure Laterality Date   BLADDER REMOVAL  10/1993   Dr. Risa Grill   CAROTID PTA/STENT INTERVENTION Right 11/08/2021   Procedure: CAROTID PTA/STENT INTERVENTION;  Surgeon: Katha Cabal, MD;  Location: [facility];  Service: Cardiovascular;  Laterality: Right;   HERNIA REPAIR  12/05/11   parastomal   LOOP RECORDER INSERTION N/A 04/23/2020   Procedure: LOOP RECORDER INSERTION;  Surgeon: Deboraha Sprang, MD;  Location: [facility];  Service: Cardiovascular;  Laterality: N/A;   PARASTOMAL HERNIA REPAIR  12/05/2011   Procedure: HERNIA REPAIR PARASTOMAL;  Surgeon: Adin Hector, MD;  Location: [facility];  Service: General;;  Laprascopic lysis of adhestons with reduction and  repair with biological mesh for incarcerated parastomal and ventral long incisional hernia.   REVISION UROSTOMY CUTANEOUS     VENTRAL HERNIA REPAIR  12/05/2011   Procedure: HERNIA REPAIR VENTRAL ADULT;  Surgeon: Adin Hector, MD;  Location: [facility];  Service: General;;    Family History  Problem Relation Age of Onset   Heart disease Mother 10       CHF   Alcohol abuse Father    Stroke Father     Allergies  Allergen Reactions   Imodium [Loperamide]     Itchy all over   Loperamide Hcl Rash   Other  Also allergic to a "cough medicine" but patient does not remember the name    Current Outpatient Medications on File Prior to Visit  Medication Sig Dispense Refill   amLODipine (NORVASC) 10 MG tablet Take 1 tablet (10 mg total) by mouth daily. for blood pressure. 90 tablet 1   clopidogrel (PLAVIX) 75 MG tablet Take 1 tablet (75 mg total) by mouth daily. For stroke 90 tablet 3   furosemide (LASIX) 20 MG tablet Take 20 mg by mouth every Monday, Wednesday, and Friday.     rosuvastatin (CRESTOR) 5 MG tablet TAKE 1 TABLET (5 MG TOTAL) BY MOUTH DAILY FOR CHOLESTEROL 90 tablet 0   UNABLE TO FIND HOLISTER UROSTOMY POUCHES ITEM 8478 100 Device 3   aspirin EC 81 MG tablet Take 81 mg by mouth daily. Swallow whole. (Patient not taking: Reported on 03/13/2023)     No current facility-administered medications on file prior to visit.    BP (!) 168/86   Pulse (!) 110   Temp 98.3 F (36.8 C) (Temporal)   Ht 5\' 8"  (1.727 m)   Wt 145 lb (65.8 kg)   SpO2 97%   BMI 22.05 kg/m  Objective:   Physical Exam Eyes:     Extraocular Movements: Extraocular movements intact.  Cardiovascular:     Rate and Rhythm: Regular rhythm. Tachycardia present.  Pulmonary:     Effort: Pulmonary effort is normal.     Breath sounds: Normal breath sounds.  Musculoskeletal:     Cervical back: Neck supple.  Skin:    General: Skin is warm and dry.  Neurological:     Mental Status: She is alert and oriented to  person, place, and time.     Sensory: No sensory deficit.     Gait: Gait abnormal.           Assessment & Plan:  Tachycardia Assessment & Plan: EKG shows atrial tachycardia with no ST elevation, PACs or PVCs.   EKG similar to the last EKG in November.   I evaluated patient, was consulted regarding treatment, and agree with assessment and plan per Tinnie Gens, RN, DNP student.   Allie Bossier, NP-C   Orders: -     EKG 12-Lead -     POCT Urinalysis Dipstick (Automated) -     Basic metabolic panel -     TSH -     Urine Culture  Other fatigue Assessment & Plan: Differentials include acute cystitis, hypothyroidism, anemia  POC urinalysis shows 3+ leuks, 2+ protein, 2+ blood and + nitrates.  Urine culture pending.   Given presentation and symptoms, will treat.  Prescription sent for Bactrim DS BID for five days.   Will check labs to rule out other metabolic causes.   I evaluated patient, was consulted regarding treatment, and agree with assessment and plan per Tinnie Gens, RN, DNP student.   Allie Bossier, NP-C     Orders: -     EKG 12-Lead -     CBC with Differential/Platelet -     DG Chest 2 View -     POCT Urinalysis Dipstick (Automated) -     Basic metabolic panel -     TSH -     Urine Culture -     Sulfamethoxazole-Trimethoprim; Take 1 tablet by mouth 2 (two) times daily for 5 days. Urinary tract infection.  Dispense: 10 tablet; Refill: 0  Persistent cough for 3 weeks or longer Assessment & Plan: Differentials include pneumonia, silent acid reflux, post-viral cough  Chest x-ray pending to rule out pneumonia.   Refill sent for [employer]   Recommend OTC famotidine 20 mg once daily as needed.   I evaluated patient, was consulted regarding treatment, and agree with assessment and plan per Tinnie Gens, RN, DNP student.   Allie Bossier, NP-C   Orders: -     DG Chest 2 View -     Benzonatate; Take 1 capsule (200 mg total) by mouth 3 (three) times  daily as needed for cough.  Dispense: 15 capsule; Refill: 0  Primary hypertension Assessment & Plan: Uncontrolled.   Will change Losartan 100 mg once daily to Valsartan 160 mg once daily.  Continue Amlodipine 10 mg once daily.   Discussed with patients son to continue monitoring blood pressure at home.  Follow up in 2 weeks.   I evaluated patient, was consulted regarding treatment, and agree with assessment and plan per Tinnie Gens, RN, DNP student.   Allie Bossier, NP-C   Orders: -     Valsartan; Take 1 tablet (160 mg total) by mouth daily. For blood pressure.  Dispense: 30 tablet; Refill: 0  Confusion Assessment & Plan: Differentials include acute cystitis, TIA. No red flags on exam.   POC UA shows 3+ leuks, + nitrates, 2+ protein, 2+ blood Urine culture pending.  Give presentation and symptoms, will treat.   Start Bactrim DS one tablet BID for five days.   Consider CT head if symptoms do not improve after treatment.   I evaluated patient, was consulted regarding treatment, and agree with assessment and plan per Tinnie Gens, RN, DNP student.   Allie Bossier, NP-C   Orders: -     POCT Urinalysis Dipstick (Automated) -     Basic metabolic panel -     Urine Culture -     Sulfamethoxazole-Trimethoprim; Take 1 tablet by mouth 2 (two) times daily for 5 days. Urinary tract infection.  Dispense: 10 tablet; Refill: 0        Pleas Koch, NP

## 2023-03-13 NOTE — Assessment & Plan Note (Addendum)
Differentials include acute cystitis, TIA. No red flags on exam.   POC UA shows 3+ leuks, + nitrates, 2+ protein, 2+ blood Urine culture pending.  Give presentation and symptoms, will treat.   Start Bactrim DS one tablet BID for five days.   Consider CT head if symptoms do not improve after treatment.   I evaluated patient, was consulted regarding treatment, and agree with assessment and plan per Tinnie Gens, RN, DNP student.   Allie Bossier, NP-C

## 2023-03-13 NOTE — Assessment & Plan Note (Addendum)
Differentials include pneumonia, silent acid reflux, post-viral cough  Chest x-ray pending to rule out pneumonia.   Refill sent for [employer]   Recommend OTC famotidine 20 mg once daily as needed.   I evaluated patient, was consulted regarding treatment, and agree with assessment and plan per Tinnie Gens, RN, DNP student.   Allie Bossier, NP-C

## 2023-03-14 DIAGNOSIS — J449 Chronic obstructive pulmonary disease, unspecified: Secondary | ICD-10-CM

## 2023-03-14 DIAGNOSIS — N289 Disorder of kidney and ureter, unspecified: Secondary | ICD-10-CM

## 2023-03-14 DIAGNOSIS — R5383 Other fatigue: Secondary | ICD-10-CM

## 2023-03-14 DIAGNOSIS — R410 Disorientation, unspecified: Secondary | ICD-10-CM

## 2023-03-14 LAB — CBC WITH DIFFERENTIAL/PLATELET
Basophils Absolute: 0.1 10*3/uL (ref 0.0–0.1)
Basophils Relative: 0.9 % (ref 0.0–3.0)
Eosinophils Absolute: 0 10*3/uL (ref 0.0–0.7)
Eosinophils Relative: 0 % (ref 0.0–5.0)
HCT: 38.8 % (ref 36.0–46.0)
Hemoglobin: 12.7 g/dL (ref 12.0–15.0)
Lymphocytes Relative: 2.1 % — ABNORMAL LOW (ref 12.0–46.0)
Lymphs Abs: 0.3 10*3/uL — ABNORMAL LOW (ref 0.7–4.0)
MCHC: 32.6 g/dL (ref 30.0–36.0)
MCV: 89.8 fl (ref 78.0–100.0)
Monocytes Absolute: 0.9 10*3/uL (ref 0.1–1.0)
Monocytes Relative: 6.8 % (ref 3.0–12.0)
Neutro Abs: 11.4 10*3/uL — ABNORMAL HIGH (ref 1.4–7.7)
Neutrophils Relative %: 90.2 % — ABNORMAL HIGH (ref 43.0–77.0)
Platelets: 362 10*3/uL (ref 150.0–400.0)
RBC: 4.31 Mil/uL (ref 3.87–5.11)
RDW: 15.7 % — ABNORMAL HIGH (ref 11.5–15.5)
WBC: 12.6 10*3/uL — ABNORMAL HIGH (ref 4.0–10.5)

## 2023-03-14 LAB — BASIC METABOLIC PANEL
BUN: 22 mg/dL (ref 6–23)
CO2: 27 mEq/L (ref 19–32)
Calcium: 9.3 mg/dL (ref 8.4–10.5)
Chloride: 98 mEq/L (ref 96–112)
Creatinine, Ser: 1.77 mg/dL — ABNORMAL HIGH (ref 0.40–1.20)
GFR: 26.13 mL/min — ABNORMAL LOW (ref >60.00)
Glucose, Bld: 169 mg/dL — ABNORMAL HIGH (ref 70–99)
Potassium: 3.9 mEq/L (ref 3.5–5.1)
Sodium: 134 mEq/L — ABNORMAL LOW (ref 135–145)

## 2023-03-14 LAB — URINE CULTURE
MICRO NUMBER:: 14743065
SPECIMEN QUALITY:: ADEQUATE

## 2023-03-14 LAB — TSH: TSH: 0.45 u[IU]/mL (ref 0.35–5.50)

## 2023-03-15 MED ORDER — FLUTICASONE FUROATE-VILANTEROL 100-25 MCG/ACT IN AEPB: 1.0000 | INHALATION_SPRAY | Freq: Every day | RESPIRATORY_TRACT | 3 refills | Status: DC

## 2023-03-25 NOTE — Patient Instructions (Incomplete)
[name] , Thank you for taking time to come for your Medicare Wellness Visit. I appreciate your ongoing commitment to your health goals. Please review the following plan we discussed and let me know if I can assist you in the future.   These are the goals we discussed:  Goals      DIET - REDUCE PORTION SIZE     Keep weight down, love to get off high blood pressure medicine Try to exercise more.         This is a list of the screening recommended for you and due dates:  Health Maintenance  Topic Date Due   DEXA scan (bone density measurement)  Never done   Medicare Annual Wellness Visit  03/23/2023   Flu Shot  07/19/2023   Pneumonia Vaccine  Completed   Zoster (Shingles) Vaccine  Completed   HPV Vaccine  Aged Out   DTaP/Tdap/Td vaccine  Discontinued   COVID-19 Vaccine  Discontinued    Advanced directives: We have a copy of your advanced directives available in your record should your provider ever need to access them.   Conditions/risks identified: Aim for 30 minutes of exercise or brisk walking, 6-8 glasses of water, and 5 servings of fruits and vegetables each day.   Next appointment: Follow up in one year for your annual wellness visit    Preventive Care 65 Years and Older, Female Preventive care refers to lifestyle choices and visits with your health care provider that can promote health and wellness. What does preventive care include? A yearly physical exam. This is also called an annual well check. Dental exams once or twice a year. Routine eye exams. Ask your health care provider how often you should have your eyes checked. Personal lifestyle choices, including: Daily care of your teeth and gums. Regular physical activity. Eating a healthy diet. Avoiding tobacco and drug use. Limiting alcohol use. Practicing safe sex. Taking low-dose aspirin every day. Taking vitamin and mineral supplements as recommended by your health care provider. What happens during an  annual well check? The services and screenings done by your health care provider during your annual well check will depend on your age, overall health, lifestyle risk factors, and family history of disease. Counseling  Your health care provider may ask you questions about your: Alcohol use. Tobacco use. Drug use. Emotional well-being. Home and relationship well-being. Sexual activity. Eating habits. History of falls. Memory and ability to understand (cognition). Work and work Astronomer. Reproductive health. Screening  You may have the following tests or measurements: Height, weight, and BMI. Blood pressure. Lipid and cholesterol levels. These may be checked every 5 years, or more frequently if you are over 19 years old. Skin check. Lung cancer screening. You may have this screening every year starting at age 54 if you have a 30-pack-year history of smoking and currently smoke or have quit within the past 15 years. Fecal occult blood test (FOBT) of the stool. You may have this test every year starting at age 52. Flexible sigmoidoscopy or colonoscopy. You may have a sigmoidoscopy every 5 years or a colonoscopy every 10 years starting at age 66. Hepatitis C blood test. Hepatitis B blood test. Sexually transmitted disease (STD) testing. Diabetes screening. This is done by checking your blood sugar (glucose) after you have not eaten for a while (fasting). You may have this done every 1-3 years. Bone density scan. This is done to screen for osteoporosis. You may have this done starting at age 17.  Mammogram. This may be done every 1-2 years. Talk to your health care provider about how often you should have regular mammograms. Talk with your health care provider about your test results, treatment options, and if necessary, the need for more tests. Vaccines  Your health care provider may recommend certain vaccines, such as: Influenza vaccine. This is recommended every year. Tetanus,  diphtheria, and acellular pertussis (Tdap, Td) vaccine. You may need a Td booster every 10 years. Zoster vaccine. You may need this after age 42. Pneumococcal 13-valent conjugate (PCV13) vaccine. One dose is recommended after age 3. Pneumococcal polysaccharide (PPSV23) vaccine. One dose is recommended after age 53. Talk to your health care provider about which screenings and vaccines you need and how often you need them. This information is not intended to replace advice given to you by your health care provider. Make sure you discuss any questions you have with your health care provider. Document Released: 12/31/2015 Document Revised: 08/23/2016 Document Reviewed: 10/05/2015 Elsevier Interactive Patient Education  2017 [employer] Prevention in the Home Falls can cause injuries. They can happen to people of all ages. There are many things you can do to make your home safe and to help prevent falls. What can I do on the outside of my home? Regularly fix the edges of walkways and driveways and fix any cracks. Remove anything that might make you trip as you walk through a door, such as a raised step or threshold. Trim any bushes or trees on the path to your home. Use bright outdoor lighting. Clear any walking paths of anything that might make someone trip, such as rocks or tools. Regularly check to see if handrails are loose or broken. Make sure that both sides of any steps have handrails. Any raised decks and porches should have guardrails on the edges. Have any leaves, snow, or ice cleared regularly. Use sand or salt on walking paths during winter. Clean up any spills in your garage right away. This includes oil or grease spills. What can I do in the bathroom? Use night lights. Install grab bars by the toilet and in the tub and shower. Do not use towel bars as grab bars. Use non-skid mats or decals in the tub or shower. If you need to sit down in the shower, use a plastic,  non-slip stool. Keep the floor dry. Clean up any water that spills on the floor as soon as it happens. Remove soap buildup in the tub or shower regularly. Attach bath mats securely with double-sided non-slip rug tape. Do not have throw rugs and other things on the floor that can make you trip. What can I do in the bedroom? Use night lights. Make sure that you have a light by your bed that is easy to reach. Do not use any sheets or blankets that are too big for your bed. They should not hang down onto the floor. Have a firm chair that has side arms. You can use this for support while you get dressed. Do not have throw rugs and other things on the floor that can make you trip. What can I do in the kitchen? Clean up any spills right away. Avoid walking on wet floors. Keep items that you use a lot in easy-to-reach places. If you need to reach something above you, use a strong step stool that has a grab bar. Keep electrical cords out of the way. Do not use floor polish or wax that makes floors slippery. If  you must use wax, use non-skid floor wax. Do not have throw rugs and other things on the floor that can make you trip. What can I do with my stairs? Do not leave any items on the stairs. Make sure that there are handrails on both sides of the stairs and use them. Fix handrails that are broken or loose. Make sure that handrails are as long as the stairways. Check any carpeting to make sure that it is firmly attached to the stairs. Fix any carpet that is loose or worn. Avoid having throw rugs at the top or bottom of the stairs. If you do have throw rugs, attach them to the floor with carpet tape. Make sure that you have a light switch at the top of the stairs and the bottom of the stairs. If you do not have them, ask someone to add them for you. What else can I do to help prevent falls? Wear shoes that: Do not have high heels. Have rubber bottoms. Are comfortable and fit you well. Are closed  at the toe. Do not wear sandals. If you use a stepladder: Make sure that it is fully opened. Do not climb a closed stepladder. Make sure that both sides of the stepladder are locked into place. Ask someone to hold it for you, if possible. Clearly mark and make sure that you can see: Any grab bars or handrails. First and last steps. Where the edge of each step is. Use tools that help you move around (mobility aids) if they are needed. These include: Canes. Walkers. Scooters. Crutches. Turn on the lights when you go into a dark area. Replace any light bulbs as soon as they burn out. Set up your furniture so you have a clear path. Avoid moving your furniture around. If any of your floors are uneven, fix them. If there are any pets around you, be aware of where they are. Review your medicines with your doctor. Some medicines can make you feel dizzy. This can increase your chance of falling. Ask your doctor what other things that you can do to help prevent falls. This information is not intended to replace advice given to you by your health care provider. Make sure you discuss any questions you have with your health care provider. Document Released: 09/30/2009 Document Revised: 05/11/2016 Document Reviewed: 01/08/2015 Elsevier Interactive Patient Education  2017 [employer].

## 2023-03-25 NOTE — Progress Notes (Unsigned)
Subjective:   Sara Aguirre is a 82 y.o. female who presents for Medicare Annual (Subsequent) preventive examination.  Review of Systems    ***       Objective:    There were no vitals filed for this visit. There is no height or weight on file to calculate BMI.     11/02/2022    6:34 PM 05/27/2022    9:57 PM 05/27/2022    5:22 PM 03/22/2022    2:10 PM 11/08/2021    4:58 PM 10/08/2021    6:20 PM 10/08/2021    2:42 PM  Advanced Directives  Does Patient Have a Medical Advance Directive? No Yes No Yes Yes No No  Type of Special educational needs teacher of [place];Living will  Healthcare Power of [place];Living will Healthcare Power of Attorney    Does patient want to make changes to medical advance directive?  No - Patient declined   Yes (Inpatient - patient defers changing a medical advance directive at this time - Information given)    Copy of Healthcare Power of Attorney in Chart?  No - copy requested  Yes - validated most recent copy scanned in chart (See row information) No - copy requested    Would patient like information on creating a medical advance directive?  No - Patient declined No - Patient declined   No - Patient declined     Current Medications (verified) Outpatient Encounter Medications as of 03/26/2023  Medication Sig   amLODipine (NORVASC) 10 MG tablet Take 1 tablet (10 mg total) by mouth daily. for blood pressure.   aspirin EC 81 MG tablet Take 81 mg by mouth daily. Swallow whole. (Patient not taking: Reported on 03/13/2023)   benzonatate (TESSALON) 200 MG capsule Take 1 capsule (200 mg total) by mouth 3 (three) times daily as needed for cough.   clopidogrel (PLAVIX) 75 MG tablet Take 1 tablet (75 mg total) by mouth daily. For stroke   fluticasone furoate-vilanterol (BREO ELLIPTA) 100-25 MCG/ACT AEPB Inhale 1 puff into the lungs daily.   furosemide (LASIX) 20 MG tablet Take 20 mg by mouth every Monday, Wednesday, and Friday.   rosuvastatin (CRESTOR) 5 MG  tablet TAKE 1 TABLET (5 MG TOTAL) BY MOUTH DAILY FOR CHOLESTEROL   UNABLE TO FIND HOLISTER UROSTOMY POUCHES ITEM 8478   valsartan (DIOVAN) 160 MG tablet Take 1 tablet (160 mg total) by mouth daily. For blood pressure.   No facility-administered encounter medications on file as of 03/26/2023.    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 Isabel Caprice   CAROTID PTA/STENT INTERVENTION Right 11/08/2021   Procedure: CAROTID PTA/STENT INTERVENTION;  Surgeon: Renford Dills, MD;  Location: [facility] INVASIVE CV LAB;  Service: Cardiovascular;  Laterality: Right;   HERNIA REPAIR  12/05/11   parastomal   LOOP RECORDER INSERTION N/A 04/23/2020   Procedure: LOOP RECORDER INSERTION;  Surgeon: Duke Salvia, MD;  [facility]: [facility] INVASIVE CV LAB;  Service: Cardiovascular;  Laterality: N/A;   PARASTOMAL HERNIA REPAIR  12/05/2011   Procedure: HERNIA REPAIR PARASTOMAL;  Surgeon: Ardeth Sportsman, MD;  Location: [facility] ORS;  Service: General;;  Laprascopic lysis of adhestons with reduction and repair with biological mesh for incarcerated parastomal and ventral long incisional hernia.   REVISION UROSTOMY CUTANEOUS     VENTRAL HERNIA REPAIR  12/05/2011   Procedure: HERNIA REPAIR VENTRAL ADULT;  Surgeon: Ardeth Sportsman, MD;  Location: [facility] ORS;  Service: General;;   Family History  Problem Relation Age of Onset   Heart disease Mother 23       CHF   Alcohol abuse Father    Stroke Father    Social History   Socioeconomic  History   Marital status: Widowed    Spouse name: Not on file   Number of children: 2   Years of education: Not on file   Highest education level: Some college, no degree  Occupational History   Occupation: retired  Tobacco Use   Smoking status: Former    Types: Cigarettes    Quit date: 01/02/1993    Years since quitting: 30.2   Smokeless tobacco: Never  Vaping Use   Vaping Use: Never used  Substance and Sexual Activity   Alcohol use: No   Drug use: No   Sexual activity: Not on file  Other Topics Concern   Not on file  Social History Narrative   Marital status: widowed since 1992; not dating; not interested      Children:  Twin boys (45); 2 grandchildren      Lives: alone with dog, 5 chickens, 2 rabbits      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  Gardening      ADLs: drives; independent with ADLs; has garden      Advanced Directives: YES: full code but no prolonged measures; HCPOA: Conception Chancy or other son Rowe Clack; copy on chart 04/2017.   Social Determinants of Health   Financial Resource Strain: Low Risk  (03/22/2022)   Overall Financial Resource Strain (CARDIA)    Difficulty of Paying Living Expenses: Not hard at all  Food Insecurity: No Food Insecurity (03/22/2022)   Hunger Vital Sign    Worried About Running Out of Food in the Last Year: Never true    Ran Out of Food in the Last Year: Never true  Transportation Needs: No Transportation Needs (03/22/2022)   PRAPARE - Administrator, Civil Service (Medical): No    Lack of Transportation (Non-Medical): No  Physical Activity: Insufficiently Active (03/22/2022)   Exercise Vital Sign    Days of Exercise per Week: 3 days    Minutes of Exercise per Session: 30 min  Stress: No Stress Concern Present (03/22/2022)   [employer] of Occupational Health -  Occupational Stress Questionnaire    Feeling of Stress : Not at all  Social Connections: Socially Isolated (03/22/2022)   Social Connection and Isolation Panel [NHANES]    Frequency of Communication with Friends and Family: More than three times a week    Frequency of Social Gatherings with Friends and Family: More than three times a week    Attends Religious Services: Never    Database administrator or Organizations: No    Attends Club or  Organization Meetings: Never    Marital Status: Widowed    Tobacco Counseling Counseling given: Not Answered   Clinical Intake:                 Diabetic?No          Activities of Daily Living    05/27/2022    9:57 PM  In your present state of health, do you have any difficulty performing the following activities:  Hearing? 0  Vision? 0  Difficulty concentrating or making decisions? 0  Walking or climbing stairs? 0  Dressing or bathing? 0  Doing errands, shopping? 0    Patient Care Team: Doreene Nestlark, Katherine K, NP as PCP - General (Internal Medicine)  Indicate any recent Medical Services you may have received from other than [facility] providers in the past year (date may be approximate).     Assessment:   This is a routine wellness examination for Sara Aguirre.  Hearing/Vision screen No results found.  Dietary issues and exercise activities discussed:     Goals Addressed   None    Depression Screen    03/22/2022    2:01 PM 11/04/2020    1:34 PM 08/26/2020    9:20 AM 07/12/2020    9:33 AM 06/25/2020    1:02 PM 05/27/2020    2:15 PM 05/26/2020    2:03 PM  PHQ 2/9 Scores  PHQ - 2 Score 0 0 0 0 0 0 0    Fall Risk    03/22/2022    2:11 PM 11/04/2020    1:34 PM 08/26/2020    9:20 AM 07/12/2020    9:33 AM 06/25/2020    1:01 PM  Fall Risk   Falls in the past year? 0 0 0 0 0  Number falls in past yr: 0  0 0   Injury with Fall? 0  0 0   Risk for fall due to : Impaired balance/gait   No Fall Risks   Follow up Falls prevention discussed  Falls evaluation completed Falls evaluation completed Falls evaluation completed Falls evaluation completed    FALL RISK PREVENTION PERTAINING TO THE HOME:  Any stairs in or around the home? {YES/NO:21197} If so, are there any without handrails? {YES/NO:21197} Home free of loose throw rugs in walkways, pet beds, electrical cords, etc? {YES/NO:21197} Adequate lighting in your home to reduce risk of falls? {YES/NO:21197}  ASSISTIVE DEVICES UTILIZED TO PREVENT FALLS:  Life alert? {YES/NO:21197} Use of a cane, [place] or w/c? {YES/NO:21197} Grab bars in the bathroom? {YES/NO:21197} Shower chair or bench in shower? {YES/NO:21197} Elevated toilet seat or a handicapped toilet? {YES/NO:21197}  TIMED UP AND GO:  Was the test performed? No . Telephonic visit   Cognitive Function:        03/22/2022    2:14 PM 03/26/2019   10:07 AM 01/24/2018    9:47 AM  6CIT Screen  What Year? 0 points 0 points 0 points  What month? 0 points 0 points 0 points  What time? 0 points 0 points 0 points  Count back from 20 0 points 0 points 0 points  Months in reverse 0 points 0 points 0 points  Repeat phrase 0 points 0 points 2 points  Total Score 0 points 0 points 2 points    Immunizations Immunization History  Administered Date(s) Administered   Influenza, High Dose Seasonal PF 10/01/2017, 08/21/2018, 08/08/2019   Influenza,inj,Quad PF,6+ Mos 01/17/2017   Influenza-Unspecified 10/07/2015, 08/21/2018, 08/08/2019, 08/22/2020   PFIZER(Purple Top)SARS-COV-2 Vaccination 02/27/2020, 03/19/2020  Pneumococcal Conjugate-13 01/17/2017, 08/08/2019   Pneumococcal Polysaccharide-23 04/19/2018   Zoster Recombinat (Shingrix) 09/21/2019, 11/27/2019    TDAP status: Due, Education has been provided regarding the importance of this vaccine. Advised may receive this vaccine at local pharmacy or Health Dept. Aware to provide a copy of the vaccination record if obtained from local pharmacy or Health Dept. Verbalized  acceptance and understanding.  Flu Vaccine status: Up to date  Pneumococcal vaccine status: Up to date  Covid-19 vaccine status: Information provided on how to obtain vaccines.   Qualifies for Shingles Vaccine? Yes   Zostavax completed No   Shingrix Completed?: Yes  Screening Tests Health Maintenance  Topic Date Due   DEXA SCAN  Never done   Medicare Annual Wellness (AWV)  03/23/2023   INFLUENZA VACCINE  07/19/2023   Pneumonia Vaccine 35+ Years old  Completed   Zoster Vaccines- Shingrix  Completed   HPV VACCINES  Aged Out   DTaP/Tdap/Td  Discontinued   COVID-19 Vaccine  Discontinued    Health Maintenance  Health Maintenance Due  Topic Date Due   DEXA SCAN  Never done   Medicare Annual Wellness (AWV)  03/23/2023    Colorectal cancer screening: No longer required.   Mammogram status: No longer required due to age .  {Bone Density status:21018021}  Lung Cancer Screening: (Low Dose CT Chest recommended if Age 35-80 years, 30 pack-year currently smoking OR have quit w/in 15years.) does not qualify.   Lung Cancer Screening Referral: n/a  Additional Screening:  Hepatitis C Screening: does not qualify  Vision Screening: Recommended annual ophthalmology exams for early detection of glaucoma and other disorders of the eye. Is the patient up to date with their annual eye exam?  {YES/NO:21197} Who is the provider or what is the name of the office in which the patient attends annual eye exams? *** If pt is not established with a provider, would they like to be referred to a provider to establish care? {YES/NO:21197}.   Dental Screening: Recommended annual dental exams for proper oral hygiene  Community Resource Referral / Chronic Care Management: CRR required this visit?  {YES/NO:21197}  CCM required this visit?  {YES/NO:21197}     Plan:     I have personally reviewed and noted the following in the patient's chart:   Medical and social history Use of alcohol,  tobacco or illicit drugs  Current medications and supplements including opioid prescriptions. {Opioid [phone]} Functional ability and status Nutritional status Physical activity Advanced directives List of other physicians Hospitalizations, surgeries, and ER visits in previous 12 months Vitals Screenings to include cognitive, depression, and falls Referrals and appointments  In addition, I have reviewed and discussed with patient certain preventive protocols, quality metrics, and best practice recommendations. A written personalized care plan for preventive services as well as general preventive health recommendations were provided to patient.     Durwin Nora, [place]   12/23/1094   Due to this being a virtual visit, the after visit summary with patients personalized plan was offered to patient via mail or my-chart. ***Patient declined at this time./ Patient would like to access on my-chart/ per request, patient was mailed a copy of AVS./ Patient preferred to pick up at office at next visit  Nurse Notes: ***

## 2023-03-26 ENCOUNTER — Ambulatory Visit (INDEPENDENT_AMBULATORY_CARE_PROVIDER_SITE_OTHER): Payer: PPO

## 2023-03-26 VITALS — Ht 68.0 in | Wt 145.0 lb

## 2023-03-26 DIAGNOSIS — Z0000 Encounter for general adult medical examination without abnormal findings: Secondary | ICD-10-CM

## 2023-03-27 ENCOUNTER — Encounter: Payer: Self-pay | Admitting: Primary Care

## 2023-03-27 ENCOUNTER — Ambulatory Visit (INDEPENDENT_AMBULATORY_CARE_PROVIDER_SITE_OTHER): Payer: PPO | Admitting: Primary Care

## 2023-03-27 VITALS — BP 148/74 | HR 75 | Temp 97.2°F | Ht 68.0 in | Wt 145.0 lb

## 2023-03-27 DIAGNOSIS — I10 Essential (primary) hypertension: Secondary | ICD-10-CM

## 2023-03-27 DIAGNOSIS — R053 Chronic cough: Secondary | ICD-10-CM | POA: Diagnosis not present

## 2023-03-27 DIAGNOSIS — N1832 Chronic kidney disease, stage 3b: Secondary | ICD-10-CM

## 2023-03-27 NOTE — Patient Instructions (Addendum)
Continue valsartan 160 mg daily for blood pressure.  Please schedule a physical to meet with me in 6 months.   It was a pleasure to see you today!

## 2023-03-27 NOTE — Assessment & Plan Note (Signed)
Improved and home readings at goal!  Continue valsartan 160 mg daily. She will have nephrology visit this week who will check her renal function later this week.

## 2023-03-27 NOTE — Assessment & Plan Note (Signed)
Follow up with nephrology later this week as scheduled.

## 2023-03-27 NOTE — Assessment & Plan Note (Signed)
Resolved.  Will discontinue Breo Ellipta inhaler as she never took.

## 2023-03-27 NOTE — Progress Notes (Signed)
Subjective:    Patient ID: Sara Aguirre, female    DOB: [birth year], 82 y.o.   MRN: [id number]  HPI  Sara Aguirre is a very pleasant 82 y.o. female with a history of hypertension, CKD, confusion, bladder cancer, cystitis, TIA, CVA who presents today for follow up of hypertension and persistent cough.  1) Hypertension: She was last evaluated on 03/13/23. During this visit her BP was above goal (also during several prior visits) so we stopped her losartan 100 mg and started valsartan 160 mg daily. We continued her amlodipine 10 mg daily.  Since her last visit she is compliant to valsartan 160 mg and amlodipine 10 mg daily. She is checking her BP at home, last week her BP was 115/61. She thinks she had a 133/76 reading too.   BP Readings from Last 3 Encounters:  03/27/23 (!) 148/74  03/13/23 (!) 168/86  11/29/22 (!) 162/80   2) Persistent Cough: Chronic since December 2023 after treated for URI. During her visit last month she underwent chest xray which was negative for pneumonia but positive for COPD. Given this she was prescribe Breo Ellipta 100-25 mcg daily.   Since her last visit her cough has resolved. She never picked up the [facility] inhaler.   Wt Readings from Last 3 Encounters:  03/27/23 145 lb (65.8 kg)  03/26/23 145 lb (65.8 kg)  03/13/23 145 lb (65.8 kg)     Review of Systems  Constitutional:  Negative for fatigue.  Respiratory:  Negative for cough and shortness of breath.   Neurological:  Negative for dizziness and headaches.         Past Medical History:  Diagnosis Date   Cancer    bladder, s/p cystectomy with ileal conduit   Hematuria 09/27/2021   History of bladder cancer    History of tobacco use    Hyperlipidemia    Hypertension    Incarcerated ventral hernia, s/p lap repair 12/05/2011   Parastomal hernia of ileal conduit, s/p lap repair [id number] 12/04/2011   Ulcer, gastric, acute or chronic     Social History   Socioeconomic History   Marital  status: Widowed    Spouse name: Not on file   Number of children: 2   Years of education: Not on file   Highest education level: Some college, no degree  Occupational History   Occupation: retired  Tobacco Use   Smoking status: Former    Types: Cigarettes    Quit date: 01/02/1993    Years since quitting: 30.2   Smokeless tobacco: Never  Vaping Use   Vaping Use: Never used  Substance and Sexual Activity   Alcohol use: No   Drug use: No   Sexual activity: Not on file  Other Topics Concern   Not on file  Social History Narrative   Marital status: widowed since 1992; not dating; not interested      Children:  Twin boys (45); 2 grandchildren      Lives: alone with dog, 5 chickens, 2 rabbits      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  Gardening      ADLs: drives; independent with ADLs; has garden      Advanced Directives: YES: full code but no prolonged measures; HCPOA: Conception Chancy or other son Loraine Leriche  Ballard/son; copy on chart 04/2017.   [place]    Social Determinants of Health   Financial Resource Strain: Low Risk  (03/26/2023)   Overall Financial Resource Strain (CARDIA)    Difficulty of Paying Living Expenses: Not hard at all  Food Insecurity: No Food Insecurity (03/26/2023)   Hunger Vital Sign    Worried About Running Out of Food in the Last Year: Never true    Ran Out of Food in the Last Year: Never true  Transportation Needs: No Transportation Needs (03/26/2023)   PRAPARE - Administrator, Civil ServiceTransportation    Lack of Transportation (Medical): No    Lack of Transportation (Non-Medical): No  Physical Activity: Insufficiently Active (03/26/2023)   Exercise Vital Sign    Days of Exercise per Week: 3 days    Minutes of Exercise per Session: 30 min  Stress: No Stress Concern Present (03/26/2023)   [employer] - Occupational Stress Questionnaire    Feeling of Stress : Not at all  Social Connections: Moderately Isolated (03/26/2023)   Social Connection and Isolation Panel [NHANES]    Frequency of Communication with Friends and Family: More than three times a week    Frequency of Social Gatherings with Friends and Family: More than three times a week    Attends Religious Services: More than 4 times per year    Active Member of [employer]ubs or Organizations: No    Attends BankerClub or Organization Meetings: Never    Marital Status: Widowed  Intimate Partner Violence: Not At Risk (03/26/2023)   Humiliation, Afraid, Rape, and Kick questionnaire    Fear of Current or Ex-Partner: No    Emotionally Abused: No    Physically Abused: No    Sexually Abused: No    Past Surgical History:  Procedure Laterality Date   BLADDER REMOVAL  10/1993   Dr. Isabel CapriceGrapey   CAROTID PTA/STENT INTERVENTION Right 11/08/2021   Procedure: CAROTID PTA/STENT INTERVENTION;  Surgeon: Renford DillsSchnier, Gregory G, MD;  Location: [facility] INVASIVE CV LAB;  Service: Cardiovascular;  Laterality: Right;   HERNIA REPAIR  12/05/11   parastomal   LOOP RECORDER INSERTION N/A 04/23/2020   Procedure: LOOP RECORDER INSERTION;  Surgeon: Duke SalviaKlein, Steven C, MD;  [facility];  Service: Cardiovascular;  Laterality: N/A;   PARASTOMAL HERNIA REPAIR  12/05/2011   Procedure: HERNIA REPAIR PARASTOMAL;  Surgeon: Ardeth SportsmanSteven C. Gross, MD;  Location: [facility] ORS;  Service: General;;  Laprascopic lysis of adhestons with reduction and repair with biological mesh for incarcerated parastomal and ventral long incisional hernia.   REVISION UROSTOMY CUTANEOUS     VENTRAL HERNIA REPAIR  12/05/2011   Procedure: HERNIA REPAIR VENTRAL ADULT;  Surgeon: Ardeth SportsmanSteven C. Gross, MD;  Location: [facility] OR[facility];  Service: General;;    Family History  Problem Relation Age of Onset   Heart disease Mother [id number]       CHF   Alcohol abuse Father    Stroke Father     Allergies  Allergen Reactions    Imodium [Loperamide]     Itchy all over   Loperamide Hcl Rash   Other     Also allergic to a "cough medicine" but patient does not remember the name    Current Outpatient Medications on File Prior to Visit  Medication Sig Dispense Refill   amLODipine (NORVASC) 10 MG tablet Take 1 tablet (10 mg total) by mouth daily. for blood pressure. 90 tablet 1   clopidogrel (PLAVIX) 75 MG tablet Take 1 tablet (75  mg total) by mouth daily. For stroke 90 tablet 3   furosemide (LASIX) 20 MG tablet Take 20 mg by mouth every Monday, Wednesday, and Friday.     Multiple Vitamin (MULTIVITAMIN WITH MINERALS) TABS tablet Take 1 tablet by mouth daily.     rosuvastatin (CRESTOR) 5 MG tablet TAKE 1 TABLET (5 MG TOTAL) BY MOUTH DAILY FOR CHOLESTEROL 90 tablet 0   UNABLE TO FIND HOLISTER UROSTOMY POUCHES ITEM 8478 100 Device 3   valsartan (DIOVAN) 160 MG tablet Take 1 tablet (160 mg total) by mouth daily. For blood pressure. 30 tablet 0   vitamin B-12 (CYANOCOBALAMIN) 50 MCG tablet Take 50 mcg by mouth daily.     aspirin EC 81 MG tablet Take 81 mg by mouth daily. Swallow whole. (Patient not taking: Reported on 03/13/2023)     benzonatate (TESSALON) 200 MG capsule Take 1 capsule (200 mg total) by mouth 3 (three) times daily as needed for cough. (Patient not taking: Reported on 03/27/2023) 15 capsule 0   No current facility-administered medications on file prior to visit.    BP (!) 148/74   Pulse 75   Temp (!) 97.2 F (36.2 C) (Temporal)   Ht 5\' 8"  (1.727 m)   Wt 145 lb (65.8 kg)   SpO2 98%   BMI 22.05 kg/m  Objective:   Physical Exam Cardiovascular:     Rate and Rhythm: Normal rate and regular rhythm.  Pulmonary:     Effort: Pulmonary effort is normal.     Breath sounds: Normal breath sounds.  Musculoskeletal:     Cervical back: Neck supple.  Skin:    General: Skin is warm and dry.           Assessment & Plan:  Primary hypertension Assessment & Plan: Improved and home readings at  goal!  Continue valsartan 160 mg daily. She will have nephrology visit this week who will check her renal function later this week.      Persistent cough for 3 weeks or longer Assessment & Plan: Resolved.  Will discontinue Breo Ellipta inhaler as she never took.    Stage 3b chronic kidney disease Assessment & Plan: Follow up with nephrology later this week as scheduled.          Doreene Nest, NP

## 2023-03-29 DIAGNOSIS — N1832 Chronic kidney disease, stage 3b: Secondary | ICD-10-CM | POA: Diagnosis not present

## 2023-03-29 DIAGNOSIS — R809 Proteinuria, unspecified: Secondary | ICD-10-CM | POA: Diagnosis not present

## 2023-03-29 DIAGNOSIS — I129 Hypertensive chronic kidney disease with stage 1 through stage 4 chronic kidney disease, or unspecified chronic kidney disease: Secondary | ICD-10-CM | POA: Diagnosis not present

## 2023-03-29 NOTE — Telephone Encounter (Signed)
Forms completed and placed in Kelli's inbox.

## 2023-03-29 NOTE — Telephone Encounter (Signed)
FMLA for care taker forms received for completion for patients son, Sara Aguirre. Patient son has been informed that process may take up to 5 business days.  Employer Name: [employer] Reason for being out: Caring for patient, taking her to appointments   Patients son is requesting start date of 03/27/23 (4 hours), 03/29/23 (8 hours) ,04/30/23 (8 hours). Would like to have intermittent FMLA for up to 2 full days (16 hours) a month.    Verified with patient that it is ok to leave Voicemail updates on  Mobile [phone] (mobile)  Patient would like to pick up copy in our office when form is completed.   Fax number form should be sent to is [phone]   FMLA ppw required patients son signature, he is coming by to sign these today.

## 2023-04-04 ENCOUNTER — Other Ambulatory Visit: Payer: Self-pay | Admitting: Primary Care

## 2023-04-04 DIAGNOSIS — I10 Essential (primary) hypertension: Secondary | ICD-10-CM

## 2023-05-01 DIAGNOSIS — Z8551 Personal history of malignant neoplasm of bladder: Secondary | ICD-10-CM | POA: Diagnosis not present

## 2023-05-01 DIAGNOSIS — N2889 Other specified disorders of kidney and ureter: Secondary | ICD-10-CM | POA: Diagnosis not present

## 2023-05-13 ENCOUNTER — Other Ambulatory Visit: Payer: Self-pay | Admitting: Primary Care

## 2023-05-13 DIAGNOSIS — E782 Mixed hyperlipidemia: Secondary | ICD-10-CM

## 2023-05-22 DIAGNOSIS — N2889 Other specified disorders of kidney and ureter: Secondary | ICD-10-CM | POA: Diagnosis not present

## 2023-05-30 DIAGNOSIS — N2889 Other specified disorders of kidney and ureter: Secondary | ICD-10-CM | POA: Diagnosis not present

## 2023-06-28 DIAGNOSIS — R319 Hematuria, unspecified: Secondary | ICD-10-CM | POA: Diagnosis not present

## 2023-06-28 DIAGNOSIS — I129 Hypertensive chronic kidney disease with stage 1 through stage 4 chronic kidney disease, or unspecified chronic kidney disease: Secondary | ICD-10-CM | POA: Diagnosis not present

## 2023-06-28 DIAGNOSIS — R809 Proteinuria, unspecified: Secondary | ICD-10-CM | POA: Diagnosis not present

## 2023-06-28 DIAGNOSIS — N184 Chronic kidney disease, stage 4 (severe): Secondary | ICD-10-CM | POA: Diagnosis not present

## 2023-08-02 DIAGNOSIS — N2889 Other specified disorders of kidney and ureter: Secondary | ICD-10-CM | POA: Diagnosis not present

## 2023-08-02 DIAGNOSIS — R319 Hematuria, unspecified: Secondary | ICD-10-CM | POA: Diagnosis not present

## 2023-08-02 DIAGNOSIS — N184 Chronic kidney disease, stage 4 (severe): Secondary | ICD-10-CM | POA: Diagnosis not present

## 2023-08-02 DIAGNOSIS — N1832 Chronic kidney disease, stage 3b: Secondary | ICD-10-CM | POA: Diagnosis not present

## 2023-08-02 DIAGNOSIS — R310 Gross hematuria: Secondary | ICD-10-CM | POA: Diagnosis not present

## 2023-08-02 DIAGNOSIS — R809 Proteinuria, unspecified: Secondary | ICD-10-CM | POA: Diagnosis not present

## 2023-08-02 DIAGNOSIS — I129 Hypertensive chronic kidney disease with stage 1 through stage 4 chronic kidney disease, or unspecified chronic kidney disease: Secondary | ICD-10-CM | POA: Diagnosis not present

## 2023-09-11 ENCOUNTER — Other Ambulatory Visit: Payer: Self-pay | Admitting: Primary Care

## 2023-09-11 DIAGNOSIS — I10 Essential (primary) hypertension: Secondary | ICD-10-CM

## 2023-09-20 NOTE — Telephone Encounter (Signed)
PPW placed in our box for review.

## 2023-09-20 NOTE — Telephone Encounter (Signed)
Form completed and placed in Kelli's inbox

## 2023-09-20 NOTE — Telephone Encounter (Signed)
Placed up front with reception.

## 2023-09-26 ENCOUNTER — Encounter: Payer: PPO | Admitting: Primary Care

## 2023-09-29 ENCOUNTER — Other Ambulatory Visit: Payer: Self-pay | Admitting: Primary Care

## 2023-09-29 DIAGNOSIS — I10 Essential (primary) hypertension: Secondary | ICD-10-CM

## 2023-10-04 DIAGNOSIS — I639 Cerebral infarction, unspecified: Secondary | ICD-10-CM

## 2023-10-04 MED ORDER — CLOPIDOGREL BISULFATE 75 MG PO TABS: 75.0000 mg | ORAL_TABLET | Freq: Every day | ORAL | 0 refills | Status: DC

## 2023-10-06 ENCOUNTER — Other Ambulatory Visit: Payer: Self-pay | Admitting: Primary Care

## 2023-10-06 DIAGNOSIS — I10 Essential (primary) hypertension: Secondary | ICD-10-CM

## 2023-10-08 ENCOUNTER — Other Ambulatory Visit: Payer: Self-pay | Admitting: Primary Care

## 2023-10-08 DIAGNOSIS — I639 Cerebral infarction, unspecified: Secondary | ICD-10-CM

## 2023-11-13 NOTE — Telephone Encounter (Signed)
Kelli, can you see if they faxed this form? It should come from [facility]

## 2023-11-15 ENCOUNTER — Other Ambulatory Visit: Payer: Self-pay | Admitting: Primary Care

## 2023-11-15 DIAGNOSIS — E782 Mixed hyperlipidemia: Secondary | ICD-10-CM

## 2023-11-20 NOTE — Telephone Encounter (Signed)
Form has been printed and placed in your inbox.

## 2023-11-22 ENCOUNTER — Telehealth: Payer: Self-pay

## 2023-11-22 DIAGNOSIS — Z936 Other artificial openings of urinary tract status: Secondary | ICD-10-CM | POA: Diagnosis not present

## 2023-11-22 NOTE — Patient Outreach (Signed)
  Care Coordination   11/22/2023 Name: Sara Aguirre MRN: [id number] DOB: [birth year]   Care Coordination Outreach Attempts:  An unsuccessful telephone outreach was attempted today to offer the patient information about available care coordination services. HIPAA compliant message left.   Follow Up Plan:  Additional outreach attempts will be made to offer the patient care coordination information and services.   Encounter Outcome:  No Answer   Care Coordination Interventions:  No, not indicated    George Ina RN,BSN,CCM [facility]  [facility], [facility] coordinator / Case Manager Phone: [phone]

## 2023-12-26 ENCOUNTER — Other Ambulatory Visit: Payer: Self-pay | Admitting: Primary Care

## 2023-12-26 DIAGNOSIS — I10 Essential (primary) hypertension: Secondary | ICD-10-CM

## 2023-12-26 DIAGNOSIS — I639 Cerebral infarction, unspecified: Secondary | ICD-10-CM

## 2023-12-27 NOTE — Telephone Encounter (Signed)
 Lvmtcb, sent mychart message

## 2023-12-27 NOTE — Telephone Encounter (Signed)
 Patient is due for CPE/follow up in late March, this will be required prior to any further refills.  Please schedule, thank you!

## 2024-01-03 DIAGNOSIS — R809 Proteinuria, unspecified: Secondary | ICD-10-CM | POA: Diagnosis not present

## 2024-01-03 DIAGNOSIS — N2889 Other specified disorders of kidney and ureter: Secondary | ICD-10-CM | POA: Diagnosis not present

## 2024-01-03 DIAGNOSIS — I129 Hypertensive chronic kidney disease with stage 1 through stage 4 chronic kidney disease, or unspecified chronic kidney disease: Secondary | ICD-10-CM | POA: Diagnosis not present

## 2024-01-03 DIAGNOSIS — N184 Chronic kidney disease, stage 4 (severe): Secondary | ICD-10-CM | POA: Diagnosis not present

## 2024-01-03 DIAGNOSIS — R319 Hematuria, unspecified: Secondary | ICD-10-CM | POA: Diagnosis not present

## 2024-01-06 ENCOUNTER — Other Ambulatory Visit: Payer: Self-pay | Admitting: Primary Care

## 2024-01-06 DIAGNOSIS — I10 Essential (primary) hypertension: Secondary | ICD-10-CM

## 2024-02-11 ENCOUNTER — Other Ambulatory Visit: Payer: Self-pay | Admitting: Primary Care

## 2024-02-11 DIAGNOSIS — E782 Mixed hyperlipidemia: Secondary | ICD-10-CM

## 2024-03-13 ENCOUNTER — Ambulatory Visit (INDEPENDENT_AMBULATORY_CARE_PROVIDER_SITE_OTHER): Payer: PPO | Admitting: Primary Care

## 2024-03-13 ENCOUNTER — Encounter: Payer: Self-pay | Admitting: Primary Care

## 2024-03-13 VITALS — BP 138/66 | HR 86 | Temp 97.2°F | Ht 68.0 in | Wt 166.0 lb

## 2024-03-13 DIAGNOSIS — Z0000 Encounter for general adult medical examination without abnormal findings: Secondary | ICD-10-CM

## 2024-03-13 DIAGNOSIS — I10 Essential (primary) hypertension: Secondary | ICD-10-CM | POA: Diagnosis not present

## 2024-03-13 DIAGNOSIS — N1832 Chronic kidney disease, stage 3b: Secondary | ICD-10-CM

## 2024-03-13 DIAGNOSIS — E7800 Pure hypercholesterolemia, unspecified: Secondary | ICD-10-CM

## 2024-03-13 DIAGNOSIS — Z8551 Personal history of malignant neoplasm of bladder: Secondary | ICD-10-CM

## 2024-03-13 DIAGNOSIS — N2889 Other specified disorders of kidney and ureter: Secondary | ICD-10-CM

## 2024-03-13 DIAGNOSIS — R7303 Prediabetes: Secondary | ICD-10-CM | POA: Diagnosis not present

## 2024-03-13 DIAGNOSIS — Z8673 Personal history of transient ischemic attack (TIA), and cerebral infarction without residual deficits: Secondary | ICD-10-CM

## 2024-03-13 LAB — LIPID PANEL
Cholesterol: 252 mg/dL — ABNORMAL HIGH (ref 0–200)
HDL: 79.4 mg/dL (ref >39.00)
LDL Cholesterol: 134 mg/dL — ABNORMAL HIGH (ref 0–99)
NonHDL: 172.13
Total CHOL/HDL Ratio: 3
Triglycerides: 191 mg/dL — ABNORMAL HIGH (ref 0.0–149.0)
VLDL: 38.2 mg/dL (ref 0.0–40.0)

## 2024-03-13 NOTE — Assessment & Plan Note (Signed)
 Urostomy tube intact. Following with Urology through [facility], reviewed office notes from June 2024 through Care Everywhere.

## 2024-03-13 NOTE — Assessment & Plan Note (Signed)
 Controlled.  Continue amlodipine 10 mg daily and valsartan 160 mg daily. CMP pending.

## 2024-03-13 NOTE — Assessment & Plan Note (Signed)
 Following with nephrology. Office notes and labs reviewed from January 2025 through Care Everywhere.  Continue amlodipine 10 mg daily and valsartan 160 mg daily.

## 2024-03-13 NOTE — Assessment & Plan Note (Signed)
 Immunizations UTD. Mammogram and bone density scan declined by patient.  Discussed the importance of a healthy diet and regular exercise in order for weight loss, and to reduce the risk of further co-morbidity.  Exam stable. Labs pending.  Follow up in 1 year for repeat physical.

## 2024-03-13 NOTE — Progress Notes (Signed)
 Subjective:    Patient ID: Sara Aguirre, female    DOB: January 18, 1941, 83 y.o.   MRN: [id number]  HPI  Sara Aguirre is a very pleasant 83 y.o. female who presents today for complete physical and follow up of chronic conditions.  Immunizations:  -Shingles: Completed Shingrix series -Pneumonia: Completed Prevnar 13 in 2020, pneumovax 23 in 2019  Diet: Fair diet.  Exercise: No regular exercise.   Eye exam: Completes annually  Dental exam: Completed years ago.   Mammogram: Completed years ago. Declines today. Bone Density Scan: Completed years ago. Declines today.   BP Readings from Last 3 Encounters:  03/13/24 138/66  03/27/23 (!) 148/74  03/13/23 (!) 168/86       Review of Systems  Constitutional:  Negative for unexpected weight change.  HENT:  Negative for rhinorrhea.   Respiratory:  Negative for cough and shortness of breath.   Cardiovascular:  Negative for chest pain.  Gastrointestinal:  Negative for constipation and diarrhea.  Genitourinary:  Negative for difficulty urinating.  Musculoskeletal:  Negative for arthralgias and myalgias.  Skin:  Negative for rash.  Allergic/Immunologic: Negative for environmental allergies.  Neurological:  Negative for dizziness and headaches.  Psychiatric/Behavioral:  The patient is not nervous/anxious.          Past Medical History:  Diagnosis Date   Cancer [facility])    bladder, s/p cystectomy with ileal conduit   Confusion 03/13/2023   Gastric ulcer 12/29/2011   IMO SNOMED Dx Update Oct 2024     Hematuria 09/27/2021   History of bladder cancer    History of tobacco use    Hyperlipidemia    Hypertension    Incarcerated ventral hernia, s/p lap repair 12/05/2011   Parastomal hernia of ileal conduit, s/p lap repair [id number] 12/04/2011   Thyroid nodule 08/20/2013   Ulcer, gastric, acute or chronic     Social History   Socioeconomic History   Marital status: Widowed    Spouse name: Not on file   Number of children: 2    Years of education: Not on file   Highest education level: Some college, no degree  Occupational History   Occupation: retired  Tobacco Use   Smoking status: Former    Current packs/day: 0.00    Types: Cigarettes    Quit date: 01/02/1993    Years since quitting: 31.2   Smokeless tobacco: Never  Vaping Use   Vaping status: Never Used  Substance and Sexual Activity   Alcohol use: No   Drug use: No   Sexual activity: Not on file  Other Topics Concern   Not on file  Social History Narrative   Marital status: widowed since 1992; not dating; not interested      Children:  Twin boys (45); 2 grandchildren      Lives: alone with dog, 5 chickens, 2 rabbits      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  Gardening      ADLs: drives; independent with ADLs; has garden      Advanced Directives: YES: full code but no prolonged measures; HCPOA: Conception Chancy or other son Rowe Clack; copy on chart 04/2017.   Previously lived in [place]    Social Drivers of Health   Financial Resource Strain: Low Risk  (03/26/2023)  Overall Financial Resource Strain (CARDIA)    Difficulty of Paying Living Expenses: Not hard at all  Food Insecurity: No Food Insecurity (03/26/2023)   Hunger Vital Sign    Worried About Running Out of Food in the Last Year: Never true    Ran Out of Food in the Last Year: Never true  Transportation Needs: No Transportation Needs (03/26/2023)   PRAPARE - Administrator, Civil Service (Medical): No    Lack of Transportation (Non-Medical): No  Physical Activity: Insufficiently Active (03/26/2023)   Exercise Vital Sign    Days of Exercise per Week: 3 days    Minutes of Exercise per Session: 30 min  Stress: No Stress Concern Present (03/26/2023)   [employer] of Occupational Health - Occupational Stress Questionnaire     Feeling of Stress : Not at all  Social Connections: Moderately Isolated (03/26/2023)   Social Connection and Isolation Panel [NHANES]    Frequency of Communication with Friends and Family: More than three times a week    Frequency of Social Gatherings with Friends and Family: More than three times a week    Attends Religious Services: More than 4 times per year    Active Member of [employer] or Organizations: No    Attends Banker Meetings: Never    Marital Status: Widowed  Intimate Partner Violence: Not At Risk (03/26/2023)   Humiliation, Afraid, Rape, and Kick questionnaire    Fear of Current or Ex-Partner: No    Emotionally Abused: No    Physically Abused: No    Sexually Abused: No    Past Surgical History:  Procedure Laterality Date   BLADDER REMOVAL  10/1993   Dr. Isabel Caprice   CAROTID PTA/STENT INTERVENTION Right 11/08/2021   Procedure: CAROTID PTA/STENT INTERVENTION;  Surgeon: Renford Dills, MD;  Location: [facility] INVASIVE CV LAB;  Service: Cardiovascular;  Laterality: Right;   HERNIA REPAIR  12/05/11   parastomal   LOOP RECORDER INSERTION N/A 04/23/2020   Procedure: LOOP RECORDER INSERTION;  Surgeon: Duke Salvia, MD;  [facility] INVASIVE CV LAB;  Service: Cardiovascular;  Laterality: N/A;   PARASTOMAL HERNIA REPAIR  12/05/2011   Procedure: HERNIA REPAIR PARASTOMAL;  Surgeon: Ardeth Sportsman, MD;  Location: [facility] ORS;  Service: General;;  Laprascopic lysis of adhestons with reduction and repair with biological mesh for incarcerated parastomal and ventral long incisional hernia.   REVISION UROSTOMY CUTANEOUS     VENTRAL HERNIA REPAIR  12/05/2011   Procedure: HERNIA REPAIR VENTRAL ADULT;  Surgeon: Ardeth Sportsman, MD;  Location: [facility] ORS;  Service: General;;    Family History  Problem Relation Age of Onset   Heart disease Mother 70       CHF   Alcohol abuse Father    Stroke Father     Allergies  Allergen Reactions   Imodium [Loperamide]     Itchy all over    Loperamide Hcl Rash   Other     Also allergic to a "cough medicine" but patient does not remember the name    Current Outpatient Medications on File Prior to Visit  Medication Sig Dispense Refill   clopidogrel (PLAVIX) 75 MG tablet TAKE 1 TABLET (75 MG TOTAL) BY MOUTH DAILY. FOR STROKE 90 tablet 0   Multiple Vitamin (MULTIVITAMIN WITH MINERALS) TABS tablet Take 1 tablet by mouth daily.     rosuvastatin (CRESTOR) 5 MG tablet TAKE 1 TABLET (5 MG TOTAL) BY MOUTH DAILY FOR CHOLESTEROL 90 tablet 0   UNABLE  TO FIND HOLISTER UROSTOMY POUCHES ITEM 8478 100 Device 3   valsartan (DIOVAN) 160 MG tablet TAKE 1 TABLET (160 MG TOTAL) BY MOUTH DAILY. FOR BLOOD PRESSURE. 90 tablet 0   vitamin B-12 (CYANOCOBALAMIN) 50 MCG tablet Take 50 mcg by mouth daily.     amLODipine (NORVASC) 10 MG tablet TAKE 1 TABLET BY MOUTH EVERY DAY FOR BLOOD PRESSURE (Patient not taking: Reported on 03/13/2024) 90 tablet 0   aspirin EC 81 MG tablet Take 81 mg by mouth daily. Swallow whole. (Patient not taking: Reported on 03/13/2023)     furosemide (LASIX) 20 MG tablet Take 20 mg by mouth every Monday, Wednesday, and Friday. (Patient not taking: Reported on 03/13/2024)     No current facility-administered medications on file prior to visit.    BP 138/66   Pulse 86   Temp (!) 97.2 F (36.2 C) (Temporal)   Ht 5\' 8"  (1.727 m)   Wt 166 lb (75.3 kg)   SpO2 98%   BMI 25.24 kg/m  Objective:   Physical Exam HENT:     Right Ear: Tympanic membrane and ear canal normal.     Left Ear: Tympanic membrane and ear canal normal.  Eyes:     Pupils: Pupils are equal, round, and reactive to light.  Cardiovascular:     Rate and Rhythm: Normal rate and regular rhythm.  Pulmonary:     Effort: Pulmonary effort is normal.     Breath sounds: Normal breath sounds.  Abdominal:     General: Bowel sounds are normal.     Palpations: Abdomen is soft.     Tenderness: There is no abdominal tenderness.  Musculoskeletal:        General: Normal  range of motion.     Cervical back: Neck supple.  Skin:    General: Skin is warm and dry.  Neurological:     Mental Status: She is alert and oriented to person, place, and time.     Cranial Nerves: No cranial nerve deficit.     Deep Tendon Reflexes:     Reflex Scores:      Patellar reflexes are 2+ on the right side and 2+ on the left side. Psychiatric:        Mood and Affect: Mood normal.           Assessment & Plan:  Preventative health care Assessment & Plan: Immunizations UTD. Mammogram and bone density scan declined by patient.  Discussed the importance of a healthy diet and regular exercise in order for weight loss, and to reduce the risk of further co-morbidity.  Exam stable. Labs pending.  Follow up in 1 year for repeat physical.    Primary hypertension Assessment & Plan: Controlled.  Continue amlodipine 10 mg daily and valsartan 160 mg daily. CMP pending.   Prediabetes -     Hemoglobin A1c  Pure hypercholesterolemia Assessment & Plan: Repeat lipid panel pending. Continue rosuvastatin 5 mg daily.  Orders: -     Lipid panel  Stage 3b chronic kidney disease (HCC) Assessment & Plan: Following with nephrology. Office notes and labs reviewed from January 2025 through Care Everywhere.  Continue amlodipine 10 mg daily and valsartan 160 mg daily.   History of bladder cancer, s/p cystectomy with ileal conduit Assessment & Plan: Urostomy tube intact. Following with Urology through [facility], reviewed office notes from June 2024 through Care Everywhere.     History of CVA s/p R ICA stent(cerebrovascular accident) Assessment & Plan: No new symptoms.  Continue  BP control, lipid control, aspirin 81 mg daily, clopidogrel 75 mg daily.    Left renal mass Assessment & Plan: Following with Urology through [facility]. Reviewed office notes and MRI from June 2024 through care everywhere         Doreene Nest, NP

## 2024-03-13 NOTE — Assessment & Plan Note (Signed)
 Following with Urology through [facility]. Reviewed office notes and MRI from June 2024 through care everywhere

## 2024-03-13 NOTE — Assessment & Plan Note (Signed)
 No new symptoms.  Continue BP control, lipid control, aspirin 81 mg daily, clopidogrel 75 mg daily.

## 2024-03-13 NOTE — Assessment & Plan Note (Signed)
 Repeat lipid panel pending. Continue rosuvastatin 5 mg daily.

## 2024-03-14 LAB — HEMOGLOBIN A1C: Hgb A1c MFr Bld: 5.7 % (ref 4.6–6.5)

## 2024-03-26 ENCOUNTER — Other Ambulatory Visit: Payer: Self-pay | Admitting: Primary Care

## 2024-03-26 ENCOUNTER — Ambulatory Visit (INDEPENDENT_AMBULATORY_CARE_PROVIDER_SITE_OTHER): Payer: PPO

## 2024-03-26 VITALS — Ht 68.0 in | Wt 166.0 lb

## 2024-03-26 DIAGNOSIS — Z0000 Encounter for general adult medical examination without abnormal findings: Secondary | ICD-10-CM | POA: Diagnosis not present

## 2024-03-26 DIAGNOSIS — I639 Cerebral infarction, unspecified: Secondary | ICD-10-CM

## 2024-03-26 DIAGNOSIS — I10 Essential (primary) hypertension: Secondary | ICD-10-CM

## 2024-03-26 NOTE — Progress Notes (Signed)
 Subjective:   Sara Aguirre is a 83 y.o. who presents for a Medicare Wellness preventive visit.  Visit Complete: Virtual I connected with  Turner Daniels on 03/26/24 by a audio enabled telemedicine application and verified that I am speaking with the correct person using two identifiers.  Patient Location: Home  Provider Location: Home Office  I discussed the limitations of evaluation and management by telemedicine. The patient expressed understanding and agreed to proceed.  Vital Signs: Because this visit was a virtual/telehealth visit, some criteria may be missing or patient reported. Any vitals not documented were not able to be obtained and vitals that have been documented are patient reported.  VideoDeclined- This patient declined Librarian, academic. Therefore the visit was completed with audio only.  Persons Participating in Visit: Patient.  AWV Questionnaire: No: Patient Medicare AWV questionnaire was not completed prior to this visit.  Cardiac Risk Factors include: advanced age (>74men, >93 women);dyslipidemia;hypertension     Objective:    Today's Vitals   03/26/24 1427  Weight: 166 lb (75.3 kg)  Height: 5\' 8"  (1.727 m)   Body mass index is 25.24 kg/m.     03/26/2024    2:45 PM 03/26/2023    2:16 PM 11/02/2022    6:34 PM 05/27/2022    9:57 PM 05/27/2022    5:22 PM 03/22/2022    2:10 PM 11/08/2021    4:58 PM  Advanced Directives  Does Patient Have a Medical Advance Directive? Yes Yes No Yes No Yes Yes  Type of Estate agent of [place];Living will Living will;Healthcare Power of [employer] Power of [place];Living will  Healthcare Power of [place];Living will Healthcare Power of Attorney  Does patient want to make changes to medical advance directive?  No - Patient declined  No - Patient declined   Yes (Inpatient - patient defers changing a medical advance directive at this time - Information given)  Copy of  Healthcare Power of Attorney in Chart? Yes - validated most recent copy scanned in chart (See row information) No - copy requested  No - copy requested  Yes - validated most recent copy scanned in chart (See row information) No - copy requested  Would patient like information on creating a medical advance directive?    No - Patient declined No - Patient declined      Current Medications (verified) Outpatient Encounter Medications as of 03/26/2024  Medication Sig   amLODipine (NORVASC) 10 MG tablet TAKE 1 TABLET BY MOUTH EVERY DAY FOR BLOOD PRESSURE   aspirin EC 81 MG tablet Take 81 mg by mouth daily. Swallow whole.   clopidogrel (PLAVIX) 75 MG tablet TAKE 1 TABLET (75 MG TOTAL) BY MOUTH DAILY. FOR STROKE   furosemide (LASIX) 20 MG tablet Take 20 mg by mouth every Monday, Wednesday, and Friday.   Multiple Vitamin (MULTIVITAMIN WITH MINERALS) TABS tablet Take 1 tablet by mouth daily.   rosuvastatin (CRESTOR) 5 MG tablet TAKE 1 TABLET (5 MG TOTAL) BY MOUTH DAILY FOR CHOLESTEROL   UNABLE TO FIND HOLISTER UROSTOMY POUCHES ITEM 8478   valsartan (DIOVAN) 160 MG tablet TAKE 1 TABLET (160 MG TOTAL) BY MOUTH DAILY. FOR BLOOD PRESSURE.   vitamin B-12 (CYANOCOBALAMIN) 50 MCG tablet Take 50 mcg by mouth daily.   No facility-administered encounter medications on file as of 03/26/2024.    Allergies (verified) Imodium [loperamide], Loperamide hcl, and Other   History: Past Medical History:  Diagnosis Date   Cancer (HCC)  bladder, s/p cystectomy with ileal conduit   Confusion 03/13/2023   Gastric ulcer 12/29/2011   IMO SNOMED Dx Update Oct 2024     Hematuria 09/27/2021   History of bladder cancer    History of tobacco use    Hyperlipidemia    Hypertension    Incarcerated ventral hernia, s/p lap repair 12/05/2011   Parastomal hernia of ileal conduit, s/p lap repair [id number] 12/04/2011   Thyroid nodule 08/20/2013   Ulcer, gastric, acute or chronic    Past Surgical History:  Procedure Laterality  Date   BLADDER REMOVAL  10/1993   Dr. Isabel Caprice   CAROTID PTA/STENT INTERVENTION Right 11/08/2021   Procedure: CAROTID PTA/STENT INTERVENTION;  Surgeon: Renford Dills, MD;  Location: [facility] INVASIVE CV LAB;  Service: Cardiovascular;  Laterality: Right;   HERNIA REPAIR  12/05/11   parastomal   LOOP RECORDER INSERTION N/A 04/23/2020   Procedure: LOOP RECORDER INSERTION;  Surgeon: Duke Salvia, MD;  Location: [facility] INVASIVE CV LAB;  Service: Cardiovascular;  Laterality: N/A;   PARASTOMAL HERNIA REPAIR  12/05/2011   Procedure: HERNIA REPAIR PARASTOMAL;  Surgeon: Ardeth Sportsman, MD;  Location: [facility] ORS;  Service: General;;  Laprascopic lysis of adhestons with reduction and repair with biological mesh for incarcerated parastomal and ventral long incisional hernia.   REVISION UROSTOMY CUTANEOUS     VENTRAL HERNIA REPAIR  12/05/2011   Procedure: HERNIA REPAIR VENTRAL ADULT;  Surgeon: Ardeth Sportsman, MD;  Location: [facility] ORS;  Service: General;;   Family History  Problem Relation Age of Onset   Heart disease Mother 56       CHF   Alcohol abuse Father    Stroke Father    Social History   Socioeconomic History   Marital status: Widowed    Spouse name: Not on file   Number of children: 2   Years of education: Not on file   Highest education level: Some college, no degree  Occupational History   Occupation: retired  Tobacco Use   Smoking status: Former    Current packs/day: 0.00    Types: Cigarettes    Quit date: 01/02/1993    Years since quitting: 31.2   Smokeless tobacco: Never  Vaping Use   Vaping status: Never Used  Substance and Sexual Activity   Alcohol use: No   Drug use: No   Sexual activity: Not on file  Other Topics Concern   Not on file  Social History Narrative   Marital status: widowed since 1992; not dating; not interested      Children:  Twin boys (45); 2 grandchildren      Lives: alone with dog, 5 chickens, 2 rabbits      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  Gardening      ADLs: drives; independent with ADLs; has garden      Advanced Directives: YES: full code but no prolonged measures; HCPOA: Conception Chancy or other son Rowe Clack; copy on chart 04/2017.   Previously lived in [place]    Social Drivers of Health   Financial Resource Strain: Low Risk  (03/26/2024)   Overall Financial Resource Strain (CARDIA)    Difficulty of Paying Living Expenses: Not hard at all  Food Insecurity: No Food Insecurity (03/26/2024)   Hunger Vital Sign  Worried About Programme researcher, broadcasting/film/video in the Last Year: Never true    Ran Out of Food in the Last Year: Never true  Transportation Needs: No Transportation Needs (03/26/2024)   PRAPARE - Administrator, Civil Service (Medical): No    Lack of Transportation (Non-Medical): No  Physical Activity: Insufficiently Active (03/26/2024)   Exercise Vital Sign    Days of Exercise per Week: 3 days    Minutes of Exercise per Session: 30 min  Stress: No Stress Concern Present (03/26/2024)   [employer] of Occupational Health - Occupational Stress Questionnaire    Feeling of Stress : Not at all  Social Connections: Moderately Isolated (03/26/2024)   Social Connection and Isolation Panel [NHANES]    Frequency of Communication with Friends and Family: More than three times a week    Frequency of Social Gatherings with Friends and Family: More than three times a week    Attends Religious Services: More than 4 times per year    Active Member of [employer] or Organizations: No    Attends Banker Meetings: Never    Marital Status: Widowed    Tobacco Counseling Counseling given: Not Answered    Clinical Intake:  Pre-visit preparation completed: Yes  Pain : No/denies pain     BMI - recorded: 25.24 Nutritional Status: BMI 25 -29 Overweight Nutritional  Risks: None Diabetes: No  Lab Results  Component Value Date   HGBA1C 5.7 03/13/2024   HGBA1C 5.6 10/09/2021   HGBA1C 5.6 08/11/2021     How often do you need to have someone help you when you read instructions, pamphlets, or other written materials from your doctor or pharmacy?: 1 - Never  Interpreter Needed?: No  Comments: lives alone Information entered by :: B.Zena Vitelli,LPN   Activities of Daily Living     03/26/2024    2:46 PM  In your present state of health, do you have any difficulty performing the following activities:  Hearing? 0  Vision? 0  Difficulty concentrating or making decisions? 0  Walking or climbing stairs? 0  Dressing or bathing? 0  Doing errands, shopping? 0  Preparing Food and eating ? N  Using the Toilet? N  In the past six months, have you accidently leaked urine? N  Do you have problems with loss of bowel control? N  Managing your Medications? N  Managing your Finances? N  Housekeeping or managing your Housekeeping? N    Patient Care Team: Doreene Nest, NP as PCP - General (Internal Medicine) Mady Haagensen, MD (Nephrology) [facility]  Indicate any recent Medical Services you may have received from other than [facility] providers in the past year (date may be approximate).     Assessment:   This is a routine wellness examination for Maritta.  Hearing/Vision screen Hearing Screening - Comments:: Pt says her hearing is not good sometimes with low tones  Vision Screening - Comments:: Pt says she wears glasses for near distance;20/20 distance vision [facility]    Dolores Lory Addressed             This Visit's Progress    DIET - REDUCE PORTION SIZE       03/26/24-Keep weight down, love to get off high blood pressure medicine Try to exercise more.      Prevent falls   On track    03/26/24     Remain active and independent   On track    03/26/24  Depression Screen     03/26/2024    2:39 PM 03/13/2024    11:38 AM 03/26/2023    2:03 PM 03/22/2022    2:01 PM 11/04/2020    1:34 PM 08/26/2020    9:20 AM 07/12/2020    9:33 AM  PHQ 2/9 Scores  PHQ - 2 Score 0 1 0 0 0 0 0    Fall Risk     03/26/2024    2:32 PM 03/13/2024   11:38 AM 03/27/2023    2:54 PM 03/26/2023    2:02 PM 03/22/2022    2:11 PM  Fall Risk   Falls in the past year? 0 1 0 0 0  Number falls in past yr: 0 0 0 0 0  Injury with Fall? 0 0 0 0 0  Risk for fall due to : No Fall Risks Impaired balance/gait No Fall Risks No Fall Risks Impaired balance/gait  Follow up  Falls evaluation completed Falls evaluation completed Falls prevention discussed;Education provided;Falls evaluation completed Falls prevention discussed    MEDICARE RISK AT HOME:  Medicare Risk at Home Any stairs in or around the home?: No If so, are there any without handrails?: No Home free of loose throw rugs in walkways, pet beds, electrical cords, etc?: Yes Adequate lighting in your home to reduce risk of falls?: Yes Life alert?: No Use of a cane, Garciamartinez or w/c?: Yes Grab bars in the bathroom?: Yes Shower chair or bench in shower?: No Elevated toilet seat or a handicapped toilet?: No  TIMED UP AND GO:  Was the test performed?  No  Cognitive Function: 6CIT completed        03/26/2024    2:48 PM 03/26/2023    2:19 PM 03/22/2022    2:14 PM 03/26/2019   10:07 AM 01/24/2018    9:47 AM  6CIT Screen  What Year? 0 points 0 points 0 points 0 points 0 points  What month? 0 points 0 points 0 points 0 points 0 points  What time? 0 points 0 points 0 points 0 points 0 points  Count back from 20 0 points 0 points 0 points 0 points 0 points  Months in reverse 0 points 0 points 0 points 0 points 0 points  Repeat phrase 8 points 2 points 0 points 0 points 2 points  Total Score 8 points 2 points 0 points 0 points 2 points    Immunizations Immunization History  Administered Date(s) Administered   Influenza, High Dose Seasonal PF 10/01/2017, 08/21/2018, 08/08/2019    Influenza,inj,Quad PF,6+ Mos 01/17/2017   Influenza-Unspecified 10/07/2015, 08/21/2018, 08/08/2019, 08/22/2020   PFIZER(Purple Top)SARS-COV-2 Vaccination 02/27/2020, 03/19/2020   Pneumococcal Conjugate-13 01/17/2017, 08/08/2019   Pneumococcal Polysaccharide-23 04/19/2018   Zoster Recombinant(Shingrix) 09/21/2019, 11/27/2019    Screening Tests Health Maintenance  Topic Date Due   DEXA SCAN  Never done   INFLUENZA VACCINE  07/18/2024   Medicare Annual Wellness (AWV)  03/26/2025   Pneumonia Vaccine 75+ Years old  Completed   Zoster Vaccines- Shingrix  Completed   HPV VACCINES  Aged Out   DTaP/Tdap/Td  Discontinued   COVID-19 Vaccine  Discontinued    Health Maintenance  Health Maintenance Due  Topic Date Due   DEXA SCAN  Never done   Health Maintenance Items Addressed: None needed  Additional Screening:  Vision Screening: Recommended annual ophthalmology exams for early detection of glaucoma and other disorders of the eye.  Dental Screening: Recommended annual dental exams for proper oral hygiene  Community Resource Referral /  Chronic Care Management: CRR required this visit?  No   CCM required this visit?  No     Plan:     I have personally reviewed and noted the following in the patient's chart:   Medical and social history Use of alcohol, tobacco or illicit drugs  Current medications and supplements including opioid prescriptions. Patient is not currently taking opioid prescriptions. Functional ability and status Nutritional status Physical activity Advanced directives List of other physicians Hospitalizations, surgeries, and ER visits in previous 12 months Vitals Screenings to include cognitive, depression, and falls Referrals and appointments  In addition, I have reviewed and discussed with patient certain preventive protocols, quality metrics, and best practice recommendations. A written personalized care plan for preventive services as well as general  preventive health recommendations were provided to patient.     Sue Lush, LPN   07/24/5783   After Visit Summary: (MyChart) Due to this being a telephonic visit, the after visit summary with patients personalized plan was offered to patient via MyChart   Notes: Nothing significant to report at this time.

## 2024-03-26 NOTE — Patient Instructions (Signed)
 [name] , Thank you for taking time to come for your Medicare Wellness Visit. I appreciate your ongoing commitment to your health goals. Please review the following plan we discussed and let me know if I can assist you in the future.   Referrals/Orders/Follow-Ups/Clinician Recommendations: none  This is a list of the screening recommended for you and due dates:  Health Maintenance  Topic Date Due   DEXA scan (bone density measurement)  Never done   Flu Shot  07/18/2024   Medicare Annual Wellness Visit  03/26/2025   Pneumonia Vaccine  Completed   Zoster (Shingles) Vaccine  Completed   HPV Vaccine  Aged Out   DTaP/Tdap/Td vaccine  Discontinued   COVID-19 Vaccine  Discontinued    Advanced directives: (In Chart) A copy of your advanced directives are scanned into your chart should your provider ever need it.  Next Medicare Annual Wellness Visit scheduled for next year: Yes 03/27/2025 @ 2:20pm televisit

## 2024-04-01 ENCOUNTER — Other Ambulatory Visit: Payer: Self-pay | Admitting: Primary Care

## 2024-04-01 DIAGNOSIS — I10 Essential (primary) hypertension: Secondary | ICD-10-CM

## 2024-04-01 NOTE — Telephone Encounter (Signed)
 Prescription completed and placed in Sara Aguirre's inbox.

## 2024-06-05 ENCOUNTER — Other Ambulatory Visit: Payer: Self-pay | Admitting: Primary Care

## 2024-06-05 DIAGNOSIS — E782 Mixed hyperlipidemia: Secondary | ICD-10-CM

## 2024-06-26 DIAGNOSIS — I10 Essential (primary) hypertension: Secondary | ICD-10-CM | POA: Diagnosis not present

## 2024-06-26 DIAGNOSIS — R319 Hematuria, unspecified: Secondary | ICD-10-CM | POA: Diagnosis not present

## 2024-06-26 DIAGNOSIS — I129 Hypertensive chronic kidney disease with stage 1 through stage 4 chronic kidney disease, or unspecified chronic kidney disease: Secondary | ICD-10-CM | POA: Diagnosis not present

## 2024-06-26 DIAGNOSIS — R809 Proteinuria, unspecified: Secondary | ICD-10-CM | POA: Diagnosis not present

## 2024-06-26 DIAGNOSIS — N2889 Other specified disorders of kidney and ureter: Secondary | ICD-10-CM | POA: Diagnosis not present

## 2024-06-26 DIAGNOSIS — N1832 Chronic kidney disease, stage 3b: Secondary | ICD-10-CM | POA: Diagnosis not present

## 2024-06-26 DIAGNOSIS — N184 Chronic kidney disease, stage 4 (severe): Secondary | ICD-10-CM | POA: Diagnosis not present

## 2024-07-02 DIAGNOSIS — I129 Hypertensive chronic kidney disease with stage 1 through stage 4 chronic kidney disease, or unspecified chronic kidney disease: Secondary | ICD-10-CM | POA: Diagnosis not present

## 2024-07-02 DIAGNOSIS — R319 Hematuria, unspecified: Secondary | ICD-10-CM | POA: Diagnosis not present

## 2024-07-02 DIAGNOSIS — N184 Chronic kidney disease, stage 4 (severe): Secondary | ICD-10-CM | POA: Diagnosis not present

## 2024-07-02 DIAGNOSIS — R809 Proteinuria, unspecified: Secondary | ICD-10-CM | POA: Diagnosis not present

## 2024-07-02 NOTE — Progress Notes (Signed)
 [facility] Follow Up Visit   Patient Name: Sara Aguirre, female   Patient DOB: 1941/01/23 Date of Service: 07/02/2024  Patient MRN: [id number] Provider Creating Note: Woodward Brought, MD  [phone] Primary Care Physician: Gretta Crank, AGNP-C   [address] [place] [address] Additional Physicians/ Providers:   Impression/Recommendations   SaraSara Aguirre is a 83 y.o. female with hypertension, CVA/TIA, history of bladder cancer, hyperlipidemia who presents for follow up for chronic kidney disease stage IIIB. Creatinine 1.88, GFR of 26.  KFRE: 26.22% in 2 years and 62.86% in 5 years.   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 Due to risks of urinary tract infections, do not recommend restart at this time.  - not currently on a mineralocorticoid receptor antagonist - Avoid NSAIDs - continue renal vitamin  Hypertension with chronic kidney disease: 162/73. Patient claims home readings are at goal.  -  Current regimen of furosemide, valsartan , and amlodipine .   - Continue home blood pressure monitoring.   Renal Mass: left renal masses. MRI consistent with renal cell carcinoma.  - follows with [facility], urology.    Patient Active Problem List  Diagnosis  . Chronic kidney disease, Stage IV (severe) (HCC)  . 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 Proteinuria  . Hematuria  . Renal mass    Orders Placed This Encounter  . PTH, Intact  . Renal Function Panel  . CBC and Differential  . Urinalysis, Complete w/reflex to Culture  . Protein, Total, Random Urine w/Creatinine (Protein/Creat Ratio)       Return in about 6 months (around 01/02/2025).  Chief Complaint   Chief Complaint  Patient presents with  . Follow-up    History of Present Illness  Sara Aguirre presents for follow up. Patient presents  by herself. Patient states she is doing well and has no complaints. Patient denies any changes to her health. Patient continues to take all her medications as prescribed. Reports no changes to her medications. Patient denies any recent hospitalizations. Patient denies use of nonsteroidal anti-inflammatory agents.   Patient denies any recent urinary tract infections. On last visit, patient was asked to discontinue dapagliflozin due to frequent urinary tract infections.   Patient states her home blood pressure readings are systolic in the 120s and diastolic in the 60-70s.   Patient is reading about nutrition. She is continuing to keep a plant based diet.   Medications   Current Outpatient Medications:  .  amLODIPine  (NORVASC ) 10 MG tablet, Take 10 mg by mouth 1 (one) time each day, Disp: , Rfl:  .  clopidogrel  (PLAVIX ) 75 MG tablet, Take 75 mg by mouth once daily, Disp: , Rfl:  .  Coenzyme Q10 200 MG tablet, Take 2 tablets by mouth 1 (one) time each day, Disp: , Rfl:  .  Multiple Vitamins-Minerals (Multivitamin Gummies Womens) chewable tablet, Chew 2 tablets 1 (one) time each day, Disp: , Rfl:  .  rosuvastatin  (CRESTOR ) 5 MG tablet, Take 1 tablet (5 mg total) by mouth daily. For cholesterol., Disp: , Rfl:  .  valsartan  (DIOVAN ) 160 MG tablet, Take 160 mg by mouth 1 (one) time each day, Disp: , Rfl:    Allergies Loperamide  History Past Medical History:  Diagnosis Date  . Bladder cancer (HCC)   . Chronic kidney disease stage 3B (HCC) 02/09/2022  . Chronic kidney  disease, Stage IV (severe) (HCC) 09/27/2021  . Hematuria 09/27/2021  . Hyperlipidemia   . 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 Ischemic stroke (HCC)   . Peptic ulcer   . Proteinuria 09/27/2021  . Renal mass 06/28/2023    Past Surgical History:  Procedure Laterality Date  . CYSTECTOMY     with ileal conduit  . HERNIA REPAIR   12/05/2011  . PARASTOMAL HERNIA REPAIR  12/05/2011  . VENTRAL HERNIA REPAIR  12/05/2011   with incarceration   Family History  Problem Relation Age of Onset  . Heart disease Mother   . Stroke Father   . History of Dialysis Status Brother    Social History   Tobacco Use  . Smoking status: Former    Current packs/day: 0.00    Types: Cigarettes    Quit date: 01/02/1993    Years since quitting: 31.5  . Smokeless tobacco: Never  Substance Use Topics  . Alcohol use: Yes    Comment: occaisional     Physical Exam  Vitals BP 138/74 (BP Location: Right upper arm, Patient Position: Standing)   Pulse 68   Temp 97.9 F   Wt 164 lb 12.8 oz (74.8 kg)   SpO2 92%   BMI 25.06 kg/m   Vitals reviewed. Constitutional: She is oriented to person, place, and time.  HEENT:  Right Ear: Hearing normal.  Left Ear: Hearing normal.  Nose: Nose normal. Mouth/Throat: Oropharynx is clear and moist.  Eyes: Conjunctivae and EOM are normal. Pupils are equal, round, and reactive to light.  Cardiovascular:  Normal rate, regular rhythm and intact distal pulses.           Pulmonary/Chest: Effort normal and breath sounds normal.  Abdominal: Soft. Bowel sounds are normal.  Neurological: She is alert and oriented to person, place, and time. She has normal reflexes.  Skin: Skin is warm and dry.  Psychiatric: She has a normal mood and affect. Her behavior is normal. Judgment normal.     Laboratory Studies  Chemistry  Lab Units 06/26/24 1004 01/03/24 1222 06/28/23 1132 05/01/23 0911 11/16/22 0843  SODIUM mmol/L 139 137 138  --  141  POTASSIUM mmol/L 4.8 4.7 4.2  --  4.4  CHLORIDE mmol/L 102 99 101  --  102  CO2 mmol/L 28 28 27   --  29  CALCIUM  mg/dL 89.9 9.9 9.4  --  9.5  PHOSPHORUS mg/dL 4.2 4.8* 4.1  --  3.9  PTH pg/mL 42 32 38  --  47  GLUCOSE mg/dL 892* 91 82  --  898*  ALBUMIN g/dL 4.4 4.3 4.0  --  4.2  BUN mg/dL 35* 24 25  --  20  CREATININE mg/dL 8.11* 8.21* 8.06* 1.8* 1.51*         No lab exists for component: IRON SATURATION, TRANSSATPER  CBC  Lab Units 06/26/24 1004 01/03/24 1222 06/28/23 1132 11/16/22 0843  WBC AUTO Thousand/uL 8.3 11.6* 9.5 8.6  HEMOGLOBIN g/dL 86.0 85.4 87.4 86.0  HEMOGLOBIN URINE  2+* 2+* 1+* NEGATIVE  HEMATOCRIT % 43.8 44.0 39.1 44.0  MCV fL 95.8 91.1 91.8 81.0  PLATELETS AUTO Thousand/uL 245 294 310 384    Urine  Lab Units 06/26/24 1004 01/03/24 1222 06/28/23 1132 11/16/22 0843  COLOR U  YELLOW YELLOW YELLOW YELLOW  KETONES U MG/DL  NEGATIVE NEGATIVE NEGATIVE NEGATIVE  PROT/CREAT RATIO UR mg/g creat 6.294*  6,294* 2.380*  2,380* 1.233*  1,233* 2.353*  2,353*        No lab exists for component: CYCLOSPORITR     Woodward Brought, MD  [facility] [place], [place]

## 2024-10-16 ENCOUNTER — Emergency Department

## 2024-10-16 ENCOUNTER — Emergency Department
Admission: EM | Admit: 2024-10-16 | Discharge: 2024-10-16 | Disposition: A | Discharge status: Home / Self Care | Attending: Emergency Medicine | Admitting: Emergency Medicine

## 2024-10-16 ENCOUNTER — Other Ambulatory Visit: Payer: Self-pay

## 2024-10-16 ENCOUNTER — Encounter: Payer: Self-pay | Admitting: Emergency Medicine

## 2024-10-16 DIAGNOSIS — I129 Hypertensive chronic kidney disease with stage 1 through stage 4 chronic kidney disease, or unspecified chronic kidney disease: Secondary | ICD-10-CM | POA: Diagnosis not present

## 2024-10-16 DIAGNOSIS — Z8673 Personal history of transient ischemic attack (TIA), and cerebral infarction without residual deficits: Secondary | ICD-10-CM | POA: Diagnosis not present

## 2024-10-16 DIAGNOSIS — Z8551 Personal history of malignant neoplasm of bladder: Secondary | ICD-10-CM | POA: Insufficient documentation

## 2024-10-16 DIAGNOSIS — R1111 Vomiting without nausea: Secondary | ICD-10-CM | POA: Diagnosis not present

## 2024-10-16 DIAGNOSIS — N1832 Chronic kidney disease, stage 3b: Secondary | ICD-10-CM | POA: Insufficient documentation

## 2024-10-16 DIAGNOSIS — R55 Syncope and collapse: Secondary | ICD-10-CM | POA: Diagnosis not present

## 2024-10-16 DIAGNOSIS — I959 Hypotension, unspecified: Secondary | ICD-10-CM | POA: Diagnosis not present

## 2024-10-16 DIAGNOSIS — R0902 Hypoxemia: Secondary | ICD-10-CM | POA: Diagnosis not present

## 2024-10-16 LAB — URINALYSIS, ROUTINE W REFLEX MICROSCOPIC
Bilirubin Urine: NEGATIVE
Glucose, UA: NEGATIVE mg/dL
Ketones, ur: NEGATIVE mg/dL
Nitrite: NEGATIVE
Protein, ur: 100 mg/dL — AB
Specific Gravity, Urine: 1.011 (ref 1.005–1.030)
WBC, UA: >50 WBC/hpf (ref 0–5)
pH: 7 (ref 5.0–8.0)

## 2024-10-16 LAB — COMPREHENSIVE METABOLIC PANEL WITH GFR
ALT: 11 U/L (ref 0–44)
AST: 20 U/L (ref 15–41)
Albumin: 3.6 g/dL (ref 3.5–5.0)
Alkaline Phosphatase: 62 U/L (ref 38–126)
Anion gap: 15 (ref 5–15)
BUN: 31 mg/dL — ABNORMAL HIGH (ref 8–23)
CO2: 26 mmol/L (ref 22–32)
Calcium: 10.1 mg/dL (ref 8.9–10.3)
Chloride: 101 mmol/L (ref 98–111)
Creatinine, Ser: 2.17 mg/dL — ABNORMAL HIGH (ref 0.44–1.00)
GFR, Estimated: 22 mL/min — ABNORMAL LOW (ref >60)
Glucose, Bld: 144 mg/dL — ABNORMAL HIGH (ref 70–99)
Potassium: 4.2 mmol/L (ref 3.5–5.1)
Sodium: 142 mmol/L (ref 135–145)
Total Bilirubin: 0.5 mg/dL (ref 0.0–1.2)
Total Protein: 8.2 g/dL — ABNORMAL HIGH (ref 6.5–8.1)

## 2024-10-16 LAB — CBC
HCT: 40.4 % (ref 36.0–46.0)
Hemoglobin: 13.2 g/dL (ref 12.0–15.0)
MCH: 29.7 pg (ref 26.0–34.0)
MCHC: 32.7 g/dL (ref 30.0–36.0)
MCV: 91 fL (ref 80.0–100.0)
Platelets: 308 K/uL (ref 150–400)
RBC: 4.44 MIL/uL (ref 3.87–5.11)
RDW: 13 % (ref 11.5–15.5)
WBC: 11.8 K/uL — ABNORMAL HIGH (ref 4.0–10.5)
nRBC: 0 % (ref 0.0–0.2)

## 2024-10-16 LAB — TROPONIN I (HIGH SENSITIVITY): Troponin I (High Sensitivity): 7 ng/L (ref <18)

## 2024-10-16 MED ORDER — SODIUM CHLORIDE 0.9 % IV BOLUS
500.0000 mL | Freq: Once | INTRAVENOUS | Status: AC
  Administered 2024-10-16: 500 mL via INTRAVENOUS

## 2024-10-16 NOTE — ED Provider Notes (Signed)
 [facility] Provider Note    Event Date/Time   First MD Initiated Contact with Patient 10/16/24 1520     (approximate)   History   Loss of Consciousness   HPI  Sara Aguirre is a 83 y.o. female with history of hypertension, CVA, bladder cancer, hyperlipidemia, CKD stage IIIb, who presents with a syncopal episode.  The son states that the patient was in the passenger seat in his truck when he stopped to get gas.  He noticed that she did not look right, and subsequently she lost consciousness, her eyes rolled back in her head, and with 1 arm she was reaching up into the air but not responsive.  This lasted for approximately 1 minute, and the patient had a second minute during which she was slumped over and mostly unresponsive.  Then she started to regain consciousness.  She is now back to baseline.  The patient states that she ate breakfast this morning and there have been no changes in her routine.  She states she is feeling fine right now.  She denies any chest pain or difficulty breathing, nausea, vomiting, diarrhea, acute urinary symptoms, or fever.  I reviewed the past medical records.  The patient's most recent outpatient encounter in our system was on 7/16 with Dr. Douglas from nephrology for follow-up of her CKD.   Physical Exam   Triage Vital Signs: ED Triage Vitals  Encounter Vitals Group     BP 10/16/24 1336 120/64     Girls Systolic BP Percentile --      Girls Diastolic BP Percentile --      Boys Systolic BP Percentile --      Boys Diastolic BP Percentile --      Pulse Rate 10/16/24 1336 84     Resp 10/16/24 1336 18     Temp 10/16/24 1336 (!) 97.5 F (36.4 C)     Temp Source 10/16/24 1336 Oral     SpO2 10/16/24 1338 92 %     Weight 10/16/24 1337 151 lb (68.5 kg)     Height 10/16/24 1337 5' 8 (1.727 m)     Head Circumference --      Peak Flow --      Pain Score 10/16/24 1337 0     Pain Loc --      Pain Education --      Exclude from  Growth Chart --     Most recent vital signs: Vitals:   10/16/24 1800 10/16/24 1830  BP: 130/61 (!) 148/63  Pulse: 69 68  Resp: 16 16  Temp:    SpO2: 100% 96%     General: Alert and oriented, well-appearing for age, no distress.  CV:  Good peripheral perfusion.  Resp:  Normal effort.  Lungs CTAB. Abd:  Soft and nontender.  No distention.  Other:  EOMI.  PERRLA.  No photophobia.  No facial droop.  Normal speech.  Motor intact in all extremities.  No ataxia.   ED Results / Procedures / Treatments   Labs (all labs ordered are listed, but only abnormal results are displayed) Labs Reviewed  CBC - Abnormal; Notable for the following components:      Result Value   WBC 11.8 (*)    All other components within normal limits  URINALYSIS, ROUTINE W REFLEX MICROSCOPIC - Abnormal; Notable for the following components:   Color, Urine AMBER (*)    APPearance TURBID (*)    Hgb urine dipstick SMALL (*)  Protein, ur 100 (*)    Leukocytes,Ua SMALL (*)    Bacteria, UA MANY (*)    All other components within normal limits  COMPREHENSIVE METABOLIC PANEL WITH GFR - Abnormal; Notable for the following components:   Glucose, Bld 144 (*)    BUN 31 (*)    Creatinine, Ser 2.17 (*)    Total Protein 8.2 (*)    GFR, Estimated 22 (*)    All other components within normal limits  TROPONIN I (HIGH SENSITIVITY)  TROPONIN I (HIGH SENSITIVITY)     EKG  ED ECG REPORT I, Waylon Cassis, the attending physician, personally viewed and interpreted this ECG.  Date: 10/16/2024 EKG Time: 1342 Rate: 83 Rhythm: normal sinus rhythm QRS Axis: normal Intervals: Incomplete RBBB ST/T Wave abnormalities: normal Narrative Interpretation: no evidence of acute ischemia    RADIOLOGY  CT head: I independently viewed and interpreted the images; there is no ICH.  Radiology report indicates no acute abnormality.  PROCEDURES:  Critical Care performed: No  Procedures   MEDICATIONS ORDERED IN  ED: Medications  sodium chloride  0.9 % bolus 500 mL (0 mLs Intravenous Stopped 10/16/24 1838)     IMPRESSION / MDM / ASSESSMENT AND PLAN / ED COURSE  I reviewed the triage vital signs and the nursing notes.  83 year old female with PMH as noted above presents after syncopal episode witnessed by her son.  The patient was reaching up in the air during the episode, but did not have any convulsions or seizure-like activity.  Currently she is asymptomatic and at baseline mental status.  Her vital signs are normal.  Neurologic exam is nonfocal.  Differential diagnosis includes, but is not limited to, vasovagal episode, dehydration, electrolyte abnormality, other metabolic cause, cardiac dysrhythmia, UTI or other infection, less likely CNS cause.  Will obtain CT head, lab workup, and reassess.  Patient's presentation is most consistent with acute presentation with potential threat to life or bodily function.  The patient is on the cardiac monitor to evaluate for evidence of arrhythmia and/or significant heart rate changes.  ----------------------------------------- 6:46 PM on 10/16/2024 -----------------------------------------  CT head is negative for acute findings.  Labs are reassuring.  Troponin is negative.  CBC and CMP show no acute abnormalities except for possibly very mild AKI compared to the patient's baseline.  I ordered 500 mL of fluids..  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  There are no nitrites.  The leukocytosis is not significant.  There is no clinical evidence for UTI or indication for treatment.  The patient has remained asymptomatic throughout her ED stay, now over 5 hours.  I did consider inpatient admission given her age, and offered this to the patient, however she feels well and would prefer to go home.  I think that this is reasonable given the negative workup.  I gave strict return precautions, she expressed  understanding.   FINAL CLINICAL IMPRESSION(S) / ED DIAGNOSES   Final diagnoses:  Syncope, unspecified syncope type     Rx / DC Orders   ED Discharge Orders     None        Note:  This document was prepared using Dragon voice recognition software and may include unintentional dictation errors.    Cassis Waylon, MD 10/16/24 [phone]

## 2024-10-16 NOTE — ED Triage Notes (Signed)
 Arrived by [facility] from parking lot at [employer] [place]. While in front passenger seat, family wtinessed syncopal episode for about a minute. One episode of emesis with EMS. A&O x4 per EMS since arrival to scene   History stroke and UTI  Takes blood thinners. Denies recent falls  EMS vitals: 76HR 129/67 b/p 96% RA 20RR [id number]

## 2024-10-16 NOTE — ED Triage Notes (Signed)
 Patient to ED via [facility] for syncope- witnessed by family. States she 1 episode of vomiting afterwards. C/o feeling tired since.

## 2024-10-16 NOTE — Discharge Instructions (Signed)
 Return to the ER for new, worsening, recurrent episodes of dizziness, lightheadedness, passing out or nearly passing out, or any other new or worsening symptoms that concern you.  Follow-up with your primary care provider.

## 2024-12-08 ENCOUNTER — Encounter: Payer: Self-pay | Admitting: Pharmacist

## 2024-12-08 NOTE — Progress Notes (Signed)
 Pharmacy Quality Measure Review  This patient is appearing on a report for being at risk of failing the adherence measure for hypertension (ACEi/ARB) medications this calendar year.   Medication: valsartan  160 mg Last fill date: 07/11/24 for 90 day supply  Insurance report was not up to date. No action needed at this time.  Medication has been refilled as of 10/19/24 x90 ds.

## 2024-12-16 ENCOUNTER — Other Ambulatory Visit: Payer: Self-pay | Admitting: Primary Care

## 2024-12-16 DIAGNOSIS — I10 Essential (primary) hypertension: Secondary | ICD-10-CM

## 2024-12-16 NOTE — Telephone Encounter (Signed)
 Patient is due for CPE/follow up in early April 2026, this will be required prior to any further refills.  Please schedule, thank you!

## 2024-12-27 ENCOUNTER — Other Ambulatory Visit: Payer: Self-pay | Admitting: Primary Care

## 2024-12-27 DIAGNOSIS — I639 Cerebral infarction, unspecified: Secondary | ICD-10-CM

## 2024-12-28 NOTE — Telephone Encounter (Signed)
Patient is due for CPE/follow up in early April, this will be required prior to any further refills.  Please schedule, thank you!   

## 2025-01-16 ENCOUNTER — Other Ambulatory Visit: Payer: Self-pay | Admitting: Primary Care

## 2025-01-16 DIAGNOSIS — I10 Essential (primary) hypertension: Secondary | ICD-10-CM

## 2025-03-24 ENCOUNTER — Encounter: Admitting: Primary Care

## 2025-03-27 ENCOUNTER — Ambulatory Visit
